# Patient Record
Sex: Female | Born: 1965 | Race: White | Hispanic: No | Marital: Married | Smoking: Never smoker
Health system: Southern US, Community
[De-identification: ages and names within clinical notes are randomized; demographics above are authoritative.]

## PROBLEM LIST (undated history)

## (undated) ENCOUNTER — Emergency Department (HOSPITAL_BASED_OUTPATIENT_CLINIC_OR_DEPARTMENT_OTHER): Admission: EM | Payer: 59 | Source: Home / Self Care

## (undated) DIAGNOSIS — R42 Dizziness and giddiness: Secondary | ICD-10-CM

## (undated) DIAGNOSIS — E785 Hyperlipidemia, unspecified: Secondary | ICD-10-CM

## (undated) DIAGNOSIS — T7840XA Allergy, unspecified, initial encounter: Secondary | ICD-10-CM

## (undated) DIAGNOSIS — R002 Palpitations: Secondary | ICD-10-CM

## (undated) DIAGNOSIS — K589 Irritable bowel syndrome without diarrhea: Secondary | ICD-10-CM

## (undated) DIAGNOSIS — I491 Atrial premature depolarization: Secondary | ICD-10-CM

## (undated) DIAGNOSIS — B019 Varicella without complication: Secondary | ICD-10-CM

## (undated) DIAGNOSIS — L309 Dermatitis, unspecified: Secondary | ICD-10-CM

## (undated) DIAGNOSIS — Z86010 Personal history of colonic polyps: Secondary | ICD-10-CM

## (undated) DIAGNOSIS — D131 Benign neoplasm of stomach: Secondary | ICD-10-CM

## (undated) DIAGNOSIS — F341 Dysthymic disorder: Secondary | ICD-10-CM

## (undated) DIAGNOSIS — I10 Essential (primary) hypertension: Secondary | ICD-10-CM

## (undated) DIAGNOSIS — K219 Gastro-esophageal reflux disease without esophagitis: Secondary | ICD-10-CM

## (undated) DIAGNOSIS — K30 Functional dyspepsia: Secondary | ICD-10-CM

## (undated) DIAGNOSIS — G4700 Insomnia, unspecified: Secondary | ICD-10-CM

## (undated) DIAGNOSIS — R7309 Other abnormal glucose: Secondary | ICD-10-CM

## (undated) DIAGNOSIS — M7700 Medial epicondylitis, unspecified elbow: Secondary | ICD-10-CM

## (undated) HISTORY — DX: Benign neoplasm of stomach: D13.1

## (undated) HISTORY — DX: Other abnormal glucose: R73.09

## (undated) HISTORY — DX: Varicella without complication: B01.9

## (undated) HISTORY — DX: Atrial premature depolarization: I49.1

## (undated) HISTORY — PX: COLONOSCOPY: SHX174

## (undated) HISTORY — DX: Essential (primary) hypertension: I10

## (undated) HISTORY — DX: Hyperlipidemia, unspecified: E78.5

## (undated) HISTORY — DX: Insomnia, unspecified: G47.00

## (undated) HISTORY — DX: Personal history of colonic polyps: Z86.010

## (undated) HISTORY — DX: Functional dyspepsia: K30

## (undated) HISTORY — DX: Gastro-esophageal reflux disease without esophagitis: K21.9

## (undated) HISTORY — DX: Palpitations: R00.2

## (undated) HISTORY — DX: Medial epicondylitis, unspecified elbow: M77.00

## (undated) HISTORY — PX: EYE SURGERY: SHX253

## (undated) HISTORY — PX: ESOPHAGOGASTRODUODENOSCOPY: SHX1529

## (undated) HISTORY — DX: Irritable bowel syndrome, unspecified: K58.9

## (undated) HISTORY — DX: Dermatitis, unspecified: L30.9

## (undated) HISTORY — DX: Dizziness and giddiness: R42

## (undated) HISTORY — DX: Allergy, unspecified, initial encounter: T78.40XA

## (undated) HISTORY — DX: Dysthymic disorder: F34.1

---

## 1998-11-02 HISTORY — PX: DE QUERVAIN'S RELEASE: SHX1439

## 2004-06-03 ENCOUNTER — Ambulatory Visit (HOSPITAL_COMMUNITY): Admission: RE | Admit: 2004-06-03 | Discharge: 2004-06-03 | Payer: Self-pay | Admitting: Cardiology

## 2004-06-03 ENCOUNTER — Encounter: Payer: Self-pay | Admitting: Cardiovascular Disease

## 2004-06-12 ENCOUNTER — Ambulatory Visit (HOSPITAL_COMMUNITY): Admission: RE | Admit: 2004-06-12 | Discharge: 2004-06-12 | Payer: Self-pay | Admitting: Physician Assistant

## 2004-09-05 ENCOUNTER — Ambulatory Visit: Payer: Self-pay | Admitting: Internal Medicine

## 2004-09-09 ENCOUNTER — Encounter: Payer: Self-pay | Admitting: Internal Medicine

## 2004-09-09 ENCOUNTER — Ambulatory Visit (HOSPITAL_COMMUNITY): Admission: RE | Admit: 2004-09-09 | Discharge: 2004-09-09 | Payer: Self-pay | Admitting: Internal Medicine

## 2005-01-24 ENCOUNTER — Ambulatory Visit (HOSPITAL_COMMUNITY): Admission: RE | Admit: 2005-01-24 | Discharge: 2005-01-24 | Payer: Self-pay | Admitting: Orthopedic Surgery

## 2005-08-04 ENCOUNTER — Ambulatory Visit (HOSPITAL_COMMUNITY): Admission: RE | Admit: 2005-08-04 | Discharge: 2005-08-04 | Payer: Self-pay | Admitting: Ophthalmology

## 2005-10-13 ENCOUNTER — Encounter: Admission: RE | Admit: 2005-10-13 | Discharge: 2005-12-01 | Payer: Self-pay | Admitting: Orthopedic Surgery

## 2006-05-16 ENCOUNTER — Emergency Department (HOSPITAL_COMMUNITY): Admission: EM | Admit: 2006-05-16 | Discharge: 2006-05-16 | Payer: Self-pay | Admitting: Family Medicine

## 2006-10-07 ENCOUNTER — Encounter: Admission: RE | Admit: 2006-10-07 | Discharge: 2006-10-07 | Payer: Self-pay | Admitting: Obstetrics and Gynecology

## 2007-04-15 ENCOUNTER — Encounter: Admission: RE | Admit: 2007-04-15 | Discharge: 2007-04-15 | Payer: Self-pay | Admitting: Occupational Medicine

## 2007-08-03 ENCOUNTER — Ambulatory Visit (HOSPITAL_COMMUNITY): Admission: RE | Admit: 2007-08-03 | Discharge: 2007-08-03 | Payer: Self-pay | Admitting: Ophthalmology

## 2007-10-08 ENCOUNTER — Emergency Department (HOSPITAL_COMMUNITY): Admission: EM | Admit: 2007-10-08 | Discharge: 2007-10-08 | Payer: Self-pay | Admitting: Emergency Medicine

## 2007-11-09 ENCOUNTER — Encounter: Admission: RE | Admit: 2007-11-09 | Discharge: 2007-11-09 | Payer: Self-pay | Admitting: Obstetrics and Gynecology

## 2007-12-05 ENCOUNTER — Emergency Department (HOSPITAL_COMMUNITY): Admission: EM | Admit: 2007-12-05 | Discharge: 2007-12-05 | Payer: Self-pay | Admitting: Family Medicine

## 2008-06-01 ENCOUNTER — Ambulatory Visit: Payer: Self-pay | Admitting: Internal Medicine

## 2008-06-04 LAB — CONVERTED CEMR LAB
ALT: 16 units/L (ref 0–35)
AST: 19 units/L (ref 0–37)
Albumin: 3.7 g/dL (ref 3.5–5.2)
Alkaline Phosphatase: 51 units/L (ref 39–117)
Amylase: 112 units/L (ref 27–131)
BUN: 12 mg/dL (ref 6–23)
Basophils Absolute: 0 10*3/uL (ref 0.0–0.1)
Basophils Relative: 0.3 % (ref 0.0–3.0)
CO2: 29 meq/L (ref 19–32)
Calcium: 9 mg/dL (ref 8.4–10.5)
Chloride: 103 meq/L (ref 96–112)
Creatinine, Ser: 0.8 mg/dL (ref 0.4–1.2)
Eosinophils Absolute: 0.1 10*3/uL (ref 0.0–0.7)
Eosinophils Relative: 1.7 % (ref 0.0–5.0)
GFR calc Af Amer: 100 mL/min
GFR calc non Af Amer: 83 mL/min
Glucose, Bld: 113 mg/dL — ABNORMAL HIGH (ref 70–99)
HCT: 40.7 % (ref 36.0–46.0)
Hemoglobin: 14 g/dL (ref 12.0–15.0)
Lipase: 35 units/L (ref 11.0–59.0)
Lymphocytes Relative: 41 % (ref 12.0–46.0)
MCHC: 34.4 g/dL (ref 30.0–36.0)
MCV: 91.3 fL (ref 78.0–100.0)
Monocytes Absolute: 0.4 10*3/uL (ref 0.1–1.0)
Monocytes Relative: 7 % (ref 3.0–12.0)
Neutro Abs: 2.9 10*3/uL (ref 1.4–7.7)
Neutrophils Relative %: 50 % (ref 43.0–77.0)
Platelets: 304 10*3/uL (ref 150–400)
Potassium: 3.7 meq/L (ref 3.5–5.1)
RBC: 4.45 M/uL (ref 3.87–5.11)
RDW: 12 % (ref 11.5–14.6)
Sodium: 138 meq/L (ref 135–145)
TSH: 0.75 microintl units/mL (ref 0.35–5.50)
Total Bilirubin: 0.5 mg/dL (ref 0.3–1.2)
Total Protein: 7 g/dL (ref 6.0–8.3)
WBC: 5.7 10*3/uL (ref 4.5–10.5)

## 2008-06-06 ENCOUNTER — Ambulatory Visit: Payer: Self-pay | Admitting: Internal Medicine

## 2008-06-06 LAB — CONVERTED CEMR LAB: IgA: 229 mg/dL (ref 68–378)

## 2008-06-13 LAB — CONVERTED CEMR LAB: Tissue Transglutaminase Ab, IgA: 0.3 units (ref <7)

## 2008-07-16 ENCOUNTER — Ambulatory Visit: Payer: Self-pay | Admitting: Internal Medicine

## 2008-07-26 ENCOUNTER — Encounter: Payer: Self-pay | Admitting: Internal Medicine

## 2008-07-26 ENCOUNTER — Ambulatory Visit (HOSPITAL_BASED_OUTPATIENT_CLINIC_OR_DEPARTMENT_OTHER): Admission: RE | Admit: 2008-07-26 | Discharge: 2008-07-26 | Payer: Self-pay | Admitting: Family Medicine

## 2008-07-27 ENCOUNTER — Encounter: Payer: Self-pay | Admitting: Internal Medicine

## 2008-08-03 ENCOUNTER — Ambulatory Visit (HOSPITAL_BASED_OUTPATIENT_CLINIC_OR_DEPARTMENT_OTHER): Admission: RE | Admit: 2008-08-03 | Discharge: 2008-08-03 | Payer: Self-pay | Admitting: Internal Medicine

## 2008-08-03 ENCOUNTER — Telehealth: Payer: Self-pay | Admitting: Internal Medicine

## 2008-08-06 ENCOUNTER — Ambulatory Visit: Payer: Self-pay | Admitting: Internal Medicine

## 2008-08-06 DIAGNOSIS — R7309 Other abnormal glucose: Secondary | ICD-10-CM | POA: Insufficient documentation

## 2008-08-06 DIAGNOSIS — E785 Hyperlipidemia, unspecified: Secondary | ICD-10-CM | POA: Insufficient documentation

## 2008-08-06 LAB — CONVERTED CEMR LAB: CA 125: 7.1 units/mL (ref 0.0–30.2)

## 2008-08-07 ENCOUNTER — Ambulatory Visit: Payer: Self-pay | Admitting: Internal Medicine

## 2008-08-07 ENCOUNTER — Telehealth: Payer: Self-pay | Admitting: Internal Medicine

## 2008-08-07 LAB — CONVERTED CEMR LAB
Cholesterol: 188 mg/dL (ref 0–200)
HDL: 44.9 mg/dL (ref >39.0)
Hgb A1c MFr Bld: 5.3 % (ref 4.6–6.0)
LDL Cholesterol: 121 mg/dL — ABNORMAL HIGH (ref 0–99)
Total CHOL/HDL Ratio: 4.2
Triglycerides: 110 mg/dL (ref 0–149)
VLDL: 22 mg/dL (ref 0–40)

## 2008-08-14 ENCOUNTER — Ambulatory Visit: Payer: Self-pay | Admitting: Internal Medicine

## 2008-08-14 DIAGNOSIS — K7689 Other specified diseases of liver: Secondary | ICD-10-CM | POA: Insufficient documentation

## 2008-08-14 DIAGNOSIS — G4700 Insomnia, unspecified: Secondary | ICD-10-CM | POA: Insufficient documentation

## 2008-08-27 LAB — CONVERTED CEMR LAB: Pap Smear: NORMAL

## 2008-09-21 ENCOUNTER — Ambulatory Visit: Payer: Self-pay | Admitting: Internal Medicine

## 2008-09-21 DIAGNOSIS — R51 Headache: Secondary | ICD-10-CM | POA: Insufficient documentation

## 2008-09-21 DIAGNOSIS — R519 Headache, unspecified: Secondary | ICD-10-CM | POA: Insufficient documentation

## 2008-09-24 ENCOUNTER — Ambulatory Visit: Payer: Self-pay | Admitting: Internal Medicine

## 2008-09-24 DIAGNOSIS — R42 Dizziness and giddiness: Secondary | ICD-10-CM | POA: Insufficient documentation

## 2008-11-27 ENCOUNTER — Ambulatory Visit: Payer: Self-pay | Admitting: Diagnostic Radiology

## 2008-11-27 ENCOUNTER — Ambulatory Visit (HOSPITAL_BASED_OUTPATIENT_CLINIC_OR_DEPARTMENT_OTHER): Admission: RE | Admit: 2008-11-27 | Discharge: 2008-11-27 | Payer: Self-pay | Admitting: Obstetrics and Gynecology

## 2008-12-21 ENCOUNTER — Ambulatory Visit: Payer: Self-pay | Admitting: *Deleted

## 2008-12-21 ENCOUNTER — Ambulatory Visit: Payer: Self-pay | Admitting: Diagnostic Radiology

## 2008-12-21 ENCOUNTER — Ambulatory Visit (HOSPITAL_BASED_OUTPATIENT_CLINIC_OR_DEPARTMENT_OTHER): Admission: RE | Admit: 2008-12-21 | Discharge: 2008-12-21 | Payer: Self-pay | Admitting: *Deleted

## 2008-12-21 DIAGNOSIS — H5310 Unspecified subjective visual disturbances: Secondary | ICD-10-CM | POA: Insufficient documentation

## 2008-12-21 LAB — CONVERTED CEMR LAB
BUN: 13 mg/dL (ref 6–23)
Basophils Absolute: 0 10*3/uL (ref 0.0–0.1)
Basophils Relative: 0.3 % (ref 0.0–3.0)
CO2: 28 meq/L (ref 19–32)
Calcium: 8.8 mg/dL (ref 8.4–10.5)
Chloride: 105 meq/L (ref 96–112)
Creatinine, Ser: 0.6 mg/dL (ref 0.4–1.2)
Eosinophils Absolute: 0.1 10*3/uL (ref 0.0–0.7)
Eosinophils Relative: 1.9 % (ref 0.0–5.0)
GFR calc Af Amer: 140 mL/min
GFR calc non Af Amer: 115 mL/min
Glucose, Bld: 157 mg/dL — ABNORMAL HIGH (ref 70–99)
HCT: 40.1 % (ref 36.0–46.0)
Hemoglobin: 13.8 g/dL (ref 12.0–15.0)
Lymphocytes Relative: 41.7 % (ref 12.0–46.0)
MCHC: 34.5 g/dL (ref 30.0–36.0)
MCV: 90.8 fL (ref 78.0–100.0)
Monocytes Absolute: 0.5 10*3/uL (ref 0.1–1.0)
Monocytes Relative: 7.8 % (ref 3.0–12.0)
Neutro Abs: 3.3 10*3/uL (ref 1.4–7.7)
Neutrophils Relative %: 48.3 % (ref 43.0–77.0)
Platelets: 260 10*3/uL (ref 150–400)
Potassium: 3.8 meq/L (ref 3.5–5.1)
RBC: 4.42 M/uL (ref 3.87–5.11)
RDW: 11.9 % (ref 11.5–14.6)
Sodium: 140 meq/L (ref 135–145)
TSH: 0.9 microintl units/mL (ref 0.35–5.50)
WBC: 6.7 10*3/uL (ref 4.5–10.5)

## 2008-12-24 ENCOUNTER — Ambulatory Visit: Payer: Self-pay | Admitting: Internal Medicine

## 2008-12-24 DIAGNOSIS — M542 Cervicalgia: Secondary | ICD-10-CM | POA: Insufficient documentation

## 2008-12-31 ENCOUNTER — Encounter: Admission: RE | Admit: 2008-12-31 | Discharge: 2009-01-23 | Payer: Self-pay | Admitting: Internal Medicine

## 2009-01-23 ENCOUNTER — Encounter: Payer: Self-pay | Admitting: Internal Medicine

## 2009-02-18 ENCOUNTER — Ambulatory Visit: Payer: Self-pay | Admitting: Internal Medicine

## 2009-02-18 LAB — CONVERTED CEMR LAB
BUN: 9 mg/dL (ref 6–23)
CO2: 30 meq/L (ref 19–32)
Calcium: 9.1 mg/dL (ref 8.4–10.5)
Chloride: 110 meq/L (ref 96–112)
Creatinine, Ser: 0.7 mg/dL (ref 0.4–1.2)
GFR calc non Af Amer: 96.11 mL/min (ref >60)
Glucose, Bld: 87 mg/dL (ref 70–99)
Hgb A1c MFr Bld: 5.5 % (ref 4.6–6.5)
Potassium: 4.4 meq/L (ref 3.5–5.1)
Sodium: 144 meq/L (ref 135–145)

## 2009-02-22 ENCOUNTER — Ambulatory Visit: Payer: Self-pay | Admitting: Internal Medicine

## 2009-06-18 ENCOUNTER — Ambulatory Visit: Payer: Self-pay | Admitting: Internal Medicine

## 2009-06-18 DIAGNOSIS — R002 Palpitations: Secondary | ICD-10-CM | POA: Insufficient documentation

## 2009-06-19 ENCOUNTER — Encounter: Payer: Self-pay | Admitting: Internal Medicine

## 2009-06-19 LAB — CONVERTED CEMR LAB
BUN: 15 mg/dL (ref 6–23)
Basophils Absolute: 0 10*3/uL (ref 0.0–0.1)
Basophils Relative: 0 % (ref 0–1)
CO2: 24 meq/L (ref 19–32)
Calcium: 9.2 mg/dL (ref 8.4–10.5)
Chloride: 104 meq/L (ref 96–112)
Creatinine, Ser: 0.82 mg/dL (ref 0.40–1.20)
Eosinophils Absolute: 0.1 10*3/uL (ref 0.0–0.7)
Eosinophils Relative: 1 % (ref 0–5)
Free T4: 0.98 ng/dL (ref 0.80–1.80)
Glucose, Bld: 93 mg/dL (ref 70–99)
HCT: 42.8 % (ref 36.0–46.0)
Hemoglobin: 13.8 g/dL (ref 12.0–15.0)
Lymphocytes Relative: 32 % (ref 12–46)
Lymphs Abs: 2.9 10*3/uL (ref 0.7–4.0)
MCHC: 32.2 g/dL (ref 30.0–36.0)
MCV: 93.4 fL (ref 78.0–100.0)
Monocytes Absolute: 0.4 10*3/uL (ref 0.1–1.0)
Monocytes Relative: 5 % (ref 3–12)
Neutro Abs: 5.5 10*3/uL (ref 1.7–7.7)
Neutrophils Relative %: 62 % (ref 43–77)
Platelets: 311 10*3/uL (ref 150–400)
Potassium: 4.1 meq/L (ref 3.5–5.3)
RBC: 4.58 M/uL (ref 3.87–5.11)
RDW: 12.7 % (ref 11.5–15.5)
Sodium: 138 meq/L (ref 135–145)
TSH: 0.948 microintl units/mL (ref 0.350–4.500)
WBC: 8.9 10*3/uL (ref 4.0–10.5)

## 2009-06-24 ENCOUNTER — Telehealth: Payer: Self-pay | Admitting: Internal Medicine

## 2009-06-28 ENCOUNTER — Ambulatory Visit: Payer: Self-pay

## 2009-06-28 ENCOUNTER — Encounter: Payer: Self-pay | Admitting: Internal Medicine

## 2009-06-30 ENCOUNTER — Telehealth: Payer: Self-pay | Admitting: Internal Medicine

## 2009-07-19 ENCOUNTER — Ambulatory Visit: Payer: Self-pay | Admitting: Internal Medicine

## 2009-07-19 DIAGNOSIS — R0789 Other chest pain: Secondary | ICD-10-CM | POA: Insufficient documentation

## 2009-08-07 ENCOUNTER — Encounter: Payer: Self-pay | Admitting: Cardiology

## 2009-08-22 ENCOUNTER — Ambulatory Visit (HOSPITAL_COMMUNITY): Admission: RE | Admit: 2009-08-22 | Discharge: 2009-08-22 | Payer: Self-pay | Admitting: Internal Medicine

## 2009-08-22 ENCOUNTER — Telehealth: Payer: Self-pay | Admitting: Internal Medicine

## 2009-09-12 ENCOUNTER — Telehealth (INDEPENDENT_AMBULATORY_CARE_PROVIDER_SITE_OTHER): Payer: Self-pay | Admitting: *Deleted

## 2009-09-13 LAB — CONVERTED CEMR LAB: Pap Smear: NORMAL

## 2009-09-18 ENCOUNTER — Encounter: Payer: Self-pay | Admitting: Cardiology

## 2009-09-18 ENCOUNTER — Ambulatory Visit: Payer: Self-pay | Admitting: Cardiology

## 2009-09-30 ENCOUNTER — Ambulatory Visit: Payer: Self-pay | Admitting: Internal Medicine

## 2009-09-30 DIAGNOSIS — F341 Dysthymic disorder: Secondary | ICD-10-CM | POA: Insufficient documentation

## 2009-10-10 ENCOUNTER — Ambulatory Visit: Payer: Self-pay

## 2009-10-10 ENCOUNTER — Ambulatory Visit (HOSPITAL_COMMUNITY): Admission: RE | Admit: 2009-10-10 | Discharge: 2009-10-10 | Payer: Self-pay | Admitting: Cardiology

## 2009-10-10 ENCOUNTER — Ambulatory Visit: Payer: Self-pay | Admitting: Cardiology

## 2009-10-10 ENCOUNTER — Encounter: Payer: Self-pay | Admitting: Cardiology

## 2009-10-14 ENCOUNTER — Telehealth (INDEPENDENT_AMBULATORY_CARE_PROVIDER_SITE_OTHER): Payer: Self-pay | Admitting: *Deleted

## 2009-11-20 ENCOUNTER — Encounter: Payer: Self-pay | Admitting: Cardiology

## 2009-11-20 ENCOUNTER — Ambulatory Visit: Payer: Self-pay | Admitting: Cardiology

## 2009-11-20 DIAGNOSIS — R0602 Shortness of breath: Secondary | ICD-10-CM | POA: Insufficient documentation

## 2009-11-21 ENCOUNTER — Ambulatory Visit: Payer: Self-pay | Admitting: Family Medicine

## 2009-11-25 ENCOUNTER — Ambulatory Visit: Payer: Self-pay | Admitting: Internal Medicine

## 2009-11-26 ENCOUNTER — Telehealth: Payer: Self-pay | Admitting: Internal Medicine

## 2009-12-05 ENCOUNTER — Encounter: Admission: RE | Admit: 2009-12-05 | Discharge: 2010-01-28 | Payer: Self-pay | Admitting: Internal Medicine

## 2009-12-09 ENCOUNTER — Encounter: Payer: Self-pay | Admitting: Internal Medicine

## 2009-12-27 ENCOUNTER — Ambulatory Visit: Payer: Self-pay | Admitting: Radiology

## 2009-12-27 ENCOUNTER — Ambulatory Visit (HOSPITAL_BASED_OUTPATIENT_CLINIC_OR_DEPARTMENT_OTHER): Admission: RE | Admit: 2009-12-27 | Discharge: 2009-12-27 | Payer: Self-pay | Admitting: Obstetrics and Gynecology

## 2009-12-27 LAB — HM MAMMOGRAPHY: HM Mammogram: NEGATIVE

## 2010-02-03 ENCOUNTER — Telehealth: Payer: Self-pay | Admitting: Internal Medicine

## 2010-03-22 ENCOUNTER — Ambulatory Visit: Payer: Self-pay | Admitting: Family Medicine

## 2010-03-22 LAB — CONVERTED CEMR LAB: Rapid Strep: NEGATIVE

## 2010-03-26 ENCOUNTER — Telehealth (INDEPENDENT_AMBULATORY_CARE_PROVIDER_SITE_OTHER): Payer: Self-pay | Admitting: *Deleted

## 2010-03-27 ENCOUNTER — Ambulatory Visit: Payer: Self-pay | Admitting: Internal Medicine

## 2010-05-06 ENCOUNTER — Encounter: Payer: Self-pay | Admitting: Internal Medicine

## 2010-05-20 ENCOUNTER — Ambulatory Visit: Payer: Self-pay | Admitting: Internal Medicine

## 2010-05-21 ENCOUNTER — Telehealth: Payer: Self-pay | Admitting: Internal Medicine

## 2010-05-21 ENCOUNTER — Encounter: Payer: Self-pay | Admitting: Gastroenterology

## 2010-05-26 ENCOUNTER — Encounter: Payer: Self-pay | Admitting: Internal Medicine

## 2010-05-27 ENCOUNTER — Ambulatory Visit (HOSPITAL_COMMUNITY): Admission: RE | Admit: 2010-05-27 | Discharge: 2010-05-27 | Payer: Self-pay | Admitting: Internal Medicine

## 2010-05-27 ENCOUNTER — Telehealth: Payer: Self-pay | Admitting: Internal Medicine

## 2010-06-03 ENCOUNTER — Telehealth: Payer: Self-pay | Admitting: Internal Medicine

## 2010-06-06 ENCOUNTER — Ambulatory Visit (HOSPITAL_COMMUNITY): Admission: RE | Admit: 2010-06-06 | Discharge: 2010-06-06 | Payer: Self-pay | Admitting: Internal Medicine

## 2010-06-09 ENCOUNTER — Telehealth: Payer: Self-pay | Admitting: Internal Medicine

## 2010-06-11 ENCOUNTER — Encounter: Payer: Self-pay | Admitting: Internal Medicine

## 2010-06-11 LAB — CONVERTED CEMR LAB
ALT: 14 units/L (ref 0–35)
AST: 14 units/L (ref 0–37)
Albumin: 4.2 g/dL (ref 3.5–5.2)
Alkaline Phosphatase: 56 units/L (ref 39–117)
BUN: 12 mg/dL (ref 6–23)
Basophils Absolute: 0 10*3/uL (ref 0.0–0.1)
Basophils Relative: 0 % (ref 0–1)
Bilirubin, Direct: 0.1 mg/dL (ref 0.0–0.3)
CO2: 21 meq/L (ref 19–32)
Calcium: 9 mg/dL (ref 8.4–10.5)
Chloride: 107 meq/L (ref 96–112)
Cholesterol: 187 mg/dL (ref 0–200)
Creatinine, Ser: 0.69 mg/dL (ref 0.40–1.20)
Eosinophils Absolute: 0.1 10*3/uL (ref 0.0–0.7)
Eosinophils Relative: 1 % (ref 0–5)
Glucose, Bld: 84 mg/dL (ref 70–99)
HCT: 43.3 % (ref 36.0–46.0)
HDL: 52 mg/dL (ref >39)
Hemoglobin: 14.1 g/dL (ref 12.0–15.0)
Indirect Bilirubin: 0.3 mg/dL (ref 0.0–0.9)
LDL Cholesterol: 113 mg/dL — ABNORMAL HIGH (ref 0–99)
Lymphocytes Relative: 31 % (ref 12–46)
Lymphs Abs: 2.6 10*3/uL (ref 0.7–4.0)
MCHC: 32.6 g/dL (ref 30.0–36.0)
MCV: 91.7 fL (ref 78.0–100.0)
Monocytes Absolute: 0.5 10*3/uL (ref 0.1–1.0)
Monocytes Relative: 6 % (ref 3–12)
Neutro Abs: 5.1 10*3/uL (ref 1.7–7.7)
Neutrophils Relative %: 62 % (ref 43–77)
Platelets: 283 10*3/uL (ref 150–400)
Potassium: 4 meq/L (ref 3.5–5.3)
RBC: 4.72 M/uL (ref 3.87–5.11)
RDW: 12.9 % (ref 11.5–15.5)
Sodium: 140 meq/L (ref 135–145)
TSH: 0.887 microintl units/mL (ref 0.350–4.500)
Total Bilirubin: 0.4 mg/dL (ref 0.3–1.2)
Total CHOL/HDL Ratio: 3.6
Total Protein: 7 g/dL (ref 6.0–8.3)
Triglycerides: 109 mg/dL (ref <150)
VLDL: 22 mg/dL (ref 0–40)
WBC: 8.2 10*3/uL (ref 4.0–10.5)

## 2010-06-24 ENCOUNTER — Ambulatory Visit: Payer: Self-pay | Admitting: Internal Medicine

## 2010-06-24 DIAGNOSIS — M5430 Sciatica, unspecified side: Secondary | ICD-10-CM | POA: Insufficient documentation

## 2010-07-15 ENCOUNTER — Ambulatory Visit: Payer: Self-pay | Admitting: Internal Medicine

## 2010-07-15 DIAGNOSIS — M76899 Other specified enthesopathies of unspecified lower limb, excluding foot: Secondary | ICD-10-CM | POA: Insufficient documentation

## 2010-07-17 ENCOUNTER — Ambulatory Visit: Payer: Self-pay | Admitting: Family Medicine

## 2010-07-17 DIAGNOSIS — M7700 Medial epicondylitis, unspecified elbow: Secondary | ICD-10-CM

## 2010-07-17 HISTORY — DX: Medial epicondylitis, unspecified elbow: M77.00

## 2010-08-12 ENCOUNTER — Telehealth: Payer: Self-pay | Admitting: Internal Medicine

## 2010-08-12 ENCOUNTER — Ambulatory Visit (HOSPITAL_COMMUNITY): Admission: RE | Admit: 2010-08-12 | Discharge: 2010-08-12 | Payer: Self-pay | Admitting: Internal Medicine

## 2010-09-18 ENCOUNTER — Encounter: Payer: Self-pay | Admitting: Family Medicine

## 2010-11-23 ENCOUNTER — Encounter: Payer: Self-pay | Admitting: Internal Medicine

## 2010-11-24 ENCOUNTER — Encounter: Payer: Self-pay | Admitting: Internal Medicine

## 2010-11-30 LAB — CONVERTED CEMR LAB
ALT: 13 units/L (ref 0–35)
AST: 17 units/L (ref 0–37)
Albumin: 4.4 g/dL (ref 3.5–5.2)
Alkaline Phosphatase: 61 units/L (ref 39–117)
BUN: 10 mg/dL (ref 6–23)
Basophils Absolute: 0 10*3/uL (ref 0.0–0.1)
Basophils Relative: 0 % (ref 0–1)
Bilirubin, Direct: <0.1 mg/dL (ref 0.0–0.3)
CO2: 27 meq/L (ref 19–32)
Calcium: 8.9 mg/dL (ref 8.4–10.5)
Chloride: 103 meq/L (ref 96–112)
Creatinine, Ser: 0.75 mg/dL (ref 0.40–1.20)
Eosinophils Absolute: 0.1 10*3/uL (ref 0.0–0.7)
Eosinophils Relative: 2 % (ref 0–5)
Glucose, Bld: 128 mg/dL — ABNORMAL HIGH (ref 70–99)
HCT: 42.8 % (ref 36.0–46.0)
Hemoglobin: 14.1 g/dL (ref 12.0–15.0)
Lipase: 39 units/L (ref 0–75)
Lymphocytes Relative: 43 % (ref 12–46)
Lymphs Abs: 3 10*3/uL (ref 0.7–4.0)
MCHC: 32.9 g/dL (ref 30.0–36.0)
MCV: 93.7 fL (ref 78.0–100.0)
Monocytes Absolute: 0.4 10*3/uL (ref 0.1–1.0)
Monocytes Relative: 6 % (ref 3–12)
Neutro Abs: 3.4 10*3/uL (ref 1.7–7.7)
Neutrophils Relative %: 49 % (ref 43–77)
Platelets: 321 10*3/uL (ref 150–400)
Potassium: 4 meq/L (ref 3.5–5.3)
RBC: 4.57 M/uL (ref 3.87–5.11)
RDW: 12.9 % (ref 11.5–15.5)
Sodium: 139 meq/L (ref 135–145)
Total Bilirubin: 0.3 mg/dL (ref 0.3–1.2)
Total Protein: 7.7 g/dL (ref 6.0–8.3)
WBC: 6.9 10*3/uL (ref 4.0–10.5)

## 2010-12-03 NOTE — Assessment & Plan Note (Signed)
Summary: Sorethroat x 2 dys rm 2   Vital Signs:  Patient Profile:   45 Years Old Female CC:      Cold & URI symptoms Height:     61 inches Weight:      137 pounds O2 Sat:      100 % O2 treatment:    Room Air Temp:     99.3 degrees F oral Pulse rate:   88 / minute Pulse rhythm:   regular Resp:     16 per minute BP sitting:   144 / 92  (right arm) Cuff size:   regular  Vitals Entered By: Areta Haber CMA (Mar 22, 2010 5:11 PM)                  Prior Medication List:  MULTIVITAMINS   TABS (MULTIPLE VITAMIN) once daily NUVARING 0.12-0.015 MG/24HR  RING (ETONOGESTREL-ETHINYL ESTRADIOL) as directed CALCIUM-VITAMIN D 500-200 MG-UNIT TABS (CALCIUM CARBONATE-VITAMIN D) Take 1 tablet by mouth once a day NEXIUM 40 MG CPDR (ESOMEPRAZOLE MAGNESIUM) one by mouth once daily HYOMAX-SR 0.375 MG XR12H-TAB (HYOSCYAMINE SULFATE) one po bid as needed AMITRIPTYLINE HCL 25 MG TABS (AMITRIPTYLINE HCL) 1/2 to one tab by mouth at bedtime prn AMOXICILLIN 875 MG TABS (AMOXICILLIN) 1 by mouth TID XIFAXAN 550 MG TABS (RIFAXIMIN) one by mouth two times a day   Current Allergies (reviewed today): ! SULFA      History of Present Illness Chief Complaint: Cold & URI symptoms History of Present Illness: Sore throat for 2 days now w/nasal congestion . She felt worse today after taking a nap and saw some white spots on her tonsil and came in to be evaluated.   Current Problems: UPPER RESPIRATORY INFECTION, ACUTE (ICD-465.9) ACUTE NASOPHARYNGITIS (ICD-460) DYSPNEA (ICD-786.05) DYSTHYMIA (ICD-300.4) HIP PAIN, LEFT (ICD-719.45) HYPERLIPIDEMIA (ICD-272.4) CHEST PAIN, ATYPICAL (ICD-786.59) PALPITATIONS (ICD-785.1) NECK PAIN (ICD-723.1) ELEVATED BP READING WITHOUT DX HYPERTENSION (ICD-796.2) UNSPECIFIED SUBJECTIVE VISUAL DISTURBANCE (ICD-368.10) ENCOUNTER FOR LONG-TERM USE OF OTHER MEDICATIONS (ICD-V58.69) DIZZINESS (ICD-780.4) HEADACHE (ICD-784.0) INSOMNIA (ICD-780.52) HEPATIC CYST  (ICD-573.8) OTHER ABNORMAL GLUCOSE (ICD-790.29) GERD (ICD-530.81) HEALTH MAINTENANCE EXAM (ICD-V70.0) ABDOMINAL PAIN, UNSPECIFIED SITE (ICD-789.00) IRRITABLE BOWEL SYNDROME (ICD-564.1) ABDOMINAL BLOATING (ICD-787.3) ABDOMINAL PAIN, EPIGASTRIC (ICD-789.06) HIATAL HERNIA (ICD-553.3)   Current Meds MULTIVITAMINS   TABS (MULTIPLE VITAMIN) once daily NUVARING 0.12-0.015 MG/24HR  RING (ETONOGESTREL-ETHINYL ESTRADIOL) as directed NEXIUM 40 MG CPDR (ESOMEPRAZOLE MAGNESIUM) one by mouth once daily AMITRIPTYLINE HCL 25 MG TABS (AMITRIPTYLINE HCL) 1/2 to one tab by mouth at bedtime prn ADVIL 200 MG TABS (IBUPROFEN) as needed TYLENOL 325 MG TABS (ACETAMINOPHEN) as needed  REVIEW OF SYSTEMS Constitutional Symptoms      Denies fever, chills, night sweats, weight loss, weight gain, and fatigue.  Eyes       Denies change in vision, eye pain, eye discharge, glasses, contact lenses, and eye surgery. Ear/Nose/Throat/Mouth       Complains of sinus problems and sore throat.      Denies hearing loss/aids, change in hearing, ear pain, ear discharge, dizziness, frequent runny nose, frequent nose bleeds, hoarseness, and tooth pain or bleeding.      Comments: x 2 dys  Respiratory       Denies dry cough, productive cough, wheezing, shortness of breath, asthma, bronchitis, and emphysema/COPD.  Cardiovascular       Denies murmurs, chest pain, and tires easily with exhertion.    Gastrointestinal       Denies stomach pain, nausea/vomiting, diarrhea, constipation, blood in bowel movements, and indigestion. Genitourniary  Denies painful urination, kidney stones, and loss of urinary control. Neurological       Denies paralysis, seizures, and fainting/blackouts. Musculoskeletal       Denies muscle pain, joint pain, joint stiffness, decreased range of motion, redness, swelling, muscle weakness, and gout.  Skin       Denies bruising, unusual mles/lumps or sores, and hair/skin or nail changes.  Psych        Denies mood changes, temper/anger issues, anxiety/stress, speech problems, depression, and sleep problems. Other Comments: pt has not seen PCP for this.   Past History:  Past Medical History: Last updated: 11/25/2009 Current Problems:  INSOMNIA (ICD-780.52) HEPATIC CYST (ICD-573.8) GERD (ICD-530.81)  IRRITABLE BOWEL SYNDROME (ICD-564.1)  HIATAL HERNIA (ICD-553.3) Tiny hypodensity left hepatic lobe - CT of abd and Pelvis 07/26/2008    Small 8mm periumblical hernia        Past Surgical History: Last updated: 11/25/2009 C-section -2002 DeQuervain Surgery 2000          Family History: Last updated: 11/25/2009 Family History of Prostate Cancer:Father deceased Family History of Diabetes: Father, Both Grandmothers, 2 Sisters Family History of Heart Disease: Father (MI 2), Grandmother GU cancer - no Colon ca - uncle (died late 87's)       Social History: Last updated: 09/30/2009 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  Alcohol Use - yes-wine       Full Time  Married   Risk Factors: Alcohol Use: 0 (06/18/2009) Caffeine Use: 1 beverage daily (06/18/2009) Exercise: yes (06/18/2009)  Risk Factors: Smoking Status: never (09/30/2009)  Family History: Reviewed history from 11/25/2009 and no changes required. Family History of Prostate Cancer:Father deceased Family History of Diabetes: Father, Both Grandmothers, 2 Sisters Family History of Heart Disease: Father (MI 50), Grandmother GU cancer - no Colon ca - uncle (died late 67's)       Social History: Reviewed history from 09/30/2009 and no changes required. 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  Alcohol Use - yes-wine       Full Time  Married  Physical Exam General appearance: well developed, well nourished, no acute distress Head: normocephalic, atraumatic Ears: normal, no lesions or deformities Nasal: mucosa pink, nonedematous, no septal deviation, turbinates  normal Oral/Pharynx: pharyngeal erythema with concretions in her tonsilar bed, uvula midline without deviation Neck: supple,anterior lymphadenopathy present Extremities: normal extremities Skin: no obvious rashes or lesions MSE: oriented to time, place, and person Assessment New Problems: UPPER RESPIRATORY INFECTION, ACUTE (ICD-465.9) ACUTE NASOPHARYNGITIS (ICD-460)  URI  viral illness  Patient Education: Patient and/or caregiver instructed in the following: rest fluids and Tylenol.  Plan New Orders: Est. Patient Level III [99213] Rapid Strep [87880] T-Culture, Rapid Strep [40981-19147] Follow Up: Follow up in 2-3 days if no improvement, Follow up with Primary Physician  The patient and/or caregiver has been counseled thoroughly with regard to medications prescribed including dosage, schedule, interactions, rationale for use, and possible side effects and they verbalize understanding.  Diagnoses and expected course of recovery discussed and will return if not improved as expected or if the condition worsens. Patient and/or caregiver verbalized understanding.   Patient Instructions: 1)  Folllow up W/PCP if not better by Wednesday. 2)  Recommend allegra -d or zyrtec -D for the time being  and gargle w/salt water . Culture was obtained as well and will notify if positive.  Laboratory Results  Date/Time Received: Mar 22, 2010 5:39 PM  Date/Time Reported: Mar 22, 2010 5:39 PM  Other Tests  Rapid Strep: negative  Kit Test Internal QC: Negative   (Normal Range: Negative)

## 2010-12-03 NOTE — Miscellaneous (Signed)
Summary: Levbid  Clinical Lists Changes  Medications: Rx of LEVBID 0.375 MG XR12H-TAB (HYOSCYAMINE SULFATE) one by mouth q12 hrs prn;  #60 x 2;  Signed;  Entered by: Glendell Docker CMA;  Authorized by: D. Thomos Lemons DO;  Method used: Electronically to [facility]*, [address], [address] Shipping/mailing, [place], [place]  [address], Ph: [phone], Fax: [phone]    Prescriptions: LEVBID 0.375 MG XR12H-TAB (HYOSCYAMINE SULFATE) one by mouth q12 hrs prn  #60 x 2   Entered by:   Glendell Docker CMA   Authorized by:   D. Thomos Lemons DO   Signed by:   Glendell Docker CMA on 07/27/2008   Method used:   Electronically to        [facility]* (retail)       [address]       [address] Shipping/mailing       [place], [place]  [address]       Ph: [phone]       Fax: [phone]   RxID:   [id number]  patient requested rx sent to [facility] Glendell Docker CMA  July 27, 2008 3:38 PM

## 2010-12-03 NOTE — Assessment & Plan Note (Signed)
Summary: f/u from dr Andrey Campanile on 2-19   Vital Signs:  Patient Profile:   45 Years Old Female Height:     61 inches Weight:      134 pounds BMI:     25.41 Temp:     98.4 degrees F oral Pulse rate:   90 / minute Pulse rhythm:   regular Resp:     18 per minute BP sitting:   126 / 80  (right arm) Cuff size:   regular  Vitals Entered By: Glendell Docker CMA (December 24, 2008 11:59 AM)                 PCP:  [name]  Chief Complaint:  c/o head and neck pain.  History of Present Illness: 45 year old white female for follow-up regarding headaches.  Patient appears a seen by Dr. Andrey Campanile.  The patient her pattern was different than her usual symptoms. She experienced some dizziness.   CT of the head was obtained.  It was negative for acute abnormality.  Patient reports some improvement with NSAIDs.  She notes chronic left-sided neck discomfort.  She reports previous negative C-spine x-ray.  No history of injury or trauma.    Current Allergies: ! 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     Alcohol Use - yes-wine      Risk Factors:  Mammogram History:     Date of Last Mammogram:  11/19/2008    Results:  normal     Physical Exam  General:     alert, well-developed, and well-nourished.   Head:     normocephalic and atraumatic.   Eyes:     vision grossly intact, pupils equal, pupils round, and pupils  reactive to light.   Ears:     R ear normal and L ear normal.   Neck:     supple.  left sided discomfort.  discomfor with c spine side bending and rotation Lungs:     normal respiratory effort and normal breath sounds.   Heart:     normal rate, regular rhythm, and no gallop.   Neurologic:     cranial nerves II-XII intact, gait normal,     Impression & Recommendations:  Problem # 1:  NECK PAIN (ICD-723.1) Pt with intermittent headaches.   Her HA may be triggered by chronic neck pain.   Pt reports prev C Spine xray normal.    No symptoms of radiculopathy.   Trial of PT. The following medications were removed from the medication list:    Ibuprofen 800 Mg Tabs (Ibuprofen) .Marland Kitchen... 1 tab by mouth three times daily  Her updated medication list for this problem includes:    Skelaxin 800 Mg Tabs (Metaxalone) .Marland KitchenMarland KitchenMarland KitchenMarland Kitchen  One by mouth three times a day prn  Orders: Physical Therapy Referral (PT)   Problem # 2:  HEADACHE (ICD-784.0) Pt with persistent headache.   I suspect tension migraine.   Recent CT of head normal.  If no improvement with PT of c spine, we discussed referral to headache specialist.  Muscle relaxant as needed. The following medications were removed from the medication list:    Ibuprofen 800 Mg Tabs (Ibuprofen) .Marland Kitchen... 1 tab by mouth three times daily   Problem # 3:  OTHER ABNORMAL GLUCOSE (ICD-790.29) Recent CBG 157 and (non fasting).    Monitor A1c.   We discussed avoiding sweets and decreasing carb intake.   She exercises occasionally. Labs Reviewed: HgBA1c: 5.3 (08/07/2008)   Creat: 0.6 (12/21/2008)      Complete Medication List: 1)  Nexium 40 Mg Cpdr (Esomeprazole magnesium) .... Once daily 2)  Multivitamins Tabs (Multiple vitamin) .... Once daily 3)  Nuvaring 0.12-0.015 Mg/24hr Ring (Etonogestrel-ethinyl estradiol) .... As directed 4)  Hyoscyamine Sulfate 0.125 Mg Tabs (Hyoscyamine sulfate) .... One by mouth q4 hrs as needed. 5)  Promethazine Hcl 12.5 Mg Tabs  (Promethazine hcl) .... One by mouth three times a day prn 6)  Diazepam 2 Mg Tabs (Diazepam) .... 1/2 to one tab by mouth bid 7)  Skelaxin 800 Mg Tabs (Metaxalone) .... One by mouth three times a day prn   Patient Instructions: 1)  Please schedule a follow-up appointment in 2 months. 2)  BMP prior to visit, ICD-9: 790.29 3)  HbgA1C prior to visit, ICD-9:  790.29 4)  Please return for lab work one (1) week before your next appointment.    Prescriptions: SKELAXIN 800 MG TABS (METAXALONE) one by mouth three times a day prn  #30 x 1   Entered and Authorized by:   D. Thomos Lemons DO   Signed by:   D. Thomos Lemons DO on 12/24/2008   Method used:   Faxed to ...       [facility]  [address]  [phone] (retail)       [address]       [place], [place]  [address]       Ph: [phone] or [phone]       Fax: [phone]   RxID:   [id number]    Preventive Care Screening  Mammogram:    Date:  11/19/2008    Results:  normal

## 2010-12-03 NOTE — Progress Notes (Signed)
Summary: Nexium Refill  Phone Note Refill Request Message from:  Fax from Pharmacy on February 03, 2010 3:50 PM  Refills Requested: Medication #1:  NEXIUM 40 MG CPDR one by mouth once daily   Dosage confirmed as above?Dosage Confirmed   Brand Name Necessary? No   Supply Requested: 3 months   Last Refilled: 09/03/2009 [facility] [id number] [place] [phone]   Method Requested: Electronic Next Appointment Scheduled: none Initial call taken by: Roselle Locus,  February 03, 2010 3:51 PM  Follow-up for Phone Call        Rx completed in Dr. Tiajuana Amass Follow-up by: Glendell Docker CMA,  February 04, 2010 8:17 AM    Prescriptions: NEXIUM 40 MG CPDR (ESOMEPRAZOLE MAGNESIUM) one by mouth once daily  #30 x 5   Entered by:   Glendell Docker CMA   Authorized by:   D. Thomos Lemons DO   Signed by:   Glendell Docker CMA on 02/04/2010   Method used:   Electronically to        [facility]* (retail)       [address]       [address] [place] [address]ing       [place], [place]  [address]       Ph: [phone]       Fax: [phone]   RxID:   [id number]

## 2010-12-03 NOTE — Progress Notes (Signed)
Summary: Test results  Phone Note Outgoing Call   Summary of Call: call pt - abd u/s neg for gallbladder disease Initial call taken by: D. Thomos Lemons DO,  August 03, 2008 10:48 AM  Follow-up for Phone Call        patient advised per Dr Artist Pais instructions Follow-up by: Glendell Docker CMA,  August 06, 2008 9:52 AM

## 2010-12-03 NOTE — Op Note (Signed)
Summary: Office Procedure Consent   Office Procedure Consent   Imported By: Darius Bump 09/18/2010 10:47:07  _____________________________________________________________________  External Attachment:    Type:   Image     Comment:   External Document

## 2010-12-03 NOTE — Assessment & Plan Note (Signed)
Summary: f/u/hea   Vital Signs:  Patient profile:   45 year old female Weight:      134.50 pounds BMI:     25.51 Temp:     98.5 degrees F oral Pulse rate:   100 / minute Pulse rhythm:   regular Resp:     16 per minute BP sitting:   112 / 80  (right arm) Cuff size:   regular  Vitals Entered By: Glendell Docker CMA (February 22, 2009 4:00 PM)  Primary Care Provider:  [name]  CC:  Follow up on Blood Work.  History of Present Illness: 45 y/o white female for follow up.   Pt tried PT.   She notes some improvement.   When she first started - she noted increased neck pain.   She uses motrin and skelaxin intermittently.    She notes tender area left side of occiput.   She denies headache but "scalp or head pain".  Hyperglycemia - repeat BMET and A1c normal.  Wt is stable.  Allergies: 1)  ! 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     Alcohol Use - yes-wine      Physical Exam  General:  alert, well-developed, and well-nourished.   Head:  normocephalic and atraumatic.   Neck:  supple and full ROM.   Lungs:  normal respiratory effort and normal breath sounds.   Heart:  normal rate, regular rhythm, and no gallop.   Extremities:  No lower extremity edema  Neurologic:  cranial nerves II-XII intact and gait normal.     Impression & Recommendations:  Problem # 1:  NECK PAIN (ICD-723.1) 45 y/o with intemittent left sided neck pain.   She may have occipital neuralgia.   Trial of low dose gabapentin.    Her updated medication list for this problem includes:    Skelaxin 800 Mg Tabs (Metaxalone) ..... One by mouth three times a day prn  Complete Medication  List: 1)  Nexium 40 Mg Cpdr (Esomeprazole magnesium) .... Take 1 tablet by mouth once a day 2)  Multivitamins Tabs (Multiple vitamin) .... Once daily 3)  Nuvaring 0.12-0.015 Mg/24hr Ring (Etonogestrel-ethinyl estradiol) .... As directed 4)  Hyoscyamine Sulfate 0.125 Mg Tabs (Hyoscyamine sulfate) .... One by mouth q4 hrs as needed. 5)  Promethazine Hcl 12.5 Mg Tabs (Promethazine hcl) .... One by mouth three times a day prn 6)  Diazepam 2 Mg Tabs (Diazepam) .... 1/2 to one tab by mouth bid 7)  Skelaxin 800 Mg Tabs (Metaxalone) .... One by mouth three times a day prn 8)  Gabapentin 100 Mg Caps (Gabapentin) .... One by mouth at bedtime prn  Patient Instructions: 1)  Please schedule a follow-up appointment in 6 months - 1year. Prescriptions: GABAPENTIN 100 MG CAPS (GABAPENTIN) one by mouth at bedtime prn  #30 x 1   Entered and Authorized by:   D. Thomos Lemons DO   Signed by:   D. Thomos Lemons DO on 02/22/2009   Method used:   Print then  Give to Patient   RxID:   [id number]       Current Allergies (reviewed today): ! SULFA

## 2010-12-03 NOTE — Assessment & Plan Note (Signed)
Summary: cpx/mhf   Vital Signs:  Patient profile:   45 year old female Height:      61 inches Weight:      140 pounds BMI:     26.55 O2 Sat:      99 % on Room air Temp:     98.4 degrees F oral Pulse rate:   93 / minute Pulse rhythm:   regular Resp:     16 per minute BP sitting:   124 / 80  (left arm) Cuff size:   regular  Vitals Entered By: Glendell Docker CMA (June 24, 2010 2:51 PM)  O2 Flow:  Room air CC: CPX Is Patient Diabetic? No Pain Assessment Patient in pain? no       Does patient need assistance? Functional Status Self care Ambulation Normal Comments appotintment with Dr Leone Payor next mont hfor endoscopy , and questions about having a bone density, left buttock ,and back of leg numbness for the past  several weeks   Primary Care Provider:  [name]  CC:  CPX.  History of Present Illness: 45 y/o white female for routine cpx  pt reports symptoms of left sided sciatica. no injury or trauma symptoms worse with prolonged sitting  Preventive Screening-Counseling & Management  Alcohol-Tobacco     Alcohol drinks/day: <1     Smoking Status: never  Caffeine-Diet-Exercise     Caffeine use/day: 1-2 beverages daily     Does Patient Exercise: yes     Times/week: 5  Allergies: 1)  ! Sulfa  Past History:  Past Medical History:  INSOMNIA (ICD-780.52) HEPATIC CYST (ICD-573.8)   GERD (ICD-530.81)   IRRITABLE BOWEL SYNDROME (ICD-564.1)  HIATAL HERNIA (ICD-553.3)  Tiny hypodensity left hepatic lobe - CT of abd and Pelvis 07/26/2008    Small 8mm periumblical hernia        Past Surgical History: C-section -2002 DeQuervain Surgery 2000             Social History: Caffeine use/day:  1-2 beverages daily  Review of Systems  The patient denies fever, weight loss, weight gain, chest pain, syncope, dyspnea on exertion, prolonged cough, muscle weakness, and breast masses.    Physical Exam  General:  alert, well-developed, and well-nourished.     Head:  normocephalic and atraumatic.   Ears:  R ear normal and L ear normal.   Mouth:  good dentition and pharynx pink and moist.   Neck:  supple and no masses.   Lungs:  normal respiratory effort and normal breath sounds.   Heart:  normal rate, regular rhythm, no murmur, and no gallop.   Abdomen:  soft, non-tender, normal bowel sounds, no hepatomegaly, and no splenomegaly.   Msk:  neg straight leg raise test.  left piriformis tender Extremities:  No lower extremity edema Neurologic:  cranial nerves II-XII intact and gait normal.   Skin:  scattered freckles Psych:  normally interactive, good eye contact, not anxious appearing, and not depressed appearing.     Impression & Recommendations:  Problem # 1:  HEALTH MAINTENANCE EXAM (ICD-V70.0) Reviewed adult health maintenance protocols.  Mammogram: ASSESSMENT: Negative - BI-RADS 1^MM DIGITAL SCREENING (12/27/2009) Pap smear: normal (09/13/2009) Td Booster: given (01/04/2006)   Flu Vax: Historical (08/20/2009)   Chol: 187 (06/11/2010)   HDL: 52 (06/11/2010)   LDL: 113 (06/11/2010)   TG: 109 (06/11/2010) TSH: 0.887 (06/11/2010)   HgbA1C: 5.5 (02/18/2009)     Problem # 2:  SCIATICA, LEFT (ICD-724.3) reviewed stretching exercises.  if symtoms get  worse, we discussed ortho referral Her updated medication list for this problem includes:    Advil 200 Mg Tabs (Ibuprofen) .Marland Kitchen... As needed    Tylenol 325 Mg Tabs (Acetaminophen) .Marland Kitchen... As needed  Complete Medication List: 1)  Multivitamins Tabs (Multiple vitamin) .... Once daily 2)  Nuvaring 0.12-0.015 Mg/24hr Ring (Etonogestrel-ethinyl estradiol) .... As directed 3)  Nexium 40 Mg Cpdr (Esomeprazole magnesium) .... One by mouth by mouth once daily 4)  Amitriptyline Hcl 25 Mg Tabs (Amitriptyline hcl) .... 1/2 to one tab by mouth at bedtime prn 5)  Advil 200 Mg Tabs (Ibuprofen) .... As needed 6)  Tylenol 325 Mg Tabs (Acetaminophen) .... As needed 7)  Clidinium-chlordiazepoxide 2.5-5 Mg Caps  (Clidinium-chlordiazepoxide) .... One by mouth two times a day as needed for abdominal pain  Current Allergies (reviewed today): ! SULFA

## 2010-12-03 NOTE — Progress Notes (Signed)
Summary: Lab  Results  Phone Note Outgoing Call   Summary of Call: call pt - electrolytes, CBC and thyroid studies are normal.  mail copy of labs to pt Initial call taken by: D. Thomos Lemons DO,  June 24, 2009 12:01 PM  Follow-up for Phone Call        attempted to contact patient at [phone] husband states patient is not available, message left to have patient return call Follow-up by: Glendell Docker CMA,  June 24, 2009 4:40 PM  Additional Follow-up for Phone Call Additional follow up Details #1::        Patient called stated she was returning Darlene's call, i informed pt that her labs were normal and that we are mailing her a copy of her results.   Additional Follow-up by: Eliezer Lofts,  June 24, 2009 4:43 PM

## 2010-12-03 NOTE — Assessment & Plan Note (Signed)
Summary: 2 MONTH FOLLOWUP/MHF   Vital Signs:  Patient profile:   44 year old female Weight:      137 pounds BMI:     25.98 O2 Sat:      100 % on Room air Temp:     98.2 degrees F oral Pulse rate:   93 / minute Pulse rhythm:   regular Resp:     16 per minute BP sitting:   122 / 90  (left arm) Cuff size:   regular  Vitals Entered By: Glendell Docker CMA (November 25, 2009 3:59 PM)  O2 Flow:  Room air CC: 2 Month Follow up   Primary Care Provider:  [name]  CC:  2 Month Follow up.  History of Present Illness: 2 Month Follow up  45 y/o white female for follow up.  cardiac w/u for palpitation reviewed.  IBS - she still has post prandial bloating and loose stools.  she has been using nexium and hyomax as needed.  she is reconsidering EGD.  she will contact Dr. Leone Payor  Pt c/o sweating more.  her gyn tried metrogel to axilla.  no improvement  chronic neck - still comes and goes. mainly right sided.  better with ibuprofen but she does not want to take NSAIDs long term  left hip pain - bursitis better with injection.  still has deeper left hip pain esp in AM.  no pain with wt bearing or activity  Allergies: 1)  ! Sulfa  Past History:  Past Medical History: Current Problems:  INSOMNIA (ICD-780.52) HEPATIC CYST (ICD-573.8) GERD (ICD-530.81)  IRRITABLE BOWEL SYNDROME (ICD-564.1)  HIATAL HERNIA (ICD-553.3) Tiny hypodensity left hepatic lobe - CT of abd and Pelvis 07/26/2008    Small 8mm periumblical hernia        Past Surgical History: C-section -2002 DeQuervain Surgery 2000          Family History: Family History of Prostate Cancer:Father deceased Family History of Diabetes: Father, Both Grandmothers, 2 Sisters Family History of Heart Disease: Father (MI 64), Grandmother GU cancer - no Colon ca - uncle (died late 68's)       Physical Exam  General:  alert, well-developed, and well-nourished.   Lungs:  normal respiratory effort and normal breath sounds.    Heart:  normal rate, regular rhythm, and no gallop.   Abdomen:  soft, non-tender, and normal bowel sounds.   Extremities:  No lower extremity edema  Neurologic:  cranial nerves II-XII intact and gait normal.   Psych:  normally interactive, good eye contact, not anxious appearing, and not depressed appearing.     Impression & Recommendations:  Problem # 1:  NECK PAIN (ICD-723.1) chronic intermittent pain.  refer to PT for eval and tx.  neck pain may be somactic manifestation of dysthymia Orders: Physical Therapy Referral (PT)  Problem # 2:  HIP PAIN, LEFT (ICD-719.45) lateral hip pain better with steroid injection.  deeper pain of unclear etiology.  she does not want MRI as suggested by ortho.  use body pillow at bedtime.  Problem # 3:  IRRITABLE BOWEL SYNDROME (ICD-564.1) she has prodominately bloating and loose stools.   trial of xifaxin two times a day x 7 days.  she has reconsidered EGD and she will contact Dr. Leone Payor.  Problem # 4:  PALPITATIONS (ICD-785.1) Assessment: Unchanged she had cardiac w/u. stress echo normal.  cardionet shows sinus tachycardia and PACs.  she declines b blockers.  I suggest increase in aerobic exercise.  Complete Medication List:  1)  Multivitamins Tabs (Multiple vitamin) .... Once daily 2)  Nuvaring 0.12-0.015 Mg/24hr Ring (Etonogestrel-ethinyl estradiol) .... As directed 3)  Calcium-vitamin D 500-200 Mg-unit Tabs (Calcium carbonate-vitamin d) .... Take 1 tablet by mouth once a day 4)  Nexium 40 Mg Cpdr (Esomeprazole magnesium) .... One by mouth once daily 5)  Hyomax-sr 0.375 Mg Xr12h-tab (Hyoscyamine sulfate) .... One po bid as needed 6)  Amitriptyline Hcl 25 Mg Tabs (Amitriptyline hcl) .... 1/2 to one tab by mouth at bedtime prn 7)  Amoxicillin 875 Mg Tabs (Amoxicillin) .Marland Kitchen.. 1 by mouth tid 8)  Xifaxan 550 Mg Tabs (Rifaximin) .... One by mouth two times a day  Patient Instructions: 1)  Please schedule a follow-up appointment in 6  months. Prescriptions: HYOMAX-SR 0.375 MG XR12H-TAB (HYOSCYAMINE SULFATE) one po bid as needed  #60 x 5   Entered and Authorized by:   D. Thomos Lemons DO   Signed by:   D. Thomos Lemons DO on 11/25/2009   Method used:   Electronically to        [id number] (retail)       [id number]       [place], [place]  [address]       Ph: [phone]       Fax: [phone]   RxID:   [id number] XIFAXAN 550 MG TABS (RIFAXIMIN) one by mouth two times a day  #14 x 0   Entered and Authorized by:   D. Thomos Lemons DO   Signed by:   D. Thomos Lemons DO on 11/25/2009   Method used:   Electronically to        [id number] (retail)       [phone] [id number]       [place], [place]  [address]       Ph: [phone]       Fax: [phone]   RxID:   [id number]

## 2010-12-03 NOTE — Progress Notes (Signed)
  Phone Note Call from Patient   Summary of Call: Called [facility]-Culture was negative called patient back and let her know, she acknowledges understanding. Avel Sensor, CMA

## 2010-12-03 NOTE — Assessment & Plan Note (Signed)
Summary: HEADACHE  X,S 1WK  [place]   Vital Signs:  Patient Profile:   45 Years Old Female Height:     61 inches Weight:      135 pounds BMI:     25.60 O2 treatment:    Room Air Temp:     98.0 degrees F oral Pulse rate:   100 / minute Pulse rhythm:   regular Resp:     18 per minute BP sitting:   144 / 88  (right arm) Cuff size:   regular  Vitals Entered By: Darra Lis RMA (December 21, 2008 3:14 PM)                 PCP:  [name]  Chief Complaint:  headaches.  History of Present Illness: Patient has had a headache since last Friday. She has had some nausea, vomiting and dizziness. The headache is generalized.  No light / sound sensitivity.  She thought it was a sinus headache initially, but the headache has worsened.  She has taken OTC sinus medication and phenergain and diazepam - not together - but over the course of the day today.  She has had some relief with treatments, but she is concerned about a spot on the back of her head that feels numb and the visual changes.  Friday night she had some blurriness and since then its a little blurry in her left eye.  This is a Dr. Artist Pais patient.  Chart is reviewed and a previous appointment from 09/21/08 was also for complaint of headache.  A/P from that visit listed: "Migraine vs tension migraine.   Phenergen 12.5 mg three times a day as needed.   Samples of skelaxin 800 mg by mouth three times a day as needed provided.  Pt advised to avoid wine.  Neuro exam non focal.  Patient advised to call office if symptoms persist or worsen."  Patient returned on 09/24/08 with complaint of dizzy/nausea.  She stated the headache had resolved.  A/P from that visit listed: "I suspect pt has labryinthitis assoc with URI / possible serous otitis media.   Ceftin 250 mg two times a day x 7 days.   Prednisone taper.  Diazepam for dizziness.  Patient advised to call office if symptoms persist or worsen."  Patient states that this headache is  different - more severe and is accompanied by dizziness and nausea - whereas before those were separate events.  The visual changes are concerning.  She denies any slurred speech, facial droop, weakness in extermity or other associated neurologic changes.  She does not feel the headache is sinus related - she denies any nasal congestion.  She does not skip meals, she drinks 60oz water / day and has no more than 1 serving of caffeine / day.       Updated Prior Medication List: NEXIUM 40 MG  CPDR (ESOMEPRAZOLE MAGNESIUM) once daily MULTIVITAMINS   TABS (MULTIPLE VITAMIN) once daily NUVARING 0.12-0.015 MG/24HR  RING (ETONOGESTREL-ETHINYL ESTRADIOL) as directed HYOSCYAMINE SULFATE 0.125 MG TABS (HYOSCYAMINE SULFATE) one by mouth q4 hrs as needed. PROMETHAZINE HCL 12.5 MG TABS (PROMETHAZINE HCL) one by mouth three times a day prn DIAZEPAM 2 MG TABS (DIAZEPAM) 1/2 to one tab by mouth bid IBUPROFEN 800 MG TABS (IBUPROFEN) 1 tab by mouth three times daily  Current Allergies (reviewed today): ! SULFA  Past Medical History:    GERD    Irritable Bowel Syndrome     Tiny hypodensity left hepatic lobe -  CT of abd and Pelvis 07/26/2008       Small 8mm periumblical hernia      Headache  Past Surgical History:    Reviewed history from 09/21/2008 and no changes required:       C-section -2002       DeQuervain Surgery 2000       Review of Systems       see HPI   Physical Exam  General:     alert, well-developed, and well-nourished.   Head:     normocephalic and atraumatic, no abnormality appreciated with palpation or visually Eyes:     No corneal or conjunctival inflammation noted. EOMI. Perrla. Funduscopic exam benign, without hemorrhages, exudates or papilledema. Vision grossly normal. Ears:     External ear exam shows no significant lesions or deformities.  Otoscopic examination reveals clear canals, tympanic membranes are intact bilaterally without bulging, retraction, inflammation or  discharge. Hearing is grossly normal bilaterally. Nose:     External nasal examination shows no deformity or inflammation. Nasal mucosa are pink and moist without lesions or exudates. Mouth:     Oral mucosa and oropharynx without lesions or exudates.  Neck:     supple and full ROM.   Lungs:     Normal respiratory effort, chest expands symmetrically. Lungs are clear to auscultation, no crackles or wheezes. Heart:     Normal rate and regular rhythm. S1 and S2 normal without gallop, murmur, click, rub or other extra sounds. Abdomen:     Bowel sounds positive,abdomen soft and non-tender without masses, organomegaly or hernias noted. Msk:     No deformity noted of thoracic or lumbar spine.   Extremities:     No clubbing, cyanosis, edema, or deformity noted with normal full range of motion of all joints.   Neurologic:     Cranial nerves II-XII intact. Station and gait are normal. Rhomberg negative. Sensory, motor and coordinative functions intact. Skin:     Intact without suspicious lesions or rashes Psych:     Oriented X3, memory intact for recent and remote, normally interactive, good eye contact, not anxious appearing, not depressed appearing, and not agitated.      Impression & Recommendations:  Problem # 1:  HEADACHE (ICD-784.0) Because of the visual changes, my preference is to have the patient evaluated in the ER - however, the patient refuses - she does not want to see the ER doctor.  Also, recommended that she see optho.  At this point, patient will allow labs and CT scan of head.  She has a benign neuro exam.  will get labs - stat, CT of head and call patient with results. Reviewed potential complications of undiagnosed, serious etiology for the headaches and delay in treament by not going through the ER.  Patient understands and accepts the risk of morbidity / mortality.  Reviewed "red flag" symptoms that would require immediate medical attention. Patient to call if symptoms  increase / change / persist or any concerns.   Will call patient with results today and make a plan after that point.    Her updated medication list for this problem includes:    Ibuprofen 800 Mg Tabs (Ibuprofen) .Marland Kitchen... 1 tab by mouth three times daily  Orders: Radiology Referral (Radiology) TLB-BMP (Basic Metabolic Panel-BMET) (80048-METABOL) TLB-CBC Platelet - w/Differential (85025-CBCD) TLB-TSH (Thyroid Stimulating Hormone) (84443-TSH)   Problem # 2:  DIZZINESS (ICD-780.4)  Her updated medication list for this problem includes:    Promethazine Hcl 12.5 Mg Tabs (Promethazine  hcl) ..... One by mouth three times a day prn  Orders: Radiology Referral (Radiology) TLB-BMP (Basic Metabolic Panel-BMET) (80048-METABOL) TLB-CBC Platelet - w/Differential (85025-CBCD) TLB-TSH (Thyroid Stimulating Hormone) (84443-TSH)   Problem # 3:  UNSPECIFIED SUBJECTIVE VISUAL DISTURBANCE (ICD-368.10) see discussion above  Problem # 4:  ELEVATED BP READING WITHOUT DX HYPERTENSION (ICD-796.2) no history of HTN - patient with headache - follow up next week wtih PCP  Complete Medication List: 1)  Nexium 40 Mg Cpdr (Esomeprazole magnesium) .... Once daily 2)  Multivitamins Tabs (Multiple vitamin) .... Once daily 3)  Nuvaring 0.12-0.015 Mg/24hr Ring (Etonogestrel-ethinyl estradiol) .... As directed 4)  Hyoscyamine Sulfate 0.125 Mg Tabs (Hyoscyamine sulfate) .... One by mouth q4 hrs as needed. 5)  Promethazine Hcl 12.5 Mg Tabs (Promethazine hcl) .... One by mouth three times a day prn 6)  Diazepam 2 Mg Tabs (Diazepam) .... 1/2 to one tab by mouth bid 7)  Ibuprofen 800 Mg Tabs (Ibuprofen) .Marland Kitchen.. 1 tab by mouth three times daily   Patient Instructions: 1)  Please schedule a follow-up next week  with Dr. Artist Pais

## 2010-12-03 NOTE — Progress Notes (Signed)
Summary: appt with Dr Jens Som in [facility] 11-17   Phone Note Call from Patient   Caller: Patient Reason for Call: Referral Details for Reason: Referral Summary of Call: Patient called and stated that Dr.Yoo wants her to go to a caridiologist, so she tried to get in with Dr. Riley Kill since she had seen him before but they had nothing to fit her work schedule and could not get her in until January. Patient is requesting a referral to Dr. 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  Initial call taken by: Michaelle Copas,  September 12, 2009 9:22 AM  Follow-up for Phone Call        marj,  Please try to accomodate pt.  see when Dr. Jens Som can see her Follow-up by: D. Thomos Lemons DO,  September 12, 2009 10:29 AM  Additional Follow-up for Phone Call Additional follow up Details #1::        got appt with [facility] 11-17 @ 1200 patient is aware Roselle Locus  September 12, 2009 12:07 PM

## 2010-12-03 NOTE — Letter (Signed)
Summary: Out of Work  Adult nurse at [employer]. [address]   [place], [place] [address]   Phone: [phone]  Fax: [phone]       September 21, 2008     Employee:  Nicole Crosby     To Whom It May Concern:   For Medical reasons, please excuse the above named employee from work for the following dates:  Start:   09/21/08  End:   09/21/08  If you need additional information, please feel free to contact our office.          Sincerely,    Glendell Docker CMA Dr. Thomos Lemons

## 2010-12-03 NOTE — Progress Notes (Signed)
Summary: Ultrasound results  Phone Note Outgoing Call   Summary of Call: call pt - abd u/s normal Initial call taken by: D. Thomos Lemons DO,  May 27, 2010 1:28 PM  Follow-up for Phone Call        patient has been advised per Dr Artist Pais instructions Follow-up by: Glendell Docker CMA,  May 27, 2010 1:52 PM

## 2010-12-03 NOTE — Procedures (Signed)
Summary: Holter/Event Monitor Order Form - Patient Refused   Holter/Event Monitor Order Form - Patient Refused   Imported By: Marylou Mccoy 08/07/2009 09:46:21  _____________________________________________________________________  External Attachment:    Type:   Image     Comment:   External Document

## 2010-12-03 NOTE — Assessment & Plan Note (Signed)
Summary: dizzy & nausea   Vital Signs:  Patient Profile:   45 Years Old Female Height:     61 inches Temp:     98.7 degrees F oral Pulse rate:   98 / minute Pulse rhythm:   regular Resp:     18 per minute BP sitting:   130 / 70  (right arm) Cuff size:   regular  Vitals Entered By: Glendell Docker CMA (September 24, 2008 3:53 PM)                 PCP:  [name]  Chief Complaint:  c/o dizziness and off balance.  History of Present Illness: 45 y/o recently seen for headaches.   Pt reports headache completely resolved.   She reports mild UR congestion.   She is experiencing intermittent dizziness.   She reports tingling left side of her head.  She describes mild vertigo.    Current Allergies (reviewed today): ! 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     Alcohol Use - yes-wine       Physical Exam  General:     alert, well-developed, and well-nourished.   Head:     normocephalic and atraumatic.   Eyes:     vision grossly intact, pupils equal, pupils round, and pupils reactive to light.  no nystagmus Neck:     supple and no masses.   Lungs:     normal respiratory effort and normal breath sounds.   Heart:     normal rate, regular rhythm, and no gallop.   Neurologic:     cranial nerves II-XII intact, gait normal, finger-to-nose normal, and Romberg negative.      Impression & Recommendations:  Problem # 1:  DIZZINESS (ICD-780.4) I suspect pt has labryinthitis assoc with URI / possible serous otitis media.   Ceftin 250 mg two times a day x 7 days.   Prednisone taper.  Diazepam for dizziness.  Patient advised to call office if symptoms persist or worsen.   Her updated medication list for this problem includes:    Promethazine  Hcl 12.5 Mg Tabs (Promethazine hcl) ..... One by mouth three times a day prn   Complete Medication List: 1)  Nexium 40 Mg Cpdr (Esomeprazole magnesium) .... Once daily 2)  Multivitamins Tabs (Multiple vitamin) .... Once daily 3)  Nuvaring 0.12-0.015 Mg/24hr Ring (Etonogestrel-ethinyl estradiol) .... As directed 4)  Hyoscyamine Sulfate 0.125 Mg Tabs (Hyoscyamine sulfate) .... One by mouth q4 hrs as needed. 5)  Promethazine Hcl 12.5 Mg Tabs (Promethazine hcl) .... One by mouth three times a day prn 6)  Skelaxin 800 Mg Tabs (Metaxalone) .... One by mouth three times a day prn 7)  Melatonin 300 Mcg Tabs (Melatonin) .... Take 1 tab by mouth at bedtime 8)  Prednisone 10 Mg Tabs (Prednisone) .... 3 tabs by mouth once daily x 3 days, 2 tabs by mouth once daily x 3 days, 1 tab by mouth once daily x 3 days 9)  Cefuroxime Axetil 250 Mg Tabs (Cefuroxime axetil) .... One by mouth bid 10)  Diazepam 2 Mg Tabs (Diazepam) .... 1/2 to one tab by mouth  bid   Complete Medication List: 1)  Nexium 40 Mg Cpdr (Esomeprazole magnesium) .... Once daily 2)  Multivitamins Tabs (Multiple vitamin) .... Once daily 3)  Nuvaring 0.12-0.015 Mg/24hr Ring (Etonogestrel-ethinyl estradiol) .... As directed 4)  Hyoscyamine Sulfate 0.125 Mg Tabs (Hyoscyamine sulfate) .... One by mouth q4 hrs as needed. 5)  Promethazine Hcl 12.5 Mg Tabs (Promethazine hcl) .... One by mouth three times a day prn 6)  Skelaxin 800 Mg Tabs (Metaxalone) .... One by mouth three times a day prn 7)  Melatonin 300 Mcg Tabs (Melatonin) .... Take 1 tab by mouth at bedtime 8)  Prednisone 10 Mg Tabs (Prednisone) .... 3 tabs by mouth once daily x 3 days, 2 tabs by mouth once daily x 3 days, 1 tab by mouth once daily x 3 days 9)  Cefuroxime Axetil 250 Mg Tabs (Cefuroxime axetil) .... One by mouth bid 10)  Diazepam 2 Mg Tabs (Diazepam) .... 1/2 to one tab by mouth bid   Patient Instructions: 1)  Call our office if your symptoms do not  improve or gets  worse.   Prescriptions: DIAZEPAM 2 MG TABS (DIAZEPAM) 1/2 to one tab by mouth bid  #15 x 0   Entered and Authorized by:   D. Thomos Lemons DO   Signed by:   D. Thomos Lemons DO on 09/24/2008   Method used:   Print then Give to Patient   RxID:   [id number] CEFUROXIME AXETIL 250 MG TABS (CEFUROXIME AXETIL) one by mouth bid  #14 x 0   Entered and Authorized by:   D. Thomos Lemons DO   Signed by:   D. Thomos Lemons DO on 09/24/2008   Method used:   Print then Give to Patient   RxID:   [id number] PREDNISONE 10 MG TABS (PREDNISONE) 3 tabs by mouth once daily x 3 days, 2 tabs by mouth once daily x 3 days, 1 tab by mouth once daily x 3 days  #18 x 0   Entered and Authorized by:   D. Thomos Lemons DO   Signed by:   D. Thomos Lemons DO on 09/24/2008   Method used:   Print then Give to Patient   RxID:   [id number]  ] Current Allergies (reviewed today): ! SULFA

## 2010-12-03 NOTE — Progress Notes (Signed)
Summary: returning call  Phone Note Call from Patient Call back at Work Phone [phone]   Caller: Patient Reason for Call: Talk to Nurse Summary of Call: returning call Initial call taken by: Migdalia Dk,  October 14, 2009 8:07 AM  Follow-up for Phone Call        10/14/09--9AM--PT GIVEN RESULTS OF STRESS ECHO,STRESS TEST--NT Follow-up by: Ledon Snare, RN,  October 14, 2009 8:38 AM

## 2010-12-03 NOTE — Progress Notes (Signed)
Summary: hida scan result  Phone Note Outgoing Call   Summary of Call: call pt - HIDA scan was normal Initial call taken by: D. Thomos Lemons DO,  June 09, 2010 1:36 PM  Follow-up for Phone Call        Left message at work number for pt to return my call.  Nicki Guadalajara Fergerson CMA Duncan Dull)  June 09, 2010 2:30 PM   Additional Follow-up for Phone Call Additional follow up Details #1::        Pt notified. Nicki Guadalajara Fergerson CMA Duncan Dull)  June 09, 2010 3:08 PM

## 2010-12-03 NOTE — Miscellaneous (Signed)
Summary: Lab Orders  Clinical Lists Changes  Orders: Added new Test order of T-Basic Metabolic Panel [phone]) - Signed Added new Test order of T-Hepatic Function [phone]) - Signed Added new Test order of T-Lipid Profile [phone]) - Signed Added new Test order of T-CBC w/Diff [id number]) - Signed Added new Test order of T-TSH ([id number]) - Signed

## 2010-12-03 NOTE — Letter (Signed)
Summary: Out of Work  [facility]  [address]   [place], [place] [address]   Phone: [phone]  Fax: [phone]    November 21, 2009   Employee:  LINDAMARIE MACLACHLAN    To Whom It May Concern:   For Medical reasons, please excuse the above named employee from work for the following dates:  Start:   21 Nov 2009  End:   23 JAM 2011  If you need additional information, please feel free to contact our office.         Sincerely,    Marvis Moeller DO

## 2010-12-03 NOTE — Miscellaneous (Signed)
Summary: Discharge/[facility]  Discharge/[facility]   Imported By: Lanelle Bal 01/31/2009 13:49:24  _____________________________________________________________________  External Attachment:    Type:   Image     Comment:   External Document

## 2010-12-03 NOTE — Assessment & Plan Note (Signed)
Summary: NP,BURSITIS LEFT HIP,MC   Vital Signs:  Patient profile:   45 year old female Height:      61 inches Weight:      136 pounds  Primary Care Provider:  [name]   History of Present Illness: 45 yo F here for left hip pain.  Patient reports for the past 12 years since having her son she has had off and on anterior/lateral hip pain Saw Dr. Priscille Kluver for this issue about 9 years ago - more anterior pain at that time and had an intraarticular hip injection. Had good relief from this up until November when dx with trochanteric bursitis on left side. Injection provided good relief for this issue until past few months. States has done physical therapy in past. X-ray and MRI remotely of hip were negative (no arthrogram in past). Pain currently is lateral with radiation distal.  Sometimes gets numbness and tingling Better with activity and worse when sitting. Not doing exercise on uneven surfaces. Occasional catching but only when going from sitting to standing and this is in groin.  No locking. Tried ibuprofen with some relief.  Also discussed having been diagnosed with golfers elbow and today was provided with handout about this and basic rehab protocol.  Problems Prior to Update: 1)  Bursitis, Left Hip  (ICD-726.5) 2)  Sciatica, Left  (ICD-724.3) 3)  Dyspnea  (ICD-786.05) 4)  Dysthymia  (ICD-300.4) 5)  Hip Pain, Left  (ICD-719.45) 6)  Hyperlipidemia  (ICD-272.4) 7)  Chest Pain, Atypical  (ICD-786.59) 8)  Palpitations  (ICD-785.1) 9)  Neck Pain  (ICD-723.1) 10)  Elevated Bp Reading Without Dx Hypertension  (ICD-796.2) 11)  Unspecified Subjective Visual Disturbance  (ICD-368.10) 12)  Encounter For Long-term Use of Other Medications  (ICD-V58.69) 13)  Dizziness  (ICD-780.4) 14)  Headache  (ICD-784.0) 15)  Insomnia  (ICD-780.52) 16)  Hepatic Cyst  (ICD-573.8) 17)  Other Abnormal Glucose  (ICD-790.29) 18)  Gerd  (ICD-530.81) 19)  Health Maintenance Exam   (ICD-V70.0) 20)  Abdominal Pain, Unspecified Site  (ICD-789.00) 21)  Irritable Bowel Syndrome  (ICD-564.1) 22)  Abdominal Bloating  (ICD-787.3) 23)  Abdominal Pain, Epigastric  (ICD-789.06) 24)  Hiatal Hernia  (ICD-553.3)  Medications Prior to Update: 1)  Multivitamins   Tabs (Multiple Vitamin) .... Once Daily 2)  Nuvaring 0.12-0.015 Mg/24hr  Ring (Etonogestrel-Ethinyl Estradiol) .... As Directed 3)  Nexium 40 Mg Cpdr (Esomeprazole Magnesium) .... One By Mouth By Mouth Once Daily 4)  Amitriptyline Hcl 25 Mg Tabs (Amitriptyline Hcl) .... 1/2 To One Tab By Mouth At Bedtime Prn 5)  Advil 200 Mg Tabs (Ibuprofen) .... As Needed 6)  Tylenol 325 Mg Tabs (Acetaminophen) .... As Needed 7)  Clidinium-Chlordiazepoxide 2.5-5 Mg Caps (Clidinium-Chlordiazepoxide) .... One By Mouth Two Times A Day As Needed For Abdominal Pain 8)  Triamcinolone Acetonide 0.1 % Crea (Triamcinolone Acetonide) .... Apply Two Times A Day  Allergies (verified): 1)  ! 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  Alcohol Use - yes-wine       Full Time  Married       Physical Exam  General:  alert, well-developed, and well-nourished.   Msk:  Back: No gross deformity. No midline or paraspinal TTP.  No TTP SI joint. FROM without reproduction of pain. Strength 5/5 BLEs. Mild pain with standing external hip rotation but no catching reproduced. Negative Fabers, negative piriformis testing Strength 5/5 BLEs except left hip  abduction 4/5 strength. Sensation intact to light touch. SLR with pain on left lateral side but not past knee. No leg length inequality.  L hip: No gross deformity. Mod TTP left greater trochanter and over iliopsoas tendon as crosses hip joint. FROM without catching reproduced.  Negative log roll.  R elbow: TTP medial epicondyle. FROM.   Impression & Recommendations:  Problem # 1:  BURSITIS, LEFT HIP  (ICD-726.5) Assessment Deteriorated  Agree with diagnosis of trochanteric bursitis.  Injection helped previously so repeated today.  For long term relief stressed need for strengthening hip abductors and external rotators - handout provided and exercises demonstrated.  No leg length inequality noted on exam that needs to be corrected.   After informed written consent patient was lying on right side on the exam table and area over left greater trochanter was prepped with iodine and alcohol swab.  Area of greater trochanteric bursa was then injected with 2:6 depomedrol:marcaine after use of cold spray.  Patient tolerated the procedure well without any immediate complications.   Orders: Joint Aspirate / Injection, Large (20610)  Problem # 2:  HIP PAIN, LEFT (ICD-719.45) Assessment: Deteriorated Believe hip catching represents more external impingement of iliopsoas tendon than true labral pathology.  If rehab does not help with this (hip flexion/rotation exercises) and catching becomes more prominent, would consider MR arthrogram.  Otherwise continue to treat conservatively.  Her updated medication list for this problem includes:    Advil 200 Mg Tabs (Ibuprofen) .Marland Kitchen... As needed    Tylenol 325 Mg Tabs (Acetaminophen) .Marland Kitchen... As needed  Problem # 3:  MEDIAL EPICONDYLITIS, RIGHT (ICD-726.31) Assessment: New Handout provided on basic rehab - already using topical voltaren.  If continues to be an issue, advised to make an appointment to discuss further treatment, consider counterforce brace, physical therapy.  Complete Medication List: 1)  Multivitamins Tabs (Multiple vitamin) .... Once daily 2)  Nuvaring 0.12-0.015 Mg/24hr Ring (Etonogestrel-ethinyl estradiol) .... As directed 3)  Nexium 40 Mg Cpdr (Esomeprazole magnesium) .... One by mouth by mouth once daily 4)  Amitriptyline Hcl 25 Mg Tabs (Amitriptyline hcl) .... 1/2 to one tab by mouth at bedtime prn 5)  Advil 200 Mg Tabs (Ibuprofen) .... As  needed 6)  Tylenol 325 Mg Tabs (Acetaminophen) .... As needed 7)  Clidinium-chlordiazepoxide 2.5-5 Mg Caps (Clidinium-chlordiazepoxide) .... One by mouth two times a day as needed for abdominal pain 8)  Triamcinolone Acetonide 0.1 % Crea (Triamcinolone acetonide) .... Apply two times a day  Patient Instructions: 1)  You were given an injection for your trochanteric bursitis. 2)  Ice over the area 3-4 times a day 15 minutes at a time. 3)  Ok to try voltaren gel to this area also.  Tylenol is ok to take for pain also. 4)  Avoid painful activities when possible. 5)  Avoid lying on affected side. 6)  Do hip abduction exercises 3 x 15 at least once a day, most days of the week.  If too easy, add 2 pound ankle weight. 7)  Standing hip rotation 3 x 15 once a day most days of the week.  If too easy, add 2 pound ankle weight. 8)  Stretches - pick 2, do 3 x 30 seconds at least once a day 9)  Consider physical therapy again if not improving. 10)  If the catching becomes more severe despite hip rotation/flexion exercise or locks your hip, you should get an MR arthrogram and consider surgery for this. 11)  Follow up in 6 weeks  for reevaluation or call us with any concerns.

## 2010-12-03 NOTE — Progress Notes (Signed)
  Phone Note Outgoing Call   Summary of Call: call pt - liver u/s  still shows small liver cysts (stable).  otherwise unremarkable Initial call taken by: D. Thomos Lemons DO,  August 22, 2009 9:35 AM  Follow-up for Phone Call        Pt notified as directed   Follow-up by: Darral Dash,  August 22, 2009 9:42 AM

## 2010-12-03 NOTE — Assessment & Plan Note (Signed)
Summary: HIP PAIN/HEA, rescheduled- jr   Vital Signs:  Patient profile:   45 year old female Weight:      135 pounds BMI:     25.60 O2 Sat:      100 % on Room air Temp:     98.3 degrees F oral Pulse rate:   104 / minute Pulse rhythm:   regular Resp:     16 per minute BP sitting:   128 / 78  (right arm) Cuff size:   regular  Vitals Entered By: Glendell Docker CMA (September 30, 2009 1:17 PM)  O2 Flow:  Room air  Primary Care Provider:  D. Thomos Lemons DO  CC:  Hip & Neck Pain and Lower Extremity Joint pain.  History of Present Illness:  Lower Extremity Joint Pain      This is a 45 year old woman who presents with Lower Extremity Joint pain.  The patient reports locking.  The pain is located in the left hip.  The pain began gradually and with no injury.  The pain is described as intermittent and activity related.  Evaluation to date has included MRI scan.    neck pain - feels pressure like sensation base of spine.  No weakness.  no radicular symptoms.  Mood d/o - seen by GYN.   they discussed depressive symptoms.  tried lexapro but experienced side effects.   Preventive Screening-Counseling & Management  Alcohol-Tobacco     Smoking Status: never  Allergies: 1)  ! Sulfa  Past History:  Past Medical History: Current Problems:  INSOMNIA (ICD-780.52) HEPATIC CYST (ICD-573.8) GERD (ICD-530.81)  IRRITABLE BOWEL SYNDROME (ICD-564.1) HIATAL HERNIA (ICD-553.3) Tiny hypodensity left hepatic lobe - CT of abd and Pelvis 07/26/2008    Small 8mm periumblical hernia        Past Surgical History: C-section -2002 DeQuervain Surgery 2000         Family History: Family History of Prostate Cancer:Father deceased Family History of Diabetes: Father, Both Grandmothers, 2 Sisters Family History of Heart Disease: Father (MI 61), Grandmother GU cancer - no Colon ca - uncle (died late 37's)      Social History: Occupation: Engineer, 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  Alcohol  Use - yes-wine       Full Time  Married  Smoking Status:  never  Physical Exam  General:  alert, well-developed, and well-nourished.   Neck:  full ROM.  slight discomfort with right side bending. Lungs:  normal respiratory effort and normal breath sounds.   Heart:  normal rate, regular rhythm, and no gallop.   Neurologic:  cranial nerves II-XII intact and strength normal in all extremities.   Psych:  normally interactive and tearful.     Impression & Recommendations:  Problem # 1:  INSOMNIA (ICD-780.52) Pt never started started amitriptyline.  reassured pt amitriptyline is not "addictive"  Problem # 2:  NECK PAIN (ICD-723.1)  Pt notes pressure sensation at base of spine.  no sign of radiculopathy.  Use muscle relaxer and ibuprofen as needed.  refer to PT for eval and treatment.  Her updated medication list for this problem includes:    Metaxalone 800 Mg Tabs (Metaxalone) ..... One by mouth at bedtime prn  Orders: Physical Therapy Referral (PT)  Problem # 3:  HIP PAIN, LEFT (ICD-719.45) She reports hx of intermittent left hip pain since her last pregnancy 11 yrs ago.  Last MRI 6 yrs ago reported normal.  she feels a "catch" when she sits for  prolonged period. she has tenderness of left hip bursa.  refer to Dr. Cleophas Dunker for further eval and tx.  use ibuprofen sparingly as needed.  Her updated medication list for this problem includes:    Metaxalone 800 Mg Tabs (Metaxalone) ..... One by mouth at bedtime prn  Orders: Orthopedic Referral (Ortho)  Problem # 4:  DYSTHYMIA (ICD-300.4) Pt notes increased stress at home.  Her husband lost his job last yr.   She is only bread winner.  No financial issues.  Low libido.  Her GYN tried lexapro but it caused nausea, abd pain, and bloating.  We discussed trying effexor after cardiac work up completed.  Pt advised to start amitriptyline for chronic insomnia.  Complete Medication List: 1)  Multivitamins Tabs (Multiple vitamin) .... Once  daily 2)  Nuvaring 0.12-0.015 Mg/24hr Ring (Etonogestrel-ethinyl estradiol) .... As directed 3)  Calcium-vitamin D 500-200 Mg-unit Tabs (Calcium carbonate-vitamin d) .... Take 1 tablet by mouth once a day 4)  Nexium 40 Mg Cpdr (Esomeprazole magnesium) .... One by mouth once daily 5)  Hyomax-sr 0.375 Mg Xr12h-tab (Hyoscyamine sulfate) .... As needed 6)  Metaxalone 800 Mg Tabs (Metaxalone) .... One by mouth at bedtime prn 7)  Amitriptyline Hcl 25 Mg Tabs (Amitriptyline hcl) .... 1/2 to one tab by mouth at bedtime prn  Patient Instructions: 1)  Please schedule a follow-up appointment in 2 months. Prescriptions: AMITRIPTYLINE HCL 25 MG TABS (AMITRIPTYLINE HCL) 1/2 to one tab by mouth at bedtime prn  #30 x 2   Entered and Authorized by:   D. Thomos Lemons DO   Signed by:   D. Thomos Lemons DO on 09/30/2009   Method used:   Electronically to        [facility]* (retail)       [address]       [address] Shipping/mailing       [place], [place]  [address]       Ph: [phone]       Fax: [phone]   RxID:   [id number] METAXALONE 800 MG TABS (METAXALONE) one by mouth at bedtime prn  #30 x 0   Entered and Authorized by:   D. Thomos Lemons DO   Signed by:   D. Thomos Lemons DO on 09/30/2009   Method used:   Electronically to        [facility]* (retail)       [address]       [address] [place] [address]mail[address]       [place], [place]  [address]       Ph: [phone]       Fax: [phone]   RxID:   [id number]    Immunization History:  Influenza Immunization History:    Influenza:  historical (08/20/2009)   Current Allergies (reviewed today): ! SULFA   Preventive Care Screening  Pap Smear:    Date:  09/13/2009    Results:  normal

## 2010-12-03 NOTE — Progress Notes (Signed)
Summary: Nexium side effects?  Phone Note Call from Patient Call back at [phone]   Caller: Patient Call For: D. Thomos Lemons DO Summary of Call: Pt left voice message stating she is afraid to take Nexium two times a day due to new warnings that have come out for risk of possible hip fractures and osteoporosis. Pt states that she has been having hip pain for some time now and does not feel comfortable increasing the dose.  Please advise.  Nicki Guadalajara Fergerson CMA Duncan Dull)  June 03, 2010 1:22 PM   Follow-up for Phone Call        ok to continue once daily Follow-up by: D. Thomos Lemons DO,  June 03, 2010 1:26 PM  Additional Follow-up for Phone Call Additional follow up Details #1::        Pt notified per Dr. Olegario Messier instruction.  Nicki Guadalajara Fergerson CMA Duncan Dull)  June 03, 2010 4:29 PM     New/Updated Medications: NEXIUM 40 MG CPDR (ESOMEPRAZOLE MAGNESIUM) one by mouth by mouth once daily

## 2010-12-03 NOTE — Assessment & Plan Note (Signed)
Summary: 1 MONTH ROV-CH   Vital Signs:  Patient Profile:   45 Years Old Female Height:     61 inches Weight:      134 pounds BMI:     25.41 Temp:     98.8 degrees F oral Pulse rate:   86 / minute Pulse rhythm:   regular Resp:     18 per minute BP sitting:   122 / 80  (left arm) Cuff size:   regular  Vitals Entered By: Glendell Docker CMA (August 14, 2008 4:44 PM)                 PCP:  [name]  Chief Complaint:  Follow up disease management & headache.  History of Present Illness: 45 y/o white female for follow up.   Pt reports keeping food journal.  She has noticed increased abd discomfort with eating protein bar.  She uses hyoscyamine on as needed basis.   She has not started fiber supplement.    We reviewed recent U/S.   She had CT of abd and pelvis.  It was negative for acute process.   It noted tiny hypodensity in left hepatic lobe.  Pt is using OTC "fat burner" supplement.   She has noticed more headaches since starting supplement.    Current Allergies (reviewed today): ! SULFA  Past Medical History:    GERD    Irritable Bowel Syndrome     Tiny hypodensity left hepatic lobe - CT of abd and Pelvis 07/26/2008       Small 8mm periumblical hernia   Social History:    Occupation: Engineer, structural    Patient has never smoked.     Alcohol Use - yes-wine     Review of Systems      See HPI   Physical Exam  General:     alert, well-developed, and well-nourished.   Lungs:     normal respiratory effort and normal breath sounds.   Heart:     normal rate, regular rhythm, and no gallop.   Abdomen:     soft and non-tender.   Extremities:     No lower extremity edema     Impression & Recommendations:  Problem # 1:  IRRITABLE BOWEL SYNDROME (ICD-564.1) I urged pt to start fiber supplement.  Continue hyoscyamine as needed.  CT of abd and pelvis and abd u/s negative except for incidental tiny liver hypodensities.  Problem # 2:  HEPATIC CYST  (ICD-573.8) CT of Abd and Pelvis showed tiny hypodensities in left hepatic lobe.  Arrange one year follow up abd u/s to ensure stability.  Problem # 3:  INSOMNIA (ICD-780.52) Discussed sleep hygiene.   Trial of low dose amitriptyline.  Complete Medication List: 1)  Nexium 40 Mg Cpdr (Esomeprazole magnesium) .... Once daily 2)  Multivitamins Tabs (Multiple vitamin) .... Once daily 3)  Nuvaring 0.12-0.015 Mg/24hr Ring (Etonogestrel-ethinyl estradiol) .... As directed 4)  Hyoscyamine Sulfate 0.125 Mg Tabs (Hyoscyamine sulfate) .... One by mouth q4 hrs as needed. 5)  Amitriptyline Hcl 10 Mg Tabs (Amitriptyline hcl) .... One by mouth at bedtime prn   Patient Instructions: 1)  Please schedule a follow-up appointment as needed.   Prescriptions: AMITRIPTYLINE HCL 10 MG TABS (AMITRIPTYLINE HCL) one by mouth at bedtime prn  #30 x 2   Entered and Authorized by:   D. Thomos Lemons DO   Signed by:   D. Thomos Lemons DO on 08/14/2008   Method used:   Electronically to  [facility]* (retail)       [address]       [address] Shipping/mailing       [place], [place]  [address]       Ph: [phone]       Fax: [phone]   RxID:   [id number]  ] Current Allergies (reviewed today): ! SULFA

## 2010-12-03 NOTE — Assessment & Plan Note (Signed)
Summary: hip and elbow pain/mhf   Vital Signs:  Patient profile:   45 year old female Height:      61 inches Weight:      136.75 pounds BMI:     25.93 O2 Sat:      99 % on Room air Temp:     98.2 degrees F oral Pulse rate:   95 / minute Pulse rhythm:   regular Resp:     16 per minute BP sitting:   122 / 72  (left arm) Cuff size:   regular  Vitals Entered By: Glendell Docker CMA (July 15, 2010 4:25 PM)  O2 Flow:  Room air CC: Hip pain Is Patient Diabetic? No Pain Assessment Patient in pain? yes     Location: hip Intensity: 4 Type: throbbing Onset of pain  Intermittent   Primary Care Provider:  [name]  CC:  Hip pain.  History of Present Illness: 45 y/o white female c/o chronic intermittent left hip pain no injury or trauma pain with laying on left side no pain with wt bearing   Preventive Screening-Counseling & Management  Alcohol-Tobacco     Smoking Status: never  Allergies: 1)  ! 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  Alcohol Use - yes-wine       Full Time  Married        Physical Exam  General:  alert, well-developed, and well-nourished.   Lungs:  normal respiratory effort and normal breath sounds.   Heart:  normal rate, regular rhythm, and no gallop.   Msk:  no pain in left hip with int or ext rotation.   left trochanteric bursa  tender   Impression & Recommendations:  Problem # 1:  BURSITIS, LEFT HIP (ICD-726.5) refer to ortho for injection  Orders: Orthopedic Referral (Ortho)  Problem # 2:  ABDOMINAL PAIN, UNSPECIFIED SITE (ICD-789.00) Assessment: Deteriorated  Orders: Ultrasound (Ultrasound)  Complete Medication List: 1)  Multivitamins Tabs (Multiple vitamin) .... Once daily 2)  Nuvaring 0.12-0.015 Mg/24hr Ring (Etonogestrel-ethinyl estradiol) .... As directed 3)  Nexium 40 Mg Cpdr (Esomeprazole magnesium) .... One by mouth by mouth once daily 4)  Amitriptyline Hcl 25 Mg Tabs (Amitriptyline hcl) .... 1/2 to one tab by mouth at bedtime prn 5)  Advil 200 Mg Tabs (Ibuprofen) .... As needed 6)  Tylenol 325 Mg Tabs (Acetaminophen) .... As needed 7)  Clidinium-chlordiazepoxide 2.5-5 Mg Caps (Clidinium-chlordiazepoxide) .... One by mouth two times a day as needed for abdominal pain 8)  Triamcinolone Acetonide 0.1 % Crea (Triamcinolone acetonide) .... Apply two times a day  Patient Instructions: 1)  Please schedule a follow-up appointment as needed. Prescriptions: AMITRIPTYLINE HCL 25 MG TABS (AMITRIPTYLINE HCL) 1/2 to one tab by mouth at bedtime prn  #30 x 5   Entered and Authorized by:   D. Thomos Lemons DO   Signed by:  Nicole Spry DO on 07/15/2010   Method used:   Electronically to        [facility]* (retail)       [address]       [address] [place] [address]mailing       [place], [place]  [address]       Ph: [phone]       Fax: [phone]   RxID:   [id number] TRIAMCINOLONE ACETONIDE 0.1 % CREA (TRIAMCINOLONE ACETONIDE) apply two times a day  #15 grams x 1   Entered and Authorized by:   D. Thomos Lemons DO   Signed by:   D. Thomos Lemons DO on 07/15/2010   Method used:   Electronically to        [facility]* (retail)       [address]       [address] Shipping/mailing       [place], [place]  [address]       Ph: [phone]       Fax: [phone]    RxID:   [id number]   Current Allergies (reviewed today): ! SULFA

## 2010-12-03 NOTE — Progress Notes (Signed)
Summary: Prior Auth--Omeprazole-Sodium bicarbonate  Phone Note From Pharmacy   Caller: [facility]* Call For: Dr Artist Pais  Summary of Call: Received fax from pharmacy stating that Omeprazole-sodium Bicarbonat 40-1100mg  1 two times a day will require prior authorization.  Spoke to Micco at [facility] [phone] and initiated PA process. She will fax form. Case ID [id number].  Pt notified of process.  States that she has been taking Nexium but Dr. Artist Pais wanted to swich med for better symptom control; has not tried any other PPIs. Office currently out of samples but pt states she still has some Nexium that she is taking. Nicki Guadalajara Fergerson CMA Duncan Dull)  May 21, 2010 1:18 PM   Follow-up for Phone Call        Form received, completed and sent to Dr. for signature.  Nicki Guadalajara Fergerson CMA Duncan Dull)  May 22, 2010 3:58 PM   Additional Follow-up for Phone Call Additional follow up Details #1::        Prior authorization faxed to [employer], awaiting approval status Additional Follow-up by: Glendell Docker CMA,  May 26, 2010 4:47 PM    Additional Follow-up for Phone Call Additional follow up Details #2::    Received denial from [employer] stating that pt's use of this med at a greater quantity does not meet the requirements for additional coverage.  Please advise.  Nicki Guadalajara Fergerson CMA Duncan Dull)  May 27, 2010 9:44 AM   Additional Follow-up for Phone Call Additional follow up Details #3:: Details for Additional Follow-up Action Taken: please advise pt to continue nexium for now.  see rx Additional Follow-up by: D. Thomos Lemons DO,  May 27, 2010 1:27 PM  New/Updated Medications: NEXIUM 40 MG CPDR (ESOMEPRAZOLE MAGNESIUM) one by mouth two times a day Prescriptions: NEXIUM 40 MG CPDR (ESOMEPRAZOLE MAGNESIUM) one by mouth two times a day  #60 x 2   Entered and Authorized by:   D. Thomos Lemons DO   Signed by:   D. Thomos Lemons DO on 05/27/2010   Method used:   Electronically to        [facility]* (retail)       [address]       [address] Shipping/mailing       [place], [place]  [address]       Ph: [phone]       Fax: [phone]   RxID:   [id number]   Appended Document: Prior Auth--Omeprazole-Sodium bicarbonate Left detailed message on pt's cell voice mail that Ins. will not authorize Omeprazole-sodium Bicarbonate 40-1100mg .  Per Dr. Artist Pais, pt needs to start Nexium 40mg  1 two times a day. Rx. has been sent to outpt pharmacy.  Advised pt. to call if she has any questions.

## 2010-12-03 NOTE — Assessment & Plan Note (Signed)
Summary: Sorethroat, fever, aches x 1 dy rm 2   Vital Signs:  Patient Profile:   45 Years Old Female CC:      Cough Height:     61 inches Weight:      136 pounds O2 Sat:      100 % O2 treatment:    Room Air Temp:     101.0 degrees F oral Pulse rate:   125 / minute Pulse rhythm:   regular Resp:     16 per minute BP sitting:   131 / 87  (right arm) Cuff size:   regular  Vitals Entered By: Areta Haber CMA (November 21, 2009 6:39 PM)                  Current Allergies: ! SULFA    History of Present Illness Chief Complaint: sore throat History of Present Illness: ONSET YESTERDAY WITH SORE THROAT THAT IS GETTING WORSE. FEVER TO 102. NO RUNNY NOSE OR COUGH. HAS BODYACHES. HAS TAKEN MOTRIN AND USING THROAT SPRAY.   Current Meds MULTIVITAMINS   TABS (MULTIPLE VITAMIN) once daily NUVARING 0.12-0.015 MG/24HR  RING (ETONOGESTREL-ETHINYL ESTRADIOL) as directed CALCIUM-VITAMIN D 500-200 MG-UNIT TABS (CALCIUM CARBONATE-VITAMIN D) Take 1 tablet by mouth once a day NEXIUM 40 MG CPDR (ESOMEPRAZOLE MAGNESIUM) one by mouth once daily HYOMAX-SR 0.375 MG XR12H-TAB (HYOSCYAMINE SULFATE) as needed AMITRIPTYLINE HCL 25 MG TABS (AMITRIPTYLINE HCL) 1/2 to one tab by mouth at bedtime prn AMOXICILLIN 875 MG TABS (AMOXICILLIN) 1 by mouth TID  REVIEW OF SYSTEMS Constitutional Symptoms       Complains of fever and fatigue.     Denies chills, night sweats, weight loss, and weight gain.  Eyes       Denies change in vision, eye pain, eye discharge, glasses, contact lenses, and eye surgery. Ear/Nose/Throat/Mouth       Complains of sore throat.      Denies hearing loss/aids, change in hearing, ear pain, ear discharge, dizziness, frequent runny nose, frequent nose bleeds, sinus problems, hoarseness, and tooth pain or bleeding.      Comments: x 1 dy Respiratory       Denies dry cough, productive cough, wheezing, shortness of breath, asthma, bronchitis, and emphysema/COPD.  Cardiovascular  Denies murmurs, chest pain, and tires easily with exhertion.    Gastrointestinal       Denies stomach pain, nausea/vomiting, diarrhea, constipation, blood in bowel movements, and indigestion. Genitourniary       Denies painful urination, kidney stones, and loss of urinary control. Neurological       Denies paralysis, seizures, and fainting/blackouts. Musculoskeletal       Denies muscle pain, joint pain, joint stiffness, decreased range of motion, redness, swelling, muscle weakness, and gout.  Skin       Denies bruising, unusual mles/lumps or sores, and hair/skin or nail changes.  Psych       Denies mood changes, temper/anger issues, anxiety/stress, speech problems, depression, and sleep problems.  Past History:  Past Medical History: Last updated: 09/30/2009 Current Problems:  INSOMNIA (ICD-780.52) HEPATIC CYST (ICD-573.8) GERD (ICD-530.81)  IRRITABLE BOWEL SYNDROME (ICD-564.1) HIATAL HERNIA (ICD-553.3) Tiny hypodensity left hepatic lobe - CT of abd and Pelvis 07/26/2008    Small 8mm periumblical hernia        Past Surgical History: Last updated: 09/30/2009 C-section -2002 DeQuervain Surgery 2000         Family History: Last updated: 09/30/2009 Family History of Prostate Cancer:Father deceased Family History of Diabetes: Father, Both Grandmothers, 2 Sisters  Family History of Heart Disease: Father (MI 29), Grandmother GU cancer - no Colon ca - uncle (died late 45's)      Social History: Last updated: 09/30/2009 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  Alcohol Use - yes-wine       Full Time  Married   Risk Factors: Alcohol Use: 0 (06/18/2009) Caffeine Use: 1 beverage daily (06/18/2009) Exercise: yes (06/18/2009)  Risk Factors: Smoking Status: never (09/30/2009) Physical Exam General appearance: well developed, well nourished, no acute distress Ears: normal, no lesions or deformities Nasal: mucosa pink, nonedematous, no septal deviation,  turbinates normal Oral/Pharynx: RED AND SWOLLEN. NO EXUDATES. UVULA MIDLINE Neck: neck supple,  trachea midline, no masses Chest/Lungs: no rales, wheezes, or rhonchi bilateral, breath sounds equal without effort Heart: regular rate and  rhythm, no murmur Skin: no obvious rashes or lesions Assessment New Problems: PHARYNGITIS, ACUTE (ICD-462.0)   Plan New Medications/Changes: AMOXICILLIN 875 MG TABS (AMOXICILLIN) 1 by mouth TID  #30 x 0, 11/21/2009, Marvis Moeller DO  New Orders: New Patient Level III [99203]   Prescriptions: AMOXICILLIN 875 MG TABS (AMOXICILLIN) 1 by mouth TID  #30 x 0   Entered and Authorized by:   Marvis Moeller DO   Signed by:   Marvis Moeller DO on 11/21/2009   Method used:   Print then Give to Patient   RxID:   [id number]   Patient Instructions: 1)  TYLENOL AT 12 AND 6, MOTRIN AT 3 AND 9 FOR FEVER AND DISCOMFORT. THROAT SPRAY OF CHOICE. AVOID CAFFEINE AND MILK PRODUCTS. FOLLOW UP WITH YOUR PCP IF SYMPTOMS PERSIST.

## 2010-12-03 NOTE — Progress Notes (Signed)
Summary: Lab Results  Phone Note Outgoing Call   Summary of Call: call pt - CA125 is normal Initial call taken by: D. Thomos Lemons DO,  August 07, 2008 4:02 PM  Follow-up for Phone Call        patient informed per Dr Artist Pais instructions Follow-up by: Glendell Docker CMA,  August 08, 2008 10:23 AM

## 2010-12-03 NOTE — Assessment & Plan Note (Signed)
Summary: ABD PAIN/CHEST TIGHTNESS/HEA   Vital Signs:  Patient profile:   45 year old female Height:      61 inches Weight:      136.75 pounds BMI:     25.93 Temp:     98.4 degrees F oral Pulse rate:   80 / minute Pulse rhythm:   regular Resp:     16 per minute BP sitting:   100 / 80  (left arm) Cuff size:   regular  Vitals Entered By: Glendell Docker CMA (June 18, 2009 4:01 PM)  Primary Care Provider:  [name]  CC:  chest tightness.  History of Present Illness: c/o chest tightness and abdominal pain for the past 2 months states that her hearts beat fast and her breath taken away and then she coughs, happened 4 times in the past week.,  c/o  light headed when this occurs.    Drinks caffeine but not execessive.  She exercises on a regular basis w/o chest pain or dyspnea.  She denies recent life stresors.  Preventive Screening-Counseling & Management  Alcohol-Tobacco     Alcohol drinks/day: 0     Alcohol Counseling: not indicated; patient does not drink     Smoking Status: quit     Year Started: 1983     Tobacco Counseling: not to resume use of tobacco products  Caffeine-Diet-Exercise     Caffeine use/day: 1 beverage daily     Caffeine Counseling: not indicated; caffeine use is not excessive or problematic     Does Patient Exercise: yes     Times/week: 3  Allergies: 1)  ! 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    Alcohol Use - yes-wine      Smoking Status:  quit Caffeine use/day:  1 beverage daily Does Patient Exercise:  yes  Physical Exam  General:  alert, well-developed, and well-nourished.   Neck:  supple and full ROM.   Lungs:  normal respiratory effort and normal breath sounds.   Heart:  Normal rate and regular rhythm. S1 and S2 normal without gallop, murmur, click, rub or other extra sounds. Abdomen:  soft, non-tender, normal bowel sounds, no hepatomegaly, and no splenomegaly.   Extremities:  No lower extremity edema  Neurologic:  cranial nerves II-XII intact and gait normal.   Psych:  normally interactive, good eye contact, not anxious appearing, and not depressed appearing.     Impression & Recommendations:  Problem # 1:  PALPITATIONS (ICD-785.1) 45 y/o with intermittent palpitations.  EKG is benign.   Check labs and 2D Echo.  I doubt structural heart disease.  Pt advised to avoid caffeine and all artificial sweetners. Patient advised to call office if symptoms persist or worsen.  Orders: T-Basic Metabolic Panel [phone]) T-CBC w/Diff [id number] T-TSH [id number]) T-T4, Free [phone]) Echo Referral (Echo)  Complete Medication List: 1)  Multivitamins Tabs (Multiple vitamin) .... Once daily 2)  Nuvaring 0.12-0.015 Mg/24hr Ring (  Etonogestrel-ethinyl estradiol) .... As directed 3)  Calcium-vitamin D 500-200 Mg-unit Tabs (Calcium carbonate-vitamin d) .... Take 1 tablet by mouth once a day  Patient Instructions: 1)  Please schedule a follow-up appointment in 1 month. 2)  Avoid all caffeinated beverages. 3)  Avoid all artificial sweetners. 4)  Our office will contact you to schedule an echocardiogram.   Patient Consent for Use and Disclosure of Protected Health Information Patient: Nicole Crosby   Completed by: Glendell Docker CMA Date Completed: 06/18/2009   Current Allergies (reviewed today): ! SULFA

## 2010-12-03 NOTE — Assessment & Plan Note (Signed)
Summary: NEW CPX   Vital Signs:  Patient Profile:   45 Years Old Female Height:     61 inches Weight:      134.75 pounds BMI:     25.55 Temp:     98.5 degrees F oral Pulse rate:   82 / minute Pulse rhythm:   regular Resp:     18 per minute BP sitting:   140 / 79  (right arm) Cuff size:   regular  Vitals Entered By: Glendell Docker CMA (July 16, 2008 1:40 PM)                 PCP:  [name]  Chief Complaint:  New Patient.  History of Present Illness: New patient c/o ongoing abdominal discomfort.  Pt evaluated by Dr. Leone Payor with presumed diagnosis of IBS.  Pt refused EGD and colonosocopy.  Pt complains of wt gain in 18 months despite diet and exercise.  Pt describes post prandial abd pain between epigastric area and umbilicus.  Pt also has hx of chronic gerd.  She reports normal barium swallow in the past.  Normal H. Pylori and celiac sprue antibody.   She has been taking Nexium regularly.    Current Allergies (reviewed today): ! SULFA  Past Medical History:    GERD    Irritable Bowel Syndrome   Past Surgical History:    C-section -2002    DeQuervain Surgery 2000    Family History:    Family History of Prostate Cancer:Father deceased    Family History of Diabetes: Father, Both Grandmothers, 2 Sisters    Family History of Heart Disease: Father (MI 91), Grandmother    GU cancer - no    Colon ca - uncle (died late 50's)   Risk Factors:  Mammogram History:     Date of Last Mammogram:  11/09/2007    Results:  normal    Review of Systems       The patient complains of weight gain.  The patient denies fever, chest pain, dyspnea on exertion, prolonged cough, melena, and hematochezia.     Physical Exam  General:     alert, well-developed, and well-nourished.   Head:     normocephalic and atraumatic.   Eyes:     vision grossly intact, pupils equal, pupils round, and pupils reactive to light.   Ears:     R ear normal and L ear normal.    Mouth:     No deformity or lesions, dentition normal. Neck:     supple, no masses, and no carotid bruits.   Lungs:     normal respiratory effort, normal breath sounds, no crackles, and no wheezes.   Heart:     normal rate, regular rhythm, no murmur, and no gallop.   Abdomen:      lower abd tenderness.  ? left pelvic fullnesssoft, no hepatomegaly, and no splenomegaly.   Extremities:     No clubbing, cyanosis, edema Neurologic:     alert & oriented X3, cranial nerves II-XII intact, and gait normal.      Impression & Recommendations:  Problem # 1:  HEALTH MAINTENANCE EXAM (ICD-V70.0) Reviewed adult health maintenance protocols.  We discussed weight loss strategies.   Problem # 2:  ABDOMINAL BLOATING (ICD-787.3) Abd symptoms likely due to IBS.  Rule out gallbladder disease.  ? left pelvic fullness.  Check CA 125 and obtain transvaginal U/S. Trial of levbid.   Orders: Radiology Referral (Radiology)   Problem # 3:  GERD (ICD-530.81) Pt would like to get off Nexium if possible.  I rec transition to OTC zantac and taper over 2-4 wks. Her updated medication list for this problem includes:    Nexium 40 Mg Cpdr (Esomeprazole magnesium) ..... Once daily    Levbid 0.375 Mg Xr12h-tab (Hyoscyamine sulfate) ..... One by mouth q12 hrs prn   Complete Medication List: 1)  Nexium 40 Mg Cpdr (Esomeprazole magnesium) .... Once daily 2)  Multivitamins Tabs (Multiple vitamin) .... Once daily 3)  Nuvaring 0.12-0.015 Mg/24hr Ring (Etonogestrel-ethinyl estradiol) .... As directed 4)  Levbid 0.375 Mg Xr12h-tab (Hyoscyamine sulfate) .... One by mouth q12 hrs prn   Patient Instructions: 1)  Please schedule a follow-up appointment in 1 month. 2)  Lipid Panel prior to visit, ICD-9:  272.4 3)  HbgA1C prior to visit, ICD-9:  790.29 4)  CA 125:   5)  Please return for lab work one (1) week before your next appointment.  (Fasting) 6)  Take fiber supplement twice daily.   Prescriptions: LEVBID  0.375 MG XR12H-TAB (HYOSCYAMINE SULFATE) one by mouth q12 hrs prn  #60 x 2   Entered and Authorized by:   D. Thomos Lemons DO   Signed by:   D. Thomos Lemons DO on 07/16/2008   Method used:   Electronically to        [facility]* (retail)       [address]       [address] Shipping/mailing       [place], [place]  [address]       Ph: [phone]       Fax: [phone]   RxID:   [id number]  ] Current Allergies (reviewed today): ! SULFA   Preventive Care Screening  Mammogram:    Date:  11/09/2007    Results:  normal   Last Flu Shot:    Date:  08/31/2006    Results:  given   Last Tetanus Booster:    Date:  01/04/2006    Results:  given

## 2010-12-03 NOTE — Assessment & Plan Note (Signed)
Summary: abd pain/mhf   Vital Signs:  Patient profile:   45 year old female Weight:      140 pounds BMI:     26.55 O2 Sat:      100 % on Room air Temp:     98.3 degrees F oral Pulse rate:   82 / minute Pulse rhythm:   regular Resp:     16 per minute BP sitting:   120 / 90  (left arm) Cuff size:   regular  Vitals Entered By: Glendell Docker CMA (May 20, 2010 4:09 PM)  O2 Flow:  Room air CC: Rm3- Abdominal discomfort, Abdominal pain Is Patient Diabetic? No Pain Assessment Patient in pain? no      Comments onset yesterday after eating lunch, had adominal discomfort-upper abdomen, feel like gas, bloating, nausea, and rectal pressure, co chest pressure and tightness left of midline radiating to back ongoing since the end of May, occurs about 4 times during the week random occurrence   Primary Care Provider:  D. Thomos Lemons DO  CC:  Rm3- Abdominal discomfort and Abdominal pain.  History of Present Illness:  Abdominal Pain      This is a 45 year old woman who presents with Abdominal pain.  The patient denies vomiting and constipation.  The location of the pain is epigastric.  The pain is described as intermittent, cramping in quality, and radiating to the back.  Associated symptoms include chest pain.  The patient denies the following symptoms: fever.    She notes her symptoms not like her prev IBS symtpoms.   Her symptoms started yest after eating salad with tuna fish.  Food feels like it's "stuck" , "not moving along".  4 BMs yesterday. Epigastric radiates to back.  Also having intermittent chest pain / tightness.  Never exertional.   symptoms can occur at night and while driving.  good compliance with PPI.     EKG  Procedure date:  05/20/2010  Findings:      Normal sinus rhythm with rate of:  79 bpm.  Normal ECG  Allergies: 1)  ! Sulfa  Past History:  Past Medical History: Current Problems:  INSOMNIA (ICD-780.52) HEPATIC CYST (ICD-573.8)   GERD (ICD-530.81)     IRRITABLE BOWEL SYNDROME (ICD-564.1)  HIATAL HERNIA (ICD-553.3) Tiny hypodensity left hepatic lobe - CT of abd and Pelvis 07/26/2008    Small 8mm periumblical hernia        Past Surgical History: C-section -2002 DeQuervain Surgery 2000            Family History: Family History of Prostate Cancer:Father deceased Family History of Diabetes: Father, Both Grandmothers, 2 Sisters Family History of Heart Disease: Father (MI 47), Grandmother GU cancer - no Colon ca - uncle (died late 34's)          Social History: Occupation: Engineer, 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  Alcohol Use - yes-wine       Full Time  Married       Physical Exam  General:  alert, well-developed, and well-nourished.   Mouth:  pharynx pink and moist.   Lungs:  normal respiratory effort and normal breath sounds.   Heart:  normal rate, regular rhythm, and no gallop.   Abdomen:  slightly distended,  diffuse tenderness with deep palpation,  soft, normal bowel sounds, no masses, no guarding, and no rigidity.   Extremities:  No lower extremity edema  Neurologic:  cranial nerves II-XII intact.     Impression &  Recommendations:  Problem # 1:  ABDOMINAL PAIN, EPIGASTRIC (ICD-789.06) recurrent abd pain of unclear etiology.  consider biliary dyskinesia.  arrange HIDA.  previously seen by Dr. Leone Payor.  She deferred EGD.  Considering "full sensation", she is ready to proceed with EGD.  change nexium to zegerid. trial of librax.  Orders: T-Basic Metabolic Panel [id number]) T-Hepatic Function [id number]) T-Lipase [id number] T-CBC w/Diff [id number]) Ultrasound (Ultrasound) Radiology other (Radiology Other) Gastroenterology Referral (GI)  Complete Medication List: 1)  Multivitamins Tabs (Multiple vitamin) .... Once daily 2)  Nuvaring 0.12-0.015 Mg/24hr Ring (Etonogestrel-ethinyl estradiol) .... As directed 3)  Omeprazole-sodium Bicarbonate 40-1100 Mg Caps (Omeprazole-sodium bicarbonate) .... One  by mouth two times a day 4)  Amitriptyline Hcl 25 Mg Tabs (Amitriptyline hcl) .... 1/2 to one tab by mouth at bedtime prn 5)  Advil 200 Mg Tabs (Ibuprofen) .... As needed 6)  Tylenol 325 Mg Tabs (Acetaminophen) .... As needed 7)  Clidinium-chlordiazepoxide 2.5-5 Mg Caps (Clidinium-chlordiazepoxide) .... One by mouth two times a day as needed for abdominal pain  Patient Instructions: 1)  Please schedule a follow-up appointment in 2 weeks. 2)  Call our office if your symptoms do not  improve or gets worse. [pPrescriptions: CLIDINIUM-CHLORDIAZEPOXIDE 2.5-5 MG CAPS (CLIDINIUM-CHLORDIAZEPOXIDE) one by mouth two times a day as needed for abdominal pain  #30 x 0   Entered and Authorized by:   D. Thomos Lemons DO   Signed by:   D. Thomos Lemons DO on 05/20/2010   Method used:   Electronically to        [facility]* (retail)       [address]       [address] Shipping/mailing       [place], [place]  [address]       Ph: [phone]       Fax: [phone]   RxID:   [id number] OMEPRAZOLE-SODIUM BICARBONATE 40-1100 MG CAPS (OMEPRAZOLE-SODIUM BICARBONATE) one by mouth two times a day  #60 x 2   Entered and Authorized by:   D. Thomos Lemons DO   Signed by:   D. Thomos Lemons DO on 05/20/2010   Method used:   Electronically to        [facility]* (retail)       [address]       [address] [place] [address]mailing       [place], [place]  [address]       Ph: [phone]       Fax: [phone]   RxID:   [id number]   Current Allergies (reviewed today): ! SULFA

## 2010-12-03 NOTE — Progress Notes (Signed)
Summary: Building services engineer Visit/gastro/[facility]   Imported By: Lester Maybell 06/06/2008 11:37:46  _____________________________________________________________________  External Attachment:    Type:   Image     Comment:   External Document

## 2010-12-03 NOTE — Assessment & Plan Note (Signed)
Summary: ABD PAIN X 6 MOTHS.Marland KitchenEM   History of Present Illness Visit Type: self referral Primary GI MD: Stan Head MD [facility] Primary Provider: Dewain Penning, MD Chief Complaint: abdominal pain History of Present Illness:   Several months of upper abdominal pain, bloating, distention, epigastric burning. Increased flatus, bowel movements more frequent but formed. Does not think BM's and pain related. Nexium is controlling heartburn. No nocturnal sxs. Sleeps ok with half a xanax but in general has been a poor sleeper. Has been gaining some weight and was told that was inevitable essentially by GYN. She tried to exercise and eat less but was very hungry and gained 5 # . Has to snack every 2 hrs because if she eats a large meal gets very bloated.             Prior Medications Reviewed Using: Patient Recall  Updated Prior Medication List: NEXIUM 40 MG  CPDR (ESOMEPRAZOLE MAGNESIUM) once daily MULTIVITAMINS   TABS (MULTIPLE VITAMIN) once daily CALCIUM 500/D 500-200 MG-UNIT  TABS (CALCIUM CARBONATE-VITAMIN D) once daily NUVARING 0.12-0.015 MG/24HR  RING (ETONOGESTREL-ETHINYL ESTRADIOL) as directed  Current Allergies: ! SULFA  Past Medical History:    GERD    Irritable Bowel Syndrome  Past Surgical History:    Reviewed history from 05/30/2008 and no changes required:       C-section -2002       DeQuervain Surgery 2000   Family History:    Reviewed history from 05/30/2008 and no changes required:       Family History of Prostate Cancer:Father       Family History of Diabetes: FAther, Both Grandmothers, 2 Sisters       Family History of Heart Disease: FAther Surveyor, minerals  Social History:    Reviewed history from 05/30/2008 and no changes required:       Occupation: Engineer, structural       Patient has never smoked.        Alcohol Use - yes-wine    Review of Systems       back pain, palpitations, dyspnea, pedal edema   Vital Signs:  Patient Profile:   45 Years Old Female Height:      61 inches Weight:      135 pounds BMI:     25.60 Pulse rate:   72 / minute Pulse rhythm:   regular BP sitting:   120 / 78  (left arm)  Vitals Entered By: June McMurray CMA (June 01, 2008 3:21 PM)                  Physical Exam  General:     Well developed, well nourished, no acute distress. Head:     Normocephalic and atraumatic. Eyes:     PERRLA, no icterus. Mouth:     No deformity or lesions, dentition normal. Neck:     Supple; no masses or thyromegaly. Lungs:     Clear throughout to auscultation. Heart:     Regular rate and rhythm; no murmurs, rubs,  or bruits. Abdomen:     Soft, nontender and nondistended. No masses, hepatosplenomegaly or hernias noted. Normal bowel sounds. Extremities:     No clubbing, cyanosis, edema or deformities noted. Neurologic:     Alert and  oriented x4;  grossly normal neurologically. Cervical Nodes:     No significant cervical or supraclavicular adenopathy.  Psych:     Alert and cooperative. Normal mood and affect.    Impression & Recommendations:  Problem # 1:  ABDOMINAL  PAIN, EPIGASTRIC (ICD-789.06) Seems to be a recurrent problem, weight is increasing and no dysphagia, bleeding. EGD is reasonable and was discussed, to look for causes other than functional disturbance. She understands possibility of cancer, though low likelihood. She will consider it but current plan is to continue Nexium and add align once daily. REV 6 weeks unless completely better. Will call lab results. Also consider screening for sprue. She is planning on new PCP, also. Other therapy could include round of empiric antibiotics, ant-spasmodics, buspirone. Orders: TLB-CBC Platelet - w/Differential (85025-CBCD) TLB-CMP (Comprehensive Metabolic Pnl) (80053-COMP) TLB-TSH (Thyroid Stimulating Hormone) (84443-TSH) TLB-Amylase (82150-AMYL) TLB-Lipase (83690-LIPASE)   Problem # 2:  ABDOMINAL BLOATING (ICD-787.3) Assessment: Comment Only see #1  Problem # 3:   IRRITABLE BOWEL SYNDROME (ICD-564.1) Assessment: Comment Only I think her symptoms are most likely due to this, has had problems back to the 1990's.     ]

## 2010-12-03 NOTE — Progress Notes (Signed)
Summary: Test Results  Phone Note Outgoing Call   Summary of Call: call pt - 2D Echo normal Initial call taken by: D. Thomos Lemons DO,  June 30, 2009 10:13 PM  Follow-up for Phone Call        patient advised per Dr Artist Pais instructions Follow-up by: Glendell Docker CMA,  July 01, 2009 9:44 AM

## 2010-12-03 NOTE — Progress Notes (Signed)
Summary: Lab results  Phone Note Outgoing Call   Summary of Call: call pt - blood work normal (kidney function, LFTs,  pancreatic enzyme) Initial call taken by: D. Thomos Lemons DO,  May 21, 2010 9:03 AM  Follow-up for Phone Call        patient advised per Dr Artist Pais instructiions Follow-up by: Glendell Docker CMA,  May 21, 2010 9:44 AM

## 2010-12-03 NOTE — Medication Information (Signed)
Summary: Prior Authorization for Omeprazole/[employer]  Prior Authorization for Omeprazole/[employer]   Imported By: Lanelle Bal 05/29/2010 11:32:25  _____________________________________________________________________  External Attachment:    Type:   Image     Comment:   External Document

## 2010-12-03 NOTE — Miscellaneous (Signed)
Summary: PT Initial Summary/[facility]  PT Initial Summary/[facility]   Imported By: Lanelle Bal 12/12/2009 12:36:13  _____________________________________________________________________  External Attachment:    Type:   Image     Comment:   External Document

## 2010-12-03 NOTE — Assessment & Plan Note (Signed)
Summary: [facility]   Visit Type:  6 weeks follow up Primary Provider:  [name]  CC:  palpitations.  History of Present Illness: Pleasant female I saw in November of 2010 for evaluation of palpitations and chest pain. Previous workup in 2005 apparently normal per patient. She had a recent echocardiogram in August of 2010  that revealed normal LV function. No valvular abnormalities were noted.  Note previous TSH was normal. We scheduled a stress echocardiogram which was performed on October 10, 2009 and was normal. A CardioNet was also performed and showed sinus to sinus tachycardia with rare PAC. Since I saw her in November she has some dyspnea on exertion relieved with rest. She attributes this to inability to exercise due to the weather. It is not associated with chest pain. No orthopnea, PND or pedal edema. She continues to have an occasional "skip" but her palpitations are not sustained and they are improved since I last saw her.   Current Medications (verified): 1)  Multivitamins   Tabs (Multiple Vitamin) .... Once Daily 2)  Nuvaring 0.12-0.015 Mg/24hr  Ring (Etonogestrel-Ethinyl Estradiol) .... As Directed 3)  Calcium-Vitamin D 500-200 Mg-Unit Tabs (Calcium Carbonate-Vitamin D) .... Take 1 Tablet By Mouth Once A Day 4)  Nexium 40 Mg Cpdr (Esomeprazole Magnesium) .... One By Mouth Once Daily 5)  Hyomax-Sr 0.375 Mg Xr12h-Tab (Hyoscyamine Sulfate) .... As Needed 6)  Amitriptyline Hcl 25 Mg Tabs (Amitriptyline Hcl) .... 1/2 To One Tab By Mouth At Bedtime Prn  Allergies: 1)  ! Sulfa  Past History:  Past Medical History: Reviewed history from 09/30/2009 and no changes required. Current Problems:  INSOMNIA (ICD-780.52) HEPATIC CYST (ICD-573.8) GERD (ICD-530.81)  IRRITABLE BOWEL SYNDROME (ICD-564.1) HIATAL HERNIA (ICD-553.3) Tiny hypodensity left hepatic lobe - CT of abd and Pelvis 07/26/2008    Small 8mm periumblical hernia        Past Surgical History: Reviewed  history from 09/30/2009 and no changes required. C-section -2002 DeQuervain Surgery 2000         Social History: Reviewed history from 09/30/2009 and no changes required. 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  Alcohol Use - yes-wine       Full Time  Married   Review of Systems       no fevers or chills, productive cough, hemoptysis, dysphasia, odynophagia, melena, hematochezia, dysuria, hematuria, rash, seizure activity, orthopnea, PND, pedal edema, claudication. Remaining systems are negative.   Vital Signs:  Patient profile:   45 year old female Height:      61 inches Weight:      139 pounds BMI:     26.36 Pulse rate:   72 / minute Pulse rhythm:   regular Resp:     18 per minute BP sitting:   126 / 90  (left arm) Cuff size:   large  Vitals Entered By: Vikki Ports (November 20, 2009 11:59 AM)  Physical Exam  General:  Well-developed well-nourished in no acute distress.  Skin is warm and dry.  HEENT is normal.  Neck is supple. No thyromegaly.  Chest is clear to auscultation with normal expansion.  Cardiovascular exam is regular rate and rhythm.  Abdominal exam nontender or distended. No masses palpated. Extremities show no edema. neuro grossly intact    Impression & Recommendations:  Problem # 1:  PALPITATIONS (ICD-785.1) Symptoms improved. Monitor revealed sinus to sinus tachycardia with a rare PAC. We have discussed that these are benign. I discussed a beta blocker today. We  have elected to not add this at present as she is not extremely symptomatic. We will add this in the future if her symptoms worsen. Note LV function normal.  Problem # 2:  CHEST PAIN, ATYPICAL (ICD-786.59) Stress echocardiogram normal. No further workup.  Problem # 3:  DYSPNEA (ICD-786.05) LV function normal. She attributes this to deconditioning. No further workup at this time. Not volume overloaded on examination.  Problem # 4:  HYPERLIPIDEMIA  (ICD-272.4) Management per primary care.  Problem # 5:  GERD (ICD-530.81)  Her updated medication list for this problem includes:    Nexium 40 Mg Cpdr (Esomeprazole magnesium) ..... One by mouth once daily    Hyomax-sr 0.375 Mg Xr12h-tab (Hyoscyamine sulfate) .Marland Kitchen... As needed  Problem # 6:  IRRITABLE BOWEL SYNDROME (ICD-564.1)  Patient Instructions: 1)  Your physician recommends that you schedule a follow-up appointment in: prn

## 2010-12-03 NOTE — Assessment & Plan Note (Signed)
Summary: 1 month rov-ch   Vital Signs:  Patient profile:   45 year old female Weight:      134.25 pounds BMI:     25.46 Temp:     98.3 degrees F oral Pulse rate:   68 / minute Resp:     18 per minute BP sitting:   120 / 80  (right arm) Cuff size:   regular  Vitals Entered By: Glendell Docker CMA (July 19, 2009 4:02 PM)  Primary Care Provider:  [name]  CC:  1 Month Follow  up.  History of Present Illness: 1 Month Follow up  45 y/o white female for f/u re:  palpitations and chest pain.  Pt kept journal of palpitations.    She stopped all caffeine use.  Palpitations occurred every other day.   She describes non exertional lower sternal/epigastric pain.  No assoc SOB.   CP usually occurs in AM.   She has hx of GERD.  Pt switch to zantac due to concerns of side effects from long term PPI use.  She has reflux symptoms - sour taste and burning sensation of upper throat area.    Labs studies - normal.  2D echo - normal EF.  No wall motion abnormality.  Hx of liver cyst - due to f/u ultrasound.   Allergies: 1)  ! 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  Alcohol Use - yes-wine        Physical Exam  General:  alert, well-developed, and well-nourished.   Neck:  No deformities, masses, or tenderness noted. Lungs:  normal respiratory effort and normal breath sounds.   Heart:  Normal rate and regular rhythm. S1 and S2 normal  without gallop, murmur, click, rub or other extra sounds.   Impression & Recommendations:  Problem # 1:  PALPITATIONS (ICD-785.1) 45 y/o with recurrent palpitations and atypical chest pain.   Normal 2D Echo reassuring.  Arrange holter monitor and cardiology referral.  If PAC's, consider low dose B Blocker.  Orders: Cardiology Referral (Cardiology) Cardiology Referral (Cardiology)  Problem # 2:  HEPATIC CYST (ICD-573.8) Previous u/s 08/2008 showed - Two tiny hyperechoic lesion left hepatic lobe probable cysts are noted measures 4 mm and 4 mm.   Arrange f/u scan to ensure stability.  Orders: Ultrasound (Ultrasound)  Problem # 3:  GERD (ICD-530.81) Pt having recurrent reflux symptoms off of PPI.  Resume nexium.  She is not interested in nissen fundoplication.  Her updated medication list for this problem includes:    Nexium 40 Mg Cpdr (Esomeprazole magnesium) ..... One by mouth once daily  Complete Medication List: 1)  Multivitamins Tabs (Multiple vitamin) .... Once daily 2)  Nuvaring 0.12-0.015 Mg/24hr Ring (Etonogestrel-ethinyl estradiol) .... As directed 3)  Calcium-vitamin D 500-200 Mg-unit Tabs (Calcium carbonate-vitamin d) .... Take 1 tablet by mouth once a day 4)  Nexium 40 Mg Cpdr (Esomeprazole magnesium) .... One by  mouth once daily  Patient Instructions: 1)  Our office will contact you re:  arranging holter monitor and referral to Dr. Riley Kill Prescriptions: NEXIUM 40 MG CPDR (ESOMEPRAZOLE MAGNESIUM) one by mouth once daily  #30 x 3   Entered and Authorized by:   D. Thomos Lemons DO   Signed by:   D. Thomos Lemons DO on 07/19/2009   Method used:   Electronically to        [facility]* (retail)       [address]       [address] Shipping/mailing       [place], [place]  [address]       Ph: [phone]       Fax: [phone]   RxID:   [id number]   Current Allergies (reviewed today): ! SULFA

## 2010-12-03 NOTE — Progress Notes (Signed)
Summary: Pharmacy Correction  Phone Note Call from Patient Call back at [phone]   Caller: Patient Summary of Call: patient called and left voice message requesting the prescriptions that were sent yesterday , she would like them sent to [facility]. Initial call taken by: Glendell Docker CMA,  November 26, 2009 9:38 AM  Follow-up for Phone Call        patient was informed rx's sent to [facility] and have been cancelled to the previous pharmacy submitted Follow-up by: Glendell Docker CMA,  November 26, 2009 9:39 AM    Prescriptions: XIFAXAN 550 MG TABS (RIFAXIMIN) one by mouth two times a day  #14 x 0   Entered by:   Glendell Docker CMA   Authorized by:   D. Thomos Lemons DO   Signed by:   Glendell Docker CMA on 11/26/2009   Method used:   Electronically to        [facility]* (retail)       [address]       [address] [place] [address]mailing       [place], [place]  [address]       Ph: [phone]       Fax: [phone]   RxID:   [id number] HYOMAX-SR 0.375 MG XR12H-TAB (HYOSCYAMINE SULFATE) one po bid as needed  #60 x 5   Entered by:   Glendell Docker CMA   Authorized by:   D. Thomos Lemons DO   Signed by:   Glendell Docker CMA on 11/26/2009   Method used:   Electronically to        [facility]* (retail)       [address]       [address] [place] [address]mailing       [place], [place]  [address]       Ph: [phone]       Fax: [phone]   RxID:   [id number]

## 2010-12-03 NOTE — Assessment & Plan Note (Signed)
Summary: sore throat had neg strep test 03/22/10/dt   Vital Signs:  Patient profile:   45 year old female Height:      61 inches Weight:      139.50 pounds BMI:     26.45 O2 Sat:      100 % on Room air Temp:     97.7 degrees F oral Pulse rate:   86 / minute Pulse rhythm:   regular Resp:     18 per minute BP sitting:   104 / 78  (right arm) Cuff size:   regular  Vitals Entered By: Glendell Docker CMA (Mar 27, 2010 1:12 PM)  O2 Flow:  Room air CC: Rm 2- Throat pain   Primary Care Provider:  DThomos Lemons DO  CC:  Rm 2- Throat pain.  History of Present Illness: 45 y/o white female c/o sore throat.  right side of back of throat pain for the past week. rapid strep performed at urgent  Saturday was negative. Pain with swallowing, no temp,no drainage, no nausea or vomiting, no treatment provided at urgent care.     Allergies: 1)  ! 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  Alcohol Use - yes-wine       Full Time  Married     Physical Exam  General:  alert, well-developed, and well-nourished.   Ears:  R ear normal and L ear normal.   Mouth:  pharyngeal erythema and postnasal drip.  small exudate left tonsil Lungs:  normal respiratory effort and normal breath sounds.   Heart:  normal rate, regular rhythm, and no gallop.     Impression & Recommendations:  Problem # 1:  ACUTE  NASOPHARYNGITIS (ICD-460) 1 week of persistent sore throat.  small left tonsilar exudate.  tx with amoxicilin. continue warm salt water gargles  Her updated medication list for this problem includes:    Advil 200 Mg Tabs (Ibuprofen) .Marland Kitchen... As needed    Tylenol 325 Mg Tabs (Acetaminophen) .Marland Kitchen... As needed  Complete Medication List: 1)  Multivitamins Tabs (Multiple vitamin) .... Once daily 2)  Nuvaring 0.12-0.015 Mg/24hr Ring (Etonogestrel-ethinyl estradiol) .... As directed 3)  Nexium 40 Mg Cpdr (Esomeprazole magnesium) .... One by mouth once daily 4)  Amitriptyline Hcl 25 Mg Tabs (Amitriptyline hcl) .... 1/2 to one tab by mouth at bedtime prn 5)  Advil 200 Mg Tabs (Ibuprofen) .... As needed 6)  Tylenol 325 Mg Tabs (Acetaminophen) .... As needed 7)  Hyoscyamine Sulfate Cr 0.375 Mg Xr12h-tab (Hyoscyamine sulfate) .... Take 1 tablet by mouth two times a day as needed 8)  Amoxicillin 500 Mg Tabs (Amoxicillin) .... One by mouth two times a day  Patient Instructions: 1)  Call our office if your symptoms do not  improve or gets worse. Prescriptions: AMOXICILLIN 500 MG TABS (AMOXICILLIN) one by mouth two times a day  #20 x 0   Entered and Authorized  by:   [name]   Signed by:   D. Thomos Lemons DO on 03/27/2010   Method used:   Electronically to        [facility]* (retail)       [address]       [address] [place] [address]mailing       [place], [place]  [address]       Ph: [phone]       Fax: [phone]   RxID:   [id number]

## 2010-12-03 NOTE — Letter (Signed)
Summary: *Consult Note  [facility]  [address]   [place], [place] [address]   Phone: [phone]  Fax:     Re:    Nicole Crosby DOB:    1966-04-16   Dear Dr Artist Pais:    Thank you for requesting that we see the above patient for consultation.  A copy of the detailed office note will be sent under separate cover, for your review.  Evaluation today is consistent with:  Left greater trochanteric bursitis Left iliopsoas external impingement Right medial epicondylitis  Our recommendation is for:  Greater trochanteric injection (done today) Hip abduction, external rotation, and hip flexion exercises IT band stretches Handout provided regarding rehab of medial epicondylitis   After today's visit, the patients current medications include: 1)  MULTIVITAMINS   TABS (MULTIPLE VITAMIN) once daily 2)  NUVARING 0.12-0.015 MG/24HR  RING (ETONOGESTREL-ETHINYL ESTRADIOL) as directed 3)  NEXIUM 40 MG CPDR (ESOMEPRAZOLE MAGNESIUM) one by mouth by mouth once daily 4)  AMITRIPTYLINE HCL 25 MG TABS (AMITRIPTYLINE HCL) 1/2 to one tab by mouth at bedtime prn 5)  ADVIL 200 MG TABS (IBUPROFEN) as needed 6)  TYLENOL 325 MG TABS (ACETAMINOPHEN) as needed 7)  CLIDINIUM-CHLORDIAZEPOXIDE 2.5-5 MG CAPS (CLIDINIUM-CHLORDIAZEPOXIDE) one by mouth two times a day as needed for abdominal pain 8)  TRIAMCINOLONE ACETONIDE 0.1 % CREA (TRIAMCINOLONE ACETONIDE) apply two times a day   Thank you for this consultation.  If you have any further questions regarding the care of this patient, please do not hesitate to contact me @ [phone].  Thank you for this opportunity to look after your patient.  Sincerely,   Norton Blizzard MD

## 2010-12-03 NOTE — Letter (Signed)
Summary: New Patient letter  [facility]  [address] [place], [place] [address]   Phone: [phone]  Fax: [phone]       05/21/2010 MRN: [id number]  [facility] [address] [place], [place]  [address]  Dear Nicole Crosby,  Welcome to the Gastroenterology Division at [facility].    You are scheduled to see Dr.  Russella Dar on 06-09-10 at 11am on the 3rd floor at [facility], [address]  We ask that you try to arrive at our office 15 minutes prior to your appointment time to allow for check-in.  We would like you to complete the enclosed self-administered evaluation form prior to your visit and bring it with you on the day of your appointment.  We will review it with you.  Also, please bring a complete list of all your medications or, if you prefer, bring the medication bottles and we will list them.  Please bring your insurance card so that we may make a copy of it.  If your insurance requires a referral to see a specialist, please bring your referral form from your primary care physician.  Co-payments are due at the time of your visit and may be paid by cash, check or credit card.     Your office visit will consist of a consult with your physician (includes a physical exam), any laboratory testing he/she may order, scheduling of any necessary diagnostic testing (e.g. x-ray, ultrasound, CT-scan), and scheduling of a procedure (e.g. Endoscopy, Colonoscopy) if required.  Please allow enough time on your schedule to allow for any/all of these possibilities.    If you cannot keep your appointment, please call [phone] to cancel or reschedule prior to your appointment date.  This allows [place] the opportunity to schedule an appointment for another patient in need of care.  If you do not cancel or reschedule by 5 p.m. the business day prior to your appointment date, you will be charged a $50.00 late cancellation/no-show fee.    Thank you for choosing [facility] for your medical needs.  We appreciate the opportunity to care for you.  Please visit [place] at our website  to learn more about our practice.                     Sincerely,                                                             The Gastroenterology Division

## 2010-12-03 NOTE — Progress Notes (Signed)
Summary: Ultrasound results  Phone Note Outgoing Call   Summary of Call: call pt - transvag u/s is normal Initial call taken by: D. Thomos Lemons DO,  August 12, 2010 5:33 PM  Follow-up for Phone Call        call placed to patient at [phone], she has been advised per Dr Artist Pais instructions. Follow-up by: Glendell Docker CMA,  August 13, 2010 12:08 PM

## 2010-12-03 NOTE — Assessment & Plan Note (Signed)
Summary: [facility]   Visit Type:  Initial Consult Primary Provider:  [name]  CC:  Palpitations and chest pressure.  History of Present Illness: Pleasant female for evaluation of palpitations and chest pain. Previous workup in 2005 apparently normal per patient. She had a recent echocardiogram in August of 2010  that revealed normal LV function. No valvular abnormalities were noted. The patient states that since February of this year and she has had intermittent chest pain. It is substernal in location without radiation. It is not associated with shortness of breath, nausea or diaphoresis. It lasted for 15 minutes and resolve spontaneously. It is nonexertional, pleuritic or related to food. It is not positional. It resolved spontaneously. She also has palpitations. They are described as her heart racing for several seconds. They are not related to exertion. There is no associated chest pain, shortness of breath or syncope. They do cause a cough and then resolve spontaneously. Because of the above we are asked to further evaluate. No previous TSH was normal.  Current Medications (verified): 1)  Multivitamins   Tabs (Multiple Vitamin) .... Once Daily 2)  Nuvaring 0.12-0.015 Mg/24hr  Ring (Etonogestrel-Ethinyl Estradiol) .... As Directed 3)  Calcium-Vitamin D 500-200 Mg-Unit Tabs (Calcium Carbonate-Vitamin D) .... Take 1 Tablet By Mouth Once A Day 4)  Nexium 40 Mg Cpdr (Esomeprazole Magnesium) .... One By Mouth Once Daily 5)  Hyomax-Sr 0.375 Mg Xr12h-Tab (Hyoscyamine Sulfate) .... As Needed  Allergies: 1)  ! Sulfa  Past History:  Past Medical History: Current Problems:  INSOMNIA (ICD-780.52) HEPATIC CYST (ICD-573.8) GERD (ICD-530.81) IRRITABLE BOWEL SYNDROME (ICD-564.1) HIATAL HERNIA (ICD-553.3) Tiny hypodensity left hepatic lobe - CT of abd and Pelvis 07/26/2008    Small 8mm periumblical hernia        Past Surgical History: Reviewed history from 07/19/2009 and no  changes required. C-section -2002 DeQuervain Surgery 2000        Family History: Reviewed history from 07/19/2009 and no changes required. Family History of Prostate Cancer:Father deceased Family History of Diabetes: Father, Both Grandmothers, 2 Sisters Family History of Heart Disease: Father (MI 58), Grandmother GU cancer - no Colon ca - uncle (died late 49's)     Social History: Reviewed history from 07/19/2009 and no changes required. 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  Alcohol Use - yes-wine       Full Time Married   Review of Systems       Some anxiety and abdominal pain related to IBS but no fevers or chills, productive cough, hemoptysis, dysphasia, odynophagia, melena, hematochezia, dysuria, hematuria, rash, seizure activity, orthopnea, PND, pedal edema, claudication. Remaining systems are negative.   Vital Signs:  Patient profile:   45 year old female Height:      61 inches Weight:      134 pounds BMI:     25.41 Pulse rate:   88 / minute Pulse rhythm:   regular Resp:     18 per minute BP sitting:   140 / 90  (right arm) Cuff size:   regular  Vitals Entered By: Vikki Ports (September 18, 2009 11:53 AM)  Physical Exam  General:  Well developed/well nourished in NAD Skin warm/dry Patient not depressed No peripheral clubbing Back-normal HEENT-normal/normal eyelids Neck supple/normal carotid upstroke bilaterally; no bruits; no JVD; no thyromegaly chest - CTA/ normal expansion CV - RRR/normal S1 and S2; no murmurs, rubs or gallops;  PMI nondisplaced Abdomen -NT/ND, no HSM, no mass, + bowel sounds, no  bruit 2+ femoral pulses, no bruits Ext-no edema, chords, 2+ DP Neuro-grossly nonfocal     EKG  Procedure date:  09/18/2009  Findings:      Normal sinus rhythm at a rate of 89. Nonspecific ST changes.  Impression & Recommendations:  Problem # 1:  CHEST PAIN, ATYPICAL (ICD-786.59) Symptoms atypical. Schedule stress  echocardiogram for risk stratification. Orders: Stress Echo (Stress Echo)  Problem # 2:  PALPITATIONS (ICD-785.1) We'll further evaluate with event monitor. Orders: Event (Event)  Problem # 3:  HYPERLIPIDEMIA (ICD-272.4) Management per primary care.  Problem # 4:  IRRITABLE BOWEL SYNDROME (ICD-564.1)  Problem # 5:  GERD (ICD-530.81)  Her updated medication list for this problem includes:    Nexium 40 Mg Cpdr (Esomeprazole magnesium) ..... One by mouth once daily    Hyomax-sr 0.375 Mg Xr12h-tab (Hyoscyamine sulfate) .Marland Kitchen... As needed  Patient Instructions: 1)  Your physician recommends that you schedule a follow-up appointment in:6 weeks  2)  Your physician has requested that you have a stress echocardiogram. For further information please visit [email or web address]  Please follow instruction sheet as given. 3)  Your physician has recommended that you wear an event monitor.  Event monitors are medical devices that record the heart's electrical activity. Doctors most often use these monitors to diagnose arrhythmias. Arrhythmias are problems with the speed or rhythm of the heartbeat. The monitor is a small, portable device. You can wear one while you do your normal daily activities. This is usually used to diagnose what is causing palpitations/syncope (passing out).

## 2010-12-03 NOTE — Assessment & Plan Note (Signed)
Summary: HEADACHES-CH   Vital Signs:  Patient Profile:   45 Years Old Female Height:     61 inches Weight:      133 pounds BMI:     25.22 Temp:     98.2 degrees F oral Pulse rate:   84 / minute Pulse rhythm:   regular Resp:     18 per minute BP sitting:   124 / 80  (right arm) Cuff size:   regular  Vitals Entered By: Glendell Docker CMA (September 21, 2008 8:56 AM)                 PCP:  [name]  Chief Complaint:  Headache.  History of Present Illness: c/o headache x 1 day with nausea.  She has taken tylenol and ibuprofen with no reilef. She denies sensitivity to light and sound.   Location of headache - bitemporal.  Severity - varies from 5-8.   She is having her period.  Drank wine last night.  No fever or focal neurologic changes.    Current Allergies (reviewed today): ! 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     Alcohol Use - yes-wine    Risk Factors:  PAP Smear History:     Date of Last PAP Smear:  08/27/2008    Results:  normal     Physical Exam  General:     alert, well-developed, and well-nourished.   Head:     normocephalic and atraumatic.   Eyes:     vision grossly intact, pupils equal, pupils round, and pupils reactive to light.  no nystagmus Ears:     R ear normal and L ear normal.   Neck:     supple, no masses, and no carotid bruits.   Lungs:     normal respiratory effort and normal breath sounds.   Heart:     normal rate, regular rhythm, and no gallop.      Impression & Recommendations:  Problem # 1:  HEADACHE (ICD-784.0) Migraine vs tension migraine.   Phenergen 12.5 mg three times a day as needed.   Samples of skelaxin 800 mg  by mouth three times a day as needed provided.  Pt advised to avoid wine.  Neuro exam non focal.  Patient advised to call office if symptoms persist or worsen.   Complete Medication List: 1)  Nexium 40 Mg Cpdr (Esomeprazole magnesium) .... Once daily 2)  Multivitamins Tabs (Multiple vitamin) .... Once daily 3)  Nuvaring 0.12-0.015 Mg/24hr Ring (Etonogestrel-ethinyl estradiol) .... As directed 4)  Hyoscyamine Sulfate 0.125 Mg Tabs (Hyoscyamine sulfate) .... One by mouth q4 hrs as needed. 5)  Promethazine Hcl 12.5 Mg Tabs (Promethazine hcl) .... One by mouth three times a day prn 6)  Skelaxin 800 Mg Tabs (Metaxalone) .... One by mouth three times a day prn   Patient Instructions: 1)  Call our office if your symptoms do not  improve or gets worse.   Prescriptions: PROMETHAZINE HCL 12.5 MG TABS (PROMETHAZINE HCL) one by mouth three times a day prn  #30 x 0  Entered and Authorized by:   D. Thomos Lemons DO   Signed by:   D. Thomos Lemons DO on 09/21/2008   Method used:   Faxed to ...       [facility]  [address]  [id number] (retail)       [id number]       [place], [place]  [address]       Ph: [phone] or [phone]       Fax: [phone]   RxID:   [id number] PROMETHAZINE HCL 12.5 MG TABS (PROMETHAZINE HCL) one by mouth three times a day prn  #30 x 0   Entered and Authorized by:   D. Thomos Lemons DO   Signed by:   D. Thomos Lemons DO on 09/21/2008   Method used:   Print then Give to Patient   RxID:   [id number]  ] Current Allergies (reviewed today): ! SULFA   Preventive Care Screening  Pap Smear:    Date:  08/27/2008    Results:  normal

## 2010-12-16 ENCOUNTER — Other Ambulatory Visit: Payer: Self-pay | Admitting: Internal Medicine

## 2010-12-16 DIAGNOSIS — Z139 Encounter for screening, unspecified: Secondary | ICD-10-CM

## 2010-12-31 ENCOUNTER — Ambulatory Visit (HOSPITAL_BASED_OUTPATIENT_CLINIC_OR_DEPARTMENT_OTHER)
Admission: RE | Admit: 2010-12-31 | Discharge: 2010-12-31 | Disposition: A | Discharge status: Home / Self Care | Payer: 59 | Source: Ambulatory Visit | Attending: Internal Medicine | Admitting: Internal Medicine

## 2010-12-31 DIAGNOSIS — Z1231 Encounter for screening mammogram for malignant neoplasm of breast: Secondary | ICD-10-CM | POA: Insufficient documentation

## 2010-12-31 DIAGNOSIS — Z139 Encounter for screening, unspecified: Secondary | ICD-10-CM

## 2011-01-08 ENCOUNTER — Telehealth: Payer: Self-pay | Admitting: Internal Medicine

## 2011-01-13 NOTE — Progress Notes (Signed)
Summary: refill to [facility]   Phone Note Refill Request Message from:  Pharmacy on January 08, 2011 8:28 AM  Refills Requested: Medication #1:  NEXIUM 40 MG CPDR one by mouth by mouth once daily   Dosage confirmed as above?Dosage Confirmed   Brand Name Necessary? No   Supply Requested: 3 months   Last Refilled: 05/21/2010  Medication #2:  CLIDINIUM-CHLORDIAZEPOXIDE 2.5-5 MG CAPS one by mouth two times a day as needed for abdominal pain   Dosage confirmed as above?Dosage Confirmed   Brand Name Necessary? No   Supply Requested: 3 months   Last Refilled: 05/21/2010 [facility]  fax [phone] phone [phone]   Method Requested: Electronic Next Appointment Scheduled: none Initial call taken by: Roselle Locus,  January 08, 2011 8:29 AM  Follow-up for Phone Call        ok to refill x 3 Follow-up by: D. Thomos Lemons DO,  January 08, 2011 4:48 PM    Prescriptions: CLIDINIUM-CHLORDIAZEPOXIDE 2.5-5 MG CAPS (CLIDINIUM-CHLORDIAZEPOXIDE) one by mouth two times a day as needed for abdominal pain  #30 x 3   Entered by:   Glendell Docker CMA   Authorized by:   D. Thomos Lemons DO   Signed by:   Mervin Kung CMA (AAMA) on 01/09/2011   Method used:   Electronically to        [facility]* (retail)       [address]       [address]       [place], [place]  [address]       Ph: [phone]       Fax: [phone]   RxID:   [id number] NEXIUM 40 MG CPDR (ESOMEPRAZOLE MAGNESIUM) one by mouth by mouth once daily  #30 x 3   Entered by:   Glendell Docker CMA   Authorized by:   D. Thomos Lemons DO   Signed by:   Mervin Kung CMA (AAMA) on 01/09/2011   Method used:   Electronically to        [facility]* (retail)       [address]       [address]       [place], [place]  [address]       Ph: [phone]       Fax: [phone]   RxID:   [id number]   Appended Document: refill to  [facility]  Rxs were transferred to [facility] per Outpt pharmacy earlier today.

## 2011-02-25 ENCOUNTER — Encounter: Payer: Self-pay | Admitting: Cardiology

## 2011-02-25 ENCOUNTER — Telehealth: Payer: Self-pay | Admitting: Physician Assistant

## 2011-02-25 ENCOUNTER — Ambulatory Visit (INDEPENDENT_AMBULATORY_CARE_PROVIDER_SITE_OTHER): Payer: 59 | Admitting: Cardiology

## 2011-02-25 ENCOUNTER — Other Ambulatory Visit: Payer: Self-pay | Admitting: Cardiology

## 2011-02-25 DIAGNOSIS — R0789 Other chest pain: Secondary | ICD-10-CM

## 2011-02-25 DIAGNOSIS — R079 Chest pain, unspecified: Secondary | ICD-10-CM

## 2011-02-25 DIAGNOSIS — K219 Gastro-esophageal reflux disease without esophagitis: Secondary | ICD-10-CM

## 2011-02-25 DIAGNOSIS — K589 Irritable bowel syndrome without diarrhea: Secondary | ICD-10-CM

## 2011-02-25 LAB — D-DIMER, QUANTITATIVE (NOT AT ARMC): D-Dimer, Quant: 0.57 ug/mL-FEU — ABNORMAL HIGH (ref 0.00–0.48)

## 2011-02-25 NOTE — Patient Instructions (Signed)
Your physician has requested that you have a stress echocardiogram. For further information please visit [email or web address]. Please follow instruction sheet as given.   

## 2011-02-25 NOTE — Assessment & Plan Note (Signed)
Continue present medications. 

## 2011-02-25 NOTE — Assessment & Plan Note (Signed)
2 types of chest pain. One is pleuritic. I will arrange a d-dimer although I think pulmonary embolus is unlikely. She also has chest tightness with atypical symptoms. No risk factors. Plan stress echocardiogram. Note electrocardiogram normal.

## 2011-02-25 NOTE — Telephone Encounter (Signed)
Schedule cta of chest to rule out pulmonary embolus if no dye allergy Nicole Crosby

## 2011-02-25 NOTE — Progress Notes (Signed)
[id number] female I saw in November of 2010 for evaluation of palpitations and chest pain. Previous workup in 2005 apparently normal per patient. She had a recent echocardiogram in August of 2010  that revealed normal LV function. No valvular abnormalities were noted.  Note previous TSH was normal. We scheduled a stress echocardiogram which was performed on October 10, 2009 and was normal. A CardioNet was also performed and showed sinus to sinus tachycardia with rare PAC. I last saw her in Jan 2011. The patient presents today as an add on with complaints of chest pain. There are 2 types. One is in the left shoulder. It increases with inspiration but not movements. She does not notice this without inspiration. She also has had chest tightness since March. It is substernal with radiation to the back. It lasts 30 minutes and resolved spontaneously. It is not pleuritic, positional or exertional. It is not related to food. There are no associated symptoms. Her last episode was 5 days ago. Note there is mild dyspnea. She has not traveled recently and not injured her legs.  Current Outpatient Prescriptions  Medication Sig Dispense Refill  . amitriptyline (ELAVIL) 25 MG tablet Take 25 mg by mouth at bedtime as needed.        . chlordiazePOXIDE (LIBRIUM) 10 MG capsule Take 10 mg by mouth 3 (three) times daily as needed.        Marland Kitchen esomeprazole (NEXIUM) 40 MG capsule Take 40 mg by mouth daily before breakfast.        . etonogestrel-ethinyl estradiol (NUVARING) 0.12-0.015 MG/24HR vaginal ring Place 1 each vaginally every 28 (twenty-eight) days. Insert vaginally and leave in place for 3 consecutive weeks, then remove for 1 week.       . Melatonin 3 MG CAPS Take 1 capsule by mouth as needed.        . Multiple Vitamin (MULTIVITAMIN) tablet Take 1 tablet by mouth daily.           Past Medical History  Diagnosis Date  . IBS (irritable bowel syndrome)   . Hiatal hernia   . GERD (gastroesophageal reflux disease)      Past Surgical History  Procedure Date  . Cesarean section     History   Social History  . Marital Status: Married    Spouse Name: N/A    Number of Children: N/A  . Years of Education: N/A   Occupational History  . Not on file.   Social History Main Topics  . Smoking status: Never Smoker   . Smokeless tobacco: Not on file  . Alcohol Use: Yes     occasional  . Drug Use: No  . Sexually Active: Not on file   Other Topics Concern  . Not on file   Social History Narrative  . No narrative on file    ROS: Complains of fatigue but no fevers or chills, productive cough, hemoptysis, dysphasia, odynophagia, melena, hematochezia, dysuria, hematuria, rash, seizure activity, orthopnea, PND, pedal edema, claudication. Remaining systems are negative.  Physical Exam: Well-developed well-nourished in no acute distress.  Skin is warm and dry.  HEENT is normal.  Neck is supple. No thyromegaly.  Chest is clear to auscultation with normal expansion.  Cardiovascular exam is regular rate and rhythm.  Abdominal exam nontender or distended. No masses palpated. Extremities show no edema. neuro grossly intact  ECG Normal sinus rhythm at a rate of 89. No ST changes.

## 2011-02-25 NOTE — Assessment & Plan Note (Signed)
Management per primary care. 

## 2011-02-25 NOTE — Telephone Encounter (Signed)
Received phone call re: stat labs on [facility]. D-dimer mildly elevated at 0.57. Reviewed note re: atypical chest pain, not tachycardic, stable BP. Discussed with on-call MD Dr. Myrtis Ser who does not feel any urgent intervention is necessarily (pt seen in the outpatient setting). We will notify Dr. Jens Som in the morning, as well as leave a message for Trish to make sure this information gets passed on.

## 2011-02-25 NOTE — Telephone Encounter (Signed)
Received phone call re: stat labs on Nicole Crosby. D-dimer mildly elevated at 0.57. Reviewed note re: atypical chest pain, not tachycardic, stable BP. Discussed with on-call MD Dr. Katz who does not feel any urgent intervention is necessarily (pt seen in the outpatient setting). We will notify Dr. Crenshaw in the morning, as well as leave a message for Trish to make sure this information gets passed on.   

## 2011-02-26 ENCOUNTER — Other Ambulatory Visit: Payer: Self-pay | Admitting: Cardiology

## 2011-02-26 ENCOUNTER — Ambulatory Visit (HOSPITAL_BASED_OUTPATIENT_CLINIC_OR_DEPARTMENT_OTHER)
Admission: RE | Admit: 2011-02-26 | Discharge: 2011-02-26 | Disposition: A | Discharge status: Home / Self Care | Payer: 59 | Source: Ambulatory Visit | Attending: Cardiology | Admitting: Cardiology

## 2011-02-26 DIAGNOSIS — R079 Chest pain, unspecified: Secondary | ICD-10-CM | POA: Insufficient documentation

## 2011-02-26 DIAGNOSIS — M25519 Pain in unspecified shoulder: Secondary | ICD-10-CM | POA: Insufficient documentation

## 2011-02-26 DIAGNOSIS — I2699 Other pulmonary embolism without acute cor pulmonale: Secondary | ICD-10-CM

## 2011-02-26 DIAGNOSIS — R110 Nausea: Secondary | ICD-10-CM | POA: Insufficient documentation

## 2011-02-26 DIAGNOSIS — R9389 Abnormal findings on diagnostic imaging of other specified body structures: Secondary | ICD-10-CM | POA: Insufficient documentation

## 2011-02-26 MED ORDER — IOHEXOL 350 MG/ML SOLN
100.0000 mL | Freq: Once | INTRAVENOUS | Status: AC | PRN
  Administered 2011-02-26: 100 mL via INTRAVENOUS

## 2011-03-02 ENCOUNTER — Encounter: Payer: Self-pay | Admitting: Cardiology

## 2011-03-05 ENCOUNTER — Encounter: Payer: Self-pay | Admitting: Internal Medicine

## 2011-03-05 ENCOUNTER — Ambulatory Visit (INDEPENDENT_AMBULATORY_CARE_PROVIDER_SITE_OTHER): Payer: 59 | Admitting: Family Medicine

## 2011-03-05 ENCOUNTER — Encounter: Payer: Self-pay | Admitting: Family Medicine

## 2011-03-05 VITALS — BP 168/98 | Ht 61.0 in | Wt 136.0 lb

## 2011-03-05 DIAGNOSIS — M542 Cervicalgia: Secondary | ICD-10-CM

## 2011-03-05 DIAGNOSIS — M545 Low back pain, unspecified: Secondary | ICD-10-CM

## 2011-03-05 DIAGNOSIS — M79605 Pain in left leg: Secondary | ICD-10-CM

## 2011-03-05 DIAGNOSIS — M79609 Pain in unspecified limb: Secondary | ICD-10-CM

## 2011-03-05 MED ORDER — PREDNISONE (PAK) 10 MG PO TABS: ORAL_TABLET | ORAL | Status: DC

## 2011-03-05 MED ORDER — MELOXICAM 15 MG PO TABS
15.0000 mg | ORAL_TABLET | Freq: Every day | ORAL | Status: DC
Start: 2011-03-05 — End: 2011-04-16

## 2011-03-05 NOTE — Patient Instructions (Addendum)
You most likely have lumbar radiculopathy (a pinched nerve in your low back). A prednisone dose pack is the best option for immediate relief and may be prescribed with transition to an anti-inflammatory like aleve or meloxicam (if you do not have stomach or kidney issues). Stay as active as possible. Physical therapy has been shown to be helpful as well - start this asap and transition to home exercise program. Strengthening of low back muscles, abdominal musculature are key for long term pain relief. If not improving, will consider further imaging (MRI) and/or other medications (neurontin, lyrica, nortriptyline) that help with pain. Get the x-ray of your low back at your convenience. I think your neck issues are musculoskeletal. Do stretches and exercises. Heat helps more with muscle spasms - 15 minutes at a time 3-4 times a day. Follow up with me in 6 weeks for a recheck.

## 2011-03-06 ENCOUNTER — Ambulatory Visit: Payer: 59 | Admitting: Internal Medicine

## 2011-03-06 ENCOUNTER — Ambulatory Visit (HOSPITAL_BASED_OUTPATIENT_CLINIC_OR_DEPARTMENT_OTHER)
Admission: RE | Admit: 2011-03-06 | Discharge: 2011-03-06 | Disposition: A | Discharge status: Home / Self Care | Payer: 59 | Source: Ambulatory Visit | Attending: Family Medicine | Admitting: Family Medicine

## 2011-03-06 DIAGNOSIS — M79609 Pain in unspecified limb: Secondary | ICD-10-CM | POA: Insufficient documentation

## 2011-03-06 DIAGNOSIS — M545 Low back pain, unspecified: Secondary | ICD-10-CM | POA: Insufficient documentation

## 2011-03-09 ENCOUNTER — Encounter: Payer: Self-pay | Admitting: Family Medicine

## 2011-03-09 DIAGNOSIS — M79605 Pain in left leg: Secondary | ICD-10-CM | POA: Insufficient documentation

## 2011-03-09 NOTE — Progress Notes (Signed)
Subjective:    Patient ID: Nicole Crosby, female    DOB: [birth year], 45 y.o.   MRN: [id number]  HPI  45 yo F here for left leg pain and left neck pain/spasms  1. Left leg pain Patient reports pain in left leg is different than pain she had in past localized to left groin. At last OV she reported for the past 12 years since having her son she has had off and on anterior/lateral hip pain - current pain is different. X-ray and MRI remotely of hip were negative (no arthrogram in past).  Pain felt in left buttock radiating from here toward left groin but also down left leg into foot and toes associated with numbness and tingling. Pain worse with movement. Taking naproxen which is not as helpful as ibuprofen. No bowel/bladder dysfunction. No prior workup on her back and no prior low back problems.  2. Left neck pain Associated spasms No known injury Has been bothering her for 1 1/2 years No radiation into arms. Only localized numbness in areas of pain and spasm.  Past Medical History  Diagnosis Date  . IBS (irritable bowel syndrome)   . Hiatal hernia   . GERD (gastroesophageal reflux disease)   . Insomnia   . Other abnormal glucose     Tiny hypodensity left hepatic lobe-CT of abdomen and pelvis 07/26/2008-small 8mm periumblical hernia  . Hyperlipidemia     Current Outpatient Prescriptions on File Prior to Visit  Medication Sig Dispense Refill  . amitriptyline (ELAVIL) 25 MG tablet Take 25 mg by mouth at bedtime as needed.        . chlordiazePOXIDE (LIBRIUM) 10 MG capsule Take 10 mg by mouth 3 (three) times daily as needed.        Marland Kitchen esomeprazole (NEXIUM) 40 MG capsule Take 40 mg by mouth daily before breakfast.        . etonogestrel-ethinyl estradiol (NUVARING) 0.12-0.015 MG/24HR vaginal ring Place 1 each vaginally every 28 (twenty-eight) days. Insert vaginally and leave in place for 3 consecutive weeks, then remove for 1 week.       . Melatonin 3 MG CAPS Take 1 capsule by mouth  as needed.        . Multiple Vitamin (MULTIVITAMIN) tablet Take 1 tablet by mouth daily.          Past Surgical History  Procedure Date  . Cesarean section   . De quervain's release 2000    Allergies  Allergen Reactions  . Sulfonamide Derivatives     REACTION: Rash    History   Social History  . Marital Status: Married    Spouse Name: N/A    Number of Children: N/A  . Years of Education: N/A   Occupational History  . Not on file.   Social History Main Topics  . Smoking status: Never Smoker   . Smokeless tobacco: Never Used  . Alcohol Use: Yes     occasional  . Drug Use: No  . Sexually Active: Not on file   Other Topics Concern  . 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 Alcohol Use - yes-wine      Full Time Married         Family History  Problem Relation Age of Onset  . Hypertension Mother     alive  . Prostate cancer Father 69    deceased  . Other Brother     unknown  .  Thyroid disease Sister   . Diabetes Sister     x 2   . Colon cancer      uncle  died in late 56's    BP 168/98  Ht 5\' 1"  (1.549 m)  Wt 136 lb (61.689 kg)  BMI 25.70 kg/m2  LMP 02/05/2011  Review of Systems See HPI above.    Objective:   Physical Exam Gen: NAD Back: No gross deformity.  No midline or paraspinal TTP. No TTP SI joint.  L buttock TTP. No greater trochanter TTP. No other TTP low back, lower extremities. FROM without reproduction of pain. Strength 5/5 BLEs.  Negative Fabers, negative piriformis testing  Sensation intact to light touch currently. SLRs and hip logrolls negative. MSRs 2+ and equal bilateral patellar and achilles tendons. No leg length inequality.   Neck: No gross deformity, swelling, or bruising. No midline/bony TTP.  Mild TTP L > R paraspinal cervical regions. FROM - mild pain with full flexion and full lateral rotations. Strength 5/5 BUEs. MSRs 2+ and equal in biceps, triceps,  brachioradialis tendons bilaterally. NVI distally Negative spurlings bilaterally.     Assessment & Plan:  1. Left leg pain - signs and symptoms indicative of lumbar radiculopathy vs sciatica.  Start prednisone with transition to meloxicam.  To get lumbar AP and lateral radiographs to assess for DJD.  Start PT for strengthening program and modalities.  If not improving will consider lumbar MRI.  2. Left neck pain - likely musculoskeletal based on history and exam.  Handout provided with stretches and exercises.  Prednisone and then mobic should help with this as well.  Heat as needed.  PT may help with this also.

## 2011-03-09 NOTE — Assessment & Plan Note (Signed)
signs and symptoms indicative of lumbar radiculopathy vs sciatica.  Start prednisone with transition to meloxicam.  To get lumbar AP and lateral radiographs to assess for DJD.  Start PT for strengthening program and modalities.  If not improving will consider lumbar MRI.

## 2011-03-09 NOTE — Assessment & Plan Note (Signed)
likely musculoskeletal based on history and exam.  Handout provided with stretches and exercises.  Prednisone and then mobic should help with this as well.  Heat as needed.  PT may help with this also.

## 2011-03-10 ENCOUNTER — Ambulatory Visit: Payer: 59 | Attending: Family Medicine | Admitting: Physical Therapy

## 2011-03-10 DIAGNOSIS — M542 Cervicalgia: Secondary | ICD-10-CM | POA: Insufficient documentation

## 2011-03-10 DIAGNOSIS — IMO0001 Reserved for inherently not codable concepts without codable children: Secondary | ICD-10-CM | POA: Insufficient documentation

## 2011-03-11 ENCOUNTER — Ambulatory Visit: Payer: 59 | Admitting: Cardiology

## 2011-03-11 ENCOUNTER — Ambulatory Visit (HOSPITAL_COMMUNITY): Payer: 59 | Attending: Internal Medicine | Admitting: Radiology

## 2011-03-11 ENCOUNTER — Ambulatory Visit (HOSPITAL_COMMUNITY): Payer: 59 | Attending: Cardiology | Admitting: Radiology

## 2011-03-11 DIAGNOSIS — R072 Precordial pain: Secondary | ICD-10-CM

## 2011-03-11 DIAGNOSIS — R079 Chest pain, unspecified: Secondary | ICD-10-CM | POA: Insufficient documentation

## 2011-03-11 DIAGNOSIS — R0989 Other specified symptoms and signs involving the circulatory and respiratory systems: Secondary | ICD-10-CM

## 2011-03-23 ENCOUNTER — Ambulatory Visit: Payer: 59 | Admitting: Physical Therapy

## 2011-03-31 ENCOUNTER — Other Ambulatory Visit (HOSPITAL_COMMUNITY): Payer: 59 | Admitting: Radiology

## 2011-04-01 ENCOUNTER — Ambulatory Visit: Payer: 59 | Admitting: Physical Therapy

## 2011-04-14 ENCOUNTER — Ambulatory Visit: Payer: 59 | Attending: Family Medicine | Admitting: Physical Therapy

## 2011-04-14 DIAGNOSIS — IMO0001 Reserved for inherently not codable concepts without codable children: Secondary | ICD-10-CM | POA: Insufficient documentation

## 2011-04-14 DIAGNOSIS — M542 Cervicalgia: Secondary | ICD-10-CM | POA: Insufficient documentation

## 2011-04-16 ENCOUNTER — Encounter: Payer: Self-pay | Admitting: Family Medicine

## 2011-04-16 ENCOUNTER — Ambulatory Visit (INDEPENDENT_AMBULATORY_CARE_PROVIDER_SITE_OTHER): Payer: 59 | Admitting: Family Medicine

## 2011-04-16 DIAGNOSIS — M542 Cervicalgia: Secondary | ICD-10-CM

## 2011-04-16 DIAGNOSIS — M79605 Pain in left leg: Secondary | ICD-10-CM

## 2011-04-16 DIAGNOSIS — M79609 Pain in unspecified limb: Secondary | ICD-10-CM

## 2011-04-16 NOTE — Assessment & Plan Note (Signed)
With patient's x-rays being essentially normal, doubt she has radiculopathy though could not rule out bulging disc irritating lumbar nerve.  Pain has improved with PT, prednisone.  Will try to continue with conservative therapy - home exercises and stretches.  Advised she consider seeing chiropractor - Dr. Vear Clock - for evaluation for both low back pain and neck pain (ART may be helpful for neck).  Discussed we can consider lumbar MRI if not improving though this is likely to be normal and not show area where ESIs or surgery would be helpful.

## 2011-04-16 NOTE — Assessment & Plan Note (Signed)
likely musculoskeletal based on history and exam.  Continue home exercises and stretches - offered PT for this too but patient declined at this time.  Consider massage, ART.

## 2011-04-16 NOTE — Patient Instructions (Signed)
Continue with your home stretches and exercises. An MRI of your low back is a consideration but is likely to be normal. Consider seeing Dr. Vear Clock (chiropractor) in [place] for an evaluation - if you go, bring the CD of your x-rays. His phone number is [phone].

## 2011-04-16 NOTE — Progress Notes (Signed)
Subjective:    Patient ID: Nicole Crosby, female    DOB: [birth year], 45 y.o.   MRN: [id number]  Neck Pain   Back Pain   45 yo F here for 6 week f/u left leg pain and left neck pain/spasms  Last OV: 1. Left leg pain Patient reports pain in left leg is different than pain she had in past localized to left groin. At last OV she reported for the past 12 years since having her son she has had off and on anterior/lateral hip pain - current pain is different. X-ray and MRI remotely of hip were negative (no arthrogram in past).  Pain felt in left buttock radiating from here toward left groin but also down left leg into foot and toes associated with numbness and tingling. Pain worse with movement. Taking naproxen which is not as helpful as ibuprofen. No bowel/bladder dysfunction. No prior workup on her back and no prior low back problems.  2. Left neck pain Associated spasms No known injury Has been bothering her for 1 1/2 years No radiation into arms. Only localized numbness in areas of pain and spasm.  Today: Patient has completed 4 visits of PT for low back.  Has improved with pain now 3/10 - feels more in left buttock and only rarely now gets shooting numbness down posterior thigh. No groin pain currently. Her x-rays showed normal disc spaces of L-spine. Prednisone helped a great deal then switched to mobic - not much help with mobic. Has good and bad days. Neck is about the same, also intermittent. Feels spasms in bilateral neck and feels like she needs a massage.  Still no radiation into arms.  Past Medical History  Diagnosis Date  . IBS (irritable bowel syndrome)   . Hiatal hernia   . GERD (gastroesophageal reflux disease)   . Insomnia   . Other abnormal glucose     Tiny hypodensity left hepatic lobe-CT of abdomen and pelvis 07/26/2008-small 8mm periumblical hernia  . Hyperlipidemia     Current Outpatient Prescriptions on File Prior to Visit  Medication Sig Dispense  Refill  . amitriptyline (ELAVIL) 25 MG tablet Take 25 mg by mouth at bedtime as needed.        . chlordiazePOXIDE (LIBRIUM) 10 MG capsule Take 10 mg by mouth 3 (three) times daily as needed.        Marland Kitchen esomeprazole (NEXIUM) 40 MG capsule Take 40 mg by mouth daily before breakfast.        . etonogestrel-ethinyl estradiol (NUVARING) 0.12-0.015 MG/24HR vaginal ring Place 1 each vaginally every 28 (twenty-eight) days. Insert vaginally and leave in place for 3 consecutive weeks, then remove for 1 week.       . Melatonin 3 MG CAPS Take 1 capsule by mouth as needed.        . Multiple Vitamin (MULTIVITAMIN) tablet Take 1 tablet by mouth daily.        Marland Kitchen DISCONTD: meloxicam (MOBIC) 15 MG tablet Take 1 tablet (15 mg total) by mouth daily. With food. Start day AFTER finishing prednisone.  30 tablet  1  . DISCONTD: predniSONE (DELTASONE) 10 MG tablet pack 6 tabs po day 1, 5 tabs po day 2, 4 tabs po day 3, 3 tabs po day 4, 2 tabs po day 5, 1 tab po day 6   21 tablet  0    Past Surgical History  Procedure Date  . Cesarean section   . De quervain's release 2000    Allergies  Allergen Reactions  . Sulfonamide Derivatives     REACTION: Rash    History   Social History  . Marital Status: Married    Spouse Name: N/A    Number of Children: N/A  . Years of Education: N/A   Occupational History  . Not on file.   Social History Main Topics  . Smoking status: Never Smoker   . Smokeless tobacco: Never Used  . Alcohol Use: Yes     occasional  . Drug Use: No  . Sexually Active: Not on file   Other Topics Concern  . 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 Alcohol Use - yes-wine      Full Time Married         Family History  Problem Relation Age of Onset  . Hypertension Mother     alive  . Prostate cancer Father 76    deceased  . Other Brother     unknown  . Thyroid disease Sister   . Diabetes Sister     x 2   . Colon cancer        uncle  died in late 70's    BP 143/91  Pulse 80  Temp 98.8 F (37.1 C)  Ht 5\' 1"  (1.549 m)  Wt 127 lb (57.607 kg)  BMI 24.00 kg/m2  Review of Systems  HENT: Positive for neck pain.   Musculoskeletal: Positive for back pain.   See HPI above.    Objective:   Physical Exam  Gen: NAD Back: No gross deformity.  No midline or paraspinal TTP. No TTP SI joint.  L buttock TTP. No greater trochanter TTP. No other TTP low back, lower extremities. FROM without reproduction of pain. Strength 5/5 BLEs.  Negative Fabers, negative piriformis testing  Sensation intact to light touch currently. SLRs and hip logrolls negative. MSRs 2+ and equal bilateral patellar and achilles tendons. No leg length inequality.   Neck: No gross deformity, swelling, or bruising. No midline/bony TTP.  Mild TTP L > R paraspinal cervical regions. FROM - mild pain with full flexion and full lateral rotations. Strength 5/5 BUEs. MSRs 2+ and equal in biceps, triceps, brachioradialis tendons bilaterally. NVI distally Negative spurlings bilaterally.     Assessment & Plan:  1. Left leg pain - With patient's x-rays being essentially normal, doubt she has radiculopathy though could not rule out bulging disc irritating lumbar nerve.  Pain has improved with PT, prednisone.  Will try to continue with conservative therapy - home exercises and stretches.  Advised she consider seeing chiropractor - Dr. Vear Clock - for evaluation for both low back pain and neck pain (ART may be helpful for neck).  Discussed we can consider lumbar MRI if not improving though this is likely to be normal and not show area where ESIs or surgery would be helpful.  2. Left neck pain - likely musculoskeletal based on history and exam.  Continue home exercises and stretches - offered PT for this too but patient declined at this time.  Consider massage, ART.

## 2011-06-08 ENCOUNTER — Other Ambulatory Visit: Payer: Self-pay | Admitting: Internal Medicine

## 2011-06-08 NOTE — Telephone Encounter (Signed)
Rx refill sent to pharmacy. Patient is due for office visit. Last office visit was 06/2010

## 2011-07-24 LAB — DIFFERENTIAL
Basophils Absolute: 0
Basophils Relative: 0
Eosinophils Absolute: 0.1
Eosinophils Relative: 2
Lymphocytes Relative: 34
Lymphs Abs: 2.8
Monocytes Absolute: 0.5
Monocytes Relative: 6
Neutro Abs: 4.7
Neutrophils Relative %: 57

## 2011-07-24 LAB — CBC
HCT: 40.1
Hemoglobin: 13.4
MCHC: 33.6
MCV: 91
Platelets: 324
RBC: 4.41
RDW: 12.6
WBC: 8.3

## 2011-07-24 LAB — URIC ACID: Uric Acid, Serum: 4

## 2011-08-12 ENCOUNTER — Encounter: Payer: Self-pay | Admitting: Internal Medicine

## 2011-08-12 ENCOUNTER — Ambulatory Visit (INDEPENDENT_AMBULATORY_CARE_PROVIDER_SITE_OTHER): Payer: 59 | Admitting: Internal Medicine

## 2011-08-12 DIAGNOSIS — R030 Elevated blood-pressure reading, without diagnosis of hypertension: Secondary | ICD-10-CM

## 2011-08-12 DIAGNOSIS — I491 Atrial premature depolarization: Secondary | ICD-10-CM | POA: Insufficient documentation

## 2011-08-12 DIAGNOSIS — K589 Irritable bowel syndrome without diarrhea: Secondary | ICD-10-CM | POA: Insufficient documentation

## 2011-08-12 DIAGNOSIS — IMO0001 Reserved for inherently not codable concepts without codable children: Secondary | ICD-10-CM

## 2011-08-12 DIAGNOSIS — K219 Gastro-esophageal reflux disease without esophagitis: Secondary | ICD-10-CM

## 2011-08-12 DIAGNOSIS — G4700 Insomnia, unspecified: Secondary | ICD-10-CM | POA: Insufficient documentation

## 2011-08-12 DIAGNOSIS — E559 Vitamin D deficiency, unspecified: Secondary | ICD-10-CM

## 2011-08-12 NOTE — Progress Notes (Signed)
Subjective:    Patient ID: Nicole Crosby, female    DOB: May 15, 1966, 45 y.o.   MRN: [id number]  HPI New pt here for first visit.  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  Former pt of Dr. Artist Crosby and Dr. Andrey Crosby.  Has also seen Dr. Valora Crosby and Dr. Pearletha Crosby., Dr. Jens Crosby and Dr. Leone Crosby.  PMH of PAC's, IBS, Liver cysts , Vitmain D deficiency, and liver cysts.  Pt has several concerns today.  Her BP has been elevated over last several weeks.  She brings outpt recording and SBP run 140 150 with normal DBP.  She is asymptomatic no chest pain no SOB, no headache no LE edema  She does have strong FH of HTN  She also describes episodes of facial twitching.  She describes it is not really numbness but a full feeling at times.  No visual, speech or muscle weakness type changes.  This has been a long standing problem for her.   CT of head w/o contrast in 2010 showed no acute abnormality.  Symptoms have persisted  She also has chronic neck pain and L hip pain.  She has been evaluated by Dr. Priscille Crosby and Dr. Artist Crosby for this.  She describes at one point she was advised to have an arthrogram but she does not want this  Allergies  Allergen Reactions  . Sulfonamide Derivatives     REACTION: Rash   Past Medical History  Diagnosis Date  . IBS (irritable bowel syndrome)   . Insomnia   . Other abnormal glucose     Tiny hypodensity left hepatic lobe-CT of abdomen and pelvis 07/26/2008-small 8mm periumblical hernia  . PAC (premature atrial contraction)   . Hyperlipidemia   . GERD (gastroesophageal reflux disease)   . Hiatal hernia    Past Surgical History  Procedure Date  . De quervain's release 2000    Right  . Cesarean section 01/2001   History   Social History  . Marital Status: Married    Spouse Name: N/A    Number of Children: N/A  . Years of Education: N/A   Occupational History  . Not on file.   Social History Main Topics  . Smoking status: Never Smoker   . Smokeless tobacco: Never Used  . Alcohol  Use: 0.6 oz/week    1 Glasses of wine per week     occasional  . Drug Use: No  . Sexually Active: Yes    Birth Control/ Protection: Inserts   Other Topics Concern  . 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 Alcohol Use - yes-wine      Full Time Married        Family History  Problem Relation Age of Onset  . Hypertension Mother   . Hyperlipidemia Mother   . Fibrocystic breast disease Mother   . Hearing loss Mother   . Osteopenia Mother   . Prostate cancer Father 46    deceased  . Hypertension Father   . Cancer Father     prostate  . Diabetes Father   . Heart disease Father     MI  . Kidney disease Father   . Other Brother     unknown  . Thyroid disease Sister   . Diabetes Sister     pre-diabetes  . Hypertension Sister   . Arthritis Sister   . Diabetes Sister   . Hypertension Sister   . Hyperlipidemia Sister   .  Fibrocystic breast disease Sister   . Colon cancer      uncle  died in late 57's  . Diabetes Maternal Grandmother   . Cancer Maternal Grandfather     prostate  . COPD Maternal Grandfather   . Learning disabilities Paternal Grandfather    Patient Active Problem List  Diagnoses  . HYPERLIPIDEMIA  . DYSTHYMIA  . UNSPECIFIED SUBJECTIVE VISUAL DISTURBANCE  . GERD  . HIATAL HERNIA  . IRRITABLE BOWEL SYNDROME  . HEPATIC CYST  . HIP PAIN, LEFT  . NECK PAIN  . SCIATICA, LEFT  . MEDIAL EPICONDYLITIS, RIGHT  . BURSITIS, LEFT HIP  . DIZZINESS  . INSOMNIA  . HEADACHE  . PALPITATIONS  . DYSPNEA  . CHEST PAIN, ATYPICAL  . ABDOMINAL BLOATING  . ABDOMINAL PAIN, UNSPECIFIED SITE  . ABDOMINAL PAIN, EPIGASTRIC  . OTHER ABNORMAL GLUCOSE  . ELEVATED BP READING WITHOUT DX HYPERTENSION  . Left leg pain  . IBS (irritable bowel syndrome)  . PAC (premature atrial contraction)  . Insomnia  . Hyperlipidemia  . GERD (gastroesophageal reflux disease)   Current Outpatient Prescriptions on File Prior to  Visit  Medication Sig Dispense Refill  . etonogestrel-ethinyl estradiol (NUVARING) 0.12-0.015 MG/24HR vaginal ring Place 1 each vaginally every 28 (twenty-eight) days. Insert vaginally and leave in place for 3 consecutive weeks, then remove for 1 week.       . Multiple Vitamin (MULTIVITAMIN) tablet Take 1 tablet by mouth daily.        Marland Kitchen NEXIUM 40 MG capsule TAKE 1 CAPSULE BY MOUTH ONCE DAILY  30 capsule  0  . amitriptyline (ELAVIL) 25 MG tablet Take 25 mg by mouth at bedtime as needed.        . Melatonin 3 MG CAPS Take 1 capsule by mouth as needed.             Review of Systems    See HPI Objective:   Physical Exam  Physical Exam  Nursing note and vitals reviewed.  Constitutional: She is oriented to person, place, and time. She appears well-developed and well-nourished.  HENT:  Head: Normocephalic and atraumatic.  Cardiovascular: Normal rate and regular rhythm. Exam reveals no gallop and no friction rub.  No murmur heard.  Pulmonary/Chest: Breath sounds normal. She has no wheezes. She has no rales.  Neurological: She is alert and oriented to person, place, and time.   CN II-XII intact, Motor 5/5 UE and LE  Sensory intact to pp  Cerebellar intact FTN and HTS Skin: Skin is warm and dry.  Psychiatric: She has a normal mood and affect. Her behavior is normal.       Assessment & Plan:  1)  Elevated SBP  Pt is to come to office tommmorrow for a BP check if SBP still elevated will initiate treatment for HTN 2)  Facial twitching.  With normal CT in 2010 and normal exam  Will opt to see of treatment for HTN will help to resolve some of these symptoms 3)  Chronic L hip pain  Will need to review prior imaging 4)  Chronic neck pain  Need to review prior imaging 5) IBS 6)  HH with GERD  I spent 45 minutes with this pt

## 2011-08-13 ENCOUNTER — Telehealth: Payer: Self-pay | Admitting: Emergency Medicine

## 2011-08-13 ENCOUNTER — Encounter: Payer: Self-pay | Admitting: Emergency Medicine

## 2011-08-13 ENCOUNTER — Encounter: Payer: Self-pay | Admitting: Internal Medicine

## 2011-08-13 DIAGNOSIS — Z8679 Personal history of other diseases of the circulatory system: Secondary | ICD-10-CM

## 2011-08-13 LAB — COMPREHENSIVE METABOLIC PANEL
ALT: 15 U/L (ref 0–35)
AST: 17 U/L (ref 0–37)
Albumin: 4.6 g/dL (ref 3.5–5.2)
Alkaline Phosphatase: 55 U/L (ref 39–117)
BUN: 11 mg/dL (ref 6–23)
CO2: 25 mEq/L (ref 19–32)
Calcium: 9.5 mg/dL (ref 8.4–10.5)
Chloride: 103 mEq/L (ref 96–112)
Creat: 0.6 mg/dL (ref 0.50–1.10)
Glucose, Bld: 99 mg/dL (ref 70–99)
Potassium: 4 mEq/L (ref 3.5–5.3)
Sodium: 140 mEq/L (ref 135–145)
Total Bilirubin: 0.4 mg/dL (ref 0.3–1.2)
Total Protein: 7.6 g/dL (ref 6.0–8.3)

## 2011-08-13 LAB — CBC WITH DIFFERENTIAL/PLATELET
Basophils Absolute: 0 10*3/uL (ref 0.0–0.1)
Basophils Relative: 0 % (ref 0–1)
Eosinophils Absolute: 0.1 10*3/uL (ref 0.0–0.7)
Eosinophils Relative: 1 % (ref 0–5)
HCT: 43.9 % (ref 36.0–46.0)
Hemoglobin: 14.3 g/dL (ref 12.0–15.0)
Lymphocytes Relative: 38 % (ref 12–46)
Lymphs Abs: 2.6 10*3/uL (ref 0.7–4.0)
MCH: 30.7 pg (ref 26.0–34.0)
MCHC: 32.6 g/dL (ref 30.0–36.0)
MCV: 94.2 fL (ref 78.0–100.0)
Monocytes Absolute: 0.4 10*3/uL (ref 0.1–1.0)
Monocytes Relative: 6 % (ref 3–12)
Neutro Abs: 3.6 10*3/uL (ref 1.7–7.7)
Neutrophils Relative %: 54 % (ref 43–77)
Platelets: 323 10*3/uL (ref 150–400)
RBC: 4.66 MIL/uL (ref 3.87–5.11)
RDW: 13.3 % (ref 11.5–15.5)
WBC: 6.7 10*3/uL (ref 4.0–10.5)

## 2011-08-13 LAB — VITAMIN D 25 HYDROXY (VIT D DEFICIENCY, FRACTURES): Vit D, 25-Hydroxy: 31 ng/mL (ref 30–89)

## 2011-08-13 LAB — LIPID PANEL
Cholesterol: 191 mg/dL (ref 0–200)
HDL: 56 mg/dL (ref >39)
LDL Cholesterol: 110 mg/dL — ABNORMAL HIGH (ref 0–99)
Total CHOL/HDL Ratio: 3.4 Ratio
Triglycerides: 124 mg/dL (ref <150)
VLDL: 25 mg/dL (ref 0–40)

## 2011-08-13 MED ORDER — ESOMEPRAZOLE MAGNESIUM 40 MG PO CPDR: 40.0000 mg | DELAYED_RELEASE_CAPSULE | Freq: Every day | ORAL | Status: DC

## 2011-08-13 MED ORDER — AMLODIPINE BESYLATE 5 MG PO TABS: 5.0000 mg | ORAL_TABLET | Freq: Every day | ORAL | Status: DC

## 2011-08-13 NOTE — Telephone Encounter (Signed)
LMOVM for pt with DDS recommendations.  Ashea to call for appt and with any questions

## 2011-08-13 NOTE — Telephone Encounter (Signed)
Tell Mishka to take med daily and see me in 3 weeks inoffice

## 2011-08-13 NOTE — Telephone Encounter (Signed)
Nicole Crosby came in for BP recheck.  She c/o HA.  BP 149/92, P-89.  She states you dicussed two BP meds with her yesterday.  She is okay with either, would like the one with the least side effects and least damaging to her liver.  She uses [facility], will check with them before she leaves.

## 2011-08-27 ENCOUNTER — Telehealth: Payer: Self-pay | Admitting: Emergency Medicine

## 2011-08-27 NOTE — Telephone Encounter (Signed)
I spoke with Nicole Crosby this morning.  She states that she plans to stop her BP medication today (amlodipine).  She states that she does not feel any better, in fact she feels worse.  She states that she continues to have daily HAs and her BP has remained in the 134/84 range on amlodipine.  She has CPE scheduled 09/02/11 which she will keep to discuss other medication options

## 2011-09-02 ENCOUNTER — Ambulatory Visit (INDEPENDENT_AMBULATORY_CARE_PROVIDER_SITE_OTHER): Payer: 59 | Admitting: Internal Medicine

## 2011-09-02 DIAGNOSIS — L989 Disorder of the skin and subcutaneous tissue, unspecified: Secondary | ICD-10-CM

## 2011-09-02 DIAGNOSIS — M25559 Pain in unspecified hip: Secondary | ICD-10-CM

## 2011-09-02 DIAGNOSIS — R202 Paresthesia of skin: Secondary | ICD-10-CM

## 2011-09-02 DIAGNOSIS — R51 Headache: Secondary | ICD-10-CM

## 2011-09-02 DIAGNOSIS — R209 Unspecified disturbances of skin sensation: Secondary | ICD-10-CM

## 2011-09-02 DIAGNOSIS — H538 Other visual disturbances: Secondary | ICD-10-CM

## 2011-09-03 ENCOUNTER — Encounter: Payer: Self-pay | Admitting: Internal Medicine

## 2011-09-03 NOTE — Progress Notes (Signed)
Subjective:    Patient ID: Nicole Crosby, female    DOB: [birth year], 45 y.o.   MRN: [id number]  HPI Nicole Crosby presents for comprhensive evaluation.  She stopped her amlodipine because she felt it gave her a headache. She brings a copy of her outpt BP recordings.Since stopping Norvasc she has had 4 readings with SBP ranged 125-132 with normal dbp.  In September SBP range low of 132 to 152 with normal DBP.  She does not wish to take a RX at this time  She continues to have almost daily headaches that start in occipital area and radiates to frontal area.  They now are associated with blurry vision and paresthesias of R side of face and intermittant facial twitching.  They have been off and on for several months now and are quite concerning to her.  She has seen an opthalmologist Dr. Rush Barer recently.  There is no N/V no motor weakness no dysatrthria.   She also has a dark mole on her R leg and would like to see a dermatologist.  She continues to have neck and hip pain but does not want to follow up with Dr. 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 Dorma Russell and told it was a "brain processing problem" but would like a second opinion.  I do not have notes from ENT eval  She is not due for her pap smear until December of this year.  Mammogram in 2/12 was normal.  She also describes low libido.  This bothers her spouse more than it bother the pt.  She has talked to her GYN about this in the past  No recent chest pain, no blood or change in color of stools  Allergies  Allergen Reactions  . Sulfonamide Derivatives     REACTION: Rash   Past Medical History  Diagnosis Date  . IBS (irritable bowel syndrome)   . Insomnia   . Other abnormal glucose     Tiny hypodensity left hepatic lobe-CT of abdomen and pelvis 07/26/2008-small 8mm periumblical hernia  . PAC (premature atrial contraction)   . Hyperlipidemia   . GERD (gastroesophageal reflux  disease)   . Hiatal hernia    Past Surgical History  Procedure Date  . De quervain's release 2000    Right  . Cesarean section 01/2001   History   Social History  . Marital Status: Married    Spouse Name: N/A    Number of Children: N/A  . Years of Education: N/A   Occupational History  . Not on file.   Social History Main Topics  . Smoking status: Never Smoker   . Smokeless tobacco: Never Used  . Alcohol Use: 0.6 oz/week    1 Glasses of wine per week     occasional  . Drug Use: No  . Sexually Active: Yes    Birth Control/ Protection: Inserts   Other Topics Concern  . 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 Alcohol Use - yes-wine      Full Time Married        Family History  Problem Relation Age of Onset  . Hypertension Mother   . Hyperlipidemia Mother   . Fibrocystic breast disease Mother   . Hearing loss Mother   . Osteopenia Mother   . Prostate cancer Father 82    deceased  . Hypertension  Father   . Cancer Father     prostate  . Diabetes Father   . Heart disease Father     MI  . Kidney disease Father   . Other Brother     unknown  . Thyroid disease Sister   . Diabetes Sister     pre-diabetes  . Hypertension Sister   . Arthritis Sister   . Diabetes Sister   . Hypertension Sister   . Hyperlipidemia Sister   . Fibrocystic breast disease Sister   . Colon cancer      uncle  died in late 58's  . Diabetes Maternal Grandmother   . Cancer Maternal Grandfather     prostate  . COPD Maternal Grandfather   . Learning disabilities Paternal Grandfather    Patient Active Problem List  Diagnoses  . HYPERLIPIDEMIA  . DYSTHYMIA  . UNSPECIFIED SUBJECTIVE VISUAL DISTURBANCE  . GERD  . HIATAL HERNIA  . IRRITABLE BOWEL SYNDROME  . HEPATIC CYST  . HIP PAIN, LEFT  . NECK PAIN  . SCIATICA, LEFT  . MEDIAL EPICONDYLITIS, RIGHT  . BURSITIS, LEFT HIP  . DIZZINESS  . INSOMNIA  . HEADACHE  .  PALPITATIONS  . DYSPNEA  . CHEST PAIN, ATYPICAL  . ABDOMINAL BLOATING  . ABDOMINAL PAIN, UNSPECIFIED SITE  . ABDOMINAL PAIN, EPIGASTRIC  . OTHER ABNORMAL GLUCOSE  . ELEVATED BP READING WITHOUT DX HYPERTENSION  . Left leg pain  . IBS (irritable bowel syndrome)  . PAC (premature atrial contraction)  . Insomnia  . Hyperlipidemia  . GERD (gastroesophageal reflux disease)   Current Outpatient Prescriptions on File Prior to Visit  Medication Sig Dispense Refill  . amitriptyline (ELAVIL) 25 MG tablet Take 25 mg by mouth at bedtime as needed.        . clidinium-chlordiazePOXIDE (LIBRAX) 2.5-5 MG per capsule Take 1 capsule by mouth 3 (three) times daily as needed.        Marland Kitchen esomeprazole (NEXIUM) 40 MG capsule Take 1 capsule (40 mg total) by mouth daily before breakfast.  30 capsule  2  . etonogestrel-ethinyl estradiol (NUVARING) 0.12-0.015 MG/24HR vaginal ring Place 1 each vaginally every 28 (twenty-eight) days. Insert vaginally and leave in place for 3 consecutive weeks, then remove for 1 week.       . Melatonin 3 MG CAPS Take 1 capsule by mouth as needed.        . Multiple Vitamin (MULTIVITAMIN) tablet Take 1 tablet by mouth daily.        Marland Kitchen amLODipine (NORVASC) 5 MG tablet Take 1 tablet (5 mg total) by mouth daily.  30 tablet  2       Review of Systems See HPI   Objective:   Physical Exam Physical Exam  Nursing note and vitals reviewed.  Constitutional: She is oriented to person, place, and time. She appears well-developed and well-nourished.  HENT:  Head: Normocephalic and atraumatic.  Right Ear: Tympanic membrane and ear canal normal. No drainage. Tympanic membrane is not injected and not erythematous.  Left Ear: Tympanic membrane and ear canal normal. No drainage. Tympanic membrane is not injected and not erythematous.  Nose: Nose normal. Right sinus exhibits no maxillary sinus tenderness and no frontal sinus tenderness. Left sinus exhibits no maxillary sinus tenderness and no  frontal sinus tenderness.  Mouth/Throat: Oropharynx is clear and moist. No oral lesions. No oropharyngeal exudate.  Eyes: Conjunctivae and EOM are normal. Pupils are equal, round, and reactive to light.  Neck: Normal range of motion. Neck supple.  No JVD present. Carotid bruit is not present. No mass and no thyromegaly present.  Cardiovascular: Normal rate, regular rhythm, S1 normal, S2 normal and intact distal pulses. Exam reveals no gallop and no friction rub.  No murmur heard.  Pulses:  Carotid pulses are 2+ on the right side, and 2+ on the left side.  Dorsalis pedis pulses are 2+ on the right side, and 2+ on the left side.  No carotid bruit. No LE edema  Pulmonary/Chest: Breath sounds normal. She has no wheezes. She has no rales. She exhibits no tenderness.  Abdominal: Soft. Bowel sounds are normal. She exhibits no distension and no mass. There is no hepatosplenomegaly. There is no tenderness. There is no CVA tenderness.  Musculoskeletal: Normal range of motion.  No active synovitis to joints.  Lymphadenopathy:  She has no cervical adenopathy.  She has no axillary adenopathy.  Right: No inguinal and no supraclavicular adenopathy present.  Left: No inguinal and no supraclavicular adenopathy present.  Neurological: She is alert and oriented to person, place, and time. She has normal strength and normal reflexes. She displays no tremor. No cranial nerve deficit or sensory deficit. Coordination and gait normal.  Skin: Skin is warm and dry. No rash noted. No cyanosis. Nails show no clubbing.  She has a brown nevus on posterior surface of R lower leg  Psychiatric: She has a normal mood and affect. Her speech is normal and behavior is normal. Cognition and memory are normal.       Assessment & Plan:  1)  Persistant headaches with paresthesias and visual change:  Will get MRI with contrast 2)  Hip neck pain  Will refer to Dr. Charlett Blake 3)  Skin lesion  Will refer to to [facility] practice  4)   Hearing difficulty R ear.  Will refer to ENT at some point.    See scanned HM sheet.  She has had her flu vaccine already

## 2011-09-03 NOTE — Patient Instructions (Signed)
Mri this week  Will set up referral to Dermatolgy and orhtopedics  Reschedule pap smear with me

## 2011-09-05 ENCOUNTER — Ambulatory Visit (HOSPITAL_BASED_OUTPATIENT_CLINIC_OR_DEPARTMENT_OTHER)
Admission: RE | Admit: 2011-09-05 | Discharge: 2011-09-05 | Disposition: A | Discharge status: Home / Self Care | Payer: 59 | Source: Ambulatory Visit | Attending: Internal Medicine | Admitting: Internal Medicine

## 2011-09-05 DIAGNOSIS — R202 Paresthesia of skin: Secondary | ICD-10-CM

## 2011-09-05 DIAGNOSIS — R209 Unspecified disturbances of skin sensation: Secondary | ICD-10-CM | POA: Insufficient documentation

## 2011-09-05 DIAGNOSIS — H538 Other visual disturbances: Secondary | ICD-10-CM | POA: Insufficient documentation

## 2011-09-05 DIAGNOSIS — R51 Headache: Secondary | ICD-10-CM | POA: Insufficient documentation

## 2011-09-05 MED ORDER — GADOBENATE DIMEGLUMINE 529 MG/ML IV SOLN
12.0000 mL | Freq: Once | INTRAVENOUS | Status: AC | PRN
  Administered 2011-09-05: 12 mL via INTRAVENOUS

## 2011-09-15 ENCOUNTER — Other Ambulatory Visit (HOSPITAL_BASED_OUTPATIENT_CLINIC_OR_DEPARTMENT_OTHER): Payer: 59

## 2011-09-15 ENCOUNTER — Other Ambulatory Visit (HOSPITAL_BASED_OUTPATIENT_CLINIC_OR_DEPARTMENT_OTHER): Payer: Self-pay | Admitting: Orthopedic Surgery

## 2011-09-15 ENCOUNTER — Ambulatory Visit (HOSPITAL_BASED_OUTPATIENT_CLINIC_OR_DEPARTMENT_OTHER)
Admission: RE | Admit: 2011-09-15 | Discharge: 2011-09-15 | Disposition: A | Discharge status: Home / Self Care | Payer: 59 | Source: Ambulatory Visit | Attending: Orthopedic Surgery | Admitting: Orthopedic Surgery

## 2011-09-15 DIAGNOSIS — M25559 Pain in unspecified hip: Secondary | ICD-10-CM | POA: Insufficient documentation

## 2011-09-15 DIAGNOSIS — M1990 Unspecified osteoarthritis, unspecified site: Secondary | ICD-10-CM

## 2011-10-08 ENCOUNTER — Encounter: Payer: Self-pay | Admitting: Internal Medicine

## 2011-10-08 ENCOUNTER — Ambulatory Visit (INDEPENDENT_AMBULATORY_CARE_PROVIDER_SITE_OTHER): Payer: 59 | Admitting: Internal Medicine

## 2011-10-08 VITALS — BP 146/81 | HR 91 | Temp 99.2°F | Resp 12 | Ht 60.75 in | Wt 139.0 lb

## 2011-10-08 DIAGNOSIS — Z1272 Encounter for screening for malignant neoplasm of vagina: Secondary | ICD-10-CM

## 2011-10-08 DIAGNOSIS — Z01419 Encounter for gynecological examination (general) (routine) without abnormal findings: Secondary | ICD-10-CM

## 2011-10-08 DIAGNOSIS — Z113 Encounter for screening for infections with a predominantly sexual mode of transmission: Secondary | ICD-10-CM

## 2011-10-08 MED ORDER — ETONOGESTREL-ETHINYL ESTRADIOL 0.12-0.015 MG/24HR VA RING: VAGINAL_RING | VAGINAL | Status: DC

## 2011-10-08 NOTE — Progress Notes (Signed)
Subjective:    Patient ID: Nicole Crosby, female    DOB: 1966/07/06, 45 y.o.   MRN: [id number]  HPI  Nicole Crosby is here for pelvic exam and pap.  No problem with vaginal dryness.   She uses a Nuvaring for contraception  No vagnal discharge no pain with intercourse, low libido  Allergies  Allergen Reactions  . Sulfonamide Derivatives     REACTION: Rash   Past Medical History  Diagnosis Date  . IBS (irritable bowel syndrome)   . Insomnia   . Other abnormal glucose     Tiny hypodensity left hepatic lobe-CT of abdomen and pelvis 07/26/2008-small 8mm periumblical hernia  . PAC (premature atrial contraction)   . Hyperlipidemia   . GERD (gastroesophageal reflux disease)   . Hiatal hernia    Past Surgical History  Procedure Date  . De quervain's release 2000    Right  . Cesarean section 01/2001   History   Social History  . Marital Status: Married    Spouse Name: N/A    Number of Children: N/A  . Years of Education: N/A   Occupational History  . Not on file.   Social History Main Topics  . Smoking status: Never Smoker   . Smokeless tobacco: Never Used  . Alcohol Use: 0.6 oz/week    1 Glasses of wine per week     occasional  . Drug Use: No  . Sexually Active: Yes    Birth Control/ Protection: Inserts   Other Topics Concern  . 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 Alcohol Use - yes-wine      Full Time Married        Family History  Problem Relation Age of Onset  . Hypertension Mother   . Hyperlipidemia Mother   . Fibrocystic breast disease Mother   . Hearing loss Mother   . Osteopenia Mother   . Prostate cancer Father 88    deceased  . Hypertension Father   . Cancer Father     prostate  . Diabetes Father   . Heart disease Father     MI  . Kidney disease Father   . Other Brother     unknown  . Thyroid disease Sister   . Diabetes Sister     pre-diabetes  . Hypertension Sister   . Arthritis  Sister   . Diabetes Sister   . Hypertension Sister   . Hyperlipidemia Sister   . Fibrocystic breast disease Sister   . Colon cancer      uncle  died in late 80's  . Diabetes Maternal Grandmother   . Cancer Maternal Grandfather     prostate  . COPD Maternal Grandfather   . Learning disabilities Paternal Grandfather    Patient Active Problem List  Diagnoses  . HYPERLIPIDEMIA  . DYSTHYMIA  . UNSPECIFIED SUBJECTIVE VISUAL DISTURBANCE  . GERD  . HIATAL HERNIA  . IRRITABLE BOWEL SYNDROME  . HEPATIC CYST  . HIP PAIN, LEFT  . NECK PAIN  . SCIATICA, LEFT  . MEDIAL EPICONDYLITIS, RIGHT  . BURSITIS, LEFT HIP  . DIZZINESS  . INSOMNIA  . HEADACHE  . PALPITATIONS  . DYSPNEA  . CHEST PAIN, ATYPICAL  . ABDOMINAL BLOATING  . ABDOMINAL PAIN, UNSPECIFIED SITE  . ABDOMINAL PAIN, EPIGASTRIC  . OTHER ABNORMAL GLUCOSE  . ELEVATED BP READING WITHOUT DX HYPERTENSION  . Left leg pain  . IBS (irritable bowel syndrome)  .  PAC (premature atrial contraction)  . Insomnia  . Hyperlipidemia  . GERD (gastroesophageal reflux disease)   Current Outpatient Prescriptions on File Prior to Visit  Medication Sig Dispense Refill  . clidinium-chlordiazePOXIDE (LIBRAX) 2.5-5 MG per capsule Take 1 capsule by mouth 3 (three) times daily as needed.        Marland Kitchen esomeprazole (NEXIUM) 40 MG capsule Take 1 capsule (40 mg total) by mouth daily before breakfast.  30 capsule  2  . Melatonin 3 MG CAPS Take 1 capsule by mouth as needed.        . Multiple Vitamin (MULTIVITAMIN) tablet Take 1 tablet by mouth daily.           Review of Systems    see above  Objective:   Physical Exam  Physical Exam  Nursing note and vitals reviewed.  Constitutional: She is oriented to person, place, and time. She appears well-developed and well-nourished.  HENT:  Head: Normocephalic and atraumatic.  Cardiovascular: Normal rate and regular rhythm. Exam reveals no gallop and no friction rub.  No murmur heard.  Pulmonary/Chest:  Breath sounds normal. She has no wheezes. She has no rales.  Neurological: She is alert and oriented to person, place, and time.  Skin: Skin is warm and dry.  Psychiatric: She has a normal mood and affect. Her behavior is normal.  Pelvic   Nl  External genitalia.  Vagina normal mucosa   Cervix reddened   No adnexal masses or tenderness        Assessment & Plan:  1)  Health maintnance  Pap today  .  Will notify of results.  OK to refill nuvaring

## 2011-10-08 NOTE — Patient Instructions (Signed)
Pap result will be sent to you  Have a good holiday

## 2011-10-21 ENCOUNTER — Ambulatory Visit: Payer: 59 | Attending: Orthopedic Surgery | Admitting: Physical Therapy

## 2011-10-21 DIAGNOSIS — M25669 Stiffness of unspecified knee, not elsewhere classified: Secondary | ICD-10-CM | POA: Insufficient documentation

## 2011-10-21 DIAGNOSIS — M25569 Pain in unspecified knee: Secondary | ICD-10-CM | POA: Insufficient documentation

## 2011-10-21 DIAGNOSIS — IMO0001 Reserved for inherently not codable concepts without codable children: Secondary | ICD-10-CM | POA: Insufficient documentation

## 2011-10-29 ENCOUNTER — Ambulatory Visit: Payer: 59 | Admitting: Physical Therapy

## 2011-11-04 ENCOUNTER — Encounter: Payer: Self-pay | Admitting: Emergency Medicine

## 2011-11-05 ENCOUNTER — Ambulatory Visit: Payer: 59 | Attending: Orthopedic Surgery | Admitting: Physical Therapy

## 2011-11-05 DIAGNOSIS — M25569 Pain in unspecified knee: Secondary | ICD-10-CM | POA: Insufficient documentation

## 2011-11-05 DIAGNOSIS — M25669 Stiffness of unspecified knee, not elsewhere classified: Secondary | ICD-10-CM | POA: Insufficient documentation

## 2011-11-05 DIAGNOSIS — IMO0001 Reserved for inherently not codable concepts without codable children: Secondary | ICD-10-CM | POA: Insufficient documentation

## 2011-11-09 ENCOUNTER — Ambulatory Visit: Payer: 59 | Admitting: Physical Therapy

## 2011-11-09 ENCOUNTER — Other Ambulatory Visit: Payer: Self-pay | Admitting: Internal Medicine

## 2011-11-09 DIAGNOSIS — K219 Gastro-esophageal reflux disease without esophagitis: Secondary | ICD-10-CM

## 2011-11-12 ENCOUNTER — Ambulatory Visit: Payer: 59 | Admitting: Physical Therapy

## 2011-11-16 ENCOUNTER — Encounter: Payer: 59 | Admitting: Physical Therapy

## 2011-11-17 ENCOUNTER — Ambulatory Visit: Payer: 59 | Admitting: Physical Therapy

## 2011-11-30 ENCOUNTER — Other Ambulatory Visit (HOSPITAL_BASED_OUTPATIENT_CLINIC_OR_DEPARTMENT_OTHER): Payer: Self-pay | Admitting: Orthopedic Surgery

## 2011-11-30 DIAGNOSIS — M79605 Pain in left leg: Secondary | ICD-10-CM

## 2011-12-05 ENCOUNTER — Ambulatory Visit (HOSPITAL_BASED_OUTPATIENT_CLINIC_OR_DEPARTMENT_OTHER)
Admission: RE | Admit: 2011-12-05 | Discharge: 2011-12-05 | Disposition: A | Discharge status: Home / Self Care | Payer: 59 | Source: Ambulatory Visit | Attending: Orthopedic Surgery | Admitting: Orthopedic Surgery

## 2011-12-05 DIAGNOSIS — M549 Dorsalgia, unspecified: Secondary | ICD-10-CM

## 2011-12-05 DIAGNOSIS — M545 Low back pain, unspecified: Secondary | ICD-10-CM | POA: Insufficient documentation

## 2011-12-05 DIAGNOSIS — M5137 Other intervertebral disc degeneration, lumbosacral region: Secondary | ICD-10-CM

## 2011-12-05 DIAGNOSIS — M25559 Pain in unspecified hip: Secondary | ICD-10-CM

## 2011-12-05 DIAGNOSIS — M79605 Pain in left leg: Secondary | ICD-10-CM

## 2011-12-05 DIAGNOSIS — M5126 Other intervertebral disc displacement, lumbar region: Secondary | ICD-10-CM

## 2011-12-05 DIAGNOSIS — M51379 Other intervertebral disc degeneration, lumbosacral region without mention of lumbar back pain or lower extremity pain: Secondary | ICD-10-CM

## 2011-12-05 DIAGNOSIS — M519 Unspecified thoracic, thoracolumbar and lumbosacral intervertebral disc disorder: Secondary | ICD-10-CM | POA: Insufficient documentation

## 2011-12-05 DIAGNOSIS — M79609 Pain in unspecified limb: Secondary | ICD-10-CM | POA: Insufficient documentation

## 2011-12-10 ENCOUNTER — Other Ambulatory Visit: Payer: Self-pay | Admitting: Orthopedic Surgery

## 2011-12-10 DIAGNOSIS — M5126 Other intervertebral disc displacement, lumbar region: Secondary | ICD-10-CM

## 2011-12-28 ENCOUNTER — Other Ambulatory Visit: Payer: Self-pay | Admitting: Orthopedic Surgery

## 2011-12-28 ENCOUNTER — Ambulatory Visit
Admission: RE | Admit: 2011-12-28 | Discharge: 2011-12-28 | Disposition: A | Discharge status: Home / Self Care | Payer: 59 | Source: Ambulatory Visit | Attending: Orthopedic Surgery | Admitting: Orthopedic Surgery

## 2011-12-28 DIAGNOSIS — M5126 Other intervertebral disc displacement, lumbar region: Secondary | ICD-10-CM

## 2011-12-28 NOTE — Discharge Instructions (Signed)

## 2012-01-13 ENCOUNTER — Ambulatory Visit (INDEPENDENT_AMBULATORY_CARE_PROVIDER_SITE_OTHER): Payer: 59 | Admitting: Internal Medicine

## 2012-01-13 VITALS — BP 139/84 | HR 95 | Temp 98.9°F | Ht 60.75 in | Wt 137.0 lb

## 2012-01-13 DIAGNOSIS — R1013 Epigastric pain: Secondary | ICD-10-CM

## 2012-01-13 DIAGNOSIS — R3129 Other microscopic hematuria: Secondary | ICD-10-CM

## 2012-01-13 LAB — COMPREHENSIVE METABOLIC PANEL
ALT: 15 U/L (ref 0–35)
AST: 16 U/L (ref 0–37)
Albumin: 4.3 g/dL (ref 3.5–5.2)
Alkaline Phosphatase: 56 U/L (ref 39–117)
BUN: 20 mg/dL (ref 6–23)
CO2: 23 mEq/L (ref 19–32)
Calcium: 9 mg/dL (ref 8.4–10.5)
Chloride: 103 mEq/L (ref 96–112)
Creat: 0.67 mg/dL (ref 0.50–1.10)
Glucose, Bld: 92 mg/dL (ref 70–99)
Potassium: 3.7 mEq/L (ref 3.5–5.3)
Sodium: 138 mEq/L (ref 135–145)
Total Bilirubin: 0.3 mg/dL (ref 0.3–1.2)
Total Protein: 7.2 g/dL (ref 6.0–8.3)

## 2012-01-13 LAB — POCT URINALYSIS DIPSTICK
Bilirubin, UA: NEGATIVE
Glucose, UA: NEGATIVE
Ketones, UA: NEGATIVE
Leukocytes, UA: NEGATIVE
Nitrite, UA: NEGATIVE
Protein, UA: NEGATIVE
Spec Grav, UA: <=1.005
Urobilinogen, UA: NEGATIVE
pH, UA: 6

## 2012-01-13 LAB — TSH: TSH: 0.617 u[IU]/mL (ref 0.350–4.500)

## 2012-01-13 LAB — AMYLASE: Amylase: 61 U/L (ref 0–105)

## 2012-01-13 LAB — LIPASE: Lipase: 41 U/L (ref 0–75)

## 2012-01-13 MED ORDER — ALUMINUM CHLORIDE 20 % EX SOLN: Freq: Every day | CUTANEOUS | Status: DC

## 2012-01-13 NOTE — Progress Notes (Signed)
Subjective:    Patient ID: Nicole Crosby, female    DOB: January 03, 1966, 46 y.o.   MRN: [id number]  HPI [name] reports 2 weeks of upper abd/epigastric  pain.  Sometimes burning  Has bloating.  Worse after eating.   No N/V/D no fever.  No lower pelvic pain.  Similar episode prior and resolved on its own.  She reports Dr. Leone Payor has wanted to do an endoscopy but she has not wanted this.  No dysuria no vaginal discharge.  She is taking Nexium daily.   She reports just ending her menses this month  U/S 7/11 shows no gallstones  She also reports decreased hearing.  She has been evaluatied in past by 2 ENT's and they have stated it may be a brain processing problem.   Also has excessive sweating under her arms  Allergies  Allergen Reactions  . Sulfonamide Derivatives Rash     Rash   Past Medical History  Diagnosis Date  . IBS (irritable bowel syndrome)   . Insomnia   . Other abnormal glucose     Tiny hypodensity left hepatic lobe-CT of abdomen and pelvis 07/26/2008-small 8mm periumblical hernia  . PAC (premature atrial contraction)   . Hyperlipidemia   . GERD (gastroesophageal reflux disease)   . Hiatal hernia    Past Surgical History  Procedure Date  . De quervain's release 2000    Right  . Cesarean section 01/2001   History   Social History  . Marital Status: Married    Spouse Name: N/A    Number of Children: N/A  . Years of Education: N/A   Occupational History  . Not on file.   Social History Main Topics  . Smoking status: Never Smoker   . Smokeless tobacco: Never Used  . Alcohol Use: 0.6 oz/week    1 Glasses of wine per week     occasional  . Drug Use: No  . Sexually Active: Yes    Birth Control/ Protection: Inserts   Other Topics Concern  . 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 Alcohol Use - yes-wine      Full Time Married        Family History  Problem Relation Age of Onset  . Hypertension  Mother   . Hyperlipidemia Mother   . Fibrocystic breast disease Mother   . Hearing loss Mother   . Osteopenia Mother   . Prostate cancer Father 52    deceased  . Hypertension Father   . Cancer Father     prostate  . Diabetes Father   . Heart disease Father     MI  . Kidney disease Father   . Other Brother     unknown  . Thyroid disease Sister   . Diabetes Sister     pre-diabetes  . Hypertension Sister   . Arthritis Sister   . Diabetes Sister   . Hypertension Sister   . Hyperlipidemia Sister   . Fibrocystic breast disease Sister   . Colon cancer      uncle  died in late 54's  . Diabetes Maternal Grandmother   . Cancer Maternal Grandfather     prostate  . COPD Maternal Grandfather   . Learning disabilities Paternal Grandfather    Patient Active Problem List  Diagnoses  . HYPERLIPIDEMIA  . DYSTHYMIA  . UNSPECIFIED SUBJECTIVE VISUAL DISTURBANCE  . GERD  . HIATAL HERNIA  . IRRITABLE BOWEL SYNDROME  .  HEPATIC CYST  . HIP PAIN, LEFT  . NECK PAIN  . SCIATICA, LEFT  . MEDIAL EPICONDYLITIS, RIGHT  . BURSITIS, LEFT HIP  . DIZZINESS  . INSOMNIA  . HEADACHE  . PALPITATIONS  . DYSPNEA  . CHEST PAIN, ATYPICAL  . ABDOMINAL BLOATING  . ABDOMINAL PAIN, UNSPECIFIED SITE  . ABDOMINAL PAIN, EPIGASTRIC  . OTHER ABNORMAL GLUCOSE  . ELEVATED BP READING WITHOUT DX HYPERTENSION  . Left leg pain  . IBS (irritable bowel syndrome)  . PAC (premature atrial contraction)  . Insomnia  . Hyperlipidemia  . GERD (gastroesophageal reflux disease)   Current Outpatient Prescriptions on File Prior to Visit  Medication Sig Dispense Refill  . clidinium-chlordiazePOXIDE (LIBRAX) 2.5-5 MG per capsule Take 1 capsule by mouth 3 (three) times daily as needed.        . etonogestrel-ethinyl estradiol (NUVARING) 0.12-0.015 MG/24HR vaginal ring Insert vaginally and leave in place for 3 consecutive weeks, then remove for 1 week.  1 each  5  . Multiple Vitamin (MULTIVITAMIN) tablet Take 1 tablet by  mouth daily.        Marland Kitchen NEXIUM 40 MG capsule TAKE 1 CAPSULE (40 MG TOTAL) BY MOUTH DAILY BEFORE BREAKFAST.  30 capsule  2  . Calcium Carb-Cholecalciferol (CALCIUM PLUS VITAMIN D3) 600-500 MG-UNIT CAPS Take 1 tablet by mouth daily.        . Melatonin 3 MG CAPS Take 1 capsule by mouth as needed.             Review of Systems See HPI    Objective:   Physical Exam Physical Exam  Nursing note and vitals reviewed.  Constitutional: She is oriented to person, place, and time. She appears well-developed and well-nourished.  HENT:  Head: Normocephalic and atraumatic.  Cardiovascular: Normal rate and regular rhythm. Exam reveals no gallop and no friction rub.  No murmur heard.  Pulmonary/Chest: Breath sounds normal. She has no wheezes. She has no rales. ABD.  Non distended.  Minimal tenderness epigastrium upper abd.  No peritoneal signs  No rebound.  No HSM  Neurological: She is alert and oriented to person, place, and time.  Skin: Skin is warm and dry.  Psychiatric: She has a normal mood and affect. Her behavior is normal.          Assessment & Plan:  1)   Epigastric pain/dyspepsia of uncertain etiology.  Will check chemistries, amylase, lipase, and U/a is neg today.  If unrevealing I agree with GI MD that she need endoscopic evaluation 2)   REduced hearing  She has seen ENT in past.  She will call to set up appt 3)  Axillary hydrosis OK for Drysol nightly.  Check TSH  She is to call in am  For lab resulst she vocies understanding

## 2012-01-13 NOTE — Patient Instructions (Signed)
Call office in am for results  OK to set up appt with Dr. Gladys Damme

## 2012-01-14 ENCOUNTER — Telehealth: Payer: Self-pay | Admitting: Internal Medicine

## 2012-01-14 LAB — CBC WITH DIFFERENTIAL/PLATELET
Basophils Absolute: 0 10*3/uL (ref 0.0–0.1)
Basophils Relative: 0 % (ref 0–1)
Eosinophils Absolute: 0 10*3/uL (ref 0.0–0.7)
Eosinophils Relative: 0 % (ref 0–5)
HCT: 45.5 % (ref 36.0–46.0)
Hemoglobin: 14.6 g/dL (ref 12.0–15.0)
Lymphocytes Relative: 32 % (ref 12–46)
Lymphs Abs: 3.5 10*3/uL (ref 0.7–4.0)
MCH: 30.8 pg (ref 26.0–34.0)
MCHC: 32.1 g/dL (ref 30.0–36.0)
MCV: 96 fL (ref 78.0–100.0)
Monocytes Absolute: 0.7 10*3/uL (ref 0.1–1.0)
Monocytes Relative: 7 % (ref 3–12)
Neutro Abs: 6.7 10*3/uL (ref 1.7–7.7)
Neutrophils Relative %: 61 % (ref 43–77)
Platelets: 285 10*3/uL (ref 150–400)
RBC: 4.74 MIL/uL (ref 3.87–5.11)
RDW: 13.1 % (ref 11.5–15.5)
WBC: 11 10*3/uL — ABNORMAL HIGH (ref 4.0–10.5)

## 2012-01-14 NOTE — Telephone Encounter (Signed)
Spoke with pt and informed of minimally elevated WBC.  Amylase, lipase normal.  She reports she felt better last night and today.  Eating smaller portions.  Advised to avoid dairy for now and call office Monday.  If any worsening will get CT.  She reports she was on end of her menses

## 2012-01-18 ENCOUNTER — Telehealth: Payer: Self-pay | Admitting: Emergency Medicine

## 2012-01-18 NOTE — Telephone Encounter (Signed)
Nicole Crosby left a message Friday requesting a copy of her labs.  Spoke with her this morning, aware I will print and take them down to her.  She is agreeable

## 2012-01-19 ENCOUNTER — Telehealth: Payer: Self-pay | Admitting: Internal Medicine

## 2012-01-19 NOTE — Telephone Encounter (Signed)
Spoke with Fleet Contras.  She states that she woke up in the middle of the night Saturday and Sunday night with abd pain and distension.  But, yesterday and today, she has been "okay".  She has not has as much pain, but pain has not completely resolved either.  She states that she has modified her diet to a low carb, smaller portion diet which seems to have helped.  She says she was really trying to wait through this week to see if changing her diet would help.  Aware I will send message to DDS for her recommendations.

## 2012-01-19 NOTE — Telephone Encounter (Signed)
Nicole Crosby    Please call [name] and see how she is feeling with her abd pain and document   thanks

## 2012-01-20 ENCOUNTER — Telehealth: Payer: Self-pay | Admitting: Internal Medicine

## 2012-01-20 NOTE — Telephone Encounter (Signed)
Spoke with pt.  She is overall improving but still has bothersome epigastric pain at night over prior week-end.  Last two days has felt better with diet change.   No FH of MI or early heart disease.    Advised pt to see me tomorrow or early next week and we will get EKG and discuss if further imaging needed.  She wishes to avoid CT right now.  I did advise again that I feel endoscopy is needed and she should see her GI Dr. Leone Payor.  She voices understanding and states if dietary changes are not working, she will make appt with her GI

## 2012-01-27 ENCOUNTER — Ambulatory Visit: Payer: 59 | Admitting: Internal Medicine

## 2012-01-27 ENCOUNTER — Other Ambulatory Visit: Payer: Self-pay | Admitting: Internal Medicine

## 2012-01-27 ENCOUNTER — Ambulatory Visit (HOSPITAL_BASED_OUTPATIENT_CLINIC_OR_DEPARTMENT_OTHER)
Admission: RE | Admit: 2012-01-27 | Discharge: 2012-01-27 | Disposition: A | Discharge status: Home / Self Care | Payer: 59 | Source: Ambulatory Visit | Attending: Internal Medicine | Admitting: Internal Medicine

## 2012-01-27 DIAGNOSIS — Z1231 Encounter for screening mammogram for malignant neoplasm of breast: Secondary | ICD-10-CM

## 2012-02-03 ENCOUNTER — Ambulatory Visit (INDEPENDENT_AMBULATORY_CARE_PROVIDER_SITE_OTHER): Payer: 59 | Admitting: Internal Medicine

## 2012-02-03 ENCOUNTER — Encounter: Payer: Self-pay | Admitting: Internal Medicine

## 2012-02-03 VITALS — BP 144/82 | HR 86 | Temp 99.4°F | Ht 60.75 in | Wt 136.1 lb

## 2012-02-03 DIAGNOSIS — R0789 Other chest pain: Secondary | ICD-10-CM

## 2012-02-03 DIAGNOSIS — R109 Unspecified abdominal pain: Secondary | ICD-10-CM

## 2012-02-03 DIAGNOSIS — R002 Palpitations: Secondary | ICD-10-CM

## 2012-02-03 DIAGNOSIS — R030 Elevated blood-pressure reading, without diagnosis of hypertension: Secondary | ICD-10-CM

## 2012-02-03 DIAGNOSIS — R1013 Epigastric pain: Secondary | ICD-10-CM

## 2012-02-03 DIAGNOSIS — I491 Atrial premature depolarization: Secondary | ICD-10-CM

## 2012-02-03 MED ORDER — CILIDINIUM-CHLORDIAZEPOXIDE 2.5-5 MG PO CAPS: 1.0000 | ORAL_CAPSULE | Freq: Three times a day (TID) | ORAL | Status: DC | PRN

## 2012-02-03 NOTE — Patient Instructions (Signed)
To have Xray  Will make appt with cardiologist  Follow up with Dr. Leone Payor you GI MD

## 2012-02-03 NOTE — Progress Notes (Signed)
Subjective:    Patient ID: Nicole Crosby, female    DOB: 03/28/1966, 46 y.o.   MRN: [id number]  HPI Levern is here for follow up .  She continues to have upper epigastric and over the last week mid sternal chest pain intermittantly.  No SOB, no diaphoresis, no radiation to L arm.  She does have history of PAC's and has had evaluation by Dr. Jens Som which was essentially neg except rare PAC.    She is on Nexium now and her GI MD Dr. Leone Payor did recommend upper endoscopy.    Allergies  Allergen Reactions  . Sulfonamide Derivatives Rash     Rash   Past Medical History  Diagnosis Date  . IBS (irritable bowel syndrome)   . Insomnia   . Other abnormal glucose     Tiny hypodensity left hepatic lobe-CT of abdomen and pelvis 07/26/2008-small 8mm periumblical hernia  . PAC (premature atrial contraction)   . Hyperlipidemia   . GERD (gastroesophageal reflux disease)   . Hiatal hernia    Past Surgical History  Procedure Date  . De quervain's release 2000    Right  . Cesarean section 01/2001   History   Social History  . Marital Status: Married    Spouse Name: N/A    Number of Children: N/A  . Years of Education: N/A   Occupational History  . Not on file.   Social History Main Topics  . Smoking status: Never Smoker   . Smokeless tobacco: Never Used  . Alcohol Use: 0.6 oz/week    1 Glasses of wine per week     occasional  . Drug Use: No  . Sexually Active: Yes    Birth Control/ Protection: Inserts   Other Topics Concern  . 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 Alcohol Use - yes-wine      Full Time Married        Family History  Problem Relation Age of Onset  . Hypertension Mother   . Hyperlipidemia Mother   . Fibrocystic breast disease Mother   . Hearing loss Mother   . Osteopenia Mother   . Prostate cancer Father 47    deceased  . Hypertension Father   . Cancer Father     prostate  . Diabetes  Father   . Heart disease Father     MI  . Kidney disease Father   . Other Brother     unknown  . Thyroid disease Sister   . Diabetes Sister     pre-diabetes  . Hypertension Sister   . Arthritis Sister   . Diabetes Sister   . Hypertension Sister   . Hyperlipidemia Sister   . Fibrocystic breast disease Sister   . Colon cancer      uncle  died in late 43's  . Diabetes Maternal Grandmother   . Cancer Maternal Grandfather     prostate  . COPD Maternal Grandfather   . Learning disabilities Paternal Grandfather    Patient Active Problem List  Diagnoses  . HYPERLIPIDEMIA  . DYSTHYMIA  . UNSPECIFIED SUBJECTIVE VISUAL DISTURBANCE  . GERD  . HIATAL HERNIA  . IRRITABLE BOWEL SYNDROME  . HEPATIC CYST  . HIP PAIN, LEFT  . NECK PAIN  . SCIATICA, LEFT  . MEDIAL EPICONDYLITIS, RIGHT  . BURSITIS, LEFT HIP  . DIZZINESS  . INSOMNIA  . HEADACHE  . PALPITATIONS  . DYSPNEA  . CHEST  PAIN, ATYPICAL  . ABDOMINAL BLOATING  . ABDOMINAL PAIN, UNSPECIFIED SITE  . ABDOMINAL PAIN, EPIGASTRIC  . OTHER ABNORMAL GLUCOSE  . ELEVATED BP READING WITHOUT DX HYPERTENSION  . Left leg pain  . IBS (irritable bowel syndrome)  . PAC (premature atrial contraction)  . Insomnia  . Hyperlipidemia  . GERD (gastroesophageal reflux disease)   Current Outpatient Prescriptions on File Prior to Visit  Medication Sig Dispense Refill  . aluminum chloride (DRYSOL) 20 % external solution Apply topically at bedtime.  35 mL  2  . Calcium Carb-Cholecalciferol (CALCIUM PLUS VITAMIN D3) 600-500 MG-UNIT CAPS Take 1 tablet by mouth daily.        Marland Kitchen etonogestrel-ethinyl estradiol (NUVARING) 0.12-0.015 MG/24HR vaginal ring Insert vaginally and leave in place for 3 consecutive weeks, then remove for 1 week.  1 each  5  . Melatonin 3 MG CAPS Take 1 capsule by mouth as needed.        . Multiple Vitamin (MULTIVITAMIN) tablet Take 1 tablet by mouth daily.        Marland Kitchen NEXIUM 40 MG capsule TAKE 1 CAPSULE (40 MG TOTAL) BY MOUTH DAILY  BEFORE BREAKFAST.  30 capsule  2  . DISCONTD: clidinium-chlordiazePOXIDE (LIBRAX) 2.5-5 MG per capsule Take 1 capsule by mouth 3 (three) times daily as needed.             Review of Systems    see HPI Objective:   Physical Exam  Physical Exam  Nursing note and vitals reviewed.  Constitutional: She is oriented to person, place, and time. She appears well-developed and well-nourished.  HENT:  Head: Normocephalic and atraumatic.  Cardiovascular: Normal rate and regular rhythm. Exam reveals no gallop and no friction rub.  No murmur heard.  Pulmonary/Chest: Breath sounds normal. She has no wheezes. She has no rales.  Neurological: She is alert and oriented to person, place, and time.  Skin: Skin is warm and dry.  Psychiatric: She has a normal mood and affect. Her behavior is normal.        Assessment & Plan:  1)  Atypical chest pain  EKG today shows shortened PR interval.  I do not see a delta wave.  Will set up appt with Dr. Jens Som for opinion regarding possible beta blocker.  She tends to be tachycardic and BP is borderline.   2)  Elevated BP  See above .  She may benefit from beta blocker if OK with cardiology 3) persistant epigastric pain  Will set up abd ct with contrast and I counseled pt she is to follow up with GI MD Dr. Leone Payor

## 2012-02-09 ENCOUNTER — Inpatient Hospital Stay (HOSPITAL_BASED_OUTPATIENT_CLINIC_OR_DEPARTMENT_OTHER): Admission: RE | Admit: 2012-02-09 | Payer: 59 | Source: Ambulatory Visit

## 2012-02-17 ENCOUNTER — Encounter: Payer: Self-pay | Admitting: Cardiology

## 2012-02-17 ENCOUNTER — Ambulatory Visit (INDEPENDENT_AMBULATORY_CARE_PROVIDER_SITE_OTHER): Payer: 59 | Admitting: Cardiology

## 2012-02-17 VITALS — BP 140/100 | HR 100 | Ht 61.0 in | Wt 136.1 lb

## 2012-02-17 DIAGNOSIS — R0789 Other chest pain: Secondary | ICD-10-CM

## 2012-02-17 DIAGNOSIS — R030 Elevated blood-pressure reading, without diagnosis of hypertension: Secondary | ICD-10-CM

## 2012-02-17 DIAGNOSIS — R002 Palpitations: Secondary | ICD-10-CM

## 2012-02-17 MED ORDER — METOPROLOL SUCCINATE ER 25 MG PO TB24: 25.0000 mg | ORAL_TABLET | Freq: Every day | ORAL | Status: DC

## 2012-02-17 NOTE — Assessment & Plan Note (Signed)
Palpitations continue. Blood pressure mildly elevated. Will add Toprol 25 mg daily for both.

## 2012-02-17 NOTE — Progress Notes (Signed)
HPI: Pleasant female I have previously seen for palpitations and chest pain. Previous workup in 2005 apparently normal per patient. She had an echocardiogram in August of 2010 that revealed normal LV function. No valvular abnormalities were noted. Note previous TSH was normal. Stress echocardiogram which was performed on October 10, 2009 and was normal. A CardioNet was also performed and showed sinus to sinus tachycardia with rare PAC. I last saw her in April 2012 and she again complained of chest pain. Stress echo May 2012 normal. DDimer elevated but chest CT showed no pulmonary embolus. Labs in March of 2013 showed normal LFTs, Hgb, K and TSH. Since I last saw her, he has dyspnea with more extreme activities but not routine activities. No orthopnea, PND, pedal edema or syncope. She has had issues with left hip and right elbow pain. She has also had some abdominal discomfort. Several weeks ago she had chest pain. It lasted for approximately 45 minutes and resolve spontaneously. Not pleuritic or positional. Note she does have some discomfort with swallowing food. She has had no pain since. She continues to have palpitations described as a skip.    Current Outpatient Prescriptions  Medication Sig Dispense Refill  . aluminum chloride (DRYSOL) 20 % external solution Apply topically at bedtime.  35 mL  2  . Calcium Carb-Cholecalciferol (CALCIUM PLUS VITAMIN D3) 600-500 MG-UNIT CAPS Take 1 tablet by mouth daily.        . clidinium-chlordiazePOXIDE (LIBRAX) 2.5-5 MG per capsule Take 1 capsule by mouth 3 (three) times daily as needed.  60 capsule  0  . etonogestrel-ethinyl estradiol (NUVARING) 0.12-0.015 MG/24HR vaginal ring Insert vaginally and leave in place for 3 consecutive weeks, then remove for 1 week.  1 each  5  . Melatonin 3 MG CAPS Take 1 capsule by mouth as needed.        . Multiple Vitamin (MULTIVITAMIN) tablet Take 1 tablet by mouth daily.        Marland Kitchen NEXIUM 40 MG capsule TAKE 1 CAPSULE (40 MG  TOTAL) BY MOUTH DAILY BEFORE BREAKFAST.  30 capsule  2     Past Medical History  Diagnosis Date  . IBS (irritable bowel syndrome)   . Insomnia   . Other abnormal glucose     Tiny hypodensity left hepatic lobe-CT of abdomen and pelvis 07/26/2008-small 8mm periumblical hernia  . PAC (premature atrial contraction)   . Hyperlipidemia   . GERD (gastroesophageal reflux disease)   . Hiatal hernia     Past Surgical History  Procedure Date  . De quervain's release 2000    Right  . Cesarean section 01/2001    History   Social History  . Marital Status: Married    Spouse Name: N/A    Number of Children: N/A  . Years of Education: N/A   Occupational History  . Not on file.   Social History Main Topics  . Smoking status: Never Smoker   . Smokeless tobacco: Never Used  . Alcohol Use: 0.6 oz/week    1 Glasses of wine per week     occasional  . Drug Use: No  . Sexually Active: Yes    Birth Control/ Protection: Inserts   Other Topics Concern  . 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 Alcohol Use - yes-wine      Full Time Married         ROS: Left hip and right elbow pain  as well as recent abdominal discomfort and nausea but no fevers or chills, productive cough, hemoptysis, dysphasia, odynophagia, melena, hematochezia, dysuria, hematuria, rash, seizure activity, orthopnea, PND, pedal edema, claudication. Remaining systems are negative.  Physical Exam: Well-developed well-nourished in no acute distress.  Skin is warm and dry.  HEENT is normal.  Neck is supple. No thyromegaly.  Chest is clear to auscultation with normal expansion.  Cardiovascular exam is regular rate and rhythm.  Abdominal exam nontender or distended. No masses palpated. Extremities show no edema. neuro grossly intact  ECG 02/03/2012-sinus rhythm at a rate of 96. Axis normal. No significant ST changes.

## 2012-02-17 NOTE — Patient Instructions (Signed)
Your physician recommends that you schedule a follow-up appointment in: 8 WEEKS  START METOPROLOL SUCC 25 MG ONCE DAILY

## 2012-02-17 NOTE — Assessment & Plan Note (Signed)
Symptoms atypical. Recent electrocardiogram normal. There is some discomfort with swallowing. This may be GI related and she is scheduled to see gastroenterology. Previous functional study negative. No further evaluation from a cardiac standpoint at this time.

## 2012-02-17 NOTE — Assessment & Plan Note (Signed)
Add Toprol 25 mg daily. 

## 2012-02-18 ENCOUNTER — Telehealth: Payer: Self-pay | Admitting: *Deleted

## 2012-02-18 ENCOUNTER — Ambulatory Visit (HOSPITAL_BASED_OUTPATIENT_CLINIC_OR_DEPARTMENT_OTHER)
Admission: RE | Admit: 2012-02-18 | Discharge: 2012-02-18 | Disposition: A | Discharge status: Home / Self Care | Payer: 59 | Source: Ambulatory Visit | Attending: Internal Medicine | Admitting: Internal Medicine

## 2012-02-18 ENCOUNTER — Other Ambulatory Visit (HOSPITAL_BASED_OUTPATIENT_CLINIC_OR_DEPARTMENT_OTHER): Payer: 59

## 2012-02-18 DIAGNOSIS — R1013 Epigastric pain: Secondary | ICD-10-CM

## 2012-02-18 DIAGNOSIS — R109 Unspecified abdominal pain: Secondary | ICD-10-CM | POA: Insufficient documentation

## 2012-02-18 DIAGNOSIS — E278 Other specified disorders of adrenal gland: Secondary | ICD-10-CM

## 2012-02-18 MED ORDER — IOHEXOL 300 MG/ML  SOLN: 100.0000 mL | Freq: Once | INTRAMUSCULAR | Status: AC | PRN

## 2012-02-18 NOTE — Telephone Encounter (Signed)
Pt notified that her CT was normal with the exception of a small umbilical hernia.  Pt was waiting for the results of her CT before she made her f/u with Dr. Leone Payor for her endoscopy.  She stated that she would go ahead and schedule that now.

## 2012-02-22 ENCOUNTER — Encounter: Payer: Self-pay | Admitting: Emergency Medicine

## 2012-02-22 ENCOUNTER — Other Ambulatory Visit: Payer: Self-pay | Admitting: Internal Medicine

## 2012-02-22 ENCOUNTER — Emergency Department
Admission: EM | Admit: 2012-02-22 | Discharge: 2012-02-22 | Disposition: A | Discharge status: Home / Self Care | Payer: 59 | Source: Home / Self Care | Attending: Emergency Medicine | Admitting: Emergency Medicine

## 2012-02-22 DIAGNOSIS — M7750 Other enthesopathy of unspecified foot: Secondary | ICD-10-CM

## 2012-02-22 DIAGNOSIS — M79609 Pain in unspecified limb: Secondary | ICD-10-CM

## 2012-02-22 DIAGNOSIS — M25579 Pain in unspecified ankle and joints of unspecified foot: Secondary | ICD-10-CM

## 2012-02-22 DIAGNOSIS — M7740 Metatarsalgia, unspecified foot: Secondary | ICD-10-CM

## 2012-02-22 NOTE — ED Notes (Signed)
Foot pain x 1 week over 4th metatarsal

## 2012-02-22 NOTE — ED Provider Notes (Signed)
History     CSN: [id number]  Arrival date & time 02/22/12  1240   First MD Initiated Contact with Patient 02/22/12 1250      Chief Complaint  Patient presents with  . Foot Pain    (Consider location/radiation/quality/duration/timing/severity/associated sxs/prior treatment) HPI This patient has had right foot pain for the last week.  She does not recall any trauma or injuries or twisting or falling or dropping anything on that foot.  It's located on her fourth metatarsal.  She works at [employer] [place] [employer] and did take an oblique image in which she did not see anything wrong with the bone.  No new exercise regimens.  She states the pain is sharp and located only on the fourth metatarsal.  She's been continuing to work which is likely aggravating it.  The pain is constant and she has not been using any medications or modalities yet.  Past Medical History  Diagnosis Date  . IBS (irritable bowel syndrome)   . Insomnia   . Other abnormal glucose     Tiny hypodensity left hepatic lobe-CT of abdomen and pelvis 07/26/2008-small 8mm periumblical hernia  . PAC (premature atrial contraction)   . Hyperlipidemia   . GERD (gastroesophageal reflux disease)   . Hiatal hernia     Past Surgical History  Procedure Date  . De quervain's release 2000    Right  . Cesarean section 01/2001    Family History  Problem Relation Age of Onset  . Hypertension Mother   . Hyperlipidemia Mother   . Fibrocystic breast disease Mother   . Hearing loss Mother   . Osteopenia Mother   . Prostate cancer Father 62    deceased  . Hypertension Father   . Cancer Father     prostate  . Diabetes Father   . Heart disease Father     MI  . Kidney disease Father   . Other Brother     unknown  . Thyroid disease Sister   . Diabetes Sister     pre-diabetes  . Hypertension Sister   . Arthritis Sister   . Diabetes Sister   . Hypertension Sister   . Hyperlipidemia Sister   . Fibrocystic breast  disease Sister   . Colon cancer      uncle  died in late 54's  . Diabetes Maternal Grandmother   . Cancer Maternal Grandfather     prostate  . COPD Maternal Grandfather   . Learning disabilities Paternal Grandfather     History  Substance Use Topics  . Smoking status: Never Smoker   . Smokeless tobacco: Never Used  . Alcohol Use: 0.6 oz/week    1 Glasses of wine per week     occasional    OB History    Grav Para Term Preterm Abortions TAB SAB Ect Mult Living   2 2              Review of Systems  All other systems reviewed and are negative.    Allergies  Sulfonamide derivatives  Home Medications   Current Outpatient Rx  Name Route Sig Dispense Refill  . ALUMINUM CHLORIDE 20 % EX SOLN Topical Apply topically at bedtime. 35 mL 2  . CALCIUM CARB-CHOLECALCIFEROL 600-500 MG-UNIT PO CAPS Oral Take 1 tablet by mouth daily.      Marland Kitchen CLINDINIUM-CHLORDIAZEPOXIDE 2.5-5 MG PO CAPS Oral Take 1 capsule by mouth 3 (three) times daily as needed. 60 capsule 0  . ETONOGESTREL-ETHINYL ESTRADIOL 0.12-0.015  MG/24HR VA RING  Insert vaginally and leave in place for 3 consecutive weeks, then remove for 1 week. 1 each 5  . MELATONIN 3 MG PO CAPS Oral Take 1 capsule by mouth as needed.      Marland Kitchen METOPROLOL SUCCINATE ER 25 MG PO TB24 Oral Take 1 tablet (25 mg total) by mouth daily. 30 tablet 11  . ONE-DAILY MULTI VITAMINS PO TABS Oral Take 1 tablet by mouth daily.      Marland Kitchen NEXIUM 40 MG PO CPDR  TAKE 1 CAPSULE (40 MG TOTAL) BY MOUTH DAILY BEFORE BREAKFAST. 30 capsule 3    BP 163/96  Pulse 92  Temp(Src) 99.1 F (37.3 C) (Oral)  Resp 16  Ht 5\' 1"  (1.549 m)  Wt 136 lb (61.689 kg)  BMI 25.70 kg/m2  SpO2 100%  LMP 02/03/2012  Physical Exam  Nursing note and vitals reviewed. Constitutional: She is oriented to person, place, and time. She appears well-developed and well-nourished.  HENT:  Head: Normocephalic and atraumatic.  Eyes: No scleral icterus.  Neck: Neck supple.  Cardiovascular: Regular  rhythm and normal heart sounds.   Pulmonary/Chest: Effort normal and breath sounds normal. No respiratory distress.  Musculoskeletal:       R ankle/foot: FROM, +TTP distal fourth metatarsal only.   No TTP medial/lateral malleolus, navicular, base of 5th, calcaneus, Achilles, proximal fibula.  No swelling.  No ecchymoses.  Distal neurovascular status is intact.  Axial loading does not cause pain.  Resisted flexion does cause some irritation.  Neurological: She is alert and oriented to person, place, and time.  Skin: Skin is warm and dry.  Psychiatric: She has a normal mood and affect. Her speech is normal.    ED Course  Procedures (including critical care time)  Labs Reviewed - No data to display No results found.   1. Pain, joint, ankle and foot   2. Metatarsalgia       MDM   This is likely a generic metatarsalgia and I do not believe that this is a bony pathology.  I've advised her to use ice and ibuprofen.  I also gave her a postop shoe which aren't like her to wear for the next week.  We held off on x-ray today because she is oriented oblique.  I feel that if she is still in pain in the next few days then she will take an x-ray.  If the pain continues past day or, then she will followup with her orthopedist, Dr. Charlett Blake, who can further evaluate the pain.  She states that she hurt he has an appointment in one week so this would be a good time to followup with the foot pain.  I suspect that forcing her to rest with the postop shoe should help the pain dramatically along with the ice.  The patient understands and agrees to this course of action.    Marlaine Hind, MD 02/22/12 1309

## 2012-02-24 ENCOUNTER — Telehealth: Payer: Self-pay | Admitting: *Deleted

## 2012-02-24 NOTE — Telephone Encounter (Signed)
Pt called asking who the ENT was that she and Dr Constance Goltz talked about in getting her hearing check.  It was Dr Crouse([facility])

## 2012-03-01 ENCOUNTER — Telehealth: Payer: Self-pay | Admitting: Cardiology

## 2012-03-01 NOTE — Telephone Encounter (Signed)
New msg Pt had some questions about metoprolol.Please call

## 2012-03-01 NOTE — Telephone Encounter (Signed)
Spoke with pt, she wanted to know if toprol could cause low urine output. Pt made aware not listed as a side effect in PDR. Pt will follow up with primary md.

## 2012-03-02 ENCOUNTER — Ambulatory Visit (INDEPENDENT_AMBULATORY_CARE_PROVIDER_SITE_OTHER): Payer: 59 | Admitting: Internal Medicine

## 2012-03-02 ENCOUNTER — Encounter: Payer: Self-pay | Admitting: Internal Medicine

## 2012-03-02 ENCOUNTER — Other Ambulatory Visit: Payer: 59

## 2012-03-02 VITALS — BP 163/89 | HR 83 | Temp 98.5°F | Resp 16 | Ht 61.0 in | Wt 136.0 lb

## 2012-03-02 DIAGNOSIS — R339 Retention of urine, unspecified: Secondary | ICD-10-CM

## 2012-03-02 DIAGNOSIS — E278 Other specified disorders of adrenal gland: Secondary | ICD-10-CM | POA: Insufficient documentation

## 2012-03-02 DIAGNOSIS — D3500 Benign neoplasm of unspecified adrenal gland: Secondary | ICD-10-CM

## 2012-03-02 DIAGNOSIS — I10 Essential (primary) hypertension: Secondary | ICD-10-CM | POA: Insufficient documentation

## 2012-03-02 LAB — POCT URINALYSIS DIPSTICK
Bilirubin, UA: NEGATIVE
Glucose, UA: NEGATIVE
Ketones, UA: NEGATIVE
Leukocytes, UA: NEGATIVE
Nitrite, UA: NEGATIVE
Protein, UA: NEGATIVE
Spec Grav, UA: 1.025
Urobilinogen, UA: 0.2
pH, UA: 6

## 2012-03-02 NOTE — Patient Instructions (Signed)
Follow up as needed

## 2012-03-02 NOTE — Progress Notes (Signed)
Subjective:    Patient ID: Nicole Crosby, female    DOB: 1966/08/08, 46 y.o.   MRN: [id number]  HPI Yenni is here for acute visit.   She reports she was concerned over decreased urinary output yesterday and a few days last week.  She states she went for 7 hours without urinating.  Recently had started low doe Atenolol 25 mg for her palpitations and wasn't sure if this was related  See Ct of 02/2012.  Mention of 1.3 cm adrenal nodule that has not changed from 2009.  Of note in 2009 no mention of adrenal nodule on CT.  She does have hepatic cysts that have increased in size  She is about to start her menses   Allergies  Allergen Reactions  . Sulfonamide Derivatives Rash     Rash   Past Medical History  Diagnosis Date  . IBS (irritable bowel syndrome)   . Insomnia   . Other abnormal glucose     Tiny hypodensity left hepatic lobe-CT of abdomen and pelvis 07/26/2008-small 8mm periumblical hernia  . PAC (premature atrial contraction)   . Hyperlipidemia   . GERD (gastroesophageal reflux disease)   . Hiatal hernia    Past Surgical History  Procedure Date  . De quervain's release 2000    Right  . Cesarean section 01/2001   History   Social History  . Marital Status: Married    Spouse Name: N/A    Number of Children: N/A  . Years of Education: N/A   Occupational History  . Not on file.   Social History Main Topics  . Smoking status: Never Smoker   . Smokeless tobacco: Never Used  . Alcohol Use: 0.6 oz/week    1 Glasses of wine per week     occasional  . Drug Use: No  . Sexually Active: Yes    Birth Control/ Protection: Inserts   Other Topics Concern  . 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 Alcohol Use - yes-wine      Full Time Married        Family History  Problem Relation Age of Onset  . Hypertension Mother   . Hyperlipidemia Mother   . Fibrocystic breast disease Mother   . Hearing loss Mother     . Osteopenia Mother   . Prostate cancer Father 51    deceased  . Hypertension Father   . Cancer Father     prostate  . Diabetes Father   . Heart disease Father     MI  . Kidney disease Father   . Other Brother     unknown  . Thyroid disease Sister   . Diabetes Sister     pre-diabetes  . Hypertension Sister   . Arthritis Sister   . Diabetes Sister   . Hypertension Sister   . Hyperlipidemia Sister   . Fibrocystic breast disease Sister   . Colon cancer      uncle  died in late 28's  . Diabetes Maternal Grandmother   . Cancer Maternal Grandfather     prostate  . COPD Maternal Grandfather   . Learning disabilities Paternal Grandfather    Patient Active Problem List  Diagnoses  . HYPERLIPIDEMIA  . DYSTHYMIA  . UNSPECIFIED SUBJECTIVE VISUAL DISTURBANCE  . GERD  . HIATAL HERNIA  . IRRITABLE BOWEL SYNDROME  . HEPATIC CYST  . HIP PAIN, LEFT  . NECK PAIN  . SCIATICA,  LEFT  . MEDIAL EPICONDYLITIS, RIGHT  . BURSITIS, LEFT HIP  . DIZZINESS  . INSOMNIA  . HEADACHE  . PALPITATIONS  . DYSPNEA  . CHEST PAIN, ATYPICAL  . ABDOMINAL BLOATING  . ABDOMINAL PAIN, UNSPECIFIED SITE  . ABDOMINAL PAIN, EPIGASTRIC  . OTHER ABNORMAL GLUCOSE  . ELEVATED BP READING WITHOUT DX HYPERTENSION  . Left leg pain  . IBS (irritable bowel syndrome)  . PAC (premature atrial contraction)  . Insomnia  . Hyperlipidemia  . GERD (gastroesophageal reflux disease)   Current Outpatient Prescriptions on File Prior to Visit  Medication Sig Dispense Refill  . aluminum chloride (DRYSOL) 20 % external solution Apply topically at bedtime.  35 mL  2  . Calcium Carb-Cholecalciferol (CALCIUM PLUS VITAMIN D3) 600-500 MG-UNIT CAPS Take 1 tablet by mouth daily.        . clidinium-chlordiazePOXIDE (LIBRAX) 2.5-5 MG per capsule Take 1 capsule by mouth 3 (three) times daily as needed.  60 capsule  0  . etonogestrel-ethinyl estradiol (NUVARING) 0.12-0.015 MG/24HR vaginal ring Insert vaginally and leave in place for  3 consecutive weeks, then remove for 1 week.  1 each  5  . Melatonin 3 MG CAPS Take 1 capsule by mouth as needed.        . metoprolol succinate (TOPROL XL) 25 MG 24 hr tablet Take 1 tablet (25 mg total) by mouth daily.  30 tablet  11  . Multiple Vitamin (MULTIVITAMIN) tablet Take 1 tablet by mouth daily.        Marland Kitchen NEXIUM 40 MG capsule TAKE 1 CAPSULE (40 MG TOTAL) BY MOUTH DAILY BEFORE BREAKFAST.  30 capsule  3       Review of Systems See HPI    Objective:   Physical Exam Physical Exam  Nursing note and vitals reviewed.  Constitutional: She is oriented to person, place, and time. She appears well-developed and well-nourished.  HENT:  Head: Normocephalic and atraumatic.  Cardiovascular: Normal rate and regular rhythm. Exam reveals no gallop and no friction rub.  No murmur heard.  Pulmonary/Chest: Breath sounds normal. She has no wheezes. She has no rales.  ABD:  No CVA tenderness soft non tender Neurological: She is alert and oriented to person, place, and time.  Skin: Skin is warm and dry.  Psychiatric: She has a normal mood and affect. Her behavior is normal. Ext no edema        Assessment & Plan:  1)  Decreased urinary output  Normal Ct.  Normal bun/creat normal clinical exam.  Follow expectsantly.  2)  Adrenal nodule  No change in size sine 2009  Repeat 6 mnths to one year 3)  HTN continue atenolol .  She has follow up with Dr. Jens Som in 8 weeks  May need to increase dose.  Unlikely urine output changes related to beta blocker 4)  Hepatic cyst

## 2012-04-25 ENCOUNTER — Other Ambulatory Visit: Payer: Self-pay | Admitting: Internal Medicine

## 2012-04-27 ENCOUNTER — Ambulatory Visit (INDEPENDENT_AMBULATORY_CARE_PROVIDER_SITE_OTHER): Payer: 59 | Admitting: Cardiology

## 2012-04-27 ENCOUNTER — Encounter: Payer: Self-pay | Admitting: Cardiology

## 2012-04-27 VITALS — BP 132/90 | HR 89 | Ht 61.0 in | Wt 141.0 lb

## 2012-04-27 DIAGNOSIS — I491 Atrial premature depolarization: Secondary | ICD-10-CM

## 2012-04-27 DIAGNOSIS — R002 Palpitations: Secondary | ICD-10-CM

## 2012-04-27 DIAGNOSIS — I10 Essential (primary) hypertension: Secondary | ICD-10-CM

## 2012-04-27 DIAGNOSIS — E785 Hyperlipidemia, unspecified: Secondary | ICD-10-CM

## 2012-04-27 DIAGNOSIS — R0789 Other chest pain: Secondary | ICD-10-CM

## 2012-04-27 MED ORDER — METOPROLOL SUCCINATE ER 50 MG PO TB24: 50.0000 mg | ORAL_TABLET | Freq: Every day | ORAL | Status: DC

## 2012-04-27 NOTE — Assessment & Plan Note (Signed)
Management per primary care. 

## 2012-04-27 NOTE — Assessment & Plan Note (Signed)
Palpitations somewhat improved but persist. Increase Toprol to 50 mg daily. Some fatigue. We will follow this and adjust medications if needed. This certainly could be related to beta-blockade.

## 2012-04-27 NOTE — Assessment & Plan Note (Signed)
Previous functional studies negative. Symptoms atypical. No further cardiac evaluation.

## 2012-04-27 NOTE — Progress Notes (Signed)
HPI: Pleasant female for fu of palpitations and chest pain. Previous workup in 2005 apparently normal per patient. She had an echocardiogram in August of 2010 that revealed normal LV function. No valvular abnormalities were noted. Note previous TSH was normal. Stress echocardiogram was performed on October 10, 2009 and was normal. A CardioNet was also performed and showed sinus to sinus tachycardia with rare PAC. Stress echo repeated May 2012 and was normal. DDimer elevated but chest CT showed no pulmonary embolus. Labs in March of 2013 showed normal LFTs, Hgb, K and TSH. When I last saw her in April of 2013, we added toprol for palpitations. Since then, her palpitations have improved but persists. There is no dyspnea on exertion but she does occasionally have chest pain described as pressure. It lasts seconds and is not exertional. Not pleuritic, positional or related to food.  Current Outpatient Prescriptions  Medication Sig Dispense Refill  . aluminum chloride (DRYSOL) 20 % external solution Apply topically at bedtime.  35 mL  2  . Calcium Carb-Cholecalciferol (CALCIUM PLUS VITAMIN D3) 600-500 MG-UNIT CAPS Take 1 tablet by mouth daily.        . clidinium-chlordiazePOXIDE (LIBRAX) 2.5-5 MG per capsule Take 1 capsule by mouth 3 (three) times daily as needed.  60 capsule  0  . Melatonin 3 MG CAPS Take 1 capsule by mouth as needed.        . metoprolol succinate (TOPROL XL) 25 MG 24 hr tablet Take 1 tablet (25 mg total) by mouth daily.  30 tablet  11  . Multiple Vitamin (MULTIVITAMIN) tablet Take 1 tablet by mouth daily.        Marland Kitchen NEXIUM 40 MG capsule TAKE 1 CAPSULE (40 MG TOTAL) BY MOUTH DAILY BEFORE BREAKFAST.  30 capsule  3  . NUVARING 0.12-0.015 MG/24HR vaginal ring INSERT VAGINALLY AND LEAVE IN PLACE FOR 3 CONSECUTIVE WEEKS, THEN REMOVE FOR 1 WEEK.  1 each  5     Past Medical History  Diagnosis Date  . IBS (irritable bowel syndrome)   . Insomnia   . Other abnormal glucose     Tiny  hypodensity left hepatic lobe-CT of abdomen and pelvis 07/26/2008-small 8mm periumblical hernia  . PAC (premature atrial contraction)   . Hyperlipidemia   . GERD (gastroesophageal reflux disease)   . Hiatal hernia     Past Surgical History  Procedure Date  . De quervain's release 2000    Right  . Cesarean section 01/2001    History   Social History  . Marital Status: Married    Spouse Name: N/A    Number of Children: N/A  . Years of Education: N/A   Occupational History  . Not on file.   Social History Main Topics  . Smoking status: Never Smoker   . Smokeless tobacco: Never Used  . Alcohol Use: 0.6 oz/week    1 Glasses of wine per week     occasional  . Drug Use: No  . Sexually Active: Yes    Birth Control/ Protection: Inserts   Other Topics Concern  . 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 Alcohol Use - yes-wine      Full Time Married         ROS: Some HA, nausea, constipation but no fevers or chills, productive cough, hemoptysis, dysphasia, odynophagia, melena, hematochezia, dysuria, hematuria, rash, seizure activity, orthopnea, PND, pedal edema, claudication. Remaining systems are negative.  Physical  Exam: Well-developed well-nourished in no acute distress.  Skin is warm and dry.  HEENT is normal.  Neck is supple.  Chest is clear to auscultation with normal expansion.  Cardiovascular exam is regular rate and rhythm.  Abdominal exam nontender or distended. No masses palpated. Extremities show no edema. neuro grossly intact  ECG normal sinus rhythm at a rate of 89. No ST changes.

## 2012-04-27 NOTE — Assessment & Plan Note (Signed)
Change Toprol to 50 mg daily.

## 2012-04-27 NOTE — Patient Instructions (Addendum)
Your physician wants you to follow-up in: 6 MONTHS WITH DR Jens Som IN [place] You will receive a reminder letter in the mail two months in advance. If you don't receive a letter, please call our office to schedule the follow-up appointment.   INCREASE METOPROLOL TO 50 MG ONCE DAILY.

## 2012-04-27 NOTE — Assessment & Plan Note (Signed)
Blood pressure elevated. Increase Toprol to 50 mg daily both for blood pressure and palpitations.

## 2012-05-24 ENCOUNTER — Encounter: Payer: Self-pay | Admitting: Internal Medicine

## 2012-06-23 ENCOUNTER — Telehealth: Payer: Self-pay | Admitting: Cardiology

## 2012-06-23 NOTE — Telephone Encounter (Signed)
Above BP is reasonable; would continue present meds Olga Millers

## 2012-06-23 NOTE — Telephone Encounter (Signed)
New problem:  Patient calling stating metoprolol is not working. B/p is still increase . Today 1307/74. Patient states she take her b/p in the evening an the ranges has been from 140 .

## 2012-06-23 NOTE — Telephone Encounter (Signed)
Spoke with pt, Aware of dr crenshaw's recommendations.  °

## 2012-06-23 NOTE — Telephone Encounter (Signed)
Spoke with pt, she takes her bp in the evening before she take her metoprolol at bedtime. Her bp is ranging from 130-140's/76-84. She also reports her PAC's are no better. She does not want to increase metoprolol because she is having fatigue now. Will forward for dr Jens Som review

## 2012-07-11 ENCOUNTER — Encounter: Payer: Self-pay | Admitting: Internal Medicine

## 2012-07-11 ENCOUNTER — Other Ambulatory Visit: Payer: Self-pay | Admitting: Internal Medicine

## 2012-07-11 ENCOUNTER — Ambulatory Visit (INDEPENDENT_AMBULATORY_CARE_PROVIDER_SITE_OTHER): Payer: 59 | Admitting: Internal Medicine

## 2012-07-11 VITALS — BP 118/70 | HR 90 | Ht 61.0 in | Wt 140.1 lb

## 2012-07-11 DIAGNOSIS — K3189 Other diseases of stomach and duodenum: Secondary | ICD-10-CM

## 2012-07-11 DIAGNOSIS — R1013 Epigastric pain: Secondary | ICD-10-CM

## 2012-07-11 DIAGNOSIS — K30 Functional dyspepsia: Secondary | ICD-10-CM

## 2012-07-11 MED ORDER — BUSPIRONE HCL 10 MG PO TABS: 10.0000 mg | ORAL_TABLET | Freq: Two times a day (BID) | ORAL | Status: DC

## 2012-07-11 NOTE — Patient Instructions (Addendum)
We have sent the following medications to your pharmacy for you to pick up at your convenience: Generic Buspar  Please follow-up with [place] in 6 weeks.  Thank you for choosing me and [facility].  Iva Boop, M.D., [facility]

## 2012-07-14 NOTE — Progress Notes (Signed)
Subjective:    Patient ID: Nicole Crosby, female    DOB: [birth year], 46 y.o.   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 She is complaining of problems with upper abdominal pain, occurring after meals. There is associated early satiety. On times the pain radiates up into the chest and there is associated heartburn. There is intermittent nausea at times, sometimes at night. She relates that she was doing well until she lost about 8 pounds in the summer of 2012. She did this intentionally. She went on vacation she gained some weight back and ever since then she is felt as described above. Nexium has helped but has not resolved the symptoms. She has gained weight though she is not obese, but is concerned that she cannot lose despite exercising and decreasing portions. She did have similar symptoms about 3 years ago. He discuss a possible endoscopy at that time. Then she did not follow through as she felt better. She has not had bleeding, dysphagia or anemia, or unintentional weight loss. GI review of systems is otherwise positive for history of irritable bowel syndrome. Allergies  Allergen Reactions  . Sulfonamide Derivatives Rash     Rash   Outpatient Prescriptions Prior to Visit  Medication Sig Dispense Refill  . Calcium Carb-Cholecalciferol (CALCIUM PLUS VITAMIN D3) 600-500 MG-UNIT CAPS Take 1 tablet by mouth daily.        . clidinium-chlordiazePOXIDE (LIBRAX) 2.5-5 MG per capsule Take 1 capsule by mouth 3 (three) times daily as needed.  60 capsule  0  . Melatonin 3 MG CAPS Take 1 capsule by mouth as needed.        . metoprolol succinate (TOPROL XL) 50 MG 24 hr tablet Take 1 tablet (50 mg total) by mouth daily.  30 tablet  11  . Multiple Vitamin (MULTIVITAMIN) tablet Take 1 tablet by mouth daily.        Marland Kitchen NUVARING 0.12-0.015 MG/24HR vaginal ring INSERT VAGINALLY AND LEAVE IN PLACE FOR 3 CONSECUTIVE  WEEKS, THEN REMOVE FOR 1 WEEK.  1 each  5  . aluminum chloride (DRYSOL) 20 % external solution Apply topically at bedtime.  35 mL  2  . NEXIUM 40 MG capsule TAKE 1 CAPSULE (40 MG TOTAL) BY MOUTH DAILY BEFORE BREAKFAST.  30 capsule  3   Past Medical History  Diagnosis Date  . IBS (irritable bowel syndrome)   . Insomnia   . Other abnormal glucose     Tiny hypodensity left hepatic lobe-CT of abdomen and pelvis 07/26/2008-small 8mm periumblical hernia  . PAC (premature atrial contraction)   . Hyperlipidemia   . GERD (gastroesophageal reflux disease)   . Hiatal hernia   . Hypertension     pt denies  . Palpitations    Past Surgical History  Procedure Date  . De quervain's release 2000    Right  . Cesarean section 01/2001   History   Social History  . Marital Status: Married    Spouse Name: N/A    Number of Children: 2  . Years of Education: N/A   Occupational History  . Xray/CT Tech [employer]   Social History Main Topics  . Smoking status: Never Smoker   . Smokeless tobacco: Never Used  . Alcohol Use: 0.6 oz/week    1 Glasses of wine per week     occasional  . Drug Use: No  . Sexually Active:  Yes    Birth Control/ Protection: Inserts   Other Topics Concern  . 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 Alcohol Use - yes-wine      Full Time Married        Family History  Problem Relation Age of Onset  . Hypertension Mother   . Hyperlipidemia Mother   . Fibrocystic breast disease Mother   . Hearing loss Mother   . Osteopenia Mother   . Prostate cancer Father 33    deceased  . Hypertension Father   . Diabetes Father   . Heart disease Father     MI  . Kidney disease Father   . Other Brother     unknown  . Thyroid disease Sister   . Diabetes Sister     pre-diabetes  . Hypertension Sister   . Arthritis Sister   . Diabetes Sister   . Hypertension Sister   . Hyperlipidemia Sister   . Fibrocystic breast  disease Sister   . Colon cancer Maternal Uncle     died in late 41's  . Diabetes Maternal Grandmother   . Prostate cancer Maternal Grandfather   . COPD Maternal Grandfather   . Learning disabilities Paternal Grandfather         Review of Systems Otherwise negative, she denies significant stressors other than usual once. She does not elaborate. All other review of systems negative or accept as per history of present illness.    Objective:   Physical Exam General:  Well-developed, well-nourished and in no acute distress Eyes:  anicteric. ENT:   Mouth and posterior pharynx free of lesions.  Neck:   supple w/o thyromegaly or mass.  Lungs: Clear to auscultation bilaterally. Heart:  S1S2, no rubs, murmurs, gallops. Abdomen:  soft, non-tender, no hepatosplenomegaly, hernia, or mass and BS+.  Rectal: Lymph:  no cervical or supraclavicular adenopathy. Extremities:   no edema Skin   no rash. Neuro:  A&O x 3.  Psych:  appropriate mood and  Affect.   Data Reviewed:        Assessment & Plan:   1. Functional dyspepsia    1. We reviewed pursuing an endoscopy versus empiric treatment. Given that she doesn't have any weight loss dysphagia or bleeding or anemia, it is reasonable to try medical therapy. She was offered an upper endoscopy but we have decided together to pursue for buspirone treatment, 10 mg twice daily. This is an anti-anxiety agent but it does help with early satiety symptoms in patients with impaired gastric accommodation which she may have based upon her symptom complex. 2. She will call up in 6-8 weeks to make sure that she is better, if she is not improving an upper endoscopy will likely be undertaken.   I appreciate the opportunity to care for this patient.   CC: SCHOENHOFF,DEBBIE, MD

## 2012-08-02 ENCOUNTER — Telehealth: Payer: Self-pay | Admitting: Internal Medicine

## 2012-08-02 NOTE — Telephone Encounter (Signed)
FYI- see comments on call

## 2012-08-03 ENCOUNTER — Telehealth: Payer: Self-pay | Admitting: *Deleted

## 2012-08-03 NOTE — Telephone Encounter (Signed)
Ok  Nicole Crosby  

## 2012-08-03 NOTE — Telephone Encounter (Signed)
Pt called this am to let us know she is stopping her metoprolol today. It is causing a lot of fatigue. She tried 1/2 tablet with no change in symptoms, therefore she is going to stop the metoprolol. Will forward for dr Jens Som review

## 2012-08-11 ENCOUNTER — Telehealth: Payer: Self-pay | Admitting: Internal Medicine

## 2012-08-11 NOTE — Telephone Encounter (Signed)
Pt came in needing to see the doctor  She states she having dizziness and loss of balance... I advise pt to go to an urgent care or er if the matter gets worse... Told her the dr will be out of town today and Monday... She spoke understanding.Marland KitchenMarland Kitchen

## 2012-08-16 ENCOUNTER — Encounter: Payer: Self-pay | Admitting: Internal Medicine

## 2012-08-16 ENCOUNTER — Ambulatory Visit (INDEPENDENT_AMBULATORY_CARE_PROVIDER_SITE_OTHER): Payer: 59 | Admitting: Internal Medicine

## 2012-08-16 VITALS — BP 152/86 | HR 84 | Temp 97.8°F | Resp 18 | Wt 141.0 lb

## 2012-08-16 DIAGNOSIS — G514 Facial myokymia: Secondary | ICD-10-CM | POA: Insufficient documentation

## 2012-08-16 DIAGNOSIS — G518 Other disorders of facial nerve: Secondary | ICD-10-CM

## 2012-08-16 DIAGNOSIS — R269 Unspecified abnormalities of gait and mobility: Secondary | ICD-10-CM

## 2012-08-16 DIAGNOSIS — R42 Dizziness and giddiness: Secondary | ICD-10-CM

## 2012-08-16 LAB — SEDIMENTATION RATE: Sed Rate: 1 mm/h (ref 0–22)

## 2012-08-16 NOTE — Patient Instructions (Addendum)
Will set up referral to neurology   Will set up referral to vestibular rehab

## 2012-08-16 NOTE — Progress Notes (Signed)
Subjective:    Patient ID: Nicole Crosby, female    DOB: 1966-04-20, 46 y.o.   MRN: [id number]  HPI [name] is here with multiple concerns.  She has had vertigo like symptoms for several weeks now.  She [place] taking OTC Antivert but it only helps minimally.  She also has gait disturbance where she feels "off balance" .  She does not describe a spinning sensation.  Symptoms also associated with facial twitching.  She denies visual or speech changes, muscle weakness  MRI w/wo done 09/2011 contrast essentially normal  She also has chronic neck pain that extends across shoulders.  She has been evaluated by Dr. Artist Pais and has PT in the past.  Allergies  Allergen Reactions  . Sulfonamide Derivatives Rash     Rash   Past Medical History  Diagnosis Date  . IBS (irritable bowel syndrome)   . Insomnia   . Other abnormal glucose     Tiny hypodensity left hepatic lobe-CT of abdomen and pelvis 07/26/2008-small 8mm periumblical hernia  . PAC (premature atrial contraction)   . Hyperlipidemia   . GERD (gastroesophageal reflux disease)   . Hiatal hernia   . Hypertension     pt denies  . Palpitations    Past Surgical History  Procedure Date  . De quervain's release 2000    Right  . Cesarean section 01/2001   History   Social History  . Marital Status: Married    Spouse Name: N/A    Number of Children: 2  . Years of Education: N/A   Occupational History  . 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 Smoking status: Never Smoker   . Smokeless tobacco: Never Used  . Alcohol Use: 0.6 oz/week    1 Glasses of wine per week     occasional  . Drug Use: No  . Sexually Active: Yes    Birth Control/ Protection: Inserts   Other Topics Concern  . 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 Alcohol Use - yes-wine      Full Time Married        Family History  Problem Relation Age of Onset  . Hypertension Mother   .  Hyperlipidemia Mother   . Fibrocystic breast disease Mother   . Hearing loss Mother   . Osteopenia Mother   . Prostate cancer Father 19    deceased  . Hypertension Father   . Diabetes Father   . Heart disease Father     MI  . Kidney disease Father   . Other Brother     unknown  . Thyroid disease Sister   . Diabetes Sister     pre-diabetes  . Hypertension Sister   . Arthritis Sister   . Diabetes Sister   . Hypertension Sister   . Hyperlipidemia Sister   . Fibrocystic breast disease Sister   . Colon cancer Maternal Uncle     died in late 34's  . Diabetes Maternal Grandmother   . Prostate cancer Maternal Grandfather   . COPD Maternal Grandfather   . Learning disabilities Paternal Grandfather    Patient Active Problem List  Diagnosis  . HYPERLIPIDEMIA  . DYSTHYMIA  . HEPATIC CYST  . NECK PAIN  . SCIATICA, LEFT  . MEDIAL EPICONDYLITIS, RIGHT  . INSOMNIA  . HEADACHE  . PALPITATIONS  . ABDOMINAL BLOATING  . ABDOMINAL PAIN, UNSPECIFIED SITE  . ABDOMINAL PAIN,  EPIGASTRIC  . OTHER ABNORMAL GLUCOSE  . Left leg pain  . IBS (irritable bowel syndrome)  . PAC (premature atrial contraction)  . Insomnia  . GERD (gastroesophageal reflux disease)  . Adrenal nodule  . Essential hypertension, benign  . Gait disturbance  . Facial twitching   Current Outpatient Prescriptions on File Prior to Visit  Medication Sig Dispense Refill  . Calcium Carb-Cholecalciferol (CALCIUM PLUS VITAMIN D3) 600-500 MG-UNIT CAPS Take 1 tablet by mouth daily.        . clidinium-chlordiazePOXIDE (LIBRAX) 2.5-5 MG per capsule Take 1 capsule by mouth 3 (three) times daily as needed.  60 capsule  0  . Melatonin 3 MG CAPS Take 1 capsule by mouth as needed.        . Multiple Vitamin (MULTIVITAMIN) tablet Take 1 tablet by mouth daily.        Marland Kitchen NEXIUM 40 MG capsule TAKE 1 CAPSULE (40 MG TOTAL) BY MOUTH DAILY BEFORE BREAKFAST.  30 capsule  5  . NUVARING 0.12-0.015 MG/24HR vaginal ring INSERT VAGINALLY AND LEAVE  IN PLACE FOR 3 CONSECUTIVE WEEKS, THEN REMOVE FOR 1 WEEK.  1 each  5  . aluminum chloride (DRYSOL) 20 % external solution Apply topically as needed.      . busPIRone (BUSPAR) 10 MG tablet Take 1 tablet (10 mg total) by mouth 2 (two) times daily. Start with 1/2 tablet for fisrt week then use whole tablet  60 tablet  1  . metoprolol succinate (TOPROL XL) 50 MG 24 hr tablet Take 1 tablet (50 mg total) by mouth daily.  30 tablet  11         Review of Systems    see HPI Objective:   Physical Exam Physical Exam  Nursing note and vitals reviewed.  Constitutional: She is oriented to person, place, and time. She appears well-developed and well-nourished.  HENT:  Head: Normocephalic and atraumatic.  Cardiovascular: Normal rate and regular rhythm. Exam reveals no gallop and no friction rub.  No murmur heard.  Pulmonary/Chest: Breath sounds normal. She has no wheezes. She has no rales.  Neurological: She is alert and oriented to person, place, and time.  CNII- XII intact Reflexes 2+ symmetric Motor 5/5 UE and LE Sensory  Intact to pp Cerebellar  Romberg neg  Intact  FTN Skin: Skin is warm and dry.  Psychiatric: She has a normal mood and affect. Her behavior is normal.              Assessment & Plan:  S/S complex persistant vertigo,   Gait disturbance,  Facial twitching.   Etiology unclear.  Origin of symptoms could be neurologic or ENT.    Normal brain MRI 2012.   wiill get neurology opinion and will also refer to vestibular rehab.  OK to continue Antivert.  May need ENT at some point.  Will also check preliminary auto immune markers with ANA and sed rate  Neck pain  Previous work up neg

## 2012-08-17 ENCOUNTER — Telehealth: Payer: Self-pay | Admitting: *Deleted

## 2012-08-17 ENCOUNTER — Encounter: Payer: Self-pay | Admitting: *Deleted

## 2012-08-17 LAB — ANA: Anti Nuclear Antibody(ANA): NEGATIVE

## 2012-08-17 NOTE — Telephone Encounter (Signed)
Labs mailed to home address.  

## 2012-08-17 NOTE — Telephone Encounter (Signed)
Message copied by Mathews Robinsons on Wed Aug 17, 2012  3:53 PM ------      Message from: Raechel Chute D      Created: Wed Aug 17, 2012 10:54 AM       Ok to mail or give labs to pt

## 2012-08-18 ENCOUNTER — Encounter: Payer: Self-pay | Admitting: Internal Medicine

## 2012-08-18 ENCOUNTER — Ambulatory Visit (INDEPENDENT_AMBULATORY_CARE_PROVIDER_SITE_OTHER): Payer: 59 | Admitting: Internal Medicine

## 2012-08-18 ENCOUNTER — Telehealth: Payer: Self-pay | Admitting: Internal Medicine

## 2012-08-18 VITALS — BP 160/90

## 2012-08-18 DIAGNOSIS — R51 Headache: Secondary | ICD-10-CM

## 2012-08-18 DIAGNOSIS — I10 Essential (primary) hypertension: Secondary | ICD-10-CM

## 2012-08-18 DIAGNOSIS — G4700 Insomnia, unspecified: Secondary | ICD-10-CM

## 2012-08-18 MED ORDER — OLMESARTAN MEDOXOMIL 20 MG PO TABS: 20.0000 mg | ORAL_TABLET | Freq: Every day | ORAL | Status: DC

## 2012-08-18 NOTE — Progress Notes (Signed)
Subjective:    Patient ID: Nicole Crosby, female    DOB: 1966-03-09, 46 y.o.   MRN: [id number]  HPI  Nicole Crosby is here for follow up.  See BP  She hreports headache last night. NO chest pain or pressure  She stopped taking her metoprolol a few weeks ago as she felt she did not need it for her PAC"S  Allergies  Allergen Reactions  . Sulfonamide Derivatives Rash     Rash   Past Medical History  Diagnosis Date  . IBS (irritable bowel syndrome)   . Insomnia   . Other abnormal glucose     Tiny hypodensity left hepatic lobe-CT of abdomen and pelvis 07/26/2008-small 8mm periumblical hernia  . PAC (premature atrial contraction)   . Hyperlipidemia   . GERD (gastroesophageal reflux disease)   . Hiatal hernia   . Hypertension     pt denies  . Palpitations    Past Surgical History  Procedure Date  . De quervain's release 2000    Right  . Cesarean section 01/2001   History   Social History  . Marital Status: Married    Spouse Name: N/A    Number of Children: 2  . Years of Education: N/A   Occupational History  . Xray/CT Tech [employer]   Social History Main Topics  . Smoking status: Never Smoker   . Smokeless tobacco: Never Used  . Alcohol Use: 0.6 oz/week    1 Glasses of wine per week     occasional  . Drug Use: No  . Sexually Active: Yes    Birth Control/ Protection: Inserts   Other Topics Concern  . 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 Alcohol Use - yes-wine      Full Time Married        Family History  Problem Relation Age of Onset  . Hypertension Mother   . Hyperlipidemia Mother   . Fibrocystic breast disease Mother   . Hearing loss Mother   . Osteopenia Mother   . Prostate cancer Father 19    deceased  . Hypertension Father   . Diabetes Father   . Heart disease Father     MI  . Kidney disease Father   . Other Brother     unknown  . Thyroid disease Sister   . Diabetes Sister    pre-diabetes  . Hypertension Sister   . Arthritis Sister   . Diabetes Sister   . Hypertension Sister   . Hyperlipidemia Sister   . Fibrocystic breast disease Sister   . Colon cancer Maternal Uncle     died in late 31's  . Diabetes Maternal Grandmother   . Prostate cancer Maternal Grandfather   . COPD Maternal Grandfather   . Learning disabilities Paternal Grandfather    Patient Active Problem List  Diagnosis  . HYPERLIPIDEMIA  . DYSTHYMIA  . HEPATIC CYST  . NECK PAIN  . SCIATICA, LEFT  . MEDIAL EPICONDYLITIS, RIGHT  . INSOMNIA  . HEADACHE  . PALPITATIONS  . ABDOMINAL BLOATING  . ABDOMINAL PAIN, UNSPECIFIED SITE  . ABDOMINAL PAIN, EPIGASTRIC  . OTHER ABNORMAL GLUCOSE  . Left leg pain  . IBS (irritable bowel syndrome)  . PAC (premature atrial contraction)  . Insomnia  . GERD (gastroesophageal reflux disease)  . Adrenal nodule  . Essential hypertension, benign  . Gait disturbance  . Facial twitching   Current Outpatient Prescriptions on File Prior  to Visit  Medication Sig Dispense Refill  . aluminum chloride (DRYSOL) 20 % external solution Apply topically as needed.      . busPIRone (BUSPAR) 10 MG tablet Take 1 tablet (10 mg total) by mouth 2 (two) times daily. Start with 1/2 tablet for fisrt week then use whole tablet  60 tablet  1  . Calcium Carb-Cholecalciferol (CALCIUM PLUS VITAMIN D3) 600-500 MG-UNIT CAPS Take 1 tablet by mouth daily.        . clidinium-chlordiazePOXIDE (LIBRAX) 2.5-5 MG per capsule Take 1 capsule by mouth 3 (three) times daily as needed.  60 capsule  0  . Melatonin 3 MG CAPS Take 1 capsule by mouth as needed.        . metoprolol succinate (TOPROL XL) 50 MG 24 hr tablet Take 1 tablet (50 mg total) by mouth daily.  30 tablet  11  . Multiple Vitamin (MULTIVITAMIN) tablet Take 1 tablet by mouth daily.        Marland Kitchen NEXIUM 40 MG capsule TAKE 1 CAPSULE (40 MG TOTAL) BY MOUTH DAILY BEFORE BREAKFAST.  30 capsule  5  . NUVARING 0.12-0.015 MG/24HR vaginal ring  INSERT VAGINALLY AND LEAVE IN PLACE FOR 3 CONSECUTIVE WEEKS, THEN REMOVE FOR 1 WEEK.  1 each  5  . olmesartan (BENICAR) 20 MG tablet Take 1 tablet (20 mg total) by mouth daily.  30 tablet  1      Review of Systems    see HPI Objective:   Physical Exam Physical Exam  Nursing note and vitals reviewed.  Constitutional: She is oriented to person, place, and time. She appears well-developed and well-nourished.  HENT:  Head: Normocephalic and atraumatic.  Cardiovascular: Normal rate and regular rhythm. Exam reveals no gallop and no friction rub.  No murmur heard.  Pulmonary/Chest: Breath sounds normal. She has no wheezes. She has no rales.  Neurological: She is alert and oriented to person, place, and time.  Skin: Skin is warm and dry.  Psychiatric: She has a normal mood and affect. Her behavior is normal.              Assessment & Plan:  HTN  Will start Benicar 20 mg one daily  Headache  Likely related to above  Insomnia  See me in 10 days

## 2012-08-18 NOTE — Patient Instructions (Addendum)
See me in 10 days  

## 2012-08-18 NOTE — Telephone Encounter (Signed)
Pt would like to know if a MUSCLE RELAXER will help with her neck pain and pressure... Please call pt at [phone].Marland KitchenMarland Kitchen

## 2012-08-22 ENCOUNTER — Encounter: Payer: Self-pay | Admitting: *Deleted

## 2012-08-22 ENCOUNTER — Ambulatory Visit (INDEPENDENT_AMBULATORY_CARE_PROVIDER_SITE_OTHER): Payer: 59

## 2012-08-22 ENCOUNTER — Encounter: Payer: Self-pay | Admitting: Sports Medicine

## 2012-08-22 ENCOUNTER — Ambulatory Visit (INDEPENDENT_AMBULATORY_CARE_PROVIDER_SITE_OTHER): Payer: 59 | Admitting: Sports Medicine

## 2012-08-22 VITALS — BP 179/102 | HR 92 | Wt 142.0 lb

## 2012-08-22 DIAGNOSIS — R209 Unspecified disturbances of skin sensation: Secondary | ICD-10-CM

## 2012-08-22 DIAGNOSIS — M542 Cervicalgia: Secondary | ICD-10-CM | POA: Insufficient documentation

## 2012-08-22 DIAGNOSIS — IMO0001 Reserved for inherently not codable concepts without codable children: Secondary | ICD-10-CM

## 2012-08-22 MED ORDER — CYCLOBENZAPRINE HCL 10 MG PO TABS: ORAL_TABLET | ORAL | Status: DC

## 2012-08-22 MED ORDER — PREDNISONE 50 MG PO TABS: ORAL_TABLET | ORAL | Status: DC

## 2012-08-22 NOTE — Assessment & Plan Note (Addendum)
Trigger point injection as above. Flexeril, Voltaren, prednisone. Home rehabilitation handout given. X-rays of her cervical spine. She will come back to see me in 3-4 weeks, and if no better we can consider MRI, and some rheumatologic labs.

## 2012-08-22 NOTE — Progress Notes (Signed)
Subjective:    I'm seeing this patient as a consultation for:  Dr. Constance Goltz  CC: Neck pain  HPI: Nicole Crosby is a very pleasant 46 year old female with a long history of axial lower spine pain comes in with over a month of pain that she localizes in the midline of her neck, that radiates to both shoulders, and causing numbness and tingling that she localizes somewhat further down along her midline. She denies any pain, numbness, or weakness going to her hands or upper extremities. The pain is particularly bad with flexion, and is no worse with Valsalva, extension, rotation. She denies any trauma. She also denies any constitutional type symptoms.  Past medical history, Surgical history, Family history, Social history, Allergies, and medications have been entered into the medical record, reviewed, and no changes needed.   Review of Systems: No headache, visual changes, nausea, vomiting, diarrhea, constipation, dizziness, abdominal pain, skin rash, fevers, chills, night sweats, weight loss, swollen lymph nodes, body aches, joint swelling, muscle aches, chest pain, or shortness of breath.   Objective:   Vitals:  Afebrile, vital signs stable. General: Well Developed, well nourished, and in no acute distress.  Neuro/Psych: Alert and oriented x3, extra-ocular muscles intact, able to move all 4 extremities.  Skin: Warm and dry, no rashes noted.  Respiratory: Not using accessory muscles, speaking in full sentences, trachea midline.  Cardiovascular: Pulses palpable, no extremity edema. Abdomen: Does not appear distended. Neck: Inspection unremarkable. No palpable stepoffs. Tender to palpation between C5 and C6 spinous processes. Negative Spurling's maneuver. Full neck range of motion Grip strength and sensation normal in bilateral hands Strength good C4 to T1 distribution No sensory change to C4 to T1 Negative Hoffman sign bilaterally Reflexes normal  X-rays were ordered, complete cervical spine,  which are surprisingly devoid of degenerative change. Her cervical lordosis is somewhat accentuated.  Procedure:  Injection of midline cervical trigger point. Consent obtained and verified. Time-out conducted. Noted no overlying erythema, induration, or other signs of local infection. Skin prepped in a sterile fashion. Topical analgesic spray: Ethyl chloride. Completed without difficulty. Meds: 1 cc lidocaine, 1/2 cc Kenalog 40 injected in a fanlike pattern into the trigger point. Pain immediately improved suggesting accurate placement of the medication. Advised to call if fevers/chills, erythema, induration, drainage, or persistent bleeding.  Impression and Recommendations:   This case required medical decision making of moderate complexity.

## 2012-08-23 NOTE — Telephone Encounter (Signed)
See Ardenia note

## 2012-08-24 ENCOUNTER — Encounter: Payer: Self-pay | Admitting: Internal Medicine

## 2012-08-24 ENCOUNTER — Ambulatory Visit (INDEPENDENT_AMBULATORY_CARE_PROVIDER_SITE_OTHER): Payer: 59 | Admitting: Internal Medicine

## 2012-08-24 VITALS — BP 128/86 | HR 100 | Ht 61.0 in | Wt 141.2 lb

## 2012-08-24 DIAGNOSIS — K3189 Other diseases of stomach and duodenum: Secondary | ICD-10-CM

## 2012-08-24 DIAGNOSIS — K30 Functional dyspepsia: Secondary | ICD-10-CM

## 2012-08-24 MED ORDER — DICYCLOMINE HCL 20 MG PO TABS: 20.0000 mg | ORAL_TABLET | Freq: Three times a day (TID) | ORAL | Status: DC

## 2012-08-24 NOTE — Progress Notes (Signed)
  Subjective:    Patient ID: Nicole Crosby, female    DOB: [birth year], 46 y.o.   MRN: [id number]  HPI She returns for follow-up of dyspepsia - main problem has been early satiety. Buspirone x 1 week gave headaches and a nightmare so she stopped it. Less early satiety now. Not using Librax No weight loss or bleeding Does c/o excessive flatus  Medications, allergies, past medical history, past surgical history, family history and social history are reviewed and updated in the EMR.  Review of Systems + neck pain, tinnitus, dizziness - to see neurology    Objective:   Physical Exam General:  NAD Eyes:   anicteric Abdomen:  soft and nontender, BS+   Data Reviewed:  Lab Results  Component Value Date   WBC 11.0* 01/13/2012   HGB 14.6 01/13/2012   HCT 45.5 01/13/2012   MCV 96.0 01/13/2012   PLT 285 01/13/2012  CT abd/pelvis2013 (April) in EMR - ok       Assessment & Plan:   1. Functional dyspepsia    Symptoms are somewhat better. Offered EGD - suggested likely to be negative given lack of weight loss, anemia or bleeding but often needed to be sure - she prefers therapeutic trial Could use Librax but since it has Librium and could be habit-forming will try dicyclomine 20 mg qac. She is to let me know how she is in one month via MyChart Note that she has had problems prior to this past year as bloating on problem list since 2009. Speaks to  functional GI disturbance as cause. A low-dose TCA could be helpful if she would take it (note dysthymia on problem list)- await dicyclomine trial and neuro evaluation prior to that and still could end up with an EGD  WU:[id number],NFAOZH, MD

## 2012-08-24 NOTE — Patient Instructions (Addendum)
We have sent the following medications to your pharmacy for you to pick up at your convenience: Dicyclomine  Please try this as directed first, may be able to use as needed.  Please send a MyChart message back to [place] in a month.  Thank you for choosing me and [facility].  Iva Boop, M.D., [facility]

## 2012-08-31 ENCOUNTER — Ambulatory Visit: Payer: 59 | Admitting: Internal Medicine

## 2012-09-02 ENCOUNTER — Encounter: Payer: Self-pay | Admitting: Internal Medicine

## 2012-09-05 ENCOUNTER — Telehealth: Payer: Self-pay | Admitting: Internal Medicine

## 2012-09-05 DIAGNOSIS — R42 Dizziness and giddiness: Secondary | ICD-10-CM

## 2012-09-05 NOTE — Telephone Encounter (Signed)
Spoke with pt.  She is still having vertigo off and on and she would like to see an ENT.  Will set up with Dr. Pollyann Kennedy

## 2012-09-14 ENCOUNTER — Ambulatory Visit: Payer: 59 | Attending: Internal Medicine | Admitting: Physical Therapy

## 2012-09-14 DIAGNOSIS — M542 Cervicalgia: Secondary | ICD-10-CM | POA: Insufficient documentation

## 2012-09-14 DIAGNOSIS — IMO0001 Reserved for inherently not codable concepts without codable children: Secondary | ICD-10-CM | POA: Insufficient documentation

## 2012-09-19 ENCOUNTER — Encounter: Payer: Self-pay | Admitting: Sports Medicine

## 2012-09-19 ENCOUNTER — Ambulatory Visit (INDEPENDENT_AMBULATORY_CARE_PROVIDER_SITE_OTHER): Payer: 59 | Admitting: Sports Medicine

## 2012-09-19 ENCOUNTER — Ambulatory Visit (INDEPENDENT_AMBULATORY_CARE_PROVIDER_SITE_OTHER): Payer: 59

## 2012-09-19 VITALS — BP 143/83 | HR 92 | Wt 142.0 lb

## 2012-09-19 DIAGNOSIS — M79671 Pain in right foot: Secondary | ICD-10-CM

## 2012-09-19 DIAGNOSIS — M542 Cervicalgia: Secondary | ICD-10-CM

## 2012-09-19 DIAGNOSIS — W2209XA Striking against other stationary object, initial encounter: Secondary | ICD-10-CM

## 2012-09-19 DIAGNOSIS — M79609 Pain in unspecified limb: Secondary | ICD-10-CM

## 2012-09-19 NOTE — Assessment & Plan Note (Addendum)
Axial neck pain with associated paraspinal spasm. Exam is highly suggestive of a discogenic rather than a facetogenic process. Has failed conservative therapy including physical therapy, NSAIDs, to avoid injections. At this point we will pursue MRI for interventional injection planning. Return to see me to go over results.

## 2012-09-19 NOTE — Progress Notes (Signed)
SPORTS MEDICINE CONSULTATION REPORT  Subjective:    CC: Followup  HPI: Right foot pain: Approximately 1-1/2 weeks ago she banged her foot on the door. She had bruising, and pain in at the tip of the distal fifth metatarsal as well as fourth metatarsal. It was worse with ambulation, localized without radiation, severe. She never sought treatment.  Neck pain: Radiates down her paracervical muscles, but not into her hands or fingers. She has been through formal physical therapy, NSAIDs, x-rays were negative. Pain radiates as above, is moderate.  It is worse with flexion, and not worse with Valsalva.  Past medical history, Surgical history, Family history, Social history, Allergies, and medications have been entered into the medical record, reviewed, and no changes needed.   Review of Systems: No headache, visual changes, nausea, vomiting, diarrhea, constipation, dizziness, abdominal pain, skin rash, fevers, chills, night sweats, weight loss, swollen lymph nodes, body aches, joint swelling, muscle aches, chest pain, or shortness of breath.   Objective:   Vitals:  Afebrile, vital signs stable. General: Well Developed, well nourished, and in no acute distress.  Neuro/Psych: Alert and oriented x3, extra-ocular muscles intact, able to move all 4 extremities.  Skin: Warm and dry, no rashes noted.  Respiratory: Not using accessory muscles, speaking in full sentences, trachea midline.  Cardiovascular: Pulses palpable, no extremity edema. Abdomen: Does not appear distended. Neck: Inspection unremarkable. No palpable stepoffs. Negative Spurling's maneuver. Full neck range of motion Grip strength and sensation normal in bilateral hands Strength good C4 to T1 distribution No sensory change to C4 to T1 Negative Hoffman sign bilaterally Reflexes normal  Right foot: There is bruising over the distal metatarsophalangeal joints. She is tender to palpation distally at the fifth metatarsophalangeal  joint and somewhat over the shaft of the fourth metatarsal. There is reproduction of her pain with passive abduction of her fifth toe. She is neurovascularly intact distally. She is ambulatory.  X-rays of her foot show no sign of fracture, dislocation. She does have some haziness to the cortex of the fourth metatarsal bone which likely represents healing of her prior fourth metatarsal stress fracture.  Impression and Recommendations:   This case required medical decision making of moderate complexity.

## 2012-09-20 ENCOUNTER — Ambulatory Visit (HOSPITAL_BASED_OUTPATIENT_CLINIC_OR_DEPARTMENT_OTHER)
Admission: RE | Admit: 2012-09-20 | Discharge: 2012-09-20 | Disposition: A | Discharge status: Home / Self Care | Payer: 59 | Source: Ambulatory Visit | Attending: Sports Medicine | Admitting: Sports Medicine

## 2012-09-20 DIAGNOSIS — M47812 Spondylosis without myelopathy or radiculopathy, cervical region: Secondary | ICD-10-CM | POA: Insufficient documentation

## 2012-09-20 DIAGNOSIS — M542 Cervicalgia: Secondary | ICD-10-CM | POA: Insufficient documentation

## 2012-09-23 ENCOUNTER — Ambulatory Visit (INDEPENDENT_AMBULATORY_CARE_PROVIDER_SITE_OTHER): Payer: 59 | Admitting: Sports Medicine

## 2012-09-23 ENCOUNTER — Encounter: Payer: Self-pay | Admitting: Sports Medicine

## 2012-09-23 VITALS — BP 164/91 | HR 109 | Wt 140.0 lb

## 2012-09-23 DIAGNOSIS — M542 Cervicalgia: Secondary | ICD-10-CM

## 2012-09-23 DIAGNOSIS — M5416 Radiculopathy, lumbar region: Secondary | ICD-10-CM | POA: Insufficient documentation

## 2012-09-23 DIAGNOSIS — IMO0002 Reserved for concepts with insufficient information to code with codable children: Secondary | ICD-10-CM

## 2012-09-23 MED ORDER — CYCLOBENZAPRINE HCL 10 MG PO TABS: ORAL_TABLET | ORAL | Status: DC

## 2012-09-23 MED ORDER — TRAMADOL HCL 50 MG PO TABS: 50.0000 mg | ORAL_TABLET | Freq: Three times a day (TID) | ORAL | Status: DC | PRN

## 2012-09-23 NOTE — Assessment & Plan Note (Signed)
This patient seems to have had a good response to an epidural injection at the L3-L4 level.  This confirms that the exiting L3 nerve root is likely the cause of her radicular symptoms. She's not ready yet, but she would be a candidate for microdiscectomy at the L3-L4 level. She would prefer Dr. Venetia Maxon with neurosurgery.

## 2012-09-23 NOTE — Progress Notes (Signed)
SPORTS MEDICINE CONSULTATION REPORT  Subjective:    CC: Neck pain  HPI:  Neck pain: Nicole Crosby comes in again for followup of her cervical spine MRI. Unfortunately, the trigger point injection, formal physical therapy has not helped. She did get some improvement with the muscle relaxer. She also gets mild improvement with ibuprofen, but notes the meloxicam does not help. She wonders if cervical epidural steroid injections might be beneficial. The pain is again localized along the midline, and right paracervical muscles. She does not have any radicular symptoms, symptoms are severe. Terminal extension as well as prolonged flexion worsens pain.  Lumbar radiculitis: Patient has a long history of left-sided radicular back pain. She had an L3-L4 epidural injection that day for a good period of pain relief. She was told that it is not her back causing her symptoms, and never pursued this.  Past medical history, Surgical history, Family history, Social history, Allergies, and medications have been entered into the medical record, reviewed, and no changes needed.   Review of Systems: No headache, visual changes, nausea, vomiting, diarrhea, constipation, dizziness, abdominal pain, skin rash, fevers, chills, night sweats, weight loss, swollen lymph nodes, body aches, joint swelling, muscle aches, chest pain, or shortness of breath.   Objective:   Vitals:  Afebrile, vital signs stable. General: Well Developed, well nourished, and in no acute distress.  Neuro/Psych: Alert and oriented x3, extra-ocular muscles intact, able to move all 4 extremities.  Skin: Warm and dry, no rashes noted.  Respiratory: Not using accessory muscles, speaking in full sentences, trachea midline.  Cardiovascular: Pulses palpable, no extremity edema. Abdomen: Does not appear distended. Neck: Inspection unremarkable. No palpable stepoffs. Negative Spurling's maneuver. Full neck range of motion Grip strength and sensation normal in  bilateral hands Strength good C4 to T1 distribution No sensory change to C4 to T1 Negative Hoffman sign bilaterally Reflexes normal  MRI shows essentially normal-appearing spine with the exception of a very small right paracentral, and midline disc protrusion at the C6-C7 level. Facet joints look okay. There is no spinal stenosis.  Impression and Recommendations:   This case required medical decision making of moderate complexity.

## 2012-09-23 NOTE — Assessment & Plan Note (Signed)
MRI shows right-sided paracentral, as well as small midline disc protrusion at the C6-C7 level. It is unclear as to whether this could be the cause of her symptoms. And she has maximized conservative management, it certainly worth a try for interlaminar epidural steroid injection at the C6-C7 level. We will send her to [facility]  imaging, Dr. Karin Golden. -like add tramadol to her regimen.

## 2012-09-25 ENCOUNTER — Encounter: Payer: Self-pay | Admitting: Sports Medicine

## 2012-10-03 ENCOUNTER — Ambulatory Visit: Payer: 59 | Attending: Internal Medicine | Admitting: *Deleted

## 2012-10-03 ENCOUNTER — Other Ambulatory Visit: Payer: Self-pay | Admitting: Internal Medicine

## 2012-10-03 ENCOUNTER — Encounter: Payer: Self-pay | Admitting: Sports Medicine

## 2012-10-03 DIAGNOSIS — IMO0001 Reserved for inherently not codable concepts without codable children: Secondary | ICD-10-CM | POA: Insufficient documentation

## 2012-10-03 DIAGNOSIS — M542 Cervicalgia: Secondary | ICD-10-CM | POA: Insufficient documentation

## 2012-10-04 ENCOUNTER — Other Ambulatory Visit: Payer: Self-pay | Admitting: *Deleted

## 2012-10-04 ENCOUNTER — Other Ambulatory Visit: Payer: Self-pay | Admitting: Internal Medicine

## 2012-10-04 ENCOUNTER — Telehealth: Payer: Self-pay | Admitting: Sports Medicine

## 2012-10-04 DIAGNOSIS — M5416 Radiculopathy, lumbar region: Secondary | ICD-10-CM

## 2012-10-04 MED ORDER — ETONOGESTREL-ETHINYL ESTRADIOL 0.12-0.015 MG/24HR VA RING: VAGINAL_RING | VAGINAL | Status: DC

## 2012-10-04 NOTE — Telephone Encounter (Signed)
[facility] has not called her yet for her epidural injections!  Its been 2 weeks. Please call [facility] and see what their hold-up is.

## 2012-10-04 NOTE — Telephone Encounter (Signed)
Refill request

## 2012-10-05 ENCOUNTER — Ambulatory Visit: Payer: 59 | Admitting: *Deleted

## 2012-10-05 NOTE — Telephone Encounter (Signed)
Tammy, please check into this for me.  Thanks, [employer]

## 2012-10-10 ENCOUNTER — Encounter: Payer: 59 | Admitting: Physical Therapy

## 2012-10-10 ENCOUNTER — Ambulatory Visit: Payer: 59 | Admitting: Physical Therapy

## 2012-10-12 ENCOUNTER — Ambulatory Visit: Payer: 59 | Admitting: Rehabilitation

## 2012-10-17 ENCOUNTER — Ambulatory Visit: Payer: 59 | Admitting: Physical Therapy

## 2012-10-17 ENCOUNTER — Encounter: Payer: 59 | Admitting: Physical Therapy

## 2012-10-19 ENCOUNTER — Other Ambulatory Visit: Payer: Self-pay | Admitting: Dermatology

## 2012-10-20 ENCOUNTER — Other Ambulatory Visit: Payer: Self-pay | Admitting: Internal Medicine

## 2012-10-20 ENCOUNTER — Ambulatory Visit: Payer: 59 | Admitting: Physical Therapy

## 2012-10-24 ENCOUNTER — Encounter: Payer: 59 | Admitting: Physical Therapy

## 2012-10-31 ENCOUNTER — Ambulatory Visit
Admission: RE | Admit: 2012-10-31 | Discharge: 2012-10-31 | Disposition: A | Discharge status: Home / Self Care | Payer: 59 | Source: Ambulatory Visit | Attending: Sports Medicine | Admitting: Sports Medicine

## 2012-10-31 ENCOUNTER — Other Ambulatory Visit: Payer: Self-pay | Admitting: Sports Medicine

## 2012-10-31 VITALS — BP 137/74 | HR 70

## 2012-10-31 DIAGNOSIS — M542 Cervicalgia: Secondary | ICD-10-CM

## 2012-10-31 MED ORDER — IOHEXOL 300 MG/ML  SOLN
1.0000 mL | Freq: Once | INTRAMUSCULAR | Status: AC | PRN
  Administered 2012-10-31: 1 mL via EPIDURAL

## 2012-10-31 MED ORDER — TRIAMCINOLONE ACETONIDE 40 MG/ML IJ SUSP (RADIOLOGY)
60.0000 mg | Freq: Once | INTRAMUSCULAR | Status: AC
  Administered 2012-10-31: 60 mg via EPIDURAL

## 2013-02-07 ENCOUNTER — Other Ambulatory Visit: Payer: Self-pay | Admitting: Internal Medicine

## 2013-02-07 ENCOUNTER — Ambulatory Visit (INDEPENDENT_AMBULATORY_CARE_PROVIDER_SITE_OTHER): Payer: 59

## 2013-02-07 DIAGNOSIS — Z1231 Encounter for screening mammogram for malignant neoplasm of breast: Secondary | ICD-10-CM

## 2013-02-20 ENCOUNTER — Other Ambulatory Visit: Payer: Self-pay | Admitting: *Deleted

## 2013-02-21 ENCOUNTER — Telehealth: Payer: Self-pay | Admitting: Internal Medicine

## 2013-02-21 DIAGNOSIS — D3500 Benign neoplasm of unspecified adrenal gland: Secondary | ICD-10-CM

## 2013-02-21 MED ORDER — PANTOPRAZOLE SODIUM 40 MG PO TBEC: 40.0000 mg | DELAYED_RELEASE_TABLET | Freq: Every day | ORAL | Status: DC

## 2013-02-21 NOTE — Telephone Encounter (Signed)
Spoke with pt .  She wishes to try Protonix in place of nexium  Will change  Regarding adrenal adenoma  Pt would like to get endocrine opinion to see if any activity or not.  Will refer to Dr. Elvera Lennox

## 2013-03-10 ENCOUNTER — Ambulatory Visit: Payer: 59 | Admitting: Internal Medicine

## 2013-04-07 ENCOUNTER — Encounter: Payer: Self-pay | Admitting: Family Medicine

## 2013-04-07 ENCOUNTER — Ambulatory Visit (INDEPENDENT_AMBULATORY_CARE_PROVIDER_SITE_OTHER): Payer: 59 | Admitting: Family Medicine

## 2013-04-07 VITALS — BP 148/100 | HR 109 | Temp 98.3°F | Ht 61.0 in | Wt 140.8 lb

## 2013-04-07 DIAGNOSIS — E279 Disorder of adrenal gland, unspecified: Secondary | ICD-10-CM

## 2013-04-07 DIAGNOSIS — E278 Other specified disorders of adrenal gland: Secondary | ICD-10-CM

## 2013-04-07 DIAGNOSIS — R51 Headache: Secondary | ICD-10-CM

## 2013-04-07 DIAGNOSIS — I10 Essential (primary) hypertension: Secondary | ICD-10-CM

## 2013-04-07 LAB — LIPID PANEL
Cholesterol: 152 mg/dL (ref 0–200)
HDL: 49 mg/dL (ref >39)
LDL Cholesterol: 80 mg/dL (ref 0–99)
Total CHOL/HDL Ratio: 3.1 Ratio
Triglycerides: 114 mg/dL (ref <150)
VLDL: 23 mg/dL (ref 0–40)

## 2013-04-07 LAB — CBC WITH DIFFERENTIAL/PLATELET
Basophils Absolute: 0.1 10*3/uL (ref 0.0–0.1)
Basophils Relative: 1 % (ref 0–1)
Eosinophils Absolute: 0.1 10*3/uL (ref 0.0–0.7)
Eosinophils Relative: 1 % (ref 0–5)
HCT: 38.6 % (ref 36.0–46.0)
Hemoglobin: 12.9 g/dL (ref 12.0–15.0)
Lymphocytes Relative: 39 % (ref 12–46)
Lymphs Abs: 2.5 10*3/uL (ref 0.7–4.0)
MCH: 29.6 pg (ref 26.0–34.0)
MCHC: 33.4 g/dL (ref 30.0–36.0)
MCV: 88.5 fL (ref 78.0–100.0)
Monocytes Absolute: 0.5 10*3/uL (ref 0.1–1.0)
Monocytes Relative: 7 % (ref 3–12)
Neutro Abs: 3.3 10*3/uL (ref 1.7–7.7)
Neutrophils Relative %: 52 % (ref 43–77)
Platelets: 302 10*3/uL (ref 150–400)
RBC: 4.36 MIL/uL (ref 3.87–5.11)
RDW: 13 % (ref 11.5–15.5)
WBC: 6.3 10*3/uL (ref 4.0–10.5)

## 2013-04-07 LAB — HEPATIC FUNCTION PANEL
ALT: 14 U/L (ref 0–35)
AST: 16 U/L (ref 0–37)
Albumin: 4.3 g/dL (ref 3.5–5.2)
Alkaline Phosphatase: 50 U/L (ref 39–117)
Bilirubin, Direct: 0.1 mg/dL (ref 0.0–0.3)
Indirect Bilirubin: 0.1 mg/dL (ref 0.0–0.9)
Total Bilirubin: 0.2 mg/dL — ABNORMAL LOW (ref 0.3–1.2)
Total Protein: 6.8 g/dL (ref 6.0–8.3)

## 2013-04-07 LAB — BASIC METABOLIC PANEL
BUN: 7 mg/dL (ref 6–23)
CO2: 25 mEq/L (ref 19–32)
Calcium: 9.3 mg/dL (ref 8.4–10.5)
Chloride: 105 mEq/L (ref 96–112)
Creat: 0.67 mg/dL (ref 0.50–1.10)
Glucose, Bld: 98 mg/dL (ref 70–99)
Potassium: 3.9 mEq/L (ref 3.5–5.3)
Sodium: 137 mEq/L (ref 135–145)

## 2013-04-07 MED ORDER — LOSARTAN POTASSIUM-HCTZ 50-12.5 MG PO TABS: 1.0000 | ORAL_TABLET | Freq: Every day | ORAL | Status: DC

## 2013-04-07 NOTE — Progress Notes (Signed)
  Subjective:    Patient ID: Nicole Crosby, female    DOB: 05/01/1966, 47 y.o.   MRN: [id number]  HPI New to establish.  Previous MD- Schoenhoff  HTN- dx'd in October.  At time 'was in severe pain'.  Started on benicar.  Ran out of BP meds ~1 month ago.  Pt reports increased hand swelling and leg edema.  No CP w/ exception of GI sxs, SOB, + HAs, trace edema.  No visual changes.  HAs- pt noticed that 1 year ago was having visual changes, decreased hearing, dizziness.  Went to vestibular rehab- d/c'd after 2 visits.  Went to ENT noted to have some hearing loss at certain frequency.  Was referred to neuro but did not go.  Has awoken twice at night w/ pain and pressure behind R eye, twice during the day- eye will tear.  No nasal congestion.  No nausea/vomiting w/ headaches.  HAs will improve w/ tylenol/ibuprofen.  Dad w/ hx of cluster headaches.  HAs were occuring 1-2x/week, now less frequent.  Unrelated to HAs will have numbness and tingling of scalp.  R adrenal nodule- noted on CT x2, unchanged after 1 yr.  Was referred to Endo but cancelled appt.   Review of Systems     Objective:   Physical Exam  Vitals reviewed. Constitutional: She is oriented to person, place, and time. She appears well-developed and well-nourished. No distress.  HENT:  Head: Normocephalic and atraumatic.  Eyes: Conjunctivae and EOM are normal. Pupils are equal, round, and reactive to light.  Neck: Normal range of motion. Neck supple. No thyromegaly present.  Cardiovascular: Normal rate, regular rhythm, normal heart sounds and intact distal pulses.   No murmur heard. Pulmonary/Chest: Effort normal and breath sounds normal. No respiratory distress.  Abdominal: Soft. She exhibits no distension. There is no tenderness.  Musculoskeletal: She exhibits no edema.  Lymphadenopathy:    She has no cervical adenopathy.  Neurological: She is alert and oriented to person, place, and time. She has normal reflexes. No cranial  nerve deficit.  Skin: Skin is warm and dry.  Psychiatric: She has a normal mood and affect. Her behavior is normal.          Assessment & Plan:

## 2013-04-07 NOTE — Patient Instructions (Addendum)
Schedule your complete physical at your convenience We'll notify you of your lab results Someone will call you with your Neuro and Endo appts Start the Losartan-HCTZ daily Consider starting a med for depression- this will help IBS, menopausal symptoms, etc Call with any questions or concerns Welcome!  We're glad to have you!!!

## 2013-04-08 LAB — TSH: TSH: 0.743 u[IU]/mL (ref 0.350–4.500)

## 2013-04-14 ENCOUNTER — Encounter: Payer: Self-pay | Admitting: Family Medicine

## 2013-04-30 NOTE — Assessment & Plan Note (Signed)
New to provider.  Pt has not had Endo f/u or repeat CT as recommended.  Again, suggested referral.

## 2013-04-30 NOTE — Assessment & Plan Note (Signed)
New to provider, ongoing for pt.  Has some features of cluster HA.  Agree w/ previous neuro referral.  Discussed reasoning for this w/ pt.  States she is agreeable to referral.

## 2013-04-30 NOTE — Assessment & Plan Note (Signed)
New to provider, ongoing for pt.  Out of meds.  BP remains elevated despite her initial thoughts that this was only b/c she was in pain.  Restart BP meds.  Will follow closely.

## 2013-05-11 ENCOUNTER — Ambulatory Visit (INDEPENDENT_AMBULATORY_CARE_PROVIDER_SITE_OTHER): Payer: 59 | Admitting: Internal Medicine

## 2013-05-11 ENCOUNTER — Ambulatory Visit: Payer: 59 | Admitting: Neurology

## 2013-05-11 ENCOUNTER — Encounter: Payer: Self-pay | Admitting: Internal Medicine

## 2013-05-11 VITALS — BP 124/74 | HR 111 | Temp 98.8°F | Resp 12 | Ht 61.0 in | Wt 141.0 lb

## 2013-05-11 DIAGNOSIS — D3501 Benign neoplasm of right adrenal gland: Secondary | ICD-10-CM

## 2013-05-11 DIAGNOSIS — D3500 Benign neoplasm of unspecified adrenal gland: Secondary | ICD-10-CM

## 2013-05-11 MED ORDER — DEXAMETHASONE 0.5 MG PO TABS: 1.0000 mg | ORAL_TABLET | Freq: Two times a day (BID) | ORAL | Status: DC

## 2013-05-11 NOTE — Progress Notes (Addendum)
Subjective:     Patient ID: Nicole Crosby, female   DOB: Mar 27, 1966, 47 y.o.   MRN: [id number]  HPI Nicole Crosby is a pleasant 47 year old woman, referred by her PCP, Dr.Tabori, for evaluation and management of a right adrenal nodule. Patient recently established care with Dr. Beverely Low.  The adrenal nodule was first discovered on a CT obtained in 07/26/2008. At that time, it was commented that the adrenals look "normal", however, after a subsequent CT scan obtained on 02/18/2012 showed a 1.3 cm right adrenal nodule, a review of the previous CT scan images shown that the right nodule was preexistent, and unchanged. The Hounsfield units were not calculated. Also, the contrast washout percentage was not calculated. He was mentioned, however, on the report from last year, that the nodule looked benign. She is a Armed forces operational officer, and she did discuss with the radiologist about the images, and he recommended to get a dedicated adrenal CT with contrast.  Patient describes that she was recently diagnosed with hypertension, less than a year ago, was initially on Benicar, and recently changed to Hyzaar. Her blood pressure after she ran out of medication for a month, at her previous visit with her PCP, was 148/100, with a pulse of 108. She has family history of hypertension in mother and father.  She also has a history of headaches, sometimes perceived as pressure behind the right eye, without associated nausea, vomiting. Her father had history of cluster headaches. No history of flushing or pallor with the headaches.  Spx: - night sweats - swelling/tenderness under left collarbone - had this in 2012 along with a high d-dimer >> Ct angio chest negative for PE - 8-10 abdominal weight gain - started to have to shave legs more often - hair grows faster - recent onset HTN 08/2012 - she initially thought it was ~ on back pain - palpitations 2 years ago >> Holter monitor >> PACs; She also had atypical chest pain  and palpitations in 2010, at that time a stress 2-D echo was normal; now palpitations only appear when weight lifting - HA started this year, <1-2 episode a week, from back of neck - has IBS and abdominal pain, better after cutting out dairy - has NuvaRing so withdrawal bleeding every month - no stretch marks on abdomen - increased shoe size by half a size since last year - chronic let hip pain - h/o stress fx right foot last year - no increased in sweating - rash on legs, thinks from tanning lotion  Reviewing patient's chart, she also has a history of GERD, hiatal hernia, insomnia.  Review of Systems Constitutional: + weight gain, no fatigue, no subjective hyperthermia/hypothermia; + poor sleep Eyes: no blurry vision, no xerophthalmia ENT: no sore throat, no nodules palpated in throat, no dysphagia/odynophagia, no hoarseness; + decreased hearing Cardiovascular: no CP/SOB/+ palpitations/+ leg swelling Respiratory: no cough/SOB Gastrointestinal: no N/V/D/C Musculoskeletal: no muscle/+ joint aches: finger, foot, clavicle Skin: no rashes; excessive hair growth Neurological: no tremors/numbness/tingling/dizziness; + HA Psychiatric: no depression/anxiety Low libido  Past Medical History  Diagnosis Date  . IBS (irritable bowel syndrome)   . Insomnia   . Other abnormal glucose     Tiny hypodensity left hepatic lobe-CT of abdomen and pelvis 07/26/2008-small 8mm periumblical hernia  . PAC (premature atrial contraction)   . Hyperlipidemia   . GERD (gastroesophageal reflux disease)   . Hiatal hernia   . Hypertension     pt denies  . Palpitations   . Functional dyspepsia   .  Chicken pox    Past Surgical History  Procedure Laterality Date  . De quervain's release  2000    Right  . Cesarean section  01/2001   History   Social History  . Marital Status: Married    Spouse Name: N/A    Number of Children: 2: 58 and 22 y/o  . Years of Education: N/A   Occupational History  .  Xray/[employer]   Social History Main Topics  . Smoking status: Never Smoker   . Smokeless tobacco: Never Used  . Alcohol Use: 0.6 oz/week    1 Glasses of wine per week     Comment: occasional  . Drug Use: No  . Sexually Active: Yes    Birth Control/ Protection: Inserts   Other Topics Concern  . 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    Alcohol Use - yes-wine         Full Time    Married           Current Outpatient Prescriptions on File Prior to Visit  Medication Sig Dispense Refill  . calcium carbonate (OS-CAL) 600 MG TABS Take 600 mg by mouth daily.      . clidinium-chlordiazePOXIDE (LIBRAX) 2.5-5 MG per capsule Take 1 capsule by mouth 3 (three) times daily as needed.  60 capsule  0  . cyclobenzaprine (FLEXERIL) 10 MG tablet One tab TID.  60 tablet  1  . dicyclomine (BENTYL) 20 MG tablet Take 1 tablet (20 mg total) by mouth 3 (three) times daily before meals.  90 tablet  0  . etonogestrel-ethinyl estradiol (NUVARING) 0.12-0.015 MG/24HR vaginal ring Insert vaginally and leave in place for 3 consecutive weeks, then remove for 1 week.  1 each  11  . losartan-hydrochlorothiazide (HYZAAR) 50-12.5 MG per tablet Take 1 tablet by mouth daily.  90 tablet  3  . Melatonin 3 MG CAPS Take 1 capsule by mouth as needed.        . Multiple Vitamin (MULTIVITAMIN) tablet Take 1 tablet by mouth daily.        . pantoprazole (PROTONIX) 40 MG tablet Take 1 tablet (40 mg total) by mouth daily.  30 tablet  3  . traMADol (ULTRAM) 50 MG tablet Take 1 tablet (50 mg total) by mouth every 8 (eight) hours as needed for pain.  50 tablet  2   No current facility-administered medications on file prior to visit.   Allergies  Allergen Reactions  . Buspirone Other (See Comments)    Headache, nightmare  . Sulfonamide Derivatives Rash     Rash    Family History  Problem Relation Age of Onset  . Hypertension Mother   .  Hyperlipidemia Mother   . Fibrocystic breast disease Mother   . Hearing loss Mother   . Osteopenia Mother   . Prostate cancer Father 16    deceased  . Hypertension Father   . Diabetes Father   . Heart disease Father     MI  . Kidney disease Father   . Other Brother     unknown  . Thyroid disease Sister   . Diabetes Sister     pre-diabetes  . Hypertension Sister   . Arthritis Sister   . Diabetes Sister   . Hypertension Sister   . Hyperlipidemia Sister   . Fibrocystic breast disease Sister   . Colon cancer Maternal Uncle  died in late 49's  . Diabetes Maternal Grandmother   . Prostate cancer Maternal Grandfather   . COPD Maternal Grandfather    Objective:   Physical Exam BP 124/74  Pulse 111  Temp(Src) 98.8 F (37.1 C) (Oral)  Resp 12  Ht 5\' 1"  (1.549 m)  Wt 141 lb (63.957 kg)  BMI 26.66 kg/m2  SpO2 99%  LMP 04/28/2013 Wt Readings from Last 3 Encounters:  05/11/13 141 lb (63.957 kg)  04/07/13 140 lb 12.8 oz (63.866 kg)  09/23/12 140 lb (63.504 kg)   Constitutional: overweight, in NAD Eyes: PERRLA, EOMI, no exophthalmos ENT: moist mucous membranes, no thyromegaly, no cervical lymphadenopathy Cardiovascular: RRR, No MRG Respiratory: CTA B Gastrointestinal: abdomen soft, NT, ND, BS+ Musculoskeletal: no deformities, strength intact in all 4 Skin: moist, warm, no rashes Neurological: no tremor with outstretched hands, DTR normal in all 4     Assessment:     1. Right adrenal nodule - stable in size from CT abdomen in 07/26/2008 to 02/18/2012: 1.3 cm - no dedicated adrenal CT checked     Plan:     Patient with a right adrenal nodule discovered incidentally in 2009, stable in size and appearance from on a CT scan in 2013. The studies were not dedicated adrenal CT scan, but I discussed with the patient that the fact that the nodule did not change in size or appearance is encouraging.   We discussed about the fact that there are 3 possible scenarios: - A  nonfunctioning adrenal nodule - A functioning adrenal adenoma - which can hypersecrete either catecholamines, cortisol, or aldosterone - Adrenal cancer The fact that the nodule did not change in size in 4 years would indicate that it is very unlikely that her nodule is cancerous.  To differentiate between a functioning and a nonfunctioning adrenal nodule, we'll need to rule out hypersecretion by checking the following tests  - dexamethasone suppression test to rule out Cushing syndrome - Plasma fractionated metanephrines and catecholamines to rule out pheochromocytoma (of note, she is not taking the listed Flexeril, which would interfere with the measurement) - Plasma renin activity and aldosterone level to rule out primary hyperaldosteronism I ordered the above tests today. I advised her to take the dexamethasone tablets (1 mg total dose, sent to her pharmacy) at 11 PM the night before coming to the lab to have a cortisol level drawn. I also added dexamethasone level. - We discussed about the need for an other CT scan, and I believe that we can wait for another year and then have her back for repeat hormonal testing and ordering a dedicated adrenal CT. I will need to order it with and without contrast, if the Hounsfield units are low and the lesion again appears benign, we might not need to get the contrasted CT for washout.  - I explained all the above to the patient, and she agrees with the plan.   - we also discussed that she is perimenopausal and many of her spx can be explained by this - RTC in 1 year if all tests normal    Chemistry      Component Value Date/Time   NA 138 05/15/2013 0920   K 4.5 05/15/2013 0920   CL 103 05/15/2013 0920   CO2 27 05/15/2013 0920   BUN 12 05/15/2013 0920   CREATININE 0.71 05/15/2013 0920      Component Value Date/Time   CALCIUM 9.2 05/15/2013 0920  Glu 94     Component  Latest Ref Rng 05/15/2013 05/22/2013 05/26/2013  Epinephrine      27     Norepinephrine      436    Dopamine      REPORT    Catecholamines, Total      463    Metanephrine, Free     <=57 pg/mL 25    Normetanephrine, Free     <=148 pg/mL 85    Total Metanephrines-Plasma     <=205 pg/mL 110    Dexamethasone, Serum       689   Cortisol, Plasma       20.2 14.4  Tests negative for pheochromocytoma, but unfortunately inconclusive for Cushing's >> both cortisol and Dexamethasone level post Dex 1 mg at 11 pm are high, most likely 2/2 increased Cortisol-binding globulin by her NuvaRing estrogen. Will perform a urine 24h collection for cortisol instead, but this might be slightly high in pts on estrogen, too. Will check if PRA + Aldo were drawn, as they were not resulted.   Dear Nicole Crosby, The rest of the labs (Plasma renin activity) and aldosterone level were not resulted to me but we called the lab and they faxed me the results. No increased aldosterone, which is great! We do not need to repeat the test. PRA 12.98 (0.25-5.82) - this is a little high, but it varies a lot and depends on the diet Aldosterone: 9 (<28) Please only come for the urine container and urine hat. Sincerely, Carlus Pavlov MD  06/13/2013: Component     Latest Ref Rng 06/07/2013  Cortisol (Ur), Free     4.0 - 50.0 mcg/24 h 50.1 (H)  Results received     0.63 - 2.50 g/24 h 1.23  Creatinine, Urine      66.4  Creatinine, 24H Ur     700 - 1800 mg/day 1328   Dear Nicole Crosby,  The cortisol test is finally back, and is at the upper limit of normal. The urinary cortisol can also be increased by estrogen, although not as much as the blood cortisol.  This is reassuring that the abnormal dexamethasone suppression test that you had before, is actually due to you being on estrogen.  I suggest that we repeat the test in a year, but for now I would not suggest further evaluation.  Please let me know if you have any questions.  Sincerely,  Carlus Pavlov MD

## 2013-05-11 NOTE — Patient Instructions (Addendum)
Please return in a year. We will recheck the hormonal tests and most likely order a CT scan of your adrenals at that time.

## 2013-05-12 ENCOUNTER — Encounter: Payer: Self-pay | Admitting: Internal Medicine

## 2013-05-15 ENCOUNTER — Other Ambulatory Visit: Payer: Self-pay | Admitting: Internal Medicine

## 2013-05-16 LAB — BASIC METABOLIC PANEL
BUN: 12 mg/dL (ref 6–23)
CO2: 27 mEq/L (ref 19–32)
Calcium: 9.2 mg/dL (ref 8.4–10.5)
Chloride: 103 mEq/L (ref 96–112)
Creat: 0.71 mg/dL (ref 0.50–1.10)
Glucose, Bld: 94 mg/dL (ref 70–99)
Potassium: 4.5 mEq/L (ref 3.5–5.3)
Sodium: 138 mEq/L (ref 135–145)

## 2013-05-18 LAB — METANEPHRINES, PLASMA
Metanephrine, Free: 25 pg/mL (ref <=57)
Normetanephrine, Free: 85 pg/mL (ref <=148)
Total Metanephrines-Plasma: 110 pg/mL (ref <=205)

## 2013-05-19 LAB — CATECHOLAMINES, FRACTIONATED, PLASMA
Catecholamines, Total: 463 pg/mL
Epinephrine: 27 pg/mL
Norepinephrine: 436 pg/mL

## 2013-05-22 ENCOUNTER — Other Ambulatory Visit: Payer: Self-pay | Admitting: Internal Medicine

## 2013-05-23 ENCOUNTER — Encounter: Payer: Self-pay | Admitting: Internal Medicine

## 2013-05-23 LAB — CORTISOL: Cortisol, Plasma: 20.2 ug/dL

## 2013-05-25 ENCOUNTER — Other Ambulatory Visit: Payer: Self-pay | Admitting: Internal Medicine

## 2013-05-25 DIAGNOSIS — D3501 Benign neoplasm of right adrenal gland: Secondary | ICD-10-CM

## 2013-05-25 DIAGNOSIS — R7309 Other abnormal glucose: Secondary | ICD-10-CM

## 2013-05-25 MED ORDER — DEXAMETHASONE 1 MG PO TABS: 1.0000 mg | ORAL_TABLET | Freq: Once | ORAL | Status: DC

## 2013-05-25 NOTE — Addendum Note (Signed)
Addended by: Carlus Pavlov on: 05/25/2013 09:23 AM   Modules accepted: Orders

## 2013-05-26 ENCOUNTER — Other Ambulatory Visit: Payer: Self-pay | Admitting: Internal Medicine

## 2013-05-26 DIAGNOSIS — E278 Other specified disorders of adrenal gland: Secondary | ICD-10-CM

## 2013-05-26 LAB — CORTISOL: Cortisol, Plasma: 14.4 ug/dL

## 2013-05-27 LAB — DEXAMETHASONE, BLOOD: Dexamethasone, Serum: 689 ng/dL

## 2013-05-28 ENCOUNTER — Encounter: Payer: Self-pay | Admitting: Internal Medicine

## 2013-05-28 NOTE — Addendum Note (Signed)
Addended by: Carlus Pavlov on: 05/28/2013 07:09 AM   Modules accepted: Orders

## 2013-05-29 ENCOUNTER — Encounter: Payer: Self-pay | Admitting: Internal Medicine

## 2013-05-31 ENCOUNTER — Ambulatory Visit (INDEPENDENT_AMBULATORY_CARE_PROVIDER_SITE_OTHER): Payer: 59 | Admitting: Family Medicine

## 2013-05-31 ENCOUNTER — Encounter: Payer: Self-pay | Admitting: Family Medicine

## 2013-05-31 ENCOUNTER — Other Ambulatory Visit (HOSPITAL_COMMUNITY)
Admission: RE | Admit: 2013-05-31 | Discharge: 2013-05-31 | Disposition: A | Discharge status: Home / Self Care | Payer: 59 | Source: Ambulatory Visit | Attending: Family Medicine | Admitting: Family Medicine

## 2013-05-31 VITALS — BP 130/80 | HR 92 | Temp 98.6°F | Ht 61.0 in | Wt 139.8 lb

## 2013-05-31 DIAGNOSIS — N949 Unspecified condition associated with female genital organs and menstrual cycle: Secondary | ICD-10-CM

## 2013-05-31 DIAGNOSIS — D3501 Benign neoplasm of right adrenal gland: Secondary | ICD-10-CM

## 2013-05-31 DIAGNOSIS — R102 Pelvic and perineal pain unspecified side: Secondary | ICD-10-CM | POA: Insufficient documentation

## 2013-05-31 DIAGNOSIS — Z0000 Encounter for general adult medical examination without abnormal findings: Secondary | ICD-10-CM | POA: Insufficient documentation

## 2013-05-31 DIAGNOSIS — E278 Other specified disorders of adrenal gland: Secondary | ICD-10-CM

## 2013-05-31 DIAGNOSIS — D3500 Benign neoplasm of unspecified adrenal gland: Secondary | ICD-10-CM

## 2013-05-31 DIAGNOSIS — Z1151 Encounter for screening for human papillomavirus (HPV): Secondary | ICD-10-CM | POA: Insufficient documentation

## 2013-05-31 DIAGNOSIS — Z01419 Encounter for gynecological examination (general) (routine) without abnormal findings: Secondary | ICD-10-CM | POA: Insufficient documentation

## 2013-05-31 DIAGNOSIS — E279 Disorder of adrenal gland, unspecified: Secondary | ICD-10-CM

## 2013-05-31 DIAGNOSIS — R222 Localized swelling, mass and lump, trunk: Secondary | ICD-10-CM | POA: Insufficient documentation

## 2013-05-31 DIAGNOSIS — Z124 Encounter for screening for malignant neoplasm of cervix: Secondary | ICD-10-CM

## 2013-05-31 DIAGNOSIS — R195 Other fecal abnormalities: Secondary | ICD-10-CM

## 2013-05-31 NOTE — Progress Notes (Signed)
  Subjective:    Patient ID: Nicole Crosby, female    DOB: Apr 25, 1966, 47 y.o.   MRN: [id number]  HPI CPE- UTD on mammo.  Requesting pap today due to R pelvic pain.  Reports 'flutters' similar to 'when a baby starts kicking'.  Denies vaginal d/c, abnormal bleeding.  sxs started in May.  sxs are intermittent.  No longer having regular periods.  + abd pain w/ dark stools.  Pt describes stools as black in appearance, tarry/sticky.  No blood.   Review of Systems Patient reports no vision/ hearing changes, adenopathy,fever, weight change,  persistant/recurrent hoarseness , swallowing issues, chest pain, palpitations, edema, persistant/recurrent cough, hemoptysis, dyspnea (rest/exertional/paroxysmal nocturnal), gastrointestinal bleeding (melena, rectal bleeding), GU symptoms (dysuria, hematuria, incontinence), Gyn symptoms (abnormal  bleeding, pain),  syncope, focal weakness, memory loss, numbness & tingling, skin/hair/nail changes, abnormal bruising or bleeding, anxiety, or depression.   + pain and swelling under L clavicle + GERD    Objective:   Physical Exam  General Appearance:    Alert, cooperative, no distress, appears stated age  Head:    Normocephalic, without obvious abnormality, atraumatic  Eyes:    PERRL, conjunctiva/corneas clear, EOM's intact, fundi    benign, both eyes  Ears:    Normal TM's and external ear canals, both ears  Nose:   Nares normal, septum midline, mucosa normal, no drainage    or sinus tenderness  Throat:   Lips, mucosa, and tongue normal; teeth and gums normal  Neck:   Supple, symmetrical, trachea midline, no adenopathy;    Thyroid: no enlargement/tenderness/nodules  Back:     Symmetric, no curvature, ROM normal, no CVA tenderness  Lungs:     Clear to auscultation bilaterally, respirations unlabored  Chest Wall:    Fullness and tenderness in L supraclavicular fossa   Heart:    Regular rate and rhythm, S1 and S2 normal, no murmur, rub   or gallop  Breast  Exam:    No tenderness, masses, or nipple abnormality  Abdomen:     Soft, non-tender, bowel sounds active all four quadrants,    no masses, no organomegaly  Genitalia:    External genitalia normal, cervix normal in appearance, no CMT, uterus in normal size and position, adnexa w/out mass or tenderness, mucosa pink and moist, no lesions or discharge present  Rectal:    Normal external appearance, heme (-) stool  Extremities:   Extremities normal, atraumatic, no cyanosis or edema  Pulses:   2+ and symmetric all extremities  Skin:   Skin color, texture, turgor normal, no rashes or lesions  Lymph nodes:   Cervical, supraclavicular, and axillary nodes normal  Neurologic:   CNII-XII intact, normal strength, sensation and reflexes    throughout          Assessment & Plan:

## 2013-05-31 NOTE — Progress Notes (Signed)
Pt did not want cortisol drawn , it was another Doctor order.                            Nikia Levels T.

## 2013-05-31 NOTE — Patient Instructions (Addendum)
We'll notify you of your lab results and pap Someone will call you with your [place] appt If you continue to have RLQ abd/pelvic pain, call GI If you continue to have dark, tarry or bloody stools- call GI (no evidence of blood in office today) Call with any questions or concerns Enjoy the rest of summer!

## 2013-06-01 ENCOUNTER — Encounter: Payer: Self-pay | Admitting: Family Medicine

## 2013-06-01 ENCOUNTER — Ambulatory Visit (HOSPITAL_BASED_OUTPATIENT_CLINIC_OR_DEPARTMENT_OTHER)
Admission: RE | Admit: 2013-06-01 | Discharge: 2013-06-01 | Disposition: A | Discharge status: Home / Self Care | Payer: 59 | Source: Ambulatory Visit | Attending: Family Medicine | Admitting: Family Medicine

## 2013-06-01 ENCOUNTER — Other Ambulatory Visit: Payer: Self-pay | Admitting: Family Medicine

## 2013-06-01 DIAGNOSIS — R222 Localized swelling, mass and lump, trunk: Secondary | ICD-10-CM

## 2013-06-01 DIAGNOSIS — R220 Localized swelling, mass and lump, head: Secondary | ICD-10-CM | POA: Insufficient documentation

## 2013-06-01 MED ORDER — PANTOPRAZOLE SODIUM 40 MG PO TBEC: 40.0000 mg | DELAYED_RELEASE_TABLET | Freq: Every day | ORAL | Status: DC

## 2013-06-01 MED ORDER — ETONOGESTREL-ETHINYL ESTRADIOL 0.12-0.015 MG/24HR VA RING: VAGINAL_RING | VAGINAL | Status: DC

## 2013-06-01 NOTE — Telephone Encounter (Signed)
Rx's sent to the pharmacy by e-script.//AB/CMA 

## 2013-06-04 NOTE — Assessment & Plan Note (Signed)
New.  No obvious cause on pelvic exam.  If sxs persist, will need complete GI w/u.  Pt aware and will f/u prn.

## 2013-06-04 NOTE — Assessment & Plan Note (Signed)
Pap collected. 

## 2013-06-04 NOTE — Assessment & Plan Note (Signed)
New.  Get US to assess. 

## 2013-06-04 NOTE — Assessment & Plan Note (Signed)
New.  Heme negative in office.  Pt to f/u w/ GI if sxs persist.

## 2013-06-04 NOTE — Assessment & Plan Note (Signed)
Pt's PE WNL w/ exception of supraclavicular fossa fullness on L.  Check labs.  Anticipatory guidance provided.

## 2013-06-05 LAB — VITAMIN D 1,25 DIHYDROXY
Vitamin D 1, 25 (OH)2 Total: 23 pg/mL (ref 18–72)
Vitamin D2 1, 25 (OH)2: <8 pg/mL
Vitamin D3 1, 25 (OH)2: 23 pg/mL

## 2013-06-07 NOTE — Addendum Note (Signed)
Addended by: Silvio Pate D on: 06/07/2013 03:31 PM   Modules accepted: Orders

## 2013-06-08 LAB — CREATININE, URINE, 24 HOUR
Creatinine, 24H Ur: 1328 mg/d (ref 700–1800)
Creatinine, Urine: 66.4 mg/dL

## 2013-06-10 ENCOUNTER — Encounter: Payer: Self-pay | Admitting: Internal Medicine

## 2013-06-12 LAB — CORTISOL, URINE, 24 HOUR
Cortisol (Ur), Free: 50.1 mcg/24 h — ABNORMAL HIGH (ref 4.0–50.0)
RESULTS RECEIVED: 1.23 g/(24.h) (ref 0.63–2.50)

## 2013-09-07 ENCOUNTER — Other Ambulatory Visit: Payer: Self-pay

## 2013-10-13 ENCOUNTER — Ambulatory Visit (INDEPENDENT_AMBULATORY_CARE_PROVIDER_SITE_OTHER): Payer: 59 | Admitting: Family Medicine

## 2013-10-13 ENCOUNTER — Encounter: Payer: Self-pay | Admitting: Family Medicine

## 2013-10-13 VITALS — BP 120/84 | HR 101 | Temp 98.1°F | Resp 16 | Wt 141.4 lb

## 2013-10-13 DIAGNOSIS — J320 Chronic maxillary sinusitis: Secondary | ICD-10-CM | POA: Insufficient documentation

## 2013-10-13 MED ORDER — FLUTICASONE PROPIONATE 50 MCG/ACT NA SUSP: 2.0000 | Freq: Every day | NASAL | Status: DC

## 2013-10-13 MED ORDER — AMOXICILLIN 875 MG PO TABS: 875.0000 mg | ORAL_TABLET | Freq: Two times a day (BID) | ORAL | Status: DC

## 2013-10-13 NOTE — Patient Instructions (Signed)
Follow up as needed Start the Amoxicillin twice daily- take w/ food Drink plenty of fluids Use the nasal spray- 2 sprays each nostril daily- to decreased congestion and facilitate drainage REST! Call if no improvement or worsening Call with any questions or concerns Hang in there! Happy Holidays!

## 2013-10-13 NOTE — Progress Notes (Signed)
   Subjective:    Patient ID: Nicole Crosby, female    DOB: Jun 10, 1966, 47 y.o.   MRN: [id number]  HPI HA- daily HA.  Woke in the middle of the night w/ HA.  Has been seeing Dr Craige Cotta (neuro)- was told she was having migraines due to lack of sleep.  Was started on Amitriptyline and told to follow up (has appt scheduled 12/22).  Pt reports no problems sleeping- instead she is having increased fatigue.  Has not been using the meds.  Initially attributed the HA to Protonix.  Stopped Protonix but HAs continued.  Now having facial pain, R ear pain, blurry vision on R.  Some dizziness.  Pain started 'a couple of weeks ago'.  No fever.  + tooth pain- has seen dentist.   Review of Systems For ROS see HPI     Objective:   Physical Exam  Vitals reviewed. Constitutional: She is oriented to person, place, and time. She appears well-developed and well-nourished. No distress.  HENT:  Head: Normocephalic and atraumatic.  Right Ear: Tympanic membrane normal.  Left Ear: Tympanic membrane normal.  Nose: Mucosal edema and rhinorrhea present. Right sinus exhibits maxillary sinus tenderness. Right sinus exhibits no frontal sinus tenderness. Left sinus exhibits maxillary sinus tenderness. Left sinus exhibits no frontal sinus tenderness.  Mouth/Throat: Uvula is midline and mucous membranes are normal. Posterior oropharyngeal erythema present. No oropharyngeal exudate.  Eyes: Conjunctivae and EOM are normal. Pupils are equal, round, and reactive to light.  Neck: Normal range of motion. Neck supple.  Cardiovascular: Normal rate, regular rhythm and normal heart sounds.   Pulmonary/Chest: Effort normal and breath sounds normal. No respiratory distress. She has no wheezes.  Lymphadenopathy:    She has no cervical adenopathy.  Neurological: She is alert and oriented to person, place, and time. No cranial nerve deficit. Coordination normal.  Skin: Skin is warm and dry.  Psychiatric: She has a normal mood and affect.  Her behavior is normal. Thought content normal.          Assessment & Plan:

## 2013-10-13 NOTE — Progress Notes (Signed)
Pre visit review using our clinic review tool, if applicable. No additional management support is needed unless otherwise documented below in the visit note. 

## 2013-10-15 NOTE — Assessment & Plan Note (Addendum)
New.  Given facial pain, tooth pain, daily HA, dizziness- will tx as maxillary sinusitis.  Start abx.  Nasal steroid to decrease congestion, pressure and HA.  If no improvement, pt to call.  Also has neuro f/u scheduled due to hx of migraine.  Reviewed supportive care and red flags that should prompt return.  Pt expressed understanding and is in agreement w/ plan.

## 2013-11-03 ENCOUNTER — Other Ambulatory Visit: Payer: Self-pay | Admitting: Internal Medicine

## 2013-11-05 ENCOUNTER — Encounter: Payer: Self-pay | Admitting: Family Medicine

## 2013-11-06 MED ORDER — LOSARTAN POTASSIUM 50 MG PO TABS: 50.0000 mg | ORAL_TABLET | Freq: Every day | ORAL | Status: DC

## 2013-11-06 MED ORDER — OMEPRAZOLE 40 MG PO CPDR: 40.0000 mg | DELAYED_RELEASE_CAPSULE | Freq: Every day | ORAL | Status: DC

## 2013-11-06 NOTE — Telephone Encounter (Signed)
New meds were sent.  Please schedule pt for 1 month BP f/u to make sure the new med is working (w/out the HCTZ)

## 2013-11-06 NOTE — Telephone Encounter (Signed)
Please advise. SW 

## 2013-11-22 ENCOUNTER — Other Ambulatory Visit: Payer: Self-pay | Admitting: Dermatology

## 2014-01-11 ENCOUNTER — Encounter: Payer: Self-pay | Admitting: Family Medicine

## 2014-01-11 ENCOUNTER — Ambulatory Visit: Payer: 59 | Admitting: Family Medicine

## 2014-01-11 ENCOUNTER — Ambulatory Visit (INDEPENDENT_AMBULATORY_CARE_PROVIDER_SITE_OTHER): Payer: 59 | Admitting: Family Medicine

## 2014-01-11 VITALS — BP 126/84 | HR 96 | Temp 98.7°F | Resp 16 | Wt 141.1 lb

## 2014-01-11 DIAGNOSIS — R0789 Other chest pain: Secondary | ICD-10-CM

## 2014-01-11 DIAGNOSIS — I10 Essential (primary) hypertension: Secondary | ICD-10-CM

## 2014-01-11 DIAGNOSIS — R5383 Other fatigue: Secondary | ICD-10-CM

## 2014-01-11 DIAGNOSIS — R5381 Other malaise: Secondary | ICD-10-CM | POA: Insufficient documentation

## 2014-01-11 MED ORDER — LOSARTAN POTASSIUM-HCTZ 50-12.5 MG PO TABS: 1.0000 | ORAL_TABLET | Freq: Every day | ORAL | Status: DC

## 2014-01-11 NOTE — Patient Instructions (Signed)
Follow up in 4-6 weeks to recheck BP Start the Losartan HCTZ combo pill We'll notify you of your lab results and make any changes if needed We'll call you with your cardiology appt Call with any questions or concerns Hang in there!!!

## 2014-01-11 NOTE — Progress Notes (Signed)
   Subjective:    Patient ID: Nicole Crosby, female    DOB: May 01, 1966, 48 y.o.   MRN: [id number]  HPI HTN- medication was changed this summer from Losartan HCTZ to plain Losartan due to excessive urination at night.  Pt feels that BP is not as well controlled w/o HCTZ as it was on it.  Home readings vary from 113-143/68-85.  CP- 'since Jan'.  Intermittent.  Previously was not occuring w/ exercise but did last night while on treadmill.  Described as a 'pressure.  A squeezing'.  sxs will last 20-30 min and resolve spontaneously.  No relation to food, stress, exertion.  + family hx of CAD (father).  No GERD.  No associated SOB.  Fatigue- pt reports increased fatigue, feeling of cold all the time despite sweating at night.  Still having periods- becoming irregular.  Review of Systems For ROS see HPI     Objective:   Physical Exam  Vitals reviewed. Constitutional: She is oriented to person, place, and time. She appears well-developed and well-nourished. No distress.  HENT:  Head: Normocephalic and atraumatic.  Eyes: Conjunctivae and EOM are normal. Pupils are equal, round, and reactive to light.  Neck: Normal range of motion. Neck supple. No thyromegaly present.  Cardiovascular: Normal rate, regular rhythm, normal heart sounds and intact distal pulses.   No murmur heard. Pulmonary/Chest: Effort normal and breath sounds normal. No respiratory distress.  Abdominal: Soft. She exhibits no distension. There is no tenderness.  Musculoskeletal: She exhibits no edema.  Lymphadenopathy:    She has no cervical adenopathy.  Neurological: She is alert and oriented to person, place, and time.  Skin: Skin is warm and dry.  Psychiatric: She has a normal mood and affect. Her behavior is normal.          Assessment & Plan:

## 2014-01-11 NOTE — Progress Notes (Signed)
Pre visit review using our clinic review tool, if applicable. No additional management support is needed unless otherwise documented below in the visit note. 

## 2014-01-12 ENCOUNTER — Telehealth: Payer: Self-pay | Admitting: Family Medicine

## 2014-01-12 LAB — CBC WITH DIFFERENTIAL/PLATELET
Basophils Absolute: 0 10*3/uL (ref 0.0–0.1)
Basophils Relative: 0.1 % (ref 0.0–3.0)
Eosinophils Absolute: 0.1 10*3/uL (ref 0.0–0.7)
Eosinophils Relative: 1.4 % (ref 0.0–5.0)
HCT: 38.8 % (ref 36.0–46.0)
Hemoglobin: 12.7 g/dL (ref 12.0–15.0)
Lymphocytes Relative: 44.4 % (ref 12.0–46.0)
Lymphs Abs: 2.7 10*3/uL (ref 0.7–4.0)
MCHC: 32.7 g/dL (ref 30.0–36.0)
MCV: 92.4 fl (ref 78.0–100.0)
Monocytes Absolute: 0.3 10*3/uL (ref 0.1–1.0)
Monocytes Relative: 5.5 % (ref 3.0–12.0)
Neutro Abs: 2.9 10*3/uL (ref 1.4–7.7)
Neutrophils Relative %: 48.6 % (ref 43.0–77.0)
Platelets: 289 10*3/uL (ref 150.0–400.0)
RBC: 4.19 Mil/uL (ref 3.87–5.11)
RDW: 12.6 % (ref 11.5–14.6)
WBC: 6 10*3/uL (ref 4.5–10.5)

## 2014-01-12 NOTE — Telephone Encounter (Signed)
Relevant patient education assigned to patient using Emmi. ° °

## 2014-01-14 NOTE — Assessment & Plan Note (Signed)
New.  Check labs to r/o hypothyroid, anemia, electrolyte abnormality.  Will follow.

## 2014-01-14 NOTE — Assessment & Plan Note (Signed)
Recurrent issue for pt.  EKG w/o acute abnormality and pt has been having sxs since January.  Check labs to r/o metabolic cause.  Refer to cards- pt has seen Dr Stanford Breed in the past.  Will follow.

## 2014-01-14 NOTE — Assessment & Plan Note (Signed)
Chronic problem.  Restart HCTZ component as pt doesn't feel BP is as well controlled.  Monitor for improvement.

## 2014-01-15 LAB — BASIC METABOLIC PANEL
BUN: 11 mg/dL (ref 6–23)
CO2: 23 mEq/L (ref 19–32)
Calcium: 9.4 mg/dL (ref 8.4–10.5)
Chloride: 106 mEq/L (ref 96–112)
Creatinine, Ser: 0.7 mg/dL (ref 0.4–1.2)
GFR: 102.52 mL/min (ref >60.00)
Glucose, Bld: 84 mg/dL (ref 70–99)
Potassium: 4.6 mEq/L (ref 3.5–5.1)
Sodium: 139 mEq/L (ref 135–145)

## 2014-01-15 LAB — TSH: TSH: 0.75 u[IU]/mL (ref 0.35–5.50)

## 2014-01-31 ENCOUNTER — Other Ambulatory Visit (HOSPITAL_BASED_OUTPATIENT_CLINIC_OR_DEPARTMENT_OTHER): Payer: Self-pay | Admitting: Neurology

## 2014-01-31 DIAGNOSIS — G43909 Migraine, unspecified, not intractable, without status migrainosus: Secondary | ICD-10-CM

## 2014-02-03 ENCOUNTER — Ambulatory Visit (HOSPITAL_BASED_OUTPATIENT_CLINIC_OR_DEPARTMENT_OTHER)
Admission: RE | Admit: 2014-02-03 | Discharge: 2014-02-03 | Disposition: A | Discharge status: Home / Self Care | Payer: 59 | Source: Ambulatory Visit | Attending: Neurology | Admitting: Neurology

## 2014-02-03 DIAGNOSIS — R413 Other amnesia: Secondary | ICD-10-CM | POA: Insufficient documentation

## 2014-02-03 DIAGNOSIS — R51 Headache: Secondary | ICD-10-CM | POA: Insufficient documentation

## 2014-02-03 DIAGNOSIS — G43909 Migraine, unspecified, not intractable, without status migrainosus: Secondary | ICD-10-CM

## 2014-02-03 DIAGNOSIS — F29 Unspecified psychosis not due to a substance or known physiological condition: Secondary | ICD-10-CM | POA: Insufficient documentation

## 2014-02-03 MED ORDER — GADOBENATE DIMEGLUMINE 529 MG/ML IV SOLN
15.0000 mL | Freq: Once | INTRAVENOUS | Status: AC | PRN
  Administered 2014-02-03: 15 mL via INTRAVENOUS

## 2014-02-05 ENCOUNTER — Encounter: Payer: Self-pay | Admitting: Family Medicine

## 2014-02-05 ENCOUNTER — Ambulatory Visit (INDEPENDENT_AMBULATORY_CARE_PROVIDER_SITE_OTHER): Payer: 59 | Admitting: Family Medicine

## 2014-02-05 VITALS — BP 118/72 | HR 95 | Temp 98.5°F | Resp 16 | Wt 138.0 lb

## 2014-02-05 DIAGNOSIS — I10 Essential (primary) hypertension: Secondary | ICD-10-CM

## 2014-02-05 DIAGNOSIS — H9191 Unspecified hearing loss, right ear: Secondary | ICD-10-CM

## 2014-02-05 DIAGNOSIS — M898X1 Other specified disorders of bone, shoulder: Secondary | ICD-10-CM | POA: Insufficient documentation

## 2014-02-05 DIAGNOSIS — H9190 Unspecified hearing loss, unspecified ear: Secondary | ICD-10-CM

## 2014-02-05 DIAGNOSIS — M79609 Pain in unspecified limb: Secondary | ICD-10-CM

## 2014-02-05 NOTE — Assessment & Plan Note (Signed)
New.  Refer pt to audiology for complete assessment and tx if possible.  Will follow.

## 2014-02-05 NOTE — Assessment & Plan Note (Signed)
Recurrent problem for pt.  Offered ortho referral- she declined and stated she would schedule an appt w/ Dr Barbaraann Barthel.  NSAIDs prn.  Will follow.

## 2014-02-05 NOTE — Patient Instructions (Signed)
Schedule your complete physical for July Continue the Losartan HCTZ combo pill We'll call you with your Audiology appt Schedule an appt w/ Sports Med for your clavicle pain We'll notify you of your lab results and make any changes if needed Call with any questions or concerns Happy Spring!

## 2014-02-05 NOTE — Assessment & Plan Note (Signed)
Improved since restarting the HCTZ component.  Check BMP.  No anticipated med changes.

## 2014-02-05 NOTE — Progress Notes (Signed)
Pre visit review using our clinic review tool, if applicable. No additional management support is needed unless otherwise documented below in the visit note. 

## 2014-02-05 NOTE — Progress Notes (Signed)
   Subjective:    Patient ID: Nicole Crosby, female    DOB: 1965-12-08, 48 y.o.   MRN: [id number]  HPI HTN- chronic problem, at last visit was restarted on HCTZ component.  Lost 4 lbs on first day.  BP readings from home are excellent (120-127/70-74).  Still having CP, intermittent SOB- has appt upcoming w/ cards.  Chronic daily HAs.  No edema.  Decreased hearing in R ear- now starting to interfere w/ family and at work.  Has seen audiology x2- last checked 2 yrs ago.  L clavicular pain- recurrent problem, pain is intermittent, not related to activity.  No known injury.  Has seen Dr Barbaraann Barthel previously.   Review of Systems For ROS see HPI     Objective:   Physical Exam  Vitals reviewed. Constitutional: She is oriented to person, place, and time. She appears well-developed and well-nourished. No distress.  HENT:  Head: Normocephalic and atraumatic.  Right Ear: External ear normal.  Left Ear: External ear normal.  TMs normal bilaterally  Eyes: Conjunctivae and EOM are normal. Pupils are equal, round, and reactive to light.  Neck: Normal range of motion. Neck supple. No thyromegaly present.  Cardiovascular: Normal rate, regular rhythm, normal heart sounds and intact distal pulses.   No murmur heard. Pulmonary/Chest: Effort normal and breath sounds normal. No respiratory distress.  Abdominal: Soft. She exhibits no distension. There is no tenderness.  Musculoskeletal: She exhibits no edema.  Lymphadenopathy:    She has no cervical adenopathy.  Neurological: She is alert and oriented to person, place, and time.  Skin: Skin is warm and dry.  Psychiatric: She has a normal mood and affect. Her behavior is normal.          Assessment & Plan:

## 2014-02-06 LAB — BASIC METABOLIC PANEL
BUN: 11 mg/dL (ref 6–23)
CO2: 29 mEq/L (ref 19–32)
Calcium: 9.1 mg/dL (ref 8.4–10.5)
Chloride: 103 mEq/L (ref 96–112)
Creatinine, Ser: 0.6 mg/dL (ref 0.4–1.2)
GFR: 114.61 mL/min (ref >60.00)
Glucose, Bld: 81 mg/dL (ref 70–99)
Potassium: 3.6 mEq/L (ref 3.5–5.1)
Sodium: 138 mEq/L (ref 135–145)

## 2014-02-07 ENCOUNTER — Encounter: Payer: Self-pay | Admitting: Family Medicine

## 2014-02-07 ENCOUNTER — Other Ambulatory Visit: Payer: Self-pay | Admitting: Family Medicine

## 2014-02-07 DIAGNOSIS — Z1231 Encounter for screening mammogram for malignant neoplasm of breast: Secondary | ICD-10-CM

## 2014-02-08 MED ORDER — NORGESTIMATE-ETH ESTRADIOL 0.25-35 MG-MCG PO TABS: 1.0000 | ORAL_TABLET | Freq: Every day | ORAL | Status: DC

## 2014-02-13 ENCOUNTER — Ambulatory Visit (HOSPITAL_COMMUNITY): Payer: 59

## 2014-02-13 ENCOUNTER — Ambulatory Visit (HOSPITAL_COMMUNITY)
Admission: RE | Admit: 2014-02-13 | Discharge: 2014-02-13 | Disposition: A | Discharge status: Home / Self Care | Payer: 59 | Source: Ambulatory Visit | Attending: Family Medicine | Admitting: Family Medicine

## 2014-02-13 DIAGNOSIS — Z1231 Encounter for screening mammogram for malignant neoplasm of breast: Secondary | ICD-10-CM | POA: Insufficient documentation

## 2014-02-14 ENCOUNTER — Ambulatory Visit: Payer: 59 | Admitting: Cardiology

## 2014-02-14 ENCOUNTER — Ambulatory Visit: Payer: 59 | Admitting: Family Medicine

## 2014-02-15 ENCOUNTER — Ambulatory Visit: Payer: 59 | Admitting: Family Medicine

## 2014-02-23 ENCOUNTER — Telehealth: Payer: Self-pay | Admitting: Family Medicine

## 2014-02-23 NOTE — Telephone Encounter (Signed)
Caller name: Jenn Relation to pt: pharmacy [id number] Call back number: Pharmacy: [facility]  Reason for call: pharmacy wanted [place] to know that patient is switching contraceptives from current med to previous med.

## 2014-03-14 ENCOUNTER — Encounter: Payer: Self-pay | Admitting: *Deleted

## 2014-03-14 ENCOUNTER — Ambulatory Visit (INDEPENDENT_AMBULATORY_CARE_PROVIDER_SITE_OTHER): Payer: 59 | Admitting: Cardiology

## 2014-03-14 ENCOUNTER — Encounter: Payer: Self-pay | Admitting: Cardiology

## 2014-03-14 ENCOUNTER — Ambulatory Visit: Payer: 59 | Admitting: Cardiology

## 2014-03-14 VITALS — BP 138/84 | HR 105 | Ht 61.0 in | Wt 139.0 lb

## 2014-03-14 DIAGNOSIS — I10 Essential (primary) hypertension: Secondary | ICD-10-CM

## 2014-03-14 DIAGNOSIS — R079 Chest pain, unspecified: Secondary | ICD-10-CM | POA: Insufficient documentation

## 2014-03-14 NOTE — Progress Notes (Signed)
HPI: FU palpitations and chest pain. Previous workup in 2005 apparently normal per patient. She had an echocardiogram in August of 2010 that revealed normal LV function. No valvular abnormalities were noted. Stress echocardiogram was performed on October 10, 2009 and was normal. A CardioNet was also performed and showed sinus to sinus tachycardia with rare PAC. Stress echo repeated May 2012 and was normal. DDimer elevated but chest CT showed no pulmonary embolus. Patient last seen in June of 2013. Since then, Since January she has had intermittent chest pain. It is substernal and described as a pressure like "my bra is too tight". It can also occasionally be under her left breast. It is not pleuritic, positional, exertional or related to food. Last 15-20 minutes and resolves spontaneously. She notes some dyspnea on exertion. No orthopnea or pedal edema.   Current Outpatient Prescriptions  Medication Sig Dispense Refill  . calcium carbonate (OS-CAL) 600 MG TABS Take 600 mg by mouth daily.      Marland Kitchen dicyclomine (BENTYL) 20 MG tablet Take 20 mg by mouth as needed.      . etonogestrel-ethinyl estradiol (NUVARING) 0.12-0.015 MG/24HR vaginal ring Place 1 each vaginally every 28 (twenty-eight) days. Insert vaginally and leave in place for 3 consecutive weeks, then remove for 1 week.      . losartan-hydrochlorothiazide (HYZAAR) 50-12.5 MG per tablet Take 1 tablet by mouth daily.  90 tablet  3  . Melatonin 3 MG CAPS Take 1 capsule by mouth as needed.        . Multiple Vitamin (MULTIVITAMIN) tablet Take 1 tablet by mouth daily.        Marland Kitchen omeprazole (PRILOSEC) 40 MG capsule Take 1 capsule (40 mg total) by mouth daily.  30 capsule  3   No current facility-administered medications for this visit.     Past Medical History  Diagnosis Date  . IBS (irritable bowel syndrome)   . Insomnia   . Other abnormal glucose     Tiny hypodensity left hepatic lobe-CT of abdomen and pelvis 2/42/3536-RWERX 87mm  periumblical hernia  . PAC (premature atrial contraction)   . Hyperlipidemia   . GERD (gastroesophageal reflux disease)   . Hiatal hernia   . Hypertension     pt denies  . Palpitations   . Functional dyspepsia   . Chicken pox     Past Surgical History  Procedure Laterality Date  . De quervain's release  2000    Right  . Cesarean section  01/2001    History   Social History  . Marital Status: Married    Spouse Name: N/A    Number of Children: 2  . Years of Education: N/A   Occupational History  . Xray/CT Tech [employer]   Social History Main Topics  . Smoking status: Never Smoker   . Smokeless tobacco: Never Used  . Alcohol Use: 0.6 oz/week    1 Glasses of wine per week     Comment: occasional  . Drug Use: No  . Sexual Activity: Yes    Birth Control/ Protection: Inserts   Other Topics Concern  . 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    Alcohol Use - yes-wine         Full Time    Married            ROS: no fevers or chills, productive cough, hemoptysis, dysphasia, odynophagia,  melena, hematochezia, dysuria, hematuria, rash, seizure activity, orthopnea, PND, pedal edema, claudication. Remaining systems are negative.  Physical Exam: Well-developed well-nourished in no acute distress.  Skin is warm and dry.  HEENT is normal.  Neck is supple.  Chest is clear to auscultation with normal expansion.  Cardiovascular exam is regular rate and rhythm.  Abdominal exam nontender or distended. No masses palpated. Extremities show no edema. neuro grossly intact  ECG Sinus rhythm at a rate of 90. Axis normal. Cannot rule out prior septal infarct. Nonspecific ST changes.

## 2014-03-14 NOTE — Assessment & Plan Note (Signed)
Symptoms somewhat atypical. She has minor nonspecific ST changes. Will arrange stress echocardiogram to further evaluate.

## 2014-03-14 NOTE — Patient Instructions (Signed)
Your physician recommends that you schedule a follow-up appointment in: AS NEEDED PENDING TEST RESULTS  Your physician has requested that you have a stress echocardiogram. For further information please visit [email or web address]. Please follow instruction sheet as given.

## 2014-03-14 NOTE — Assessment & Plan Note (Signed)
Continue present blood pressure medications. 

## 2014-03-15 ENCOUNTER — Encounter: Payer: Self-pay | Admitting: Family Medicine

## 2014-03-16 MED ORDER — OMEPRAZOLE 20 MG PO CPDR: 20.0000 mg | DELAYED_RELEASE_CAPSULE | Freq: Every day | ORAL | Status: DC

## 2014-03-16 NOTE — Telephone Encounter (Signed)
Med filled.  

## 2014-03-19 ENCOUNTER — Encounter: Payer: Self-pay | Admitting: Cardiology

## 2014-04-03 ENCOUNTER — Ambulatory Visit (HOSPITAL_COMMUNITY): Payer: 59 | Attending: Cardiology

## 2014-04-03 ENCOUNTER — Ambulatory Visit (HOSPITAL_COMMUNITY): Payer: 59 | Attending: Cardiology | Admitting: Radiology

## 2014-04-03 DIAGNOSIS — R002 Palpitations: Secondary | ICD-10-CM | POA: Insufficient documentation

## 2014-04-03 DIAGNOSIS — R0989 Other specified symptoms and signs involving the circulatory and respiratory systems: Secondary | ICD-10-CM

## 2014-04-03 DIAGNOSIS — R5381 Other malaise: Secondary | ICD-10-CM | POA: Insufficient documentation

## 2014-04-03 DIAGNOSIS — R5383 Other fatigue: Secondary | ICD-10-CM | POA: Insufficient documentation

## 2014-04-03 DIAGNOSIS — R079 Chest pain, unspecified: Secondary | ICD-10-CM | POA: Insufficient documentation

## 2014-04-03 DIAGNOSIS — R072 Precordial pain: Secondary | ICD-10-CM

## 2014-04-03 DIAGNOSIS — I10 Essential (primary) hypertension: Secondary | ICD-10-CM | POA: Insufficient documentation

## 2014-04-03 NOTE — Progress Notes (Signed)
Stress Echocardiogram performed.  

## 2014-04-18 ENCOUNTER — Ambulatory Visit: Payer: 59 | Admitting: Cardiology

## 2014-05-20 ENCOUNTER — Encounter: Payer: Self-pay | Admitting: Family Medicine

## 2014-05-21 ENCOUNTER — Other Ambulatory Visit: Payer: Self-pay | Admitting: Family Medicine

## 2014-05-21 NOTE — Telephone Encounter (Signed)
Med filled.  

## 2014-06-07 ENCOUNTER — Ambulatory Visit: Payer: 59

## 2014-06-07 ENCOUNTER — Encounter: Payer: Self-pay | Admitting: Family Medicine

## 2014-06-07 ENCOUNTER — Ambulatory Visit (INDEPENDENT_AMBULATORY_CARE_PROVIDER_SITE_OTHER): Payer: 59 | Admitting: Family Medicine

## 2014-06-07 VITALS — BP 120/80 | HR 90 | Temp 98.2°F | Resp 16 | Ht 62.5 in | Wt 137.2 lb

## 2014-06-07 DIAGNOSIS — Z01419 Encounter for gynecological examination (general) (routine) without abnormal findings: Secondary | ICD-10-CM

## 2014-06-07 DIAGNOSIS — R946 Abnormal results of thyroid function studies: Secondary | ICD-10-CM

## 2014-06-07 DIAGNOSIS — R0602 Shortness of breath: Secondary | ICD-10-CM

## 2014-06-07 LAB — HEPATIC FUNCTION PANEL
ALT: 16 U/L (ref 0–35)
AST: 16 U/L (ref 0–37)
Albumin: 3.6 g/dL (ref 3.5–5.2)
Alkaline Phosphatase: 44 U/L (ref 39–117)
Bilirubin, Direct: 0 mg/dL (ref 0.0–0.3)
Total Bilirubin: 0.4 mg/dL (ref 0.2–1.2)
Total Protein: 7.1 g/dL (ref 6.0–8.3)

## 2014-06-07 LAB — CBC WITH DIFFERENTIAL/PLATELET
Basophils Absolute: 0 10*3/uL (ref 0.0–0.1)
Basophils Relative: 0.3 % (ref 0.0–3.0)
Eosinophils Absolute: 0.1 10*3/uL (ref 0.0–0.7)
Eosinophils Relative: 0.7 % (ref 0.0–5.0)
HCT: 40.4 % (ref 36.0–46.0)
Hemoglobin: 13.2 g/dL (ref 12.0–15.0)
Lymphocytes Relative: 30.6 % (ref 12.0–46.0)
Lymphs Abs: 2.7 10*3/uL (ref 0.7–4.0)
MCHC: 32.7 g/dL (ref 30.0–36.0)
MCV: 92.3 fl (ref 78.0–100.0)
Monocytes Absolute: 0.5 10*3/uL (ref 0.1–1.0)
Monocytes Relative: 5.6 % (ref 3.0–12.0)
Neutro Abs: 5.6 10*3/uL (ref 1.4–7.7)
Neutrophils Relative %: 62.8 % (ref 43.0–77.0)
Platelets: 332 10*3/uL (ref 150.0–400.0)
RBC: 4.38 Mil/uL (ref 3.87–5.11)
RDW: 12.8 % (ref 11.5–15.5)
WBC: 8.9 10*3/uL (ref 4.0–10.5)

## 2014-06-07 LAB — LIPID PANEL
Cholesterol: 180 mg/dL (ref 0–200)
HDL: 51.7 mg/dL (ref >39.00)
LDL Cholesterol: 103 mg/dL — ABNORMAL HIGH (ref 0–99)
NonHDL: 128.3
Total CHOL/HDL Ratio: 3
Triglycerides: 127 mg/dL (ref 0.0–149.0)
VLDL: 25.4 mg/dL (ref 0.0–40.0)

## 2014-06-07 LAB — BASIC METABOLIC PANEL
BUN: 12 mg/dL (ref 6–23)
CO2: 28 mEq/L (ref 19–32)
Calcium: 9.1 mg/dL (ref 8.4–10.5)
Chloride: 101 mEq/L (ref 96–112)
Creatinine, Ser: 0.6 mg/dL (ref 0.4–1.2)
GFR: 104.21 mL/min (ref >60.00)
Glucose, Bld: 68 mg/dL — ABNORMAL LOW (ref 70–99)
Potassium: 3.2 mEq/L — ABNORMAL LOW (ref 3.5–5.1)
Sodium: 136 mEq/L (ref 135–145)

## 2014-06-07 LAB — TSH: TSH: 0.25 u[IU]/mL — ABNORMAL LOW (ref 0.35–4.50)

## 2014-06-07 LAB — VITAMIN D 25 HYDROXY (VIT D DEFICIENCY, FRACTURES): VITD: 42.83 ng/mL (ref 30.00–100.00)

## 2014-06-07 MED ORDER — DICYCLOMINE HCL 20 MG PO TABS: 20.0000 mg | ORAL_TABLET | ORAL | Status: DC | PRN

## 2014-06-07 NOTE — Assessment & Plan Note (Signed)
Pt had normal stress test.  Pt has not had CXR- ordered.

## 2014-06-07 NOTE — Patient Instructions (Signed)
Follow up in 6 months to recheck BP Get your CXR at [facility] at your convenience Schedule your colonoscopy at your convenience We'll notify you of your lab results and make any changes if needed Keep up the good work!  You look great! Call with any questions or concerns Enjoy the rest of your summer!!!

## 2014-06-07 NOTE — Assessment & Plan Note (Signed)
Pt's PE WNL.  UTD on pap and mammo.  Check labs.  Anticipatory guidance provided.

## 2014-06-07 NOTE — Progress Notes (Signed)
Pre visit review using our clinic review tool, if applicable. No additional management support is needed unless otherwise documented below in the visit note. 

## 2014-06-07 NOTE — Progress Notes (Signed)
   Subjective:    Patient ID: Nicole Crosby, female    DOB: August 09, 1966, 48 y.o.   MRN: [id number]  HPI CPE- UTD on mammo, pap.  Due for colonoscopy (sees Dr Carlean Purl), plans to schedule later.   Review of Systems Patient reports no vision/ hearing changes, adenopathy, fever, weight change,  persistant/recurrent hoarseness , swallowing issues, palpitations, edema, persistant/recurrent cough, hemoptysis, gastrointestinal bleeding (melena, rectal bleeding), abdominal pain, significant heartburn, bowel changes, GU symptoms (dysuria, hematuria, incontinence), Gyn symptoms (abnormal  bleeding, pain),  syncope, focal weakness, memory loss, numbness & tingling, skin/hair/nail changes, abnormal bruising or bleeding, anxiety, or depression.  Still having intermittent CP and SOB- had normal stress test.  Has not had CXR    Objective:   Physical Exam General Appearance:    Alert, cooperative, no distress, appears stated age  Head:    Normocephalic, without obvious abnormality, atraumatic  Eyes:    PERRL, conjunctiva/corneas clear, EOM's intact, fundi    benign, both eyes  Ears:    Normal TM's and external ear canals, both ears  Nose:   Nares normal, septum midline, mucosa normal, no drainage    or sinus tenderness  Throat:   Lips, mucosa, and tongue normal; teeth and gums normal  Neck:   Supple, symmetrical, trachea midline, no adenopathy;    Thyroid: no enlargement/tenderness/nodules  Back:     Symmetric, no curvature, ROM normal, no CVA tenderness  Lungs:     Clear to auscultation bilaterally, respirations unlabored  Chest Wall:    No tenderness or deformity   Heart:    Regular rate and rhythm, S1 and S2 normal, no murmur, rub   or gallop  Breast Exam:    Deferred to mammo  Abdomen:     Soft, non-tender, bowel sounds active all four quadrants,    no masses, no organomegaly  Genitalia:    Deferred  Rectal:    Extremities:   Extremities normal, atraumatic, no cyanosis or edema  Pulses:   2+  and symmetric all extremities  Skin:   Skin color, texture, turgor normal, no rashes or lesions  Lymph nodes:   Cervical, supraclavicular, and axillary nodes normal  Neurologic:   CNII-XII intact, normal strength, sensation and reflexes    throughout          Assessment & Plan:

## 2014-06-08 ENCOUNTER — Other Ambulatory Visit: Payer: Self-pay | Admitting: General Practice

## 2014-06-08 LAB — T4, FREE: Free T4: 0.92 ng/dL (ref 0.60–1.60)

## 2014-06-08 LAB — T3, FREE: T3, Free: 3.1 pg/mL (ref 2.3–4.2)

## 2014-06-08 MED ORDER — POTASSIUM CHLORIDE CRYS ER 20 MEQ PO TBCR: 20.0000 meq | EXTENDED_RELEASE_TABLET | Freq: Every day | ORAL | Status: DC

## 2014-06-13 ENCOUNTER — Ambulatory Visit (HOSPITAL_BASED_OUTPATIENT_CLINIC_OR_DEPARTMENT_OTHER)
Admission: RE | Admit: 2014-06-13 | Discharge: 2014-06-13 | Disposition: A | Discharge status: Home / Self Care | Payer: 59 | Source: Ambulatory Visit | Attending: Family Medicine | Admitting: Family Medicine

## 2014-06-13 DIAGNOSIS — R0602 Shortness of breath: Secondary | ICD-10-CM | POA: Insufficient documentation

## 2014-06-13 DIAGNOSIS — R079 Chest pain, unspecified: Secondary | ICD-10-CM | POA: Insufficient documentation

## 2014-08-07 ENCOUNTER — Encounter: Payer: Self-pay | Admitting: Internal Medicine

## 2014-08-10 ENCOUNTER — Other Ambulatory Visit: Payer: Self-pay | Admitting: Family Medicine

## 2014-08-10 NOTE — Telephone Encounter (Signed)
Med filled.  

## 2014-09-03 ENCOUNTER — Encounter: Payer: Self-pay | Admitting: Family Medicine

## 2014-09-03 ENCOUNTER — Ambulatory Visit (INDEPENDENT_AMBULATORY_CARE_PROVIDER_SITE_OTHER): Payer: 59 | Admitting: Family Medicine

## 2014-09-03 VITALS — BP 130/80 | HR 103 | Temp 98.5°F | Resp 16 | Wt 136.1 lb

## 2014-09-03 DIAGNOSIS — R1013 Epigastric pain: Secondary | ICD-10-CM

## 2014-09-03 DIAGNOSIS — K219 Gastro-esophageal reflux disease without esophagitis: Secondary | ICD-10-CM

## 2014-09-03 DIAGNOSIS — R5383 Other fatigue: Secondary | ICD-10-CM

## 2014-09-03 LAB — BASIC METABOLIC PANEL WITH GFR
BUN: 12 mg/dL (ref 6–23)
CO2: 25 meq/L (ref 19–32)
Calcium: 9.4 mg/dL (ref 8.4–10.5)
Chloride: 101 meq/L (ref 96–112)
Creatinine, Ser: 0.8 mg/dL (ref 0.4–1.2)
GFR: 86.69 mL/min
Glucose, Bld: 80 mg/dL (ref 70–99)
Potassium: 4.2 meq/L (ref 3.5–5.1)
Sodium: 138 meq/L (ref 135–145)

## 2014-09-03 LAB — CBC WITH DIFFERENTIAL/PLATELET
Basophils Absolute: 0 10*3/uL (ref 0.0–0.1)
Basophils Relative: 0.5 % (ref 0.0–3.0)
Eosinophils Absolute: 0.1 10*3/uL (ref 0.0–0.7)
Eosinophils Relative: 1.1 % (ref 0.0–5.0)
HCT: 42.4 % (ref 36.0–46.0)
Hemoglobin: 13.8 g/dL (ref 12.0–15.0)
Lymphocytes Relative: 39 % (ref 12.0–46.0)
Lymphs Abs: 3 10*3/uL (ref 0.7–4.0)
MCHC: 32.5 g/dL (ref 30.0–36.0)
MCV: 91.8 fl (ref 78.0–100.0)
Monocytes Absolute: 0.5 10*3/uL (ref 0.1–1.0)
Monocytes Relative: 6.6 % (ref 3.0–12.0)
Neutro Abs: 4.1 10*3/uL (ref 1.4–7.7)
Neutrophils Relative %: 52.8 % (ref 43.0–77.0)
Platelets: 391 10*3/uL (ref 150.0–400.0)
RBC: 4.61 Mil/uL (ref 3.87–5.11)
RDW: 13.1 % (ref 11.5–15.5)
WBC: 7.7 10*3/uL (ref 4.0–10.5)

## 2014-09-03 LAB — TSH: TSH: 0.69 u[IU]/mL (ref 0.35–4.50)

## 2014-09-03 LAB — HEPATIC FUNCTION PANEL
ALT: 15 U/L (ref 0–35)
AST: 19 U/L (ref 0–37)
Albumin: 3.3 g/dL — ABNORMAL LOW (ref 3.5–5.2)
Alkaline Phosphatase: 45 U/L (ref 39–117)
Bilirubin, Direct: 0 mg/dL (ref 0.0–0.3)
Total Bilirubin: 0.4 mg/dL (ref 0.2–1.2)
Total Protein: 7.4 g/dL (ref 6.0–8.3)

## 2014-09-03 LAB — H. PYLORI ANTIBODY, IGG: H Pylori IgG: NEGATIVE

## 2014-09-03 LAB — LIPASE: Lipase: 30 U/L (ref 11.0–59.0)

## 2014-09-03 MED ORDER — PANTOPRAZOLE SODIUM 40 MG PO TBEC: 40.0000 mg | DELAYED_RELEASE_TABLET | Freq: Every day | ORAL | Status: DC

## 2014-09-03 NOTE — Progress Notes (Signed)
Pre visit review using our clinic review tool, if applicable. No additional management support is needed unless otherwise documented below in the visit note. 

## 2014-09-03 NOTE — Assessment & Plan Note (Signed)
Deteriorated.  Pt having increased GERD sxs, belching, and abdominal pain.  Known hiatal hernia and only on Omeprazole 20mg  daily.  Increase PPI to 40mg  daily and switch to protonix.  Check labs to r/o H pylori or biliary abnormality.  Pt has GI appt upcoming.  Reviewed supportive care and red flags that should prompt return.  Pt expressed understanding and is in agreement w/ plan.

## 2014-09-03 NOTE — Patient Instructions (Signed)
Follow up as needed w/ me and as scheduled w/ GI STOP the Omeprazole START the Protonix daily Try and avoid your stomach getting empty- eat small but frequent meals We'll notify you of your lab results and make any changes if needed Follow up w/ GI as scheduled Call with any questions or concerns Hang in there!!!

## 2014-09-03 NOTE — Assessment & Plan Note (Signed)
Recurrent problem for pt.  Hx of GERD and hiatal hernia.  Change PPI to Protonox 40.  Reviewed lifestyle and dietary modifications.  Check labs to r/o biliary cause, pancreatitis or other issue.  Reviewed supportive care and red flags that should prompt return.  Pt expressed understanding and is in agreement w/ plan.

## 2014-09-03 NOTE — Progress Notes (Signed)
   Subjective:    Patient ID: Nicole Crosby, female    DOB: 21-Aug-1966, 48 y.o.   MRN: [id number]  HPI 'something's not right'- increased fatigue for 2-3 weeks, intermittent SOB.  Wednesday night was lying in bed when she developed 'sharp pain, like a knife and radiated straight through to the front.  It felt like something was stuck'.  Increased belching and GERD.  On Omeprazole 20mg .  Pt reports sensation of esophageal spasm.  Pt had recent cardiac stress test that was WNL.  + nausea.  No vomiting.  Pt can't report whether sxs are better or worse w/ food.  Pt has upcoming colonoscopy on 11/30.  Pt w/ known hiatal hernia and IBS.   Review of Systems For ROS see HPI     Objective:   Physical Exam  Constitutional: She is oriented to person, place, and time. She appears well-developed and well-nourished. No distress.  HENT:  Head: Normocephalic and atraumatic.  Neck: Normal range of motion. Neck supple.  Cardiovascular: Normal rate, regular rhythm, normal heart sounds and intact distal pulses.   Pulmonary/Chest: Effort normal and breath sounds normal. No respiratory distress. She has no wheezes. She has no rales.  Abdominal: Soft. Bowel sounds are normal. She exhibits no distension. There is tenderness (diffuse abdominal tenderness). There is no rebound and no guarding.  Lymphadenopathy:    She has no cervical adenopathy.  Neurological: She is alert and oriented to person, place, and time.  Skin: Skin is warm and dry.  Psychiatric:  Anxious but otherwise normal  Vitals reviewed.         Assessment & Plan:

## 2014-09-03 NOTE — Assessment & Plan Note (Signed)
New.  Suspect this is multifactorial.  Increased stress, poor sleep, increased GERD sxs at night.  Check labs to r/o anemia or thyroid abnormality- treat abnormalities as they arise.  Pt expressed understanding and is in agreement w/ plan.

## 2014-09-13 ENCOUNTER — Telehealth: Payer: Self-pay | Admitting: *Deleted

## 2014-09-13 ENCOUNTER — Encounter: Payer: Self-pay | Admitting: Family Medicine

## 2014-09-13 DIAGNOSIS — K2970 Gastritis, unspecified, without bleeding: Secondary | ICD-10-CM

## 2014-09-13 MED ORDER — SUCRALFATE 1 G PO TABS: 1.0000 g | ORAL_TABLET | Freq: Three times a day (TID) | ORAL | Status: DC

## 2014-09-13 NOTE — Telephone Encounter (Signed)
       It would be okay, I suggested 1 though did not strongly recommend it in 2013 and she decided not to have one.   Weight loss, dysphagia and anemia are absolute indications. Everything else is what we call a relative indication meaning it's reasonable but not absolutely necessary.       Previous Messages     ----- Message -----   From: Laverna Peace, RN   Sent: 09/13/2014  9:49 AM    To: Gatha Mayer, MD   Dr. Carlean Purl,   This pt is coming in for her PV on 09-17-14 for a colonoscopy on 10-01-14. While getting her chart ready, I noted that she c/o increased GERD on her visit with her GP on 09-03-14. She saw you in the past in 2013. If she comes in with c/o this, would it be ok to schedule her for an EGD/Colon or would you like an OV first?    Jordan

## 2014-09-17 ENCOUNTER — Ambulatory Visit (AMBULATORY_SURGERY_CENTER): Payer: Self-pay | Admitting: *Deleted

## 2014-09-17 VITALS — Ht 61.0 in | Wt 139.4 lb

## 2014-09-17 DIAGNOSIS — Z1211 Encounter for screening for malignant neoplasm of colon: Secondary | ICD-10-CM

## 2014-09-17 NOTE — Progress Notes (Signed)
No issues with past sedation. ewm No blood thinners, no diet pills. ewm No egg or soy allergy. ewm No issues with past sedation. ewm emmi video to pt's e mail. ewm

## 2014-10-01 ENCOUNTER — Ambulatory Visit (AMBULATORY_SURGERY_CENTER): Payer: 59 | Admitting: Internal Medicine

## 2014-10-01 ENCOUNTER — Encounter: Payer: Self-pay | Admitting: Internal Medicine

## 2014-10-01 VITALS — BP 132/73 | HR 80 | Temp 98.3°F | Resp 51 | Ht 61.0 in | Wt 139.0 lb

## 2014-10-01 DIAGNOSIS — D122 Benign neoplasm of ascending colon: Secondary | ICD-10-CM

## 2014-10-01 DIAGNOSIS — Z1211 Encounter for screening for malignant neoplasm of colon: Secondary | ICD-10-CM

## 2014-10-01 MED ORDER — SODIUM CHLORIDE 0.9 % IV SOLN: 500.0000 mL | INTRAVENOUS | Status: DC

## 2014-10-01 NOTE — Patient Instructions (Addendum)
I found and removed one tiny polyp that looks benign. Everything else including terminal ileum looked ok.  I will let you know pathology results and when to have another routine colonoscopy by mail.  You may call and schedule an appointment to see me about gastrointestinal symptoms.  I appreciate the opportunity to care for you. Gatha Mayer, MD, [facility]   Discharge instructions given. Handout on polyps. Resume previous medications. Patient will call to schedule an appointment. YOU HAD AN ENDOSCOPIC PROCEDURE TODAY AT [facility]: Refer to the procedure report that was given to you for any specific questions about what was found during the examination.  If the procedure report does not answer your questions, please call your gastroenterologist to clarify.  If you requested that your care partner not be given the details of your procedure findings, then the procedure report has been included in a sealed envelope for you to review at your convenience later.  YOU SHOULD EXPECT: Some feelings of bloating in the abdomen. Passage of more gas than usual.  Walking can help get rid of the air that was put into your GI tract during the procedure and reduce the bloating. If you had a lower endoscopy (such as a colonoscopy or flexible sigmoidoscopy) you may notice spotting of blood in your stool or on the toilet paper. If you underwent a bowel prep for your procedure, then you may not have a normal bowel movement for a few days.  DIET: Your first meal following the procedure should be a light meal and then it is ok to progress to your normal diet.  A half-sandwich or bowl of soup is an example of a good first meal.  Heavy or fried foods are harder to digest and may make you feel nauseous or bloated.  Likewise meals heavy in dairy and vegetables can cause extra gas to form and this can also increase the bloating.  Drink plenty of fluids but you should avoid alcoholic beverages for  24 hours.  ACTIVITY: Your care partner should take you home directly after the procedure.  You should plan to take it easy, moving slowly for the rest of the day.  You can resume normal activity the day after the procedure however you should NOT DRIVE or use heavy machinery for 24 hours (because of the sedation medicines used during the test).    SYMPTOMS TO REPORT IMMEDIATELY: A gastroenterologist can be reached at any hour.  During normal business hours, 8:30 AM to 5:00 PM Monday through Friday, call [phone].  After hours and on weekends, please call the GI answering service at [phone] who will take a message and have the physician on call contact you.   Following lower endoscopy (colonoscopy or flexible sigmoidoscopy):  Excessive amounts of blood in the stool  Significant tenderness or worsening of abdominal pains  Swelling of the abdomen that is new, acute  Fever of 100F or higher  FOLLOW UP: If any biopsies were taken you will be contacted by phone or by letter within the next 1-3 weeks.  Call your gastroenterologist if you have not heard about the biopsies in 3 weeks.  Our staff will call the home number listed on your records the next business day following your procedure to check on you and address any questions or concerns that you may have at that time regarding the information given to you following your procedure. This is a courtesy call and so if there is no  answer at the home number and we have not heard from you through the emergency physician on call, we will assume that you have returned to your regular daily activities without incident.  SIGNATURES/CONFIDENTIALITY: You and/or your care partner have signed paperwork which will be entered into your electronic medical record.  These signatures attest to the fact that that the information above on your After Visit Summary has been reviewed and is understood.  Full responsibility of the confidentiality of this discharge  information lies with you and/or your care-partner.

## 2014-10-01 NOTE — Progress Notes (Signed)
Called to room to assist during endoscopic procedure.  Patient ID and intended procedure confirmed with present staff. Received instructions for my participation in the procedure from the performing physician.  

## 2014-10-01 NOTE — Progress Notes (Signed)
Stable to RR 

## 2014-10-01 NOTE — Op Note (Signed)
[place]  [address] [place], [address]   COLONOSCOPY PROCEDURE REPORT  PATIENT: Nicole Crosby, Nicole Crosby  MR#: [id number] BIRTHDATE: 01-03-1966 , 48  yrs. old GENDER: female ENDOSCOPIST: Gatha Mayer, MD, [facility] PROCEDURE DATE:  10/01/2014 PROCEDURE:   Colonoscopy with snare polypectomy First Screening Colonoscopy - Avg.  risk and is 50 yrs.  old or older Yes.  Prior Negative Screening - Now for repeat screening. N/A  History of Adenoma - Now for follow-up colonoscopy & has been > or = to 3 yrs.  N/A  Polyps Removed Today? Yes. ASA CLASS:   Class II INDICATIONS:first colonoscopy and average risk for colorectal cancer. MEDICATIONS: Propofol 250 mg IV and Monitored anesthesia care  DESCRIPTION OF PROCEDURE:   After the risks benefits and alternatives of the procedure were thoroughly explained, informed consent was obtained.  The digital rectal exam revealed no abnormalities of the rectum.   The [id number]  endoscope was introduced through the anus and advanced to the terminal ileum which was intubated for a short distance. No adverse events experienced.   The quality of the prep was excellent, using MiraLax The instrument was then slowly withdrawn as the colon was fully examined.      COLON FINDINGS: A sessile polyp measuring 3 mm in size was found in the ascending colon.  A polypectomy was performed with a cold snare.  The resection was complete, the polyp tissue was completely retrieved and sent to histology.   The examination was otherwise normal.  Retroflexed right colon and rectal views revealed no abnormalities. The time to cecum=2 minutes 03 seconds.  Withdrawal time=7 minutes 49 seconds.  The scope was withdrawn and the procedure completed. COMPLICATIONS: There were no immediate complications.  ENDOSCOPIC IMPRESSION: 1.   Sessile polyp measuring 3 mm in size was found in the ascending colon; polypectomy was performed with a cold snare 2.    The examination was otherwise normal - excellent prep - first colonoscopy  RECOMMENDATIONS: Timing of repeat colonoscopy will be determined by pathology findings. She will call to schedule an office visit about upper abdominal pain eSigned:  Gatha Mayer, MD, [facility] 10/01/2014 12:11 PM   cc: Dimple Nanas, MD and The Patient

## 2014-10-02 ENCOUNTER — Telehealth: Payer: Self-pay

## 2014-10-02 NOTE — Telephone Encounter (Signed)
  Follow up Call-  Call back number 10/01/2014  Post procedure Call Back phone  # [phone] cell  Permission to leave phone message Yes     Patient questions:  Do you have a fever, pain , or abdominal swelling? No. Pain Score  0 *  Have you tolerated food without any problems? Yes.    Have you been able to return to your normal activities? Yes.    Do you have any questions about your discharge instructions: Diet   No. Medications  No. Follow up visit  No.  Do you have questions or concerns about your Care? No.  Actions: * If pain score is 4 or above: No action needed, pain <4.

## 2014-10-04 ENCOUNTER — Encounter: Payer: Self-pay | Admitting: Internal Medicine

## 2014-10-04 DIAGNOSIS — Z860101 Personal history of adenomatous and serrated colon polyps: Secondary | ICD-10-CM

## 2014-10-04 DIAGNOSIS — Z86010 Personal history of colonic polyps: Secondary | ICD-10-CM

## 2014-10-04 HISTORY — DX: Personal history of adenomatous and serrated colon polyps: Z86.0101

## 2014-10-04 HISTORY — DX: Personal history of colonic polyps: Z86.010

## 2014-10-04 NOTE — Progress Notes (Signed)
Quick Note:  3 mm adenoma - repeat colon 7 years 2022 ______

## 2014-10-08 ENCOUNTER — Encounter: Payer: Self-pay | Admitting: Family Medicine

## 2014-10-29 ENCOUNTER — Other Ambulatory Visit: Payer: Self-pay | Admitting: Family Medicine

## 2014-10-29 NOTE — Telephone Encounter (Signed)
Medication Detail      Disp Refills Start End     potassium chloride SA (K-DUR,KLOR-CON) 20 MEQ tablet 30 tablet 3 06/08/2014     Sig - Route: Take 1 tablet (20 mEq total) by mouth daily. - Oral    Class: [place], [place]   Rx request to pharmacy/SLS

## 2014-10-29 NOTE — Telephone Encounter (Signed)
Medication Detail      Disp Refills Start End     potassium chloride SA (K-DUR,KLOR-CON) 20 MEQ tablet 30 tablet 1 10/29/2014     Sig: TAKE 1 TABLET BY MOUTH DAILY    E-Prescribing Status: Receipt confirmed by pharmacy (10/29/2014 3:28 PM EST)    Rx request Denied; Duplicate request/SLS

## 2014-12-13 ENCOUNTER — Ambulatory Visit (INDEPENDENT_AMBULATORY_CARE_PROVIDER_SITE_OTHER): Payer: 59 | Admitting: Family Medicine

## 2014-12-13 ENCOUNTER — Encounter: Payer: Self-pay | Admitting: Family Medicine

## 2014-12-13 VITALS — BP 134/84 | HR 99 | Temp 98.3°F | Resp 16 | Wt 139.5 lb

## 2014-12-13 DIAGNOSIS — I10 Essential (primary) hypertension: Secondary | ICD-10-CM

## 2014-12-13 DIAGNOSIS — N3289 Other specified disorders of bladder: Secondary | ICD-10-CM | POA: Insufficient documentation

## 2014-12-13 DIAGNOSIS — R5383 Other fatigue: Secondary | ICD-10-CM

## 2014-12-13 DIAGNOSIS — I491 Atrial premature depolarization: Secondary | ICD-10-CM

## 2014-12-13 MED ORDER — METOPROLOL TARTRATE 25 MG PO TABS: 12.5000 mg | ORAL_TABLET | Freq: Two times a day (BID) | ORAL | Status: DC

## 2014-12-13 NOTE — Progress Notes (Signed)
   Subjective:    Patient ID: Nicole Crosby, female    DOB: [birth year], 49 y.o.   MRN: [id number]  HPI HTN- chronic problem, on Losartan-HCTZ daily.  Adequate control.  Good med compliance.  Home BPs typically run 120/74.  No CP, SOB, HAs, visual changes, edema.  Hx of PACs, not currently on meds.  Pt reports she is able to feel these.  Pt was on Metoprolol previously but had excessive fatigue.  Bladder spasm- occuring nightly at 9pm and will need to urinate q20 minutes for 90 minutes and then sxs resolve.  sxs started >1 yr ago.  Doesn't occur any other time.  Is disruptive to sleep.  No difference between week nights/weekends.    Excessive fatigue- pt reports she is ready to go back to bed by 11 am after waking by 9am.  sxs started 4-5 months ago.  Denies depression.   Review of Systems For ROS see HPI     Objective:   Physical Exam  Constitutional: She is oriented to person, place, and time. She appears well-developed and well-nourished. No distress.  HENT:  Head: Normocephalic and atraumatic.  Eyes: Conjunctivae and EOM are normal. Pupils are equal, round, and reactive to light.  Neck: Normal range of motion. Neck supple. No thyromegaly present.  Cardiovascular: Normal rate, regular rhythm, normal heart sounds and intact distal pulses.   No murmur heard. Pulmonary/Chest: Effort normal and breath sounds normal. No respiratory distress.  Abdominal: Soft. She exhibits no distension. There is no tenderness.  Musculoskeletal: She exhibits no edema.  Lymphadenopathy:    She has no cervical adenopathy.  Neurological: She is alert and oriented to person, place, and time.  Skin: Skin is warm and dry.  Psychiatric: She has a normal mood and affect. Her behavior is normal.  Vitals reviewed.         Assessment & Plan:

## 2014-12-13 NOTE — Patient Instructions (Signed)
Follow up in 4-6 weeks to recheck PACs and fatigue We'll notify you of your lab results and make any changes if needed We'll call you with your urology appt for the bladder spasms Drink plenty of fluids Make sure you are eating regularly to avoid fatigue Try and get regular exercise Start the Metoprolol- 1/2 tab twice daily to control the PACs Call with any questions or concerns Hang in there! Happy Early Rudene Anda!

## 2014-12-13 NOTE — Progress Notes (Signed)
Pre visit review using our clinic review tool, if applicable. No additional management support is needed unless otherwise documented below in the visit note. 

## 2014-12-14 LAB — CBC WITH DIFFERENTIAL/PLATELET
Basophils Absolute: 0 10*3/uL (ref 0.0–0.1)
Basophils Relative: 0.4 % (ref 0.0–3.0)
Eosinophils Absolute: 0.1 10*3/uL (ref 0.0–0.7)
Eosinophils Relative: 0.9 % (ref 0.0–5.0)
HCT: 39.3 % (ref 36.0–46.0)
Hemoglobin: 13 g/dL (ref 12.0–15.0)
Lymphocytes Relative: 34.8 % (ref 12.0–46.0)
Lymphs Abs: 2.9 10*3/uL (ref 0.7–4.0)
MCHC: 33.2 g/dL (ref 30.0–36.0)
MCV: 89.8 fl (ref 78.0–100.0)
Monocytes Absolute: 0.5 10*3/uL (ref 0.1–1.0)
Monocytes Relative: 6.6 % (ref 3.0–12.0)
Neutro Abs: 4.7 10*3/uL (ref 1.4–7.7)
Neutrophils Relative %: 57.3 % (ref 43.0–77.0)
Platelets: 325 10*3/uL (ref 150.0–400.0)
RBC: 4.38 Mil/uL (ref 3.87–5.11)
RDW: 13.2 % (ref 11.5–15.5)
WBC: 8.3 10*3/uL (ref 4.0–10.5)

## 2014-12-14 LAB — BASIC METABOLIC PANEL
BUN: 15 mg/dL (ref 6–23)
CO2: 29 mEq/L (ref 19–32)
Calcium: 9.3 mg/dL (ref 8.4–10.5)
Chloride: 100 mEq/L (ref 96–112)
Creatinine, Ser: 0.69 mg/dL (ref 0.40–1.20)
GFR: 95.34 mL/min (ref >60.00)
Glucose, Bld: 84 mg/dL (ref 70–99)
Potassium: 3.9 mEq/L (ref 3.5–5.1)
Sodium: 136 mEq/L (ref 135–145)

## 2014-12-14 LAB — TSH: TSH: 0.98 u[IU]/mL (ref 0.35–4.50)

## 2014-12-16 NOTE — Assessment & Plan Note (Signed)
New.  Unclear why this is only occuring at bedtime and interfering w/ sleep.  Pt denies symptoms during the day.  Discussed avoidance of caffeine.  Limiting fluids prior to bed.  Will refer to Urology for complete evaluation and possible pelvic floor PT.  Pt expressed understanding and is in agreement w/ plan.

## 2014-12-16 NOTE — Assessment & Plan Note (Signed)
Recurrent problem.  Pt is not on sedating meds, denies depression.  Check TSH and electrolytes to r/o metabolic abnormality.  Discussed that this may be menopausal.  Unable to do hormonal tests due to use of hormonal contraception.  Pt not willing to stop contraception at this time.  Will follow.

## 2014-12-16 NOTE — Assessment & Plan Note (Signed)
Chronic problem.  Adequate control.  Again having symptomatic PACs.  Will start lower dose metoprolol than previous in order to control her sxs but to avoid excessive fatigue.  Will follow.

## 2014-12-25 ENCOUNTER — Encounter: Payer: Self-pay | Admitting: Family Medicine

## 2014-12-31 ENCOUNTER — Other Ambulatory Visit: Payer: Self-pay | Admitting: Family Medicine

## 2014-12-31 NOTE — Telephone Encounter (Signed)
Med filled.  

## 2015-01-07 ENCOUNTER — Ambulatory Visit: Payer: 59 | Admitting: Family Medicine

## 2015-01-14 ENCOUNTER — Other Ambulatory Visit: Payer: Self-pay | Admitting: Family Medicine

## 2015-01-14 NOTE — Telephone Encounter (Signed)
Med filled.  

## 2015-01-30 ENCOUNTER — Other Ambulatory Visit: Payer: Self-pay | Admitting: Family Medicine

## 2015-01-30 DIAGNOSIS — Z1231 Encounter for screening mammogram for malignant neoplasm of breast: Secondary | ICD-10-CM

## 2015-02-03 ENCOUNTER — Encounter: Payer: Self-pay | Admitting: Family Medicine

## 2015-02-04 MED ORDER — OMEPRAZOLE 40 MG PO CPDR: 40.0000 mg | DELAYED_RELEASE_CAPSULE | Freq: Every day | ORAL | Status: DC

## 2015-02-20 ENCOUNTER — Encounter: Payer: Self-pay | Admitting: Family Medicine

## 2015-02-21 MED ORDER — PANTOPRAZOLE SODIUM 40 MG PO TBEC: 40.0000 mg | DELAYED_RELEASE_TABLET | Freq: Every day | ORAL | Status: DC

## 2015-02-22 MED ORDER — LANSOPRAZOLE 30 MG PO CPDR: 30.0000 mg | DELAYED_RELEASE_CAPSULE | Freq: Every day | ORAL | Status: DC

## 2015-02-22 NOTE — Addendum Note (Signed)
Addended by: Midge Minium on: 02/22/2015 08:31 AM   Modules accepted: Orders, Medications

## 2015-02-26 ENCOUNTER — Ambulatory Visit (INDEPENDENT_AMBULATORY_CARE_PROVIDER_SITE_OTHER): Payer: 59 | Admitting: Internal Medicine

## 2015-02-26 ENCOUNTER — Encounter: Payer: Self-pay | Admitting: Internal Medicine

## 2015-02-26 VITALS — BP 122/68 | HR 83 | Temp 98.4°F | Resp 12 | Wt 139.0 lb

## 2015-02-26 DIAGNOSIS — E278 Other specified disorders of adrenal gland: Secondary | ICD-10-CM

## 2015-02-26 DIAGNOSIS — E279 Disorder of adrenal gland, unspecified: Secondary | ICD-10-CM | POA: Diagnosis not present

## 2015-02-26 NOTE — Patient Instructions (Addendum)
Please stop at the lab.  I ordered an adrenal protocol CT of the abdomen without contrast. If needed, this may be transformed into a contrasted one.  Patient information (Up-to-Date): Collection of a 24-hour urine specimen  - You should collect every drop of urine during each 24-hour period. It does not matter how much or little urine is passed each time, as long as every drop is collected. - Begin the urine collection in the morning after you wake up, after you have emptied your bladder for the first time. - Urinate (empty the bladder) for the first time and flush it down the toilet. Note the exact time (eg, 6:15 AM). You will begin the urine collection at this time. - Collect every drop of urine during the day and night in an empty collection bottle. Store the bottle at room temperature or in the refrigerator. - If you need to have a bowel movement, any urine passed with the bowel movement should be collected. Try not to include feces with the urine collection. If feces does get mixed in, do not try to remove the feces from the urine collection bottle. - Finish by collecting the first urine passed the next morning, adding it to the collection bottle. This should be within ten minutes before or after the time of the first morning void on the first day (which was flushed). In this example, you would try to void between 6:05 and 6:25 on the second day. - If you need to urinate one hour before the final collection time, drink a full glass of water so that you can void again at the appropriate time. If you have to urinate 20 minutes before, try to hold the urine until the proper time. - Please note the exact time of the final collection, even if it is not the same time as when collection began on day 1. - The bottle(s) may be kept at room temperature for a day or two, but should be kept cool or refrigerated for longer periods of time.

## 2015-02-26 NOTE — Progress Notes (Signed)
Subjective:     Patient ID: Nicole Crosby, female   DOB: [birth year], 49 y.o.   MRN: [id number]  HPI Nicole Crosby is a pleasant 49 y.o. woman, returning for f/u for of a right adrenal nodule. Last visit 05/2013.  Reviewed hx: The R adrenal nodule was first discovered on a CT obtained in 07/26/2008. At that time, it was commented that the adrenals look "normal", however, after a subsequent CT scan obtained on 02/18/2012 showed a 1.3 cm right adrenal nodule, a review of the previous CT scan images shown that the right nodule was preexistent, and unchanged. The Hounsfield units were not calculated. Also, the contrast washout percentage was not calculated. He was mentioned, however, on the report from last year, that the nodule looked benign. She is a Publishing rights manager, and she did discuss with the radiologist about the images, and he recommended to get a dedicated adrenal CT with contrast.  Patient describes that she was recently diagnosed with hypertension in 2014, now on Hyzaar. She has family history of hypertension in mother and father. She recently added metoprolol, however this is for palpitations.  She also has a history of headaches, sometimes perceived as pressure behind the right eye, without associated nausea, vomiting. Her father had history of cluster headaches. No history of flushing or pallor with the headaches.  Patient also mentions: - Cold intolerance - No weight gain since last visit - HTN - onset 08/2012  - palpitations - onset 2 years ago >> Holter monitor >> PACs; She also had atypical chest pain and palpitations in 2010, at that time a stress 2-D echo was normal; now on Toprol for palpitations  - HA - onset 2014, <1-2 episode a week, from back of neck - has NuvaRing  - no stretch marks on abdomen - no increased in sweating - No history of diabetes or hyperglycemia - No increased anxiety or depression  At last visit, we checked her for adrenal hormone  overproduction: Component     Latest Ref Rng 05/15/2013 05/22/2013 05/26/2013 06/07/2013  Epinephrine      27     Norepinephrine      436     Dopamine      REPORT     Catecholamines, Total      463     Metanephrine, Free     <=57 pg/mL 25     Normetanephrine, Free     <=148 pg/mL 85     Total Metanephrines-Plasma     <=205 pg/mL 110     Cortisol (Ur), Free     4.0 - 50.0 mcg/24 h    50.1 (H)  Results received     0.63 - 2.50 g/24 h    1.23  Creatinine, Urine         66.4  Creatinine, 24H Ur     700 - 1800 mg/day    1328  Dexamethasone, Serum       689    Cortisol, Plasma       20.2 14.4   Aldosterone 9. PRA 12.98 (0.25-5.82) Aldosterone/PRA ratio 0.7 (0.9-28.9)  Aldosterone, plasma metanephrines and plasma catecholamines were normal. Her Dexamethasone suppression test was abnormal (which happens as the cortisol binding globulin is increased by OCPs) and we had to check a UFC, which returned at the [facility].  She also has a history of GERD, hiatal hernia, insomnia, h/o stress fx right foot last year  Review of Systems Constitutional: no weight gain, no fatigue, + subjective hypothermia Eyes: +  blurry vision, no xerophthalmia ENT: no sore throat, no nodules palpated in throat, no dysphagia/odynophagia, no hoarseness; + decreased hearing Cardiovascular: no CP/SOB/+ palpitations - improved on metoprolol/no leg swelling Respiratory: no cough/SOB Gastrointestinal: no N/V/D/C Musculoskeletal: no muscle/+ joint aches: finger, foot, clavicle Skin: no rashes; excessive hair growth Neurological: no tremors/numbness/tingling/dizziness; + HA + Low libido   I reviewed pt's medications, allergies, PMH, social hx, family hx, and changes were documented in the history of present illness. Otherwise, unchanged from my initial visit note:  Past Medical History  Diagnosis Date  . IBS (irritable bowel syndrome)   . Insomnia   . Other abnormal glucose     Tiny hypodensity left hepatic lobe-CT  of abdomen and pelvis 06/11/1750-[facility] 60mm periumblical hernia  . PAC (premature atrial contraction)   . GERD (gastroesophageal reflux disease)   . Hiatal hernia   . Hypertension     pt denies  . Palpitations   . Functional dyspepsia   . Chicken pox   . Hyperlipidemia     no meds  . H/O adenomatous polyp of colon 10/04/2014    09/2014 - 3 mm adenoma - repeat colonoscopy 2022   Past Surgical History  Procedure Laterality Date  . De quervain's release  2000    Right wrist area  . Cesarean section  01/2001   History   Social History  . Marital Status: Married    Spouse Name: N/A    Number of Children: 2: 36 and 75 y/o  . Years of Education: N/A   Occupational History  . Xray/CT Tech [employer]   Social History Main Topics  . Smoking status: Never Smoker   . Smokeless tobacco: Never Used  . Alcohol Use: 0.6 oz/week    1 Glasses of wine per week     Comment: occasional  . Drug Use: No  . Sexually Active: Yes    Birth Control/ Protection: Inserts   Other Topics Concern  . 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    Alcohol Use - yes-wine         Full Time    Married           Current Outpatient Prescriptions on File Prior to Visit  Medication Sig Dispense Refill  . cholecalciferol (VITAMIN D) 1000 UNITS tablet Take 1,000 Units by mouth daily.    Marland Kitchen dicyclomine (BENTYL) 20 MG tablet Take 1 tablet (20 mg total) by mouth as needed. 60 tablet 3  . lansoprazole (PREVACID) 30 MG capsule Take 1 capsule (30 mg total) by mouth daily. 30 capsule 6  . losartan-hydrochlorothiazide (HYZAAR) 50-12.5 MG per tablet TAKE 1 TABLET BY MOUTH DAILY. 90 tablet 1  . meclizine (ANTIVERT) 25 MG tablet Take 25 mg by mouth 3 (three) times daily as needed for dizziness.    . Melatonin 3 MG CAPS Take 1 capsule by mouth as needed.      . metoprolol tartrate (LOPRESSOR) 25 MG tablet Take 0.5 tablets (12.5 mg total) by mouth 2 (two)  times daily. 30 tablet 3  . Multiple Vitamin (MULTIVITAMIN) tablet Take 1 tablet by mouth daily.      Marland Kitchen NUVARING 0.12-0.015 MG/24HR vaginal ring INSERT VAGINALLY AND LEAVE IN PLACE FOR 3 CONSECUTIVE WEEKS, THEN REMOVE FOR 1 WEEK. 1 each 11  . potassium chloride SA (K-DUR,KLOR-CON) 20 MEQ tablet TAKE 1 TABLET BY MOUTH DAILY 30 tablet 3   No current  facility-administered medications on file prior to visit.   Allergies  Allergen Reactions  . Buspirone Other (See Comments)    Headache, nightmare  . Sulfonamide Derivatives Rash     Rash    Family History  Problem Relation Age of Onset  . Hypertension Mother   . Hyperlipidemia Mother   . Fibrocystic breast disease Mother   . Hearing loss Mother   . Osteopenia Mother   . Prostate cancer Father 61    deceased  . Hypertension Father   . Diabetes Father   . Heart disease Father     MI  . Kidney disease Father   . Other Brother     unknown  . Thyroid disease Sister   . Diabetes Sister     pre-diabetes  . Hypertension Sister   . Arthritis Sister   . Diabetes Sister   . Hypertension Sister   . Hyperlipidemia Sister   . Fibrocystic breast disease Sister   . Colon cancer Maternal Uncle     died in late 31's  . Diabetes Maternal Grandmother   . Prostate cancer Maternal Grandfather   . COPD Maternal Grandfather   . Rectal cancer Neg Hx   . Stomach cancer Neg Hx   . Esophageal cancer Neg Hx   . Pancreatic cancer Neg Hx    Objective:   Physical Exam BP 122/68 mmHg  Pulse 83  Temp(Src) 98.4 F (36.9 C) (Oral)  Resp 12  Wt 139 lb (63.05 kg)  SpO2 98%  LMP  Wt Readings from Last 3 Encounters:  02/26/15 139 lb (63.05 kg)  12/13/14 139 lb 8 oz (63.277 kg)  10/01/14 139 lb (63.05 kg)   Constitutional: overweight, in NAD Eyes: PERRLA, EOMI, no exophthalmos ENT: moist mucous membranes, no thyromegaly, no cervical lymphadenopathy Cardiovascular: RRR, No MRG Respiratory: CTA B Gastrointestinal: abdomen soft, NT, ND,  BS+ Musculoskeletal: no deformities, strength intact in all 4 Skin: moist, warm, no rashes Neurological: no tremor with outstretched hands, DTR normal in all 4     Assessment:     1. Right adrenal nodule - stable in size from CT abdomen in 07/26/2008 to 02/18/2012: 1.3 cm - no dedicated adrenal CT checked     Plan:     1. Patient with a right adrenal nodule discovered incidentally in 2009, stable in size and appearance from on a CT scan in 2013. The studies were not dedicated adrenal CT scan, but the fact that the nodule did not change in size or appearance is encouraging.  - We discussed about the fact that there are 3 possible scenarios: - A nonfunctioning adrenal nodule - most likely for her - A functioning adrenal adenoma - which can hypersecrete either catecholamines, cortisol, or aldosterone - Adrenal cancer - The fact that the nodule did not change in size in 4 years would indicate that it is very unlikely that her nodule is cancerous.  - At last visit, we performed the following tests:  - dexamethasone suppression test to rule out Cushing syndrome. However, the fact that the patient was on oral contraceptives influenced the result due to elevation in cortisol binding globulin induced by the estrogen. We performed a 24-hour urine free cortisol, which returned at the upper limit of normal. In some patients, the estrogen can induce slight elevation of the 24-hour UFC, also. She is still on oral contraceptives so the best test to check now it still a 24-hour UFC. Will reorder, and I again explained how to do the collection. -  Plasma fractionated metanephrines and catecholamines to rule out pheochromocytoma - these were normal at last visit, therefore, at this visit, will only check the plasma fractionated metanephrines - Plasma renin activity and aldosterone level to rule out primary hyperaldosteronism - these were normal at last visit, we'll repeat them today  - We discussed about the  need for an other CT scan, this time a dedicated adrenal CT.  she prefers it without contrast, and I agree, provided that the Hounsfield units are low. However, if they are higher, we will need to add contrast to calculate the washout.  - I explained all the above to the patient, and she agrees with the plan. - RTC in 1 year if all tests normal  Office Visit on 02/26/2015  Component Date Value Ref Range Status  . PRA LC/Nicole/Nicole 02/26/2015 4.19  0.25 - 5.82 ng/mL/h Final  . ALDO / PRA Ratio 02/26/2015 1.2  0.9 - 28.9 Ratio Final  . Aldosterone 02/26/2015 5   Final   Comment:      Adult Reference Ranges for Aldosterone,    LC/Nicole/Nicole:       Upright 8:00-10:00 am    < or = 28 ng/dL     Upright 4:00-6:00 pm     < or = 21 ng/dL     Supine  8:00-10:00 am    3-16 ng/dL   . Potassium 02/26/2015 3.7  3.5 - 5.1 mEq/L Final  . Metanephrine, Free 02/26/2015 <25  <=57 pg/mL Final  . Normetanephrine, Free 02/26/2015 156* <=148 pg/mL Final  . Total Metanephrines-Plasma 02/26/2015 156  <=205 pg/mL Final   Comment: For more information on this test, go to http://education.quest[facility].com/faq/MetFractFree Elevations >4-fold upper reference range: strongly suggestive of a pheochromocytoma(1). Elevations >1- 4-fold upper reference range: significant but not diagnostic, may be due to medications or stress. Suggest running 24 hr urine fractionated metanephrines and/or serum Chromagranin A for confirmation. Reference: (1)Algeciras-Schimnich A et al, Plasma Chromogranin A or Urine Fractionated Metanephrines Follow-Up Testing Improves the Diagnostic Accuracy of Plasma Fractionated Metanephrines for Pheochromocytoma. The Journal of Clinical Endocrinology # Metabolism 93(1), 91-95, 2008.   . Creatinine, Urine 03/04/2015 44.6   Final   No reference range established.  . Creatinine, 24H Ur 03/04/2015 1115  700 - 1800 mg/day Final  . Cortisol (Ur), Free 03/04/2015 29.9  4.0 - 50.0 mcg/24 h Final    Comment:   Analysis performed by [employer]   . RESULTS RECEIVED 03/04/2015 1.16  0.63 - 2.50 g/24 h Final   No signs of hyperaldosteronism, hypercortisolism, or pheochromocytoma. Her normetanephrines are minimally elevated, which is a nonspecific finding. We will repeat them at next visit.  CLINICAL DATA: Adrenal nodule.  EXAM: CT ABDOMEN WITHOUT CONTRAST  TECHNIQUE: Multidetector CT imaging of the abdomen was performed following the standard protocol without IV contrast.  COMPARISON: Multiple exams, including 02/18/2012 and 07/26/2008  FINDINGS: Lower chest: Mild scarring in the lingula with subsegmental atelectasis or scarring anteriorly in the left lower lobe.  Hepatobiliary: Bilobed fluid density lesion in segment 2 of the liver, 1.8 by 1.3 cm, formerly 1.4 by 1.0 cm. Contracted gallbladder.  Pancreas: Unremarkable  Spleen: Unremarkable  Adrenals/Urinary Tract: 1.6 by 1.3 cm right adrenal mass, noncontrast internal density 37 Hounsfield units. Re calculating the relative washout on the prior exam, I arrive at 45% relative washout. This is consistent with adenoma. The lesion is not appreciably changed in size from 07/26/2008.  Stomach/Bowel: Unremarkable  Vascular/Lymphatic: Minimal atherosclerotic calcification in the abdominal aorta.  Other: No supplemental non-categorized findings.  Musculoskeletal: Small umbilical hernia contains adipose tissue.  IMPRESSION: 1. Although the density characteristics of the right adrenal mass on today's noncontrast exam are nonspecific, with a precontrast density of 37 Hounsfield units, when I calculate the relative washout on the exam from 02/18/2012 I arrive at a 45% relative washout, which is considered characteristic of adrenal adenoma. Moreover, the lesion is not appreciably changed in size from 07/26/2008. The overall appearance strongly favors benign adrenal adenoma. 2. A bilobed left  hepatic lobe fluid density lesion compatible with cyst has mildly enlarged since 2013.   Electronically Signed  By: Van Clines M.D.  On: 03/01/2015 08:03           Vitals     Height Weight BMI (Calculated)     139 lb (63.05 kg)       Interpretation Summary     CLINICAL DATA: Adrenal nodule.  EXAM: CT ABDOMEN WITHOUT CONTRAST  TECHNIQUE: Multidetector CT imaging of the abdomen was performed following the standard protocol without IV contrast.  COMPARISON: Multiple exams, including 02/18/2012 and 07/26/2008  FINDINGS: Lower chest: Mild scarring in the lingula with subsegmental atelectasis or scarring anteriorly in the left lower lobe.  Hepatobiliary: Bilobed fluid density lesion in segment 2 of the liver, 1.8 by 1.3 cm, formerly 1.4 by 1.0 cm. Contracted gallbladder.  Pancreas: Unremarkable  Spleen: Unremarkable  Adrenals/Urinary Tract: 1.6 by 1.3 cm right adrenal mass, noncontrast internal density 37 Hounsfield units. Re calculating the relative washout on the prior exam, I arrive at 45% relative washout. This is consistent with adenoma. The lesion is not appreciably changed in size from 07/26/2008.  Stomach/Bowel: Unremarkable  Vascular/Lymphatic: Minimal atherosclerotic calcification in the abdominal aorta.  Other: No supplemental non-categorized findings.  Musculoskeletal: Small umbilical hernia contains adipose tissue.  IMPRESSION: 1. Although the density characteristics of the right adrenal mass on today's noncontrast exam are nonspecific, with a precontrast density of 37 Hounsfield units, when I calculate the relative washout on the exam from 02/18/2012 I arrive at a 45% relative washout, which is considered characteristic of adrenal adenoma. Moreover, the lesion is not appreciably changed in size from 07/26/2008. The overall appearance strongly favors benign adrenal adenoma. 2. A bilobed left hepatic lobe  fluid density lesion compatible with cyst has mildly enlarged since 2013.   Electronically Signed  By: Van Clines M.D.  On: 03/01/2015 08:03   Benign-appearing right adrenal adenoma.

## 2015-02-27 LAB — POTASSIUM: Potassium: 3.7 mEq/L (ref 3.5–5.1)

## 2015-02-28 ENCOUNTER — Ambulatory Visit (INDEPENDENT_AMBULATORY_CARE_PROVIDER_SITE_OTHER): Payer: 59

## 2015-02-28 DIAGNOSIS — E279 Disorder of adrenal gland, unspecified: Secondary | ICD-10-CM | POA: Diagnosis not present

## 2015-02-28 DIAGNOSIS — K7689 Other specified diseases of liver: Secondary | ICD-10-CM

## 2015-03-01 ENCOUNTER — Ambulatory Visit (HOSPITAL_COMMUNITY)
Admission: RE | Admit: 2015-03-01 | Discharge: 2015-03-01 | Disposition: A | Discharge status: Home / Self Care | Payer: 59 | Source: Ambulatory Visit | Attending: Family Medicine | Admitting: Family Medicine

## 2015-03-01 DIAGNOSIS — Z1231 Encounter for screening mammogram for malignant neoplasm of breast: Secondary | ICD-10-CM | POA: Diagnosis present

## 2015-03-03 LAB — METANEPHRINES, PLASMA
Metanephrine, Free: <25 pg/mL (ref <=57)
Normetanephrine, Free: 156 pg/mL — ABNORMAL HIGH (ref <=148)
Total Metanephrines-Plasma: 156 pg/mL (ref <=205)

## 2015-03-04 ENCOUNTER — Other Ambulatory Visit: Payer: 59

## 2015-03-04 LAB — ALDOSTERONE + RENIN ACTIVITY W/ RATIO
ALDO / PRA Ratio: 1.2 Ratio (ref 0.9–28.9)
Aldosterone: 5 ng/dL
PRA LC/MS/MS: 4.19 ng/mL/h (ref 0.25–5.82)

## 2015-03-05 LAB — CREATININE, URINE, 24 HOUR
Creatinine, 24H Ur: 1115 mg/d (ref 700–1800)
Creatinine, Urine: 44.6 mg/dL

## 2015-03-07 LAB — CORTISOL, URINE, 24 HOUR
Cortisol (Ur), Free: 29.9 mcg/24 h (ref 4.0–50.0)
RESULTS RECEIVED: 1.16 g/(24.h) (ref 0.63–2.50)

## 2015-03-25 ENCOUNTER — Ambulatory Visit: Payer: 59 | Admitting: Family Medicine

## 2015-04-15 ENCOUNTER — Other Ambulatory Visit: Payer: Self-pay | Admitting: Family Medicine

## 2015-04-15 NOTE — Telephone Encounter (Signed)
Med filled.  

## 2015-04-18 ENCOUNTER — Encounter: Payer: Self-pay | Admitting: Family Medicine

## 2015-05-13 ENCOUNTER — Other Ambulatory Visit: Payer: Self-pay | Admitting: Family Medicine

## 2015-05-13 NOTE — Telephone Encounter (Signed)
Med filled.  

## 2015-06-12 ENCOUNTER — Encounter: Payer: Self-pay | Admitting: Family Medicine

## 2015-06-12 ENCOUNTER — Telehealth: Payer: Self-pay | Admitting: *Deleted

## 2015-06-12 NOTE — Telephone Encounter (Signed)
Unable to reach patient at time of Pre-Visit Call.  Left message for patient to return call when available.    

## 2015-06-13 ENCOUNTER — Encounter: Payer: Self-pay | Admitting: Family Medicine

## 2015-06-13 ENCOUNTER — Ambulatory Visit (INDEPENDENT_AMBULATORY_CARE_PROVIDER_SITE_OTHER): Payer: 59 | Admitting: Family Medicine

## 2015-06-13 VITALS — BP 124/80 | HR 88 | Temp 98.1°F | Resp 16 | Ht 61.0 in | Wt 141.2 lb

## 2015-06-13 DIAGNOSIS — Z01419 Encounter for gynecological examination (general) (routine) without abnormal findings: Secondary | ICD-10-CM | POA: Diagnosis not present

## 2015-06-13 NOTE — Patient Instructions (Signed)
Follow up in 6 months to recheck BP We'll notify you of your lab results and make any changes if needed Keep up the good work on healthy diet and regular exercise- you look great! Call with any questions or concerns Enjoy the rest of your summer! 

## 2015-06-13 NOTE — Progress Notes (Signed)
Pre visit review using our clinic review tool, if applicable. No additional management support is needed unless otherwise documented below in the visit note. 

## 2015-06-13 NOTE — Assessment & Plan Note (Signed)
Pt's PE WNL.  UTD on pap, mammo, colonoscopy.  Check labs when pt returns next week fasting.  Anticipatory guidance provided.

## 2015-06-13 NOTE — Progress Notes (Signed)
   Subjective:    Patient ID: Nicole Crosby, female    DOB: [birth year], 49 y.o.   MRN: [id number]  HPI CPE- UTD on colonoscopy, mammo, pap.  No concerns   Review of Systems Patient reports no vision/ hearing changes, adenopathy,fever, weight change,  persistant/recurrent hoarseness , swallowing issues, chest pain, palpitations, edema, persistant/recurrent cough, hemoptysis, dyspnea (rest/exertional/paroxysmal nocturnal), gastrointestinal bleeding (melena, rectal bleeding), abdominal pain, significant heartburn, bowel changes, GU symptoms (dysuria, hematuria, incontinence), Gyn symptoms (abnormal  bleeding, pain),  syncope, focal weakness, memory loss, numbness & tingling, skin/hair/nail changes, abnormal bruising or bleeding, anxiety, or depression.     Objective:   Physical Exam  General Appearance:    Alert, cooperative, no distress, appears stated age  Head:    Normocephalic, without obvious abnormality, atraumatic  Eyes:    PERRL, conjunctiva/corneas clear, EOM's intact, fundi    benign, both eyes  Ears:    Normal TM's and external ear canals, both ears  Nose:   Nares normal, septum midline, mucosa normal, no drainage    or sinus tenderness  Throat:   Lips, mucosa, and tongue normal; teeth and gums normal  Neck:   Supple, symmetrical, trachea midline, no adenopathy;    Thyroid: no enlargement/tenderness/nodules  Back:     Symmetric, no curvature, ROM normal, no CVA tenderness  Lungs:     Clear to auscultation bilaterally, respirations unlabored  Chest Wall:    No tenderness or deformity   Heart:    Regular rate and rhythm, S1 and S2 normal, no murmur, rub   or gallop  Breast Exam:    No tenderness, masses, or nipple abnormality  Abdomen:     Soft, non-tender, bowel sounds active all four quadrants,    no masses, no organomegaly  Genitalia:    deferred  Rectal:    Extremities:   Extremities normal, atraumatic, no cyanosis or edema  Pulses:   2+ and symmetric all extremities    Skin:   Skin color, texture, turgor normal, no rashes or lesions  Lymph nodes:   Cervical, supraclavicular, and axillary nodes normal  Neurologic:   CNII-XII intact, normal strength, sensation and reflexes    throughout          Assessment & Plan:

## 2015-06-17 ENCOUNTER — Other Ambulatory Visit: Payer: Self-pay | Admitting: General Practice

## 2015-06-17 ENCOUNTER — Other Ambulatory Visit (INDEPENDENT_AMBULATORY_CARE_PROVIDER_SITE_OTHER): Payer: 59

## 2015-06-17 ENCOUNTER — Other Ambulatory Visit: Payer: 59

## 2015-06-17 DIAGNOSIS — Z01419 Encounter for gynecological examination (general) (routine) without abnormal findings: Secondary | ICD-10-CM | POA: Diagnosis not present

## 2015-06-17 LAB — HEPATIC FUNCTION PANEL
ALT: 12 U/L (ref 0–35)
AST: 14 U/L (ref 0–37)
Albumin: 3.9 g/dL (ref 3.5–5.2)
Alkaline Phosphatase: 47 U/L (ref 39–117)
Bilirubin, Direct: 0.1 mg/dL (ref 0.0–0.3)
Total Bilirubin: 0.5 mg/dL (ref 0.2–1.2)
Total Protein: 7.2 g/dL (ref 6.0–8.3)

## 2015-06-17 LAB — LIPID PANEL
Cholesterol: 167 mg/dL (ref 0–200)
HDL: 47.5 mg/dL (ref >39.00)
LDL Cholesterol: 95 mg/dL (ref 0–99)
NonHDL: 119.73
Total CHOL/HDL Ratio: 4
Triglycerides: 123 mg/dL (ref 0.0–149.0)
VLDL: 24.6 mg/dL (ref 0.0–40.0)

## 2015-06-17 LAB — CBC WITH DIFFERENTIAL/PLATELET
Basophils Absolute: 0 10*3/uL (ref 0.0–0.1)
Basophils Relative: 0.4 % (ref 0.0–3.0)
Eosinophils Absolute: 0.1 10*3/uL (ref 0.0–0.7)
Eosinophils Relative: 1.5 % (ref 0.0–5.0)
HCT: 42.9 % (ref 36.0–46.0)
Hemoglobin: 14.3 g/dL (ref 12.0–15.0)
Lymphocytes Relative: 38.1 % (ref 12.0–46.0)
Lymphs Abs: 3.4 10*3/uL (ref 0.7–4.0)
MCHC: 33.2 g/dL (ref 30.0–36.0)
MCV: 91.7 fl (ref 78.0–100.0)
Monocytes Absolute: 0.5 10*3/uL (ref 0.1–1.0)
Monocytes Relative: 5.6 % (ref 3.0–12.0)
Neutro Abs: 4.8 10*3/uL (ref 1.4–7.7)
Neutrophils Relative %: 54.4 % (ref 43.0–77.0)
Platelets: 365 10*3/uL (ref 150.0–400.0)
RBC: 4.68 Mil/uL (ref 3.87–5.11)
RDW: 12.5 % (ref 11.5–15.5)
WBC: 8.8 10*3/uL (ref 4.0–10.5)

## 2015-06-17 LAB — BASIC METABOLIC PANEL
BUN: 13 mg/dL (ref 6–23)
CO2: 30 mEq/L (ref 19–32)
Calcium: 9.2 mg/dL (ref 8.4–10.5)
Chloride: 97 mEq/L (ref 96–112)
Creatinine, Ser: 0.63 mg/dL (ref 0.40–1.20)
GFR: 105.68 mL/min (ref >60.00)
Glucose, Bld: 84 mg/dL (ref 70–99)
Potassium: 3.4 mEq/L — ABNORMAL LOW (ref 3.5–5.1)
Sodium: 135 mEq/L (ref 135–145)

## 2015-06-17 LAB — VITAMIN D 25 HYDROXY (VIT D DEFICIENCY, FRACTURES): VITD: 24.78 ng/mL — ABNORMAL LOW (ref 30.00–100.00)

## 2015-06-17 LAB — TSH: TSH: 1.64 u[IU]/mL (ref 0.35–4.50)

## 2015-06-17 MED ORDER — VITAMIN D (ERGOCALCIFEROL) 1.25 MG (50000 UNIT) PO CAPS: 50000.0000 [IU] | ORAL_CAPSULE | ORAL | Status: DC

## 2015-06-19 ENCOUNTER — Encounter: Payer: Self-pay | Admitting: Family Medicine

## 2015-06-26 ENCOUNTER — Other Ambulatory Visit: Payer: Self-pay | Admitting: Family Medicine

## 2015-06-26 NOTE — Telephone Encounter (Signed)
Medication filled to pharmacy as requested.   

## 2015-07-18 ENCOUNTER — Other Ambulatory Visit: Payer: Self-pay | Admitting: General Practice

## 2015-07-18 MED ORDER — LOSARTAN POTASSIUM-HCTZ 50-12.5 MG PO TABS: 1.0000 | ORAL_TABLET | Freq: Every day | ORAL | Status: DC

## 2015-07-24 ENCOUNTER — Ambulatory Visit (INDEPENDENT_AMBULATORY_CARE_PROVIDER_SITE_OTHER): Payer: 59 | Admitting: Family Medicine

## 2015-07-24 ENCOUNTER — Other Ambulatory Visit (HOSPITAL_COMMUNITY)
Admission: RE | Admit: 2015-07-24 | Discharge: 2015-07-24 | Disposition: A | Discharge status: Home / Self Care | Payer: 59 | Source: Ambulatory Visit | Attending: Family Medicine | Admitting: Family Medicine

## 2015-07-24 ENCOUNTER — Encounter: Payer: Self-pay | Admitting: Family Medicine

## 2015-07-24 VITALS — BP 132/84 | HR 101 | Temp 98.2°F | Resp 16 | Wt 137.4 lb

## 2015-07-24 DIAGNOSIS — R1013 Epigastric pain: Secondary | ICD-10-CM

## 2015-07-24 DIAGNOSIS — Z124 Encounter for screening for malignant neoplasm of cervix: Secondary | ICD-10-CM

## 2015-07-24 DIAGNOSIS — Z1151 Encounter for screening for human papillomavirus (HPV): Secondary | ICD-10-CM | POA: Diagnosis present

## 2015-07-24 DIAGNOSIS — Z01419 Encounter for gynecological examination (general) (routine) without abnormal findings: Secondary | ICD-10-CM | POA: Diagnosis present

## 2015-07-24 LAB — CBC WITH DIFFERENTIAL/PLATELET
Basophils Absolute: 0 10*3/uL (ref 0.0–0.1)
Basophils Relative: 0.4 % (ref 0.0–3.0)
Eosinophils Absolute: 0.1 10*3/uL (ref 0.0–0.7)
Eosinophils Relative: 1.3 % (ref 0.0–5.0)
HCT: 42.8 % (ref 36.0–46.0)
Hemoglobin: 14.2 g/dL (ref 12.0–15.0)
Lymphocytes Relative: 32.8 % (ref 12.0–46.0)
Lymphs Abs: 2.7 10*3/uL (ref 0.7–4.0)
MCHC: 33.2 g/dL (ref 30.0–36.0)
MCV: 90.9 fl (ref 78.0–100.0)
Monocytes Absolute: 0.3 10*3/uL (ref 0.1–1.0)
Monocytes Relative: 3.9 % (ref 3.0–12.0)
Neutro Abs: 5.1 10*3/uL (ref 1.4–7.7)
Neutrophils Relative %: 61.6 % (ref 43.0–77.0)
Platelets: 308 10*3/uL (ref 150.0–400.0)
RBC: 4.71 Mil/uL (ref 3.87–5.11)
RDW: 12.6 % (ref 11.5–15.5)
WBC: 8.3 10*3/uL (ref 4.0–10.5)

## 2015-07-24 LAB — H. PYLORI ANTIBODY, IGG: H Pylori IgG: NEGATIVE

## 2015-07-24 LAB — BASIC METABOLIC PANEL
BUN: 13 mg/dL (ref 6–23)
CO2: 29 mEq/L (ref 19–32)
Calcium: 9.2 mg/dL (ref 8.4–10.5)
Chloride: 103 mEq/L (ref 96–112)
Creatinine, Ser: 0.68 mg/dL (ref 0.40–1.20)
GFR: 96.73 mL/min (ref >60.00)
Glucose, Bld: 126 mg/dL — ABNORMAL HIGH (ref 70–99)
Potassium: 3.1 mEq/L — ABNORMAL LOW (ref 3.5–5.1)
Sodium: 139 mEq/L (ref 135–145)

## 2015-07-24 LAB — HEPATIC FUNCTION PANEL
ALT: 16 U/L (ref 0–35)
AST: 17 U/L (ref 0–37)
Albumin: 4 g/dL (ref 3.5–5.2)
Alkaline Phosphatase: 44 U/L (ref 39–117)
Bilirubin, Direct: 0.1 mg/dL (ref 0.0–0.3)
Total Bilirubin: 0.4 mg/dL (ref 0.2–1.2)
Total Protein: 7.4 g/dL (ref 6.0–8.3)

## 2015-07-24 MED ORDER — ESOMEPRAZOLE MAGNESIUM 40 MG PO CPDR: 40.0000 mg | DELAYED_RELEASE_CAPSULE | Freq: Every day | ORAL | Status: DC

## 2015-07-24 NOTE — Assessment & Plan Note (Signed)
Recurrent issue for pt.  Not currently on PPI.  Pt's sxs- abd distention, bloating, GERD, consistent w/ acid overproduction.  Start PPI- pt has tolerated Nexium in the past but has had difficulty w/ Prevacid, Protonix.  Check labs to r/o infxn, H pylori, or biliary abnormalities.  Reviewed supportive care and red flags that should prompt return.  If no improvement on PPI will need GI referral.  Pt expressed understanding and is in agreement w/ plan.

## 2015-07-24 NOTE — Assessment & Plan Note (Signed)
Pap collected. 

## 2015-07-24 NOTE — Progress Notes (Signed)
   Subjective:    Patient ID: Nicole Crosby, female    DOB: November 12, 1965, 49 y.o.   MRN: [id number]  HPI Pap only- no vaginal d/c.  Hx of pelvic pain.   abd pain- pt reports upper abd pain, increased bloating and sensation of fullness w/ eating.  sxs started 9/9.  + nausea, no vomiting.  Taking pepto or emetrol for nausea.  No fevers.  Hx of IBS- took bentyl x3 days but then developed eye sxs (dry) and stopped.  Not taking Prevacid- 'it makes me loopy'.  Protonix causes HAs.   Review of Systems For ROS see HPI     Objective:   Physical Exam  Constitutional: She appears well-developed and well-nourished. No distress.  HENT:  Head: Normocephalic and atraumatic.  Eyes: Conjunctivae and EOM are normal. Pupils are equal, round, and reactive to light.  Pulmonary/Chest: Effort normal. No respiratory distress.  Abdominal: Soft. Bowel sounds are normal. She exhibits no distension and no mass. There is tenderness (TTP over epigastrum). There is no rebound and no guarding.  Genitourinary: Vagina normal. Rectal exam shows no external hemorrhoid. There is no rash, tenderness or lesion on the right labia. There is no rash, tenderness or lesion on the left labia. Uterus is not deviated, not enlarged, not fixed and not tender. Cervix exhibits no motion tenderness, no discharge and no friability. Right adnexum displays no mass, no tenderness and no fullness. Left adnexum displays no mass, no tenderness and no fullness. No erythema, tenderness or bleeding in the vagina. No vaginal discharge found.  Mild irregularity noted at cervical os but pt is just finishing menses  Vitals reviewed.         Assessment & Plan:

## 2015-07-24 NOTE — Progress Notes (Signed)
Pre visit review using our clinic review tool, if applicable. No additional management support is needed unless otherwise documented below in the visit note. 

## 2015-07-24 NOTE — Patient Instructions (Signed)
Follow up by phone or MyChart in 2 weeks to let me know how your abdominal pain is We'll notify you of your lab results and make any changes if needed START the Nexium daily Increase your water intake Try and avoid acidic or spicy foods, alcohol, or caffeine Call with any questions or concerns Happy Fall!!!

## 2015-07-26 LAB — CYTOLOGY - PAP

## 2015-09-09 ENCOUNTER — Other Ambulatory Visit: Payer: Self-pay | Admitting: Family Medicine

## 2015-09-09 NOTE — Telephone Encounter (Signed)
Medication filled to pharmacy as requested.   

## 2015-10-10 ENCOUNTER — Telehealth: Payer: Self-pay | Admitting: Family Medicine

## 2015-10-11 ENCOUNTER — Encounter: Payer: Self-pay | Admitting: Family Medicine

## 2015-10-11 ENCOUNTER — Ambulatory Visit (INDEPENDENT_AMBULATORY_CARE_PROVIDER_SITE_OTHER): Payer: 59 | Admitting: Family Medicine

## 2015-10-11 VITALS — BP 128/80 | HR 108 | Temp 98.0°F | Resp 16 | Ht 61.0 in | Wt 138.5 lb

## 2015-10-11 DIAGNOSIS — R142 Eructation: Secondary | ICD-10-CM | POA: Diagnosis not present

## 2015-10-11 DIAGNOSIS — R143 Flatulence: Secondary | ICD-10-CM

## 2015-10-11 DIAGNOSIS — R141 Gas pain: Secondary | ICD-10-CM | POA: Diagnosis not present

## 2015-10-11 LAB — BASIC METABOLIC PANEL
BUN: 11 mg/dL (ref 6–23)
CO2: 25 mEq/L (ref 19–32)
Calcium: 9.2 mg/dL (ref 8.4–10.5)
Chloride: 104 mEq/L (ref 96–112)
Creatinine, Ser: 0.66 mg/dL (ref 0.40–1.20)
GFR: 100.03 mL/min (ref >60.00)
Glucose, Bld: 75 mg/dL (ref 70–99)
Potassium: 2.9 mEq/L — ABNORMAL LOW (ref 3.5–5.1)
Sodium: 142 mEq/L (ref 135–145)

## 2015-10-11 LAB — CBC WITH DIFFERENTIAL/PLATELET
Basophils Absolute: 0.1 10*3/uL (ref 0.0–0.1)
Basophils Relative: 0.8 % (ref 0.0–3.0)
Eosinophils Absolute: 0.1 10*3/uL (ref 0.0–0.7)
Eosinophils Relative: 0.8 % (ref 0.0–5.0)
HCT: 43.4 % (ref 36.0–46.0)
Hemoglobin: 14.3 g/dL (ref 12.0–15.0)
Lymphocytes Relative: 38.7 % (ref 12.0–46.0)
Lymphs Abs: 3.1 10*3/uL (ref 0.7–4.0)
MCHC: 32.9 g/dL (ref 30.0–36.0)
MCV: 90.1 fl (ref 78.0–100.0)
Monocytes Absolute: 0.4 10*3/uL (ref 0.1–1.0)
Monocytes Relative: 4.8 % (ref 3.0–12.0)
Neutro Abs: 4.4 10*3/uL (ref 1.4–7.7)
Neutrophils Relative %: 54.9 % (ref 43.0–77.0)
Platelets: 329 10*3/uL (ref 150.0–400.0)
RBC: 4.82 Mil/uL (ref 3.87–5.11)
RDW: 12.9 % (ref 11.5–15.5)
WBC: 8.1 10*3/uL (ref 4.0–10.5)

## 2015-10-11 LAB — HEPATIC FUNCTION PANEL
ALT: 12 U/L (ref 0–35)
AST: 17 U/L (ref 0–37)
Albumin: 3.9 g/dL (ref 3.5–5.2)
Alkaline Phosphatase: 52 U/L (ref 39–117)
Bilirubin, Direct: 0.1 mg/dL (ref 0.0–0.3)
Total Bilirubin: 0.3 mg/dL (ref 0.2–1.2)
Total Protein: 7.2 g/dL (ref 6.0–8.3)

## 2015-10-11 NOTE — Progress Notes (Signed)
Pre visit review using our clinic review tool, if applicable. No additional management support is needed unless otherwise documented below in the visit note. 

## 2015-10-11 NOTE — Patient Instructions (Signed)
We'll notify you of your lab results and your CT scan Call and schedule an appt w/ Dr Carlean Purl Call with any questions or concerns Hang in there!! Happy Holidays!!!

## 2015-10-11 NOTE — Assessment & Plan Note (Signed)
Recurrent issue for pt.  Pain is worsening rather than improving.  + anorexia, early satiety.  Pt reports sxs are not consistent w/ IBS or GERD.  On PPI.  Check CA125 but pt just had GYN exam w/o abnormality.  Get CT scan to assess and refer back to GI for complete evaluation.  Pt expressed understanding and is in agreement w/ plan.

## 2015-10-11 NOTE — Progress Notes (Signed)
   Subjective:    Patient ID: Nicole Crosby, female    DOB: 01-20-1966, 49 y.o.   MRN: [id number]  HPI abd pain- 'this is not normal for me' (rubbing her belly).  Denies gas or change in BM.  Hx of IBS but 'this is not IBS'.  'I feel stretched and full'.  Despite feeling hungry, will get nausea w/ eating due to fullness.  sxs were previously intermittent but worsening over the last 3 weeks.  + anorexia.  No fevers.  Denies GERD.  + distension.  Pain radiates across level of umbilicus.  Currently on PPI.     Review of Systems For ROS see HPI     Objective:   Physical Exam  Constitutional: She is oriented to person, place, and time. She appears well-developed and well-nourished. No distress.  HENT:  Head: Normocephalic and atraumatic.  Eyes: Conjunctivae and EOM are normal. Pupils are equal, round, and reactive to light.  Neck: Normal range of motion. Neck supple.  Cardiovascular: Normal rate, regular rhythm and intact distal pulses.   Pulmonary/Chest: Effort normal and breath sounds normal. No respiratory distress. She has no wheezes.  Abdominal: Soft. Bowel sounds are normal. She exhibits no distension. There is no tenderness. There is no rebound and no guarding.  Lymphadenopathy:    She has no cervical adenopathy.  Neurological: She is alert and oriented to person, place, and time.  Skin: Skin is warm and dry.  Psychiatric: She has a normal mood and affect. Her behavior is normal. Thought content normal.  Vitals reviewed.         Assessment & Plan:

## 2015-10-12 ENCOUNTER — Ambulatory Visit (HOSPITAL_BASED_OUTPATIENT_CLINIC_OR_DEPARTMENT_OTHER)
Admission: RE | Admit: 2015-10-12 | Discharge: 2015-10-12 | Disposition: A | Discharge status: Home / Self Care | Payer: 59 | Source: Ambulatory Visit | Attending: Family Medicine | Admitting: Family Medicine

## 2015-10-12 DIAGNOSIS — K7689 Other specified diseases of liver: Secondary | ICD-10-CM | POA: Diagnosis not present

## 2015-10-12 DIAGNOSIS — R110 Nausea: Secondary | ICD-10-CM | POA: Insufficient documentation

## 2015-10-12 DIAGNOSIS — R140 Abdominal distension (gaseous): Secondary | ICD-10-CM | POA: Insufficient documentation

## 2015-10-12 DIAGNOSIS — R142 Eructation: Secondary | ICD-10-CM | POA: Insufficient documentation

## 2015-10-12 DIAGNOSIS — R141 Gas pain: Secondary | ICD-10-CM | POA: Insufficient documentation

## 2015-10-12 DIAGNOSIS — R6881 Early satiety: Secondary | ICD-10-CM | POA: Insufficient documentation

## 2015-10-12 DIAGNOSIS — R229 Localized swelling, mass and lump, unspecified: Secondary | ICD-10-CM | POA: Diagnosis not present

## 2015-10-12 DIAGNOSIS — R143 Flatulence: Secondary | ICD-10-CM | POA: Diagnosis not present

## 2015-10-12 DIAGNOSIS — R630 Anorexia: Secondary | ICD-10-CM | POA: Insufficient documentation

## 2015-10-12 LAB — CA 125: CA 125: 9 U/mL (ref <35)

## 2015-10-12 MED ORDER — IOHEXOL 300 MG/ML  SOLN
100.0000 mL | Freq: Once | INTRAMUSCULAR | Status: AC | PRN
  Administered 2015-10-12: 100 mL via INTRAVENOUS

## 2015-10-14 ENCOUNTER — Other Ambulatory Visit: Payer: Self-pay | Admitting: General Practice

## 2015-10-14 MED ORDER — POTASSIUM CHLORIDE CRYS ER 20 MEQ PO TBCR: 20.0000 meq | EXTENDED_RELEASE_TABLET | Freq: Two times a day (BID) | ORAL | Status: DC

## 2015-10-17 ENCOUNTER — Encounter: Payer: Self-pay | Admitting: Family Medicine

## 2015-10-17 ENCOUNTER — Telehealth: Payer: Self-pay | Admitting: Internal Medicine

## 2015-10-17 NOTE — Telephone Encounter (Signed)
Patient advised that I do not have any earlier appts.  I have placed her on the cancellation list.  She is also periodically encouraged to call and check for cancellations

## 2015-11-18 MED FILL — POTASSIUM CL ER 20 MEQ TABL: 20 | 30 days supply | Qty: 60 | Fill #1

## 2015-11-27 NOTE — Telephone Encounter (Signed)
error 

## 2015-12-16 ENCOUNTER — Ambulatory Visit (INDEPENDENT_AMBULATORY_CARE_PROVIDER_SITE_OTHER): Payer: 59 | Admitting: Internal Medicine

## 2015-12-16 ENCOUNTER — Encounter: Payer: Self-pay | Admitting: Internal Medicine

## 2015-12-16 VITALS — BP 130/78 | HR 72 | Ht 61.0 in | Wt 138.0 lb

## 2015-12-16 DIAGNOSIS — R6881 Early satiety: Secondary | ICD-10-CM

## 2015-12-16 DIAGNOSIS — R1010 Upper abdominal pain, unspecified: Secondary | ICD-10-CM | POA: Diagnosis not present

## 2015-12-16 DIAGNOSIS — R140 Abdominal distension (gaseous): Secondary | ICD-10-CM

## 2015-12-16 MED ORDER — HYOSCYAMINE SULFATE 0.125 MG SL SUBL: 0.1250 mg | SUBLINGUAL_TABLET | SUBLINGUAL | Status: DC | PRN

## 2015-12-16 MED ORDER — PANTOPRAZOLE SODIUM 40 MG PO TBEC
40.0000 mg | DELAYED_RELEASE_TABLET | Freq: Every day | ORAL | Status: DC
Start: 2015-12-16 — End: 2016-06-29

## 2015-12-16 MED FILL — HYOSCYAMINE 0.125 MG TAB SL: 0.125 | 10 days supply | Qty: 60 | Fill #0

## 2015-12-16 NOTE — Patient Instructions (Signed)
  You have been scheduled for an endoscopy. Please follow written instructions given to you at your visit today. If you use inhalers (even only as needed), please bring them with you on the day of your procedure. Your physician has requested that you go to www.startemmi.com and enter the access code given to you at your visit today. This web site gives a general overview about your procedure. However, you should still follow specific instructions given to you by our office regarding your preparation for the procedure.   We have sent the following medications to your pharmacy for you to pick up at your convenience: Generic Levsin SL   I appreciate the opportunity to care for you. Silvano Rusk, MD, [facility]

## 2015-12-16 NOTE — Progress Notes (Signed)
   Subjective:    Patient ID: Nicole Crosby, female    DOB: April 24, 1966, 50 y.o.   MRN: [id number]  CC: upper abdominal pain HPI The patient is a 50 yo female who presents with upper abdominal pain. She states the pain started in mid September 2016 and does not have a pattern to the pain - food doesn't affect the pain. Nothing makes the pain better or worse, and there is no radiation of pain. The pain wakes her up at night and is accompanied by nausea. She takes Emetrol with little relief. She states she also gets full quickly and then feels bloated. She has no appetite due to the pain. She feels like her weight is fluctuating each week. The patient restarted the dicyclomine after the symptoms started and it does not help, rather it makes her "loopy." She states the abdominal pain is different than the last time when the dicyclomine was helping. The Protonix is not lasting until the next dose - she is getting more heartburn and "acid in my throat."  Wt Readings from Last 3 Encounters:  12/16/15 138 lb (62.596 kg)  10/11/15 138 lb 8 oz (62.823 kg)  07/24/15 137 lb 6 oz (62.313 kg)    Review of Systems  Constitutional: Positive for appetite change and unexpected weight change.  Gastrointestinal: Positive for nausea, abdominal pain and abdominal distention. Negative for vomiting, diarrhea, constipation and blood in stool.       Early satiety, decrease in flatulence    Medications, allergies, past medical history, past surgical history, family history and social history are reviewed and updated in the EMR.     Objective:   Physical Exam Filed Vitals:   12/16/15 0831  BP: 130/78  Pulse: 72    Constitutional: Appears well-developed and well-nourished.  HENT:  Head: Normocephalic and atraumatic.  Eyes: No scleral icterus.  Neck: No tracheal deviation present.  Pulmonology: Normal breath sounds, no wheezing or rales Cardiovascular: Normal rate, regular rhythm and normal heart sounds.  No  murmurs, rubs, or gallops Abdominal: Soft. Bowel sounds are normal. Patient exhibits no distension and no mass. There is no rebound and no guarding. Slight generalized tenderness, especially in the epigastric area. Neurological: Patient is alert and oriented to person, place, and time.  Skin: Skin is warm and dry.  Psychiatric: Patient has a normal mood and affect. The behavior is normal. Judgment and thought content normal.      Assessment & Plan:  Upper abdominal pain - Plan: hyoscyamine (LEVSIN SL) 0.125 MG SL tablet  Early satiety - Plan: hyoscyamine (LEVSIN SL) 0.125 MG SL tablet  Bloating - Plan: hyoscyamine (LEVSIN SL) 0.125 MG SL tablet  Her length of symptoms and the nature of them warrant an EGD. The risks and benefits of the procedure were explained and the patient is agreeable.  Dicyclomine makes her "loopy," so will switch her to [place] SL 0.125 mg to help her IBS symptoms.   Since she has not responded well to PPI consider using H2Blockers instead depending upon EGD results  I saw the patient with Foye Spurling PA-S who served as a Education administrator

## 2015-12-17 ENCOUNTER — Encounter: Payer: Self-pay | Admitting: Family Medicine

## 2015-12-17 ENCOUNTER — Ambulatory Visit (INDEPENDENT_AMBULATORY_CARE_PROVIDER_SITE_OTHER): Payer: 59 | Admitting: Family Medicine

## 2015-12-17 VITALS — BP 128/90 | HR 110 | Temp 98.1°F | Ht 61.0 in | Wt 137.4 lb

## 2015-12-17 DIAGNOSIS — E785 Hyperlipidemia, unspecified: Secondary | ICD-10-CM | POA: Diagnosis not present

## 2015-12-17 DIAGNOSIS — I10 Essential (primary) hypertension: Secondary | ICD-10-CM | POA: Diagnosis not present

## 2015-12-17 DIAGNOSIS — R202 Paresthesia of skin: Secondary | ICD-10-CM | POA: Diagnosis not present

## 2015-12-17 LAB — CBC WITH DIFFERENTIAL/PLATELET
Basophils Absolute: 0 10*3/uL (ref 0.0–0.1)
Basophils Relative: 0.3 % (ref 0.0–3.0)
Eosinophils Absolute: 0.1 10*3/uL (ref 0.0–0.7)
Eosinophils Relative: 0.8 % (ref 0.0–5.0)
HCT: 39.7 % (ref 36.0–46.0)
Hemoglobin: 13.5 g/dL (ref 12.0–15.0)
Lymphocytes Relative: 34.8 % (ref 12.0–46.0)
Lymphs Abs: 2.8 10*3/uL (ref 0.7–4.0)
MCHC: 34 g/dL (ref 30.0–36.0)
MCV: 88.3 fl (ref 78.0–100.0)
Monocytes Absolute: 0.5 10*3/uL (ref 0.1–1.0)
Monocytes Relative: 6.5 % (ref 3.0–12.0)
Neutro Abs: 4.7 10*3/uL (ref 1.4–7.7)
Neutrophils Relative %: 57.6 % (ref 43.0–77.0)
Platelets: 325 10*3/uL (ref 150.0–400.0)
RBC: 4.49 Mil/uL (ref 3.87–5.11)
RDW: 13.3 % (ref 11.5–15.5)
WBC: 8.1 10*3/uL (ref 4.0–10.5)

## 2015-12-17 LAB — B12 AND FOLATE PANEL
Folate: 15.1 ng/mL (ref >5.9)
Vitamin B-12: 184 pg/mL — ABNORMAL LOW (ref 211–911)

## 2015-12-17 LAB — BASIC METABOLIC PANEL
BUN: 12 mg/dL (ref 6–23)
CO2: 24 mEq/L (ref 19–32)
Calcium: 9.4 mg/dL (ref 8.4–10.5)
Chloride: 106 mEq/L (ref 96–112)
Creatinine, Ser: 0.74 mg/dL (ref 0.40–1.20)
GFR: 87.6 mL/min (ref >60.00)
Glucose, Bld: 137 mg/dL — ABNORMAL HIGH (ref 70–99)
Potassium: 3 mEq/L — ABNORMAL LOW (ref 3.5–5.1)
Sodium: 141 mEq/L (ref 135–145)

## 2015-12-17 LAB — HEPATIC FUNCTION PANEL
ALT: 12 U/L (ref 0–35)
AST: 14 U/L (ref 0–37)
Albumin: 3.9 g/dL (ref 3.5–5.2)
Alkaline Phosphatase: 45 U/L (ref 39–117)
Bilirubin, Direct: 0.1 mg/dL (ref 0.0–0.3)
Total Bilirubin: 0.3 mg/dL (ref 0.2–1.2)
Total Protein: 7.2 g/dL (ref 6.0–8.3)

## 2015-12-17 LAB — LIPID PANEL
Cholesterol: 181 mg/dL (ref 0–200)
HDL: 53.2 mg/dL (ref >39.00)
LDL Cholesterol: 107 mg/dL — ABNORMAL HIGH (ref 0–99)
NonHDL: 128.15
Total CHOL/HDL Ratio: 3
Triglycerides: 107 mg/dL (ref 0.0–149.0)
VLDL: 21.4 mg/dL (ref 0.0–40.0)

## 2015-12-17 LAB — TSH: TSH: 0.83 u[IU]/mL (ref 0.35–4.50)

## 2015-12-17 LAB — VITAMIN D 25 HYDROXY (VIT D DEFICIENCY, FRACTURES): VITD: 28.41 ng/mL — ABNORMAL LOW (ref 30.00–100.00)

## 2015-12-17 NOTE — Assessment & Plan Note (Addendum)
New.  Pt reports she read that PPI use can cause B12 deficiency.  Now having sxs that she feels are related.  Check labs and replete prn.  Pt expressed understanding and is in agreement w/ plan.

## 2015-12-17 NOTE — Progress Notes (Signed)
Pre visit review using our clinic review tool, if applicable. No additional management support is needed unless otherwise documented below in the visit note. 

## 2015-12-17 NOTE — Patient Instructions (Signed)
Schedule your complete physical in 6 months We'll notify you of your lab results and make any changes if needed Keep up the good work on healthy diet and regular Call with any questions or concerns If you want to join [place] at the new [facility] office, any scheduled appointments will automatically transfer and we will see you at [address] [place] [address] [place], [place] [address] [facility]) Happy Valentine's Day!

## 2015-12-17 NOTE — Assessment & Plan Note (Signed)
Ongoing issue for pt.  With recent weight loss, she has been able to control levels w/ healthy diet and regular exercise.  Check labs.  Adjust tx plans prn.

## 2015-12-17 NOTE — Progress Notes (Signed)
   Subjective:    Patient ID: Nicole Crosby, female    DOB: [birth year], 50 y.o.   MRN: [id number]  HPI HTN- chronic problem, on Losartan HCTZ w/ adequate control.  Denies CP, SOB, HAs, visual changes, edema.  Hyperlipidemia- chronic problem, attempting to control w/ healthy diet and regular exercise.  Hypokalemia- ongoing issue for pt.  On Kdur twice daily.  Numbness and tingling- pt reports cluster of sxs that have her concerned for B12 deficiency including numbness, fatigue, memory issues.  Review of Systems For ROS see HPI     Objective:   Physical Exam  Constitutional: She is oriented to person, place, and time. She appears well-developed and well-nourished. No distress.  HENT:  Head: Normocephalic and atraumatic.  Eyes: Conjunctivae and EOM are normal. Pupils are equal, round, and reactive to light.  Neck: Normal range of motion. Neck supple. No thyromegaly present.  Cardiovascular: Normal rate, regular rhythm, normal heart sounds and intact distal pulses.   No murmur heard. Pulmonary/Chest: Effort normal and breath sounds normal. No respiratory distress.  Abdominal: Soft. She exhibits no distension. There is no tenderness.  Musculoskeletal: She exhibits no edema.  Lymphadenopathy:    She has no cervical adenopathy.  Neurological: She is alert and oriented to person, place, and time.  Skin: Skin is warm and dry.  Psychiatric: She has a normal mood and affect. Her behavior is normal.  Vitals reviewed.         Assessment & Plan:

## 2015-12-17 NOTE — Assessment & Plan Note (Signed)
Chronic problem.  Adequate but not ideal control today.  Pt is asymptomatic.  Check labs.  No anticipated med changes.  Will follow.

## 2015-12-23 MED FILL — POTASSIUM CL ER 20 MEQ TABL: 20 | 30 days supply | Qty: 60 | Fill #2

## 2015-12-24 ENCOUNTER — Ambulatory Visit (INDEPENDENT_AMBULATORY_CARE_PROVIDER_SITE_OTHER): Payer: 59 | Admitting: Behavioral Health

## 2015-12-24 DIAGNOSIS — E538 Deficiency of other specified B group vitamins: Secondary | ICD-10-CM | POA: Diagnosis not present

## 2015-12-24 MED ORDER — CYANOCOBALAMIN 1000 MCG/ML IJ SOLN
1000.0000 ug | Freq: Once | INTRAMUSCULAR | Status: AC
  Administered 2015-12-24: 1000 ug via INTRAMUSCULAR

## 2015-12-24 NOTE — Progress Notes (Signed)
Pre visit review using our clinic review tool, if applicable. No additional management support is needed unless otherwise documented below in the visit note.  Patient in office today for B12 injection. IM given in Left Deltoid. Patient tolerated injection well. Next appointment scheduled for 01/01/16 at 4:00 PM.

## 2015-12-26 MED FILL — PANTOPRAZOLE SOD DR 40 MG T: 40 | 30 days supply | Qty: 30 | Fill #1

## 2015-12-31 ENCOUNTER — Encounter: Payer: Self-pay | Admitting: Internal Medicine

## 2016-01-01 ENCOUNTER — Ambulatory Visit (INDEPENDENT_AMBULATORY_CARE_PROVIDER_SITE_OTHER): Payer: 59

## 2016-01-01 DIAGNOSIS — E538 Deficiency of other specified B group vitamins: Secondary | ICD-10-CM

## 2016-01-01 MED ORDER — CYANOCOBALAMIN 1000 MCG/ML IJ SOLN
1000.0000 ug | Freq: Once | INTRAMUSCULAR | Status: AC
  Administered 2016-01-01: 1000 ug via INTRAMUSCULAR

## 2016-01-01 MED FILL — NUVARING VAGINAL RING: 0.12-0.015 | 84 days supply | Qty: 3 | Fill #3

## 2016-01-01 NOTE — Progress Notes (Signed)
Pre visit review using our clinic review tool, if applicable. No additional management support is needed unless otherwise documented below in the visit note.  Patient in for B12 injection. Ordered by Dr. Annye Asa. Given IM Left deltoid. Patient tolerated well.

## 2016-01-08 ENCOUNTER — Ambulatory Visit (INDEPENDENT_AMBULATORY_CARE_PROVIDER_SITE_OTHER): Payer: 59 | Admitting: *Deleted

## 2016-01-08 DIAGNOSIS — E538 Deficiency of other specified B group vitamins: Secondary | ICD-10-CM

## 2016-01-08 MED ORDER — CYANOCOBALAMIN 1000 MCG/ML IJ SOLN
1000.0000 ug | Freq: Once | INTRAMUSCULAR | Status: AC
  Administered 2016-01-08: 1000 ug via INTRAMUSCULAR

## 2016-01-08 NOTE — Progress Notes (Signed)
Pre visit review using our clinic review tool, if applicable. No additional management support is needed unless otherwise documented below in the visit note.  Pt tolerated injection well.   Next appt: 01/15/16  Dorrene German, RN

## 2016-01-15 ENCOUNTER — Ambulatory Visit (INDEPENDENT_AMBULATORY_CARE_PROVIDER_SITE_OTHER): Payer: 59 | Admitting: Behavioral Health

## 2016-01-15 DIAGNOSIS — E538 Deficiency of other specified B group vitamins: Secondary | ICD-10-CM

## 2016-01-15 MED ORDER — CYANOCOBALAMIN 1000 MCG/ML IJ SOLN
1000.0000 ug | Freq: Once | INTRAMUSCULAR | Status: AC
  Administered 2016-01-15: 1000 ug via INTRAMUSCULAR

## 2016-01-15 NOTE — Progress Notes (Signed)
Pre visit review using our clinic review tool, if applicable. No additional management support is needed unless otherwise documented below in the visit note.  Patient in office today for B12 injection. IM given in Left Deltoid. Patient tolerated injection well. Next appointment scheduled for 02/19/16 at 3:45 PM.

## 2016-01-20 ENCOUNTER — Encounter: Payer: Self-pay | Admitting: Family Medicine

## 2016-01-20 ENCOUNTER — Other Ambulatory Visit: Payer: Self-pay | Admitting: Family Medicine

## 2016-01-20 MED FILL — POTASSIUM CL ER 20 MEQ TABL: 20 | 30 days supply | Qty: 60 | Fill #3

## 2016-01-20 MED FILL — LOSARTAN-HCTZ 50-12.5 MG TA: 50-12.5 | 90 days supply | Qty: 90 | Fill #0

## 2016-01-20 NOTE — Telephone Encounter (Signed)
Medication filled to pharmacy as requested.   

## 2016-02-06 ENCOUNTER — Ambulatory Visit (AMBULATORY_SURGERY_CENTER): Payer: 59 | Admitting: Internal Medicine

## 2016-02-06 ENCOUNTER — Encounter: Payer: Self-pay | Admitting: Internal Medicine

## 2016-02-06 VITALS — BP 115/68 | HR 82 | Temp 98.9°F | Resp 17 | Ht 61.0 in | Wt 138.0 lb

## 2016-02-06 DIAGNOSIS — K219 Gastro-esophageal reflux disease without esophagitis: Secondary | ICD-10-CM | POA: Diagnosis not present

## 2016-02-06 DIAGNOSIS — G8929 Other chronic pain: Secondary | ICD-10-CM | POA: Diagnosis not present

## 2016-02-06 DIAGNOSIS — R1010 Upper abdominal pain, unspecified: Secondary | ICD-10-CM

## 2016-02-06 DIAGNOSIS — K317 Polyp of stomach and duodenum: Secondary | ICD-10-CM

## 2016-02-06 DIAGNOSIS — I10 Essential (primary) hypertension: Secondary | ICD-10-CM | POA: Diagnosis not present

## 2016-02-06 DIAGNOSIS — R1011 Right upper quadrant pain: Principal | ICD-10-CM

## 2016-02-06 DIAGNOSIS — R1013 Epigastric pain: Secondary | ICD-10-CM | POA: Diagnosis not present

## 2016-02-06 MED ORDER — SODIUM CHLORIDE 0.9 % IV SOLN: 500.0000 mL | INTRAVENOUS | Status: DC

## 2016-02-06 NOTE — Progress Notes (Signed)
Called to room to assist during endoscopic procedure.  Patient ID and intended procedure confirmed with present staff. Received instructions for my participation in the procedure from the performing physician.  

## 2016-02-06 NOTE — Op Note (Signed)
[facility] Patient Name: Nicole Crosby Procedure Date: 02/06/2016 10:01 AM MRN: [id number] Endoscopist: Gatha Mayer , MD Age: 50 Date of Birth: 19-Jun-1966 Gender: Female Procedure:                Upper GI endoscopy Indications:              Abdominal pain in the right upper quadrant,                            Dyspepsia Medicines:                Propofol per Anesthesia, Monitored Anesthesia Care Procedure:                Pre-Anesthesia Assessment:                           - Prior to the procedure, a History and Physical                            was performed, and patient medications and                            allergies were reviewed. The patient's tolerance of                            previous anesthesia was also reviewed. The risks                            and benefits of the procedure and the sedation                            options and risks were discussed with the patient.                            All questions were answered, and informed consent                            was obtained. Prior Anticoagulants: The patient has                            taken no previous anticoagulant or antiplatelet                            agents. ASA Grade Assessment: II - A patient with                            mild systemic disease. After reviewing the risks                            and benefits, the patient was deemed in                            satisfactory condition to undergo the procedure.  After obtaining informed consent, the endoscope was                            passed under direct vision. Throughout the                            procedure, the patient's blood pressure, pulse, and                            oxygen saturations were monitored continuously. The                            Model GIF-HQ190 [id number]) scope was introduced                            through the mouth, and advanced to the second part                  of duodenum. The upper GI endoscopy was                            accomplished without difficulty. The patient                            tolerated the procedure well. Scope In: Scope Out: Findings:                 Multiple less than 5 mm sessile polyps with no                            bleeding and no stigmata of recent bleeding were                            found in the gastric fundus and in the gastric                            body. Biopsies were taken with a cold forceps for                            histology. Verification of patient identification                            for the specimen was done. Estimated blood loss was                            minimal.                           The exam was otherwise without abnormality.                           The cardia and gastric fundus were normal on                            retroflexion. Complications:  No immediate complications. Estimated Blood Loss:     Estimated blood loss was minimal. Impression:               - Multiple gastric polyps. Biopsied.                           - The examination was otherwise normal. Recommendation:           - Patient has a contact number available for                            emergencies. The signs and symptoms of potential                            delayed complications were discussed with the                            patient. Return to normal activities tomorrow.                            Written discharge instructions were provided to the                            patient.                           - Resume previous diet.                           - Continue present medications.                           - Await pathology results.                           - will consider trying high-dose H2 blocker vs                            tricyclic agent for functional dyspepsia pending                            biopsy review. 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 Gatha Mayer, MD 02/06/2016 10:19:44 AM This report has been signed electronically. CC Letter to:             Aundra Millet. Birdie Riddle, MD

## 2016-02-06 NOTE — Progress Notes (Signed)
Report to PACU, RN, vss, BBS= Clear.  

## 2016-02-06 NOTE — Patient Instructions (Addendum)
I found some benign-appearing stomach polyps that are very unlikely to be related to your symptoms. I took biopsies to be more sure.  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  Unless the biopsies tell me something different I think the next steps in treatment would be to either try high-dose H2 blockers (ranitidine, famotidine) or low-dose amitriptyline.  Will call when biopsies return.  I appreciate the opportunity to care for you. Gatha Mayer, MD, [facility]   Discharge instructions given. Biopsies taken. Resume previous medications. YOU HAD AN ENDOSCOPIC PROCEDURE TODAY AT [facility]:   Refer to the procedure report that was given to you for any specific questions about what was found during the examination.  If the procedure report does not answer your questions, please call your gastroenterologist to clarify.  If you requested that your care partner not be given the details of your procedure findings, then the procedure report has been included in a sealed envelope for you to review at your convenience later.  YOU SHOULD EXPECT: Some feelings of bloating in the abdomen. Passage of more gas than usual.  Walking can help get rid of the air that was put into your GI tract during the procedure and reduce the bloating. If you had a lower endoscopy (such as a colonoscopy or flexible sigmoidoscopy) you may notice spotting of blood in your stool or on the toilet paper. If you underwent a bowel prep for your procedure, you may not have a normal bowel movement for a few days.  Please Note:  You might notice some irritation and congestion in your nose or some drainage.  This is from the oxygen used during your procedure.  There is no need for concern and it should clear up in a day or so.  SYMPTOMS TO REPORT IMMEDIATELY:    Following upper endoscopy (EGD)  Vomiting of blood or coffee ground material  New chest pain or pain under the shoulder blades  Painful  or persistently difficult swallowing  New shortness of breath  Fever of 100F or higher  Black, tarry-looking stools  For urgent or emergent issues, a gastroenterologist can be reached at any hour by calling [phone].   DIET: Your first meal following the procedure should be a small meal and then it is ok to progress to your normal diet. Heavy or fried foods are harder to digest and may make you feel nauseous or bloated.  Likewise, meals heavy in dairy and vegetables can increase bloating.  Drink plenty of fluids but you should avoid alcoholic beverages for 24 hours.  ACTIVITY:  You should plan to take it easy for the rest of today and you should NOT DRIVE or use heavy machinery until tomorrow (because of the sedation medicines used during the test).    FOLLOW UP: Our staff will call the number listed on your records the next business day following your procedure to check on you and address any questions or concerns that you may have regarding the information given to you following your procedure. If we do not reach you, we will leave a message.  However, if you are feeling well and you are not experiencing any problems, there is no need to return our call.  We will assume that you have returned to your regular daily activities without incident.  If any biopsies were taken you will be contacted by phone or by letter within the next 1-3 weeks.  Please call us at [phone]  [phone] if you have not heard about the biopsies in 3 weeks.    SIGNATURES/CONFIDENTIALITY: You and/or your care partner have signed paperwork which will be entered into your electronic medical record.  These signatures attest to the fact that that the information above on your After Visit Summary has been reviewed and is understood.  Full responsibility of the confidentiality of this discharge information lies with you and/or your care-partner.

## 2016-02-07 ENCOUNTER — Telehealth: Payer: Self-pay | Admitting: *Deleted

## 2016-02-07 NOTE — Telephone Encounter (Signed)
  Follow up Call-  Call back number 02/06/2016 10/01/2014  Post procedure Call Back phone  # [phone] cell  Permission to leave phone message Yes Yes     Patient questions:  Do you have a fever, pain , or abdominal swelling? No. Pain Score  0 *  Have you tolerated food without any problems? Yes.    Have you been able to return to your normal activities? Yes.    Do you have any questions about your discharge instructions: Diet   No. Medications  No. Follow up visit  No.  Do you have questions or concerns about your Care? No.  Actions: * If pain score is 4 or above: No action needed, pain <4.

## 2016-02-12 ENCOUNTER — Encounter: Payer: Self-pay | Admitting: Internal Medicine

## 2016-02-12 ENCOUNTER — Other Ambulatory Visit: Payer: Self-pay

## 2016-02-12 DIAGNOSIS — K30 Functional dyspepsia: Secondary | ICD-10-CM

## 2016-02-12 DIAGNOSIS — D131 Benign neoplasm of stomach: Secondary | ICD-10-CM

## 2016-02-12 HISTORY — DX: Benign neoplasm of stomach: D13.1

## 2016-02-12 HISTORY — DX: Functional dyspepsia: K30

## 2016-02-12 MED ORDER — AMITRIPTYLINE HCL 25 MG PO TABS: 25.0000 mg | ORAL_TABLET | Freq: Every day | ORAL | Status: DC

## 2016-02-12 MED FILL — AMITRIPTYLINE HCL 25 MG TAB: 25 | 30 days supply | Qty: 30 | Fill #0

## 2016-02-12 NOTE — Progress Notes (Signed)
Quick Note:  Biopsies show fundic gland polyps and mild gastritis - not a problem/doubt any relation to sxs  Please call and let her know  I think trying amitriptyline 25 mg qhs can help with her dyspepsia - it can also help with insomnia rx # 30 2 RF and f/u 2 months please  No EGD recall  Can stop pantoprazole I think as does not seem to be helping her  [facility] - no letter/recall ______

## 2016-02-19 ENCOUNTER — Ambulatory Visit (INDEPENDENT_AMBULATORY_CARE_PROVIDER_SITE_OTHER): Payer: 59 | Admitting: Behavioral Health

## 2016-02-19 DIAGNOSIS — E538 Deficiency of other specified B group vitamins: Secondary | ICD-10-CM | POA: Diagnosis not present

## 2016-02-19 MED ORDER — CYANOCOBALAMIN 1000 MCG/ML IJ SOLN
1000.0000 ug | Freq: Once | INTRAMUSCULAR | Status: AC
  Administered 2016-02-19: 1000 ug via INTRAMUSCULAR

## 2016-02-19 NOTE — Progress Notes (Signed)
Pre visit review using our clinic review tool, if applicable. No additional management support is needed unless otherwise documented below in the visit note.  Patient in office today for B12 injection. IM given in Left Deltoid. Patient tolerated injection well. Next appointment scheduled for 03/18/16 at 3:45 PM.

## 2016-02-24 MED FILL — POTASSIUM CL ER 20 MEQ TABL: 20 | 30 days supply | Qty: 60 | Fill #4

## 2016-03-18 ENCOUNTER — Ambulatory Visit (INDEPENDENT_AMBULATORY_CARE_PROVIDER_SITE_OTHER): Payer: 59 | Admitting: *Deleted

## 2016-03-18 DIAGNOSIS — E538 Deficiency of other specified B group vitamins: Secondary | ICD-10-CM

## 2016-03-18 MED ORDER — CYANOCOBALAMIN 1000 MCG/ML IJ SOLN
1000.0000 ug | Freq: Once | INTRAMUSCULAR | Status: AC
  Administered 2016-03-18: 1000 ug via INTRAMUSCULAR

## 2016-03-18 NOTE — Progress Notes (Signed)
Pre visit review using our clinic review tool, if applicable. No additional management support is needed unless otherwise documented below in the visit note.  Pt tolerated injection well.   Next appt: 04/17/16  Dorrene German, RN

## 2016-03-20 ENCOUNTER — Encounter: Payer: Self-pay | Admitting: Family Medicine

## 2016-03-20 DIAGNOSIS — R5383 Other fatigue: Secondary | ICD-10-CM

## 2016-03-25 ENCOUNTER — Other Ambulatory Visit: Payer: Self-pay | Admitting: Family Medicine

## 2016-03-25 MED FILL — NUVARING VAGINAL RING: 0.12-0.015 | 84 days supply | Qty: 3 | Fill #0

## 2016-03-25 NOTE — Telephone Encounter (Signed)
Medication filled to pharmacy as requested.   

## 2016-03-26 ENCOUNTER — Ambulatory Visit (HOSPITAL_BASED_OUTPATIENT_CLINIC_OR_DEPARTMENT_OTHER): Payer: 59

## 2016-03-26 ENCOUNTER — Encounter: Payer: Self-pay | Admitting: Internal Medicine

## 2016-03-26 ENCOUNTER — Other Ambulatory Visit: Payer: Self-pay | Admitting: Family Medicine

## 2016-03-26 DIAGNOSIS — Z1231 Encounter for screening mammogram for malignant neoplasm of breast: Secondary | ICD-10-CM

## 2016-03-31 ENCOUNTER — Ambulatory Visit (HOSPITAL_BASED_OUTPATIENT_CLINIC_OR_DEPARTMENT_OTHER)
Admission: RE | Admit: 2016-03-31 | Discharge: 2016-03-31 | Disposition: A | Discharge status: Home / Self Care | Payer: 59 | Source: Ambulatory Visit | Attending: Family Medicine | Admitting: Family Medicine

## 2016-03-31 DIAGNOSIS — Z1231 Encounter for screening mammogram for malignant neoplasm of breast: Secondary | ICD-10-CM | POA: Insufficient documentation

## 2016-03-31 MED FILL — POTASSIUM CL ER 20 MEQ TABL: 20 | 30 days supply | Qty: 60 | Fill #5

## 2016-04-01 ENCOUNTER — Ambulatory Visit (INDEPENDENT_AMBULATORY_CARE_PROVIDER_SITE_OTHER): Payer: 59 | Admitting: Behavioral Health

## 2016-04-01 ENCOUNTER — Other Ambulatory Visit (INDEPENDENT_AMBULATORY_CARE_PROVIDER_SITE_OTHER): Payer: 59

## 2016-04-01 DIAGNOSIS — R5383 Other fatigue: Secondary | ICD-10-CM | POA: Diagnosis not present

## 2016-04-01 DIAGNOSIS — Z23 Encounter for immunization: Secondary | ICD-10-CM

## 2016-04-01 NOTE — Progress Notes (Signed)
Pre visit review using our clinic review tool, if applicable. No additional management support is needed unless otherwise documented below in the visit note.  Patient in office for Tetanus vaccine. Received verbal order from Elyn Aquas, PA-C to administer the vaccine. IM given in Left Deltoid. Patient tolerated injection well.

## 2016-04-02 LAB — BASIC METABOLIC PANEL
BUN: 10 mg/dL (ref 6–23)
CO2: 25 mEq/L (ref 19–32)
Calcium: 9.1 mg/dL (ref 8.4–10.5)
Chloride: 102 mEq/L (ref 96–112)
Creatinine, Ser: 0.63 mg/dL (ref 0.40–1.20)
GFR: 105.36 mL/min (ref >60.00)
Glucose, Bld: 80 mg/dL (ref 70–99)
Potassium: 3.7 mEq/L (ref 3.5–5.1)
Sodium: 136 mEq/L (ref 135–145)

## 2016-04-02 LAB — VITAMIN B12: Vitamin B-12: 521 pg/mL (ref 211–911)

## 2016-04-02 LAB — VITAMIN D 25 HYDROXY (VIT D DEFICIENCY, FRACTURES): VITD: 63.96 ng/mL (ref 30.00–100.00)

## 2016-04-14 MED FILL — LOSARTAN-HCTZ 50-12.5 MG TA: 50-12.5 | 90 days supply | Qty: 90 | Fill #1

## 2016-04-17 ENCOUNTER — Ambulatory Visit (INDEPENDENT_AMBULATORY_CARE_PROVIDER_SITE_OTHER): Payer: 59 | Admitting: Behavioral Health

## 2016-04-17 DIAGNOSIS — E538 Deficiency of other specified B group vitamins: Secondary | ICD-10-CM | POA: Diagnosis not present

## 2016-04-17 MED ORDER — CYANOCOBALAMIN 1000 MCG/ML IJ SOLN
1000.0000 ug | Freq: Once | INTRAMUSCULAR | Status: AC
  Administered 2016-04-17: 1000 ug via INTRAMUSCULAR

## 2016-04-17 NOTE — Progress Notes (Signed)
Pre visit review using our clinic review tool, if applicable. No additional management support is needed unless otherwise documented below in the visit note.  Patient in the office today for B12 injection. IM given in Left Deltoid. Patient tolerated injection well. Next appointment scheduled for 05/19/16 at 9:00 AM.

## 2016-05-03 ENCOUNTER — Encounter: Payer: Self-pay | Admitting: Family Medicine

## 2016-05-04 MED FILL — POTASSIUM CL ER 20 MEQ TABL: 20 | 30 days supply | Qty: 60 | Fill #6

## 2016-05-06 ENCOUNTER — Other Ambulatory Visit: Payer: Self-pay | Admitting: Family Medicine

## 2016-05-06 MED ORDER — FLUCONAZOLE 150 MG PO TABS: 150.0000 mg | ORAL_TABLET | Freq: Once | ORAL | Status: DC

## 2016-05-06 MED FILL — FLUCONAZOLE 150 MG TABLET: 150 | 1 days supply | Qty: 1 | Fill #0

## 2016-05-19 ENCOUNTER — Ambulatory Visit (INDEPENDENT_AMBULATORY_CARE_PROVIDER_SITE_OTHER): Payer: 59 | Admitting: *Deleted

## 2016-05-19 DIAGNOSIS — E538 Deficiency of other specified B group vitamins: Secondary | ICD-10-CM

## 2016-05-19 MED ORDER — CYANOCOBALAMIN 1000 MCG/ML IJ SOLN
1000.0000 ug | Freq: Once | INTRAMUSCULAR | Status: AC
  Administered 2016-05-19: 1000 ug via INTRAMUSCULAR

## 2016-05-19 NOTE — Progress Notes (Signed)
Pre visit review using our clinic review tool, if applicable. No additional management support is needed unless otherwise documented below in the visit note.  Patient tolerated injection well.  Next appointment: 06/17/16  Dorrene German, RN

## 2016-05-22 ENCOUNTER — Encounter: Payer: Self-pay | Admitting: Internal Medicine

## 2016-05-25 ENCOUNTER — Encounter: Payer: Self-pay | Admitting: Family Medicine

## 2016-06-08 ENCOUNTER — Encounter: Payer: Self-pay | Admitting: Family Medicine

## 2016-06-08 ENCOUNTER — Ambulatory Visit (INDEPENDENT_AMBULATORY_CARE_PROVIDER_SITE_OTHER): Payer: 59 | Admitting: Family Medicine

## 2016-06-08 VITALS — BP 114/74 | HR 79 | Temp 98.1°F | Resp 16 | Ht 61.0 in | Wt 139.1 lb

## 2016-06-08 DIAGNOSIS — R413 Other amnesia: Secondary | ICD-10-CM

## 2016-06-08 LAB — CBC WITH DIFFERENTIAL/PLATELET
Basophils Absolute: 0 10*3/uL (ref 0.0–0.1)
Basophils Relative: 0.6 % (ref 0.0–3.0)
Eosinophils Absolute: 0.1 10*3/uL (ref 0.0–0.7)
Eosinophils Relative: 1 % (ref 0.0–5.0)
HCT: 42.5 % (ref 36.0–46.0)
Hemoglobin: 14.2 g/dL (ref 12.0–15.0)
Lymphocytes Relative: 37 % (ref 12.0–46.0)
Lymphs Abs: 2.9 10*3/uL (ref 0.7–4.0)
MCHC: 33.4 g/dL (ref 30.0–36.0)
MCV: 90.6 fl (ref 78.0–100.0)
Monocytes Absolute: 0.4 10*3/uL (ref 0.1–1.0)
Monocytes Relative: 5.7 % (ref 3.0–12.0)
Neutro Abs: 4.3 10*3/uL (ref 1.4–7.7)
Neutrophils Relative %: 55.7 % (ref 43.0–77.0)
Platelets: 362 10*3/uL (ref 150.0–400.0)
RBC: 4.69 Mil/uL (ref 3.87–5.11)
RDW: 13.3 % (ref 11.5–15.5)
WBC: 7.8 10*3/uL (ref 4.0–10.5)

## 2016-06-08 LAB — TSH: TSH: 1.83 u[IU]/mL (ref 0.35–4.50)

## 2016-06-08 LAB — BASIC METABOLIC PANEL
BUN: 10 mg/dL (ref 6–23)
CO2: 23 mEq/L (ref 19–32)
Calcium: 9.7 mg/dL (ref 8.4–10.5)
Chloride: 103 mEq/L (ref 96–112)
Creatinine, Ser: 0.67 mg/dL (ref 0.40–1.20)
GFR: 98.06 mL/min (ref >60.00)
Glucose, Bld: 85 mg/dL (ref 70–99)
Potassium: 4.1 mEq/L (ref 3.5–5.1)
Sodium: 137 mEq/L (ref 135–145)

## 2016-06-08 LAB — B12 AND FOLATE PANEL
Folate: 10.5 ng/mL (ref >5.9)
Vitamin B-12: 402 pg/mL (ref 211–911)

## 2016-06-08 MED ORDER — BUPROPION HCL ER (XL) 150 MG PO TB24: 150.0000 mg | ORAL_TABLET | Freq: Every day | ORAL | 3 refills | Status: DC

## 2016-06-08 MED FILL — BUPROPION HCL XL 150 MG TAB: 150 | 30 days supply | Qty: 30 | Fill #0

## 2016-06-08 NOTE — Progress Notes (Signed)
Pre visit review using our clinic review tool, if applicable. No additional management support is needed unless otherwise documented below in the visit note. 

## 2016-06-08 NOTE — Progress Notes (Signed)
   Subjective:    Patient ID: Nicole Crosby, female    DOB: 1966/09/04, 50 y.o.   MRN: [id number]  HPI Memory loss- sxs started late 2016.  Pt has forgotten children's names, how to spell her first name.  Having difficulty processing information- confused start times.  Initially thought it was due to B12 deficiency but getting regular injxns.  Pt is tearful about her difficulties.  Denies feeling overwhelmed.  Pt admits to feeling sad, 'kinda down'.  Having difficulty in marriage.  + low libido.   Review of Systems For ROS see HPI     Objective:   Physical Exam  Constitutional: She is oriented to person, place, and time. She appears well-developed and well-nourished. No distress.  HENT:  Head: Normocephalic and atraumatic.  Neurological: She is alert and oriented to person, place, and time.  Skin: Skin is warm and dry.  Psychiatric: She has a normal mood and affect. Her behavior is normal. Thought content normal.  Almost tearful while discussing memory issues  Vitals reviewed.         Assessment & Plan:  Memory issues- new.  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  She admits that this could be the cause.  Check labs to r/o underlying metabolic issue as cause for memory loss.  Start low dose Wellbutrin to improve anxiety and libido.  Will follow closely.  Pt expressed understanding and is in agreement w/ plan.

## 2016-06-08 NOTE — Patient Instructions (Signed)
Follow up in 3-4 weeks to recheck memory and mood We'll notify you of your lab results and make any changes if needed Start the Wellbutrin once daily (in the AM) for anxiety.  This will likely help the memory issues Call with any questions or concerns Hang in there!!!

## 2016-06-09 ENCOUNTER — Other Ambulatory Visit: Payer: Self-pay | Admitting: Family Medicine

## 2016-06-09 MED FILL — POTASSIUM CL ER 20 MEQ TABL: 20 | 30 days supply | Qty: 60 | Fill #0

## 2016-06-15 MED FILL — NUVARING VAGINAL RING: 0.12-0.015 | 84 days supply | Qty: 3 | Fill #1

## 2016-06-17 ENCOUNTER — Ambulatory Visit (INDEPENDENT_AMBULATORY_CARE_PROVIDER_SITE_OTHER): Payer: 59 | Admitting: *Deleted

## 2016-06-17 ENCOUNTER — Ambulatory Visit: Payer: 59

## 2016-06-17 DIAGNOSIS — E538 Deficiency of other specified B group vitamins: Secondary | ICD-10-CM | POA: Diagnosis not present

## 2016-06-17 MED ORDER — CYANOCOBALAMIN 1000 MCG/ML IJ SOLN
1000.0000 ug | Freq: Once | INTRAMUSCULAR | Status: AC
  Administered 2016-06-17: 1000 ug via INTRAMUSCULAR

## 2016-06-17 NOTE — Progress Notes (Signed)
Pre visit review using our clinic review tool, if applicable. No additional management support is needed unless otherwise documented below in the visit note.  Patient tolerated injection well.  Next appointment: 07/15/16  Dorrene German, RN

## 2016-06-18 ENCOUNTER — Encounter: Payer: Self-pay | Admitting: Family Medicine

## 2016-06-21 ENCOUNTER — Encounter: Payer: Self-pay | Admitting: Internal Medicine

## 2016-06-29 ENCOUNTER — Encounter: Payer: Self-pay | Admitting: Family Medicine

## 2016-06-29 ENCOUNTER — Ambulatory Visit (INDEPENDENT_AMBULATORY_CARE_PROVIDER_SITE_OTHER): Payer: 59 | Admitting: Family Medicine

## 2016-06-29 VITALS — BP 121/83 | HR 88 | Temp 98.1°F | Resp 16 | Ht 61.0 in | Wt 137.5 lb

## 2016-06-29 DIAGNOSIS — R42 Dizziness and giddiness: Secondary | ICD-10-CM | POA: Diagnosis not present

## 2016-06-29 DIAGNOSIS — R413 Other amnesia: Secondary | ICD-10-CM | POA: Diagnosis not present

## 2016-06-29 NOTE — Progress Notes (Signed)
Pre visit review using our clinic review tool, if applicable. No additional management support is needed unless otherwise documented below in the visit note. 

## 2016-06-29 NOTE — Patient Instructions (Signed)
Follow up as needed Continue the Meclizine as needed Drink plenty of fluids b/c dizziness is worse w/ dehydration Turn head slowly as this may have a positional component We'll call you with your Neuro appt Call with any questions or concerns Hang in there!  We'll figure this out!!!

## 2016-06-29 NOTE — Progress Notes (Signed)
   Subjective:    Patient ID: Nicole Crosby, female    DOB: 1966-08-13, 50 y.o.   MRN: [id number]  HPI Mood/memory- had to stop Wellbutrin after 3 days due to severe headaches.  Pt reports she has had 2 episodes where she feels 'disoriented'- pt reports she was in the car w/ her husband and while he was driving she thought they were in her car rather than his SUV.  Pt also reports dizziness, 'i forgot to tell you about it last time I was here'.  sxs started 'several months ago'.  Pt is taking Meclizine ~3x week.  Denies spinning or vertigo sensation but 'swimmy headed'.  Is not associated w/ turning her head.  Improves w/ meclizine.  Pt had MRI done 2 yrs ago for similar sxs- 'confusion, memory difficulties'.  MRI was normal.   Review of Systems For ROS see HPI     Objective:   Physical Exam  Constitutional: She is oriented to person, place, and time. She appears well-developed and well-nourished. No distress.  HENT:  Head: Normocephalic and atraumatic.  Mouth/Throat: Uvula is midline and mucous membranes are normal.  TMs WNL No TTP over sinuses Minimal nasal congestion  Eyes: Conjunctivae and EOM are normal. Pupils are equal, round, and reactive to light.  2-3 beats of horizontal nystagmus when looking L  Neck: Normal range of motion. Neck supple.  Cardiovascular: Normal rate, regular rhythm, normal heart sounds and intact distal pulses.   Pulmonary/Chest: Effort normal and breath sounds normal. No respiratory distress. She has no wheezes. She has no rales.  Musculoskeletal: She exhibits no edema.  Lymphadenopathy:    She has no cervical adenopathy.  Neurological: She is alert and oriented to person, place, and time. She has normal reflexes. No cranial nerve deficit.  Skin: Skin is warm and dry.  Psychiatric: She has a normal mood and affect. Her behavior is normal. Judgment and thought content normal.  Vitals reviewed.         Assessment & Plan:  Dizziness- new to provider,  ongoing for pt.  She reports that she saw [facility] in the past and participated in vestibular rehab but was told, 'there's nothing wrong with your vestibular system'.  Today's PE appears to contradict that statement as she has 3 beats of horizontal nystagmus when looking L.  Continue Meclizine PRN.  Refer to neuro for complete evaluation.    Memory issues- pt reports this continues to be a problem for her and she is worried that this is more than anxiety.  She had normal MRI 2 yrs ago.  She was not able to tolerate the Wellbutrin due to severe headaches.  At this time, will refer to neuro given her concerns.  Pt expressed understanding and is in agreement w/ plan.

## 2016-07-12 ENCOUNTER — Other Ambulatory Visit: Payer: Self-pay | Admitting: Family Medicine

## 2016-07-13 MED ORDER — FLUCONAZOLE 150 MG PO TABS: 150.0000 mg | ORAL_TABLET | Freq: Once | ORAL | 0 refills | Status: AC

## 2016-07-13 MED FILL — FLUCONAZOLE 150 MG TABLET: 150 | 1 days supply | Qty: 1 | Fill #0

## 2016-07-14 ENCOUNTER — Other Ambulatory Visit: Payer: Self-pay | Admitting: Family Medicine

## 2016-07-14 MED FILL — POTASSIUM CL ER 20 MEQ TABL: 20 | 30 days supply | Qty: 60 | Fill #1

## 2016-07-14 MED FILL — LOSARTAN-HCTZ 50-12.5 MG TA: 50-12.5 | 90 days supply | Qty: 90 | Fill #0

## 2016-07-15 ENCOUNTER — Ambulatory Visit (INDEPENDENT_AMBULATORY_CARE_PROVIDER_SITE_OTHER): Payer: 59 | Admitting: *Deleted

## 2016-07-15 ENCOUNTER — Ambulatory Visit: Payer: 59

## 2016-07-15 DIAGNOSIS — E538 Deficiency of other specified B group vitamins: Secondary | ICD-10-CM | POA: Diagnosis not present

## 2016-07-15 MED ORDER — CYANOCOBALAMIN 1000 MCG/ML IJ SOLN
1000.0000 ug | Freq: Once | INTRAMUSCULAR | Status: AC
  Administered 2016-07-15: 1000 ug via INTRAMUSCULAR

## 2016-07-15 NOTE — Progress Notes (Signed)
Pre visit review using our clinic review tool, if applicable. No additional management support is needed unless otherwise documented below in the visit note.  Patient tolerated injection well.  Next appointment: 08/12/16  Dorrene German, RN

## 2016-07-20 DIAGNOSIS — H524 Presbyopia: Secondary | ICD-10-CM | POA: Diagnosis not present

## 2016-07-20 DIAGNOSIS — H40013 Open angle with borderline findings, low risk, bilateral: Secondary | ICD-10-CM | POA: Diagnosis not present

## 2016-07-21 MED FILL — RESTASIS 0.05% EYE EMULSION: 0.05 | 90 days supply | Qty: 180 | Fill #0

## 2016-08-12 ENCOUNTER — Ambulatory Visit (INDEPENDENT_AMBULATORY_CARE_PROVIDER_SITE_OTHER): Payer: 59 | Admitting: Behavioral Health

## 2016-08-12 DIAGNOSIS — E538 Deficiency of other specified B group vitamins: Secondary | ICD-10-CM | POA: Diagnosis not present

## 2016-08-12 MED ORDER — CYANOCOBALAMIN 1000 MCG/ML IJ SOLN
1000.0000 ug | Freq: Once | INTRAMUSCULAR | Status: AC
  Administered 2016-08-12: 1000 ug via INTRAMUSCULAR

## 2016-08-12 NOTE — Progress Notes (Signed)
Pre visit review using our clinic review tool, if applicable. No additional management support is needed unless otherwise documented below in the visit note.  Patient in clinic today for B12 injection. IM given in the Left Deltoid. Patient tolerated injection well.  Next appointment scheduled for 09/16/16 at 3:45 PM.

## 2016-08-28 ENCOUNTER — Ambulatory Visit (INDEPENDENT_AMBULATORY_CARE_PROVIDER_SITE_OTHER): Payer: 59 | Admitting: Neurology

## 2016-08-28 ENCOUNTER — Encounter: Payer: Self-pay | Admitting: Neurology

## 2016-08-28 VITALS — BP 128/76 | HR 97 | Ht 61.0 in | Wt 140.1 lb

## 2016-08-28 DIAGNOSIS — R42 Dizziness and giddiness: Secondary | ICD-10-CM | POA: Insufficient documentation

## 2016-08-28 DIAGNOSIS — R413 Other amnesia: Secondary | ICD-10-CM

## 2016-08-28 NOTE — Progress Notes (Signed)
NEUROLOGY CONSULTATION NOTE  Nicole Crosby MRN: [id number] DOB: [birth year]  Referring provider: Dr. Annye Asa Primary care provider:  Dr. Annye Asa  Reason for consult:  Headaches, memory loss, dizziness  Dear Dr Birdie Riddle:  Thank you for your kind referral of Nicole Crosby for consultation of the above symptoms. Although her history is well known to you, please allow me to reiterate it for the purpose of our medical record. Records and images were personally reviewed where available.  HISTORY OF PRESENT ILLNESS: This is a 50 year old right-handed woman with a history of hypertension, GERD, presenting for evaluation of headaches, memory loss, and dizziness. She reports the headaches were occurring frequently when she was taking medications for her reflux (Nexium, Prilosec). With stopping these, the headaches have significantly improved. They only seldom occur. She usually now just takes TUMS or rarely prn Famotidine. She reports that she does not have vertigo, but more of a "loopy sensation" which occurs only when she is sitting down or reading. She would feel like she is on a boat or like someone is trying to push her over. No associated headache, nausea/vomiting, focal symptoms. She would usually take a meclizine which seems to help. They do not seem to occur when she is supine or standing. She was having these symptoms 3-4 times a week in February, but this has improved as well. She had sensations of dysequilibrium when walking around 5 years ago and had vestibular therapy, but was told symptoms were not due to vestibular dysfunction. She has noticed some changes in her hearing but missed her hearing test last April. No tinnitus. Her main concern today is the memory change that started in the Fall of last year, but worse in February 2017. It appears she had reported memory changes in 2015 and had an MRI brain with and without contrast which I personally reviewed, which was  normal. She has noticed that her brain is not processing things correctly. She asked to see a neurologist after she forgot about a work appointment last August. She had noticed that when she was taking Prilosec, she was definitely more confused, forgetting coworkers names, her address, using her maiden name when she had been married for 20+ years. This did improved when she stopped Prilosec, but she continues to notice some changes. Co-workers and her daughter has mentioned as well that she would repeat herself. She denies getting lost driving, no missed bill payments or medications. She denies misplacing things frequently and has not left the stove on. She has occasional difficulties multitasking. She noticed she has to lean back on her car's head rest to "avoid being loopy." She had numbness and tingling in her hands and feet in February and was found to have a low B12 level. She is now on monthly injections, most recent B12 level in August 2017 was 402. Paresthesias are better.  She denies any diplopia, dysarthria, dysphagia, back pain, focal numbness/tingling/weakness, bowel/bladder dysfunction. She has chronic neck pain. Her mother may have dementia. She denies any significant head injuries, she drinks alcohol occasionally.   Laboratory Data: Lab Results  Component Value Date   WBC 7.8 06/08/2016   HGB 14.2 06/08/2016   HCT 42.5 06/08/2016   MCV 90.6 06/08/2016   PLT 362.0 06/08/2016     Chemistry      Component Value Date/Time   NA 137 06/08/2016 0901   K 4.1 06/08/2016 0901   CL 103 06/08/2016 0901   CO2 23 06/08/2016 0901  BUN 10 06/08/2016 0901   CREATININE 0.67 06/08/2016 0901   CREATININE 0.71 05/15/2013 0920      Component Value Date/Time   CALCIUM 9.7 06/08/2016 0901   ALKPHOS 45 12/17/2015 0854   AST 14 12/17/2015 0854   ALT 12 12/17/2015 0854   BILITOT 0.3 12/17/2015 0854     Lab Results  Component Value Date   TSH 1.83 06/08/2016   Lab Results  Component Value  Date   VITAMINB12 402 06/08/2016     PAST MEDICAL HISTORY: Past Medical History:  Diagnosis Date  . Allergy    Buspirone, Sulfa, Pantoprazole  . Benign fundic gland polyps of stomach 02/12/2016  . Chicken pox   . DYSTHYMIA 09/30/2009  . Functional dyspepsia   . GERD (gastroesophageal reflux disease)   . H/O adenomatous polyp of colon 10/04/2014   09/2014 - 3 mm adenoma - repeat colonoscopy 2022  . Hyperlipidemia    no meds  . Hypertension    pt denies  . IBS (irritable bowel syndrome)   . Insomnia   . MEDIAL EPICONDYLITIS, RIGHT 07/17/2010  . 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 Other abnormal glucose    Tiny hypodensity left hepatic lobe-CT of abdomen and pelvis 99991111 71mm periumblical hernia  . PAC (premature atrial contraction)   . Palpitations     PAST SURGICAL HISTORY: Past Surgical History:  Procedure Laterality Date  . CESAREAN SECTION  01/2001  . COLONOSCOPY    . DE QUERVAIN'S RELEASE  2000   Right wrist area    MEDICATIONS: Current Outpatient Prescriptions on File Prior to Visit  Medication Sig Dispense Refill  . Calcium Carbonate-Vit D-Min (CALCIUM 1200 PO) Take by mouth.    . cholecalciferol (VITAMIN D) 1000 UNITS tablet Take 1,000 Units by mouth daily.    Marland Kitchen dicyclomine (BENTYL) 20 MG tablet TAKE 1 TABLET (20 MG TOTAL) BY MOUTH UPTO 3 TIMES A DAY AS NEEDED 60 tablet 3  . hyoscyamine (LEVSIN SL) 0.125 MG SL tablet Place 1 tablet (0.125 mg total) under the tongue every 4 (four) hours as needed. 60 tablet 2  . losartan-hydrochlorothiazide (HYZAAR) 50-12.5 MG tablet TAKE 1 TABLET BY MOUTH DAILY. 90 tablet 1  . meclizine (ANTIVERT) 25 MG tablet Take 25 mg by mouth 3 (three) times daily as needed for dizziness.    Marland Kitchen NUVARING 0.12-0.015 MG/24HR vaginal ring INSERT 1 RING VAGINALLY AND LEAVE IN PLACE FOR 3 CONSECUTIVE WEEKS, THEN REMOVE FOR 1 WEEK. 1 each 11  . potassium chloride SA (K-DUR,KLOR-CON) 20 MEQ tablet TAKE 1 TABLET BY MOUTH TWICE DAILY 60 tablet 6    No current facility-administered medications on file prior to visit.     ALLERGIES: Allergies  Allergen Reactions  . Buspirone Other (See Comments)     nightmare  . Lansoprazole     Abdominal pain  . Omeprazole     Headache  . Sulfonamide Derivatives Rash     Rash    FAMILY HISTORY: Family History  Problem Relation Age of Onset  . Hypertension Mother   . Hyperlipidemia Mother   . Fibrocystic breast disease Mother   . Hearing loss Mother   . Osteopenia Mother   . Prostate cancer Father 5    deceased  . Hypertension Father   . Diabetes Father   . Kidney disease Father   . Heart disease Father     MI  . Hyperlipidemia Father   . Other Brother     unknown  . Thyroid disease Sister   .  Diabetes Sister     pre-diabetes  . Hypertension Sister   . Arthritis Sister   . Diabetes Sister   . Hypertension Sister   . Hyperlipidemia Sister   . Fibrocystic breast disease Sister   . Colon cancer Maternal Uncle     died in late 14's  . Diabetes Maternal Grandmother   . Prostate cancer Maternal Grandfather   . COPD Maternal Grandfather   . Hyperlipidemia Sister   . Hypertension Sister   . Rectal cancer Neg Hx   . Stomach cancer Neg Hx   . Esophageal cancer Neg Hx   . Pancreatic cancer Neg Hx     SOCIAL HISTORY: Social History   Social History  . Marital status: Married    Spouse name: N/A  . Number of children: 2  . Years of education: N/A   Occupational History  . Xray/CT Tech [employer]   Social History Main Topics  . Smoking status: Never Smoker  . Smokeless tobacco: Never Used  . Alcohol use 0.6 oz/week    1 Glasses of wine per week     Comment: occasional  . Drug use: No  . Sexual activity: Yes    Birth control/ protection: Inserts   Other Topics Concern  . 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    Alcohol Use - yes-wine         Full Time    Married            REVIEW OF  SYSTEMS: Constitutional: No fevers, chills, or sweats, no generalized fatigue, change in appetite Eyes: No visual changes, double vision, eye pain Ear, nose and throat: No hearing loss, ear pain, nasal congestion, sore throat Cardiovascular: No chest pain, palpitations Respiratory:  No shortness of breath at rest or with exertion, wheezes GastrointestinaI: No nausea, vomiting, diarrhea, abdominal pain, fecal incontinence Genitourinary:  No dysuria, urinary retention or frequency Musculoskeletal:  + neck pain,no back pain Integumentary: No rash, pruritus, skin lesions Neurological: as above Psychiatric: No depression, insomnia, anxiety Endocrine: No palpitations, fatigue, diaphoresis, mood swings, change in appetite, change in weight, increased thirst Hematologic/Lymphatic:  No anemia, purpura, petechiae. Allergic/Immunologic: no itchy/runny eyes, nasal congestion, recent allergic reactions, rashes  PHYSICAL EXAM: Vitals:   08/28/16 0848  BP: 128/76  Pulse: 97   General: No acute distress Head:  Normocephalic/atraumatic Eyes: Fundoscopic exam shows bilateral sharp discs, no vessel changes, exudates, or hemorrhages Neck: supple, no paraspinal tenderness, full range of motion Back: No paraspinal tenderness Heart: regular rate and rhythm Lungs: Clear to auscultation bilaterally. Vascular: No carotid bruits. Skin/Extremities: No rash, no edema Neurological Exam: Mental status: alert and oriented to person, place, and time, no dysarthria or aphasia, Fund of knowledge is appropriate.  Recent and remote memory are intact.  Attention and concentration are normal.    Able to name objects and repeat phrases.  Montreal Cognitive Assessment  08/28/2016  Visuospatial/ Executive (0/5) 5  Naming (0/3) 3  Attention: Read list of digits (0/2) 2  Attention: Read list of letters (0/1) 1  Attention: Serial 7 subtraction starting at 100 (0/3) 3  Language: Repeat phrase (0/2) 2  Language : Fluency  (0/1) 1  Abstraction (0/2) 2  Delayed Recall (0/5) 5  Orientation (0/6) 6  Total 30  Adjusted Score (based on education) 30   Cranial nerves: CN I: not tested CN II: pupils equal, round and reactive to light,  visual fields intact, fundi unremarkable. CN III, IV, VI:  full range of motion, no nystagmus, no ptosis CN V: facial sensation intact CN VII: upper and lower face symmetric CN VIII: hearing intact to finger rub CN IX, X: gag intact, uvula midline CN XI: sternocleidomastoid and trapezius muscles intact CN XII: tongue midline Bulk & Tone: normal, no fasciculations. Motor: 5/5 throughout with no pronator drift. Sensation: intact to light touch, cold, pin, vibration and joint position sense.  No extinction to double simultaneous stimulation.  Romberg test negative Deep Tendon Reflexes: +2 throughout, no ankle clonus Plantar responses: downgoing bilaterally Cerebellar: no incoordination on finger to nose, heel to shin. No dysdiadochokinesia Gait: narrow-based and steady, able to tandem walk adequately. Tremor: none  IMPRESSION: This is a 50 year old right-handed woman with a history of hypertension, B12 deficiency, GERD, presenting for evaluation of headaches, dysequilibrium, and memory changes. The headaches are pretty much resolved/improved with stopping reflux medications. She continues to have brief episodes of dysequilibrium when sitting, etiology unclear, suggestive of possible vestibular cause but she had vestibular rehab in the past and was told it was not this. Her MRI brain in 2015 for similar symptoms did not show any intracranial abnormality. Agree with proceeding with hearing test. We discussed non-specific dizziness and symptomatic treatment, sometimes SSRIs or TCAs may help. She will keep a calendar and we can try doing preventative medication if they become more frequent or bothersome. For now, she will continue prn meclizine. Her main concern today is the memory change,  MOCA score normal, neurological exam non-focal. We discussed different causes of memory change, including TSH, B12, and psychological cause. B12 is now normal. Neuropsychological evaluation will be ordered to further delineate her symptoms. She will keep a calendar of her symptoms and follow-up in 3 months.   Thank you for allowing me to participate in the care of this patient. Please do not hesitate to call for any questions or concerns.   Ellouise Newer, M.D.  CC: Dr. Birdie Riddle

## 2016-08-28 NOTE — Patient Instructions (Signed)
1. Schedule Neurocognitive testing with Dr. Si Raider 2. Proceed with hearing test 3. Keep a calendar of your symptoms 4. Follow-up in 3 months, call for any changes

## 2016-08-31 MED FILL — POTASSIUM CL ER 20 MEQ TABL: 20 | 30 days supply | Qty: 60 | Fill #2

## 2016-09-07 MED FILL — NUVARING VAGINAL RING: 0.12-0.015 | 84 days supply | Qty: 3 | Fill #2

## 2016-09-08 ENCOUNTER — Encounter: Payer: Self-pay | Admitting: Internal Medicine

## 2016-09-09 ENCOUNTER — Telehealth: Payer: Self-pay

## 2016-09-09 NOTE — Telephone Encounter (Signed)
Left message for patient to call back  

## 2016-09-09 NOTE — Telephone Encounter (Signed)
-----   Message from Gatha Mayer, MD sent at 09/08/2016  5:34 PM EST ----- Regarding: appt See My Chart correspondence but see if I can see her somewhere this month - Dec probably ok also if not possible

## 2016-09-10 ENCOUNTER — Encounter: Payer: Self-pay | Admitting: Family Medicine

## 2016-09-10 MED ORDER — FLUCONAZOLE 150 MG PO TABS: 150.0000 mg | ORAL_TABLET | Freq: Once | ORAL | 0 refills | Status: AC

## 2016-09-10 MED FILL — FLUCONAZOLE 150 MG TABLET: 150 | 1 days supply | Qty: 1 | Fill #0

## 2016-09-10 NOTE — Telephone Encounter (Signed)
Left message for patient to call back  

## 2016-09-14 NOTE — Telephone Encounter (Signed)
Left message for patient to call back if she is interested in scheduling an appt.

## 2016-09-15 ENCOUNTER — Telehealth: Payer: Self-pay | Admitting: Internal Medicine

## 2016-09-15 NOTE — Telephone Encounter (Signed)
Patient scheduled for 10/06/16 3:30

## 2016-09-16 ENCOUNTER — Ambulatory Visit (INDEPENDENT_AMBULATORY_CARE_PROVIDER_SITE_OTHER): Payer: 59

## 2016-09-16 DIAGNOSIS — E538 Deficiency of other specified B group vitamins: Secondary | ICD-10-CM | POA: Diagnosis not present

## 2016-09-16 MED ORDER — CYANOCOBALAMIN 1000 MCG/ML IJ SOLN
1000.0000 ug | Freq: Once | INTRAMUSCULAR | Status: AC
  Administered 2016-09-16: 1000 ug via INTRAMUSCULAR

## 2016-09-16 NOTE — Progress Notes (Signed)
Pre visit review using our clinic tool,if applicable. No additional management support is needed unless otherwise documented below in the visit note.   Patient in for B12 injection per order from Dr. Birdie Riddle.. Given IM Left Deltoid.

## 2016-09-29 ENCOUNTER — Encounter: Payer: Self-pay | Admitting: Psychology

## 2016-09-29 ENCOUNTER — Ambulatory Visit (INDEPENDENT_AMBULATORY_CARE_PROVIDER_SITE_OTHER): Payer: 59 | Admitting: Psychology

## 2016-09-29 DIAGNOSIS — R413 Other amnesia: Secondary | ICD-10-CM | POA: Diagnosis not present

## 2016-09-29 NOTE — Progress Notes (Signed)
NEUROPSYCHOLOGICAL INTERVIEW (CPT: [id number])  Name: Nicole Crosby Date of Birth: 06-08-1966 Date of Interview: 09/29/2016  Reason for Referral:  Nicole Crosby is a 50 y.o., right-handed female who is referred for neuropsychological evaluation by Dr. Ellouise Newer of [facility] due to concerns about memory loss. This patient is unaccompanied in the office for today's visit.  History of Presenting Problem:  Nicole Crosby reported onset of cognitive difficulties around last summer (2016) but she became more concerned in February of this year. She reported that she saw Dr. Birdie Riddle and told her she was having difficulty processing things, maybe because she had too much on her mind. Dr. Birdie Riddle prescribed Wellbutrin but the patient had adverse effects she so she d/c it. Nicole Crosby saw Dr. Delice Crosby on 08/28/2016 and at that time the patient reported that her cognitive functioning was worse when she was on Prilosec but had improved somewhat since stopping that medication. Neurologic exam was non-focal and MOCA was 30/30. Today the patient reported that she thinks the combination of interferon and B12 may have contributed to cognitive difficulties. She was started on B12 injections; she is not sure if this helped cognitive functioning at all.  The patient reported that she is having difficulty processing visual information. For example, she will look at a calendar and see an appointment scheduled at 2 pm but she will process it as 3 pm. 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 She questions whether she is following written instructions correctly. Upon direct questioning, she also endorses forgetfulness for recent conversations/events, repeating questions/statements, increased spelling difficulty, and on a few occasions feeling uncertain about directions while driving. However, she has not gotten lost. She denies forgetfulness for autobiographical information or salient remote  events. She denies frequent misplacing of items, forgetting to take medications, distractibility, or word finding difficulty.  She noted that she when she had a hearing test back in 2004 she was told hearing wasn't an issue but she had a problem with auditory processing. She has had hearing evaluations regularly since then; her next one is scheduled for tomorrow.  She denied history of head injury or loss of consciousness.   The patient denied history of mental health issues including depression or anxiety. She has never been treated for a psychiatric disorder. She denied history of substance abuse or dependence.  There is no family history of dementia. Her 108 year old mother may have some memory loss, unclear if normal for age.    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 She independently manages all complex ADLs. She denied significant difficulty with driving, medications, or cooking. She manages the family finances and did forget to write a check down on one occasion; she also over-paid her homeowner's insurance.  Physically, the patient reported some GI problems, low energy, paresthesias in her left arm and both hands. She also endorsed some difficulty with balance. She had a vestibular evaluation in the past which was reportedly normal. She takes meclizine as needed for a dizzy/off balance sensation she sometimes has when sitting and reading. She has had to take it three times since her visit with Dr. Delice Crosby a month ago.  When asked about her current mood, the patient reported current work-related stress which is bothering her. She has some difficulty falling asleep, usually because she has too much on her mind. She reported change in appetite which she feels is related to her GI issues. She  has less energy during the day and she is taking more naps. She denied suicidal ideation or intention.   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 Marital history: Married x24 years. Two children, ages 65 and 5. Alcohol/Tobacco/Substances: Occasional glass of wine, never a smoker, no SA.   Medical History: Past Medical History:  Diagnosis Date  . Allergy    Buspirone, Sulfa, Pantoprazole  . Benign fundic gland polyps of stomach 02/12/2016  . Chicken pox   . DYSTHYMIA 09/30/2009  . Functional dyspepsia   . GERD (gastroesophageal reflux disease)   . H/O adenomatous polyp of colon 10/04/2014   09/2014 - 3 mm adenoma - repeat colonoscopy 2022  . Hyperlipidemia    no meds  . Hypertension    pt denies  . IBS (irritable bowel syndrome)   . Insomnia   . MEDIAL EPICONDYLITIS, RIGHT 07/17/2010  . 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 Other abnormal glucose    Tiny hypodensity left hepatic lobe-CT of abdomen and pelvis 99991111 71mm periumblical hernia  . PAC (premature atrial contraction)   . Palpitations     Current Medications:  Outpatient Encounter Prescriptions as of 09/29/2016  Medication Sig  . Calcium Carbonate-Vit D-Min (CALCIUM 1200 PO) Take by mouth.  . cholecalciferol (VITAMIN D) 1000 UNITS tablet Take 1,000 Units by mouth daily.  Marland Kitchen dicyclomine (BENTYL) 20 MG tablet TAKE 1 TABLET (20 MG TOTAL) BY MOUTH UPTO 3 TIMES A DAY AS NEEDED  . hyoscyamine (LEVSIN SL) 0.125 MG SL tablet Place 1 tablet (0.125 mg total) under the tongue every 4 (four) hours as needed.  Marland Kitchen losartan-hydrochlorothiazide (HYZAAR) 50-12.5 MG tablet TAKE 1 TABLET BY MOUTH DAILY.  . meclizine (ANTIVERT) 25 MG tablet Take 25 mg by mouth 3 (three) times daily as needed for dizziness.  Marland Kitchen NUVARING 0.12-0.015 MG/24HR vaginal ring INSERT 1 RING VAGINALLY AND LEAVE IN PLACE FOR 3 CONSECUTIVE WEEKS, THEN REMOVE FOR 1 WEEK.  . potassium chloride SA (K-DUR,KLOR-CON) 20 MEQ tablet TAKE 1 TABLET BY MOUTH TWICE DAILY   No facility-administered  encounter medications on file as of 09/29/2016.     Behavioral Observations:   Appearance: Neatly and appropriately dressed and groomed Gait: Ambulated independently, no abnormalities observed Speech: Fluent; normal rate, rhythm and volume. Increased response latencies. Thought process: Linear, goal directed Affect: Blunted Interpersonal: Pleasant, appropriate   TESTING: There is medical necessity to proceed with neuropsychological assessment as the results will be used to aid in differential diagnosis and clinical decision-making and to inform specific treatment recommendations. Per the patient and medical records reviewed, there has been a change in cognitive functioning and a reasonable suspicion of neurocognitive disorder.   PLAN: The patient will return for a full battery of neuropsychological testing with a psychometrician under my supervision. Education regarding testing procedures was provided. Subsequently, the patient will see this provider for a follow-up session at which time her test performances and my impressions and treatment recommendations will be reviewed in detail.   Full neuropsychological evaluation report to follow.

## 2016-10-06 ENCOUNTER — Encounter: Payer: Self-pay | Admitting: Internal Medicine

## 2016-10-06 ENCOUNTER — Ambulatory Visit (INDEPENDENT_AMBULATORY_CARE_PROVIDER_SITE_OTHER): Payer: 59 | Admitting: Internal Medicine

## 2016-10-06 VITALS — BP 118/78 | HR 100 | Ht 61.0 in | Wt 141.2 lb

## 2016-10-06 DIAGNOSIS — K30 Functional dyspepsia: Secondary | ICD-10-CM

## 2016-10-06 NOTE — Patient Instructions (Signed)
   Today we are giving you samples of FDgard to try : Take 2 before meals up to 6 tablets a day.  We are providing you with coupons.   If the FDgard doesn't help please contact us.    I appreciate the opportunity to care for you. Silvano Rusk, MD, [facility]

## 2016-10-06 NOTE — Progress Notes (Signed)
   Nicole Crosby 50 y.o. July 12, 1966 [id number]  Assessment & Plan:   Encounter Diagnosis  Name Primary?  . Functional dyspepsia Yes   Will try FD GArd If that is not helping she will let me know - I think would consider duloxetine next. She has failed or had side effects from buspirone, PPI, TCA, dicyclomie, hyoscyamine.  I appreciate the opportunity to care for this patient. [id number] Birdie Riddle, MD   Subjective:   Chief Complaint: nausea, stomach discomfort/bloating  HPI Here for f/u - still having pc bloating/early satiety and nausea. Stress makes it worse - there is work stress mostly PPI's cause headaches and do not relieve, they do help heartburn but due to headaches uses intermittent Pepcid but still gets headaches with that  Tried TCA, buspirone, anti-cholinergics but has had side effects/no relief  Medications, allergies, past medical history, past surgical history, family history and social history are reviewed and updated in the EMR.  Review of Systems Sleeps fairly well  Objective:   Physical Exam BP 118/78   Pulse 100   Ht 5\' 1"  (1.549 m)   Wt 141 lb 4 oz (64.1 kg)   BMI 26.69 kg/m  NAD  15 minutes time spent with patient > half in counseling coordination of care

## 2016-10-07 ENCOUNTER — Encounter: Payer: Self-pay | Admitting: Internal Medicine

## 2016-10-12 MED FILL — POTASSIUM CL ER 20 MEQ TABL: 20 | 30 days supply | Qty: 60 | Fill #3

## 2016-10-12 MED FILL — LOSARTAN-HCTZ 50-12.5 MG TA: 50-12.5 | 90 days supply | Qty: 90 | Fill #1

## 2016-10-13 ENCOUNTER — Ambulatory Visit (INDEPENDENT_AMBULATORY_CARE_PROVIDER_SITE_OTHER): Payer: 59 | Admitting: Psychology

## 2016-10-13 DIAGNOSIS — R413 Other amnesia: Secondary | ICD-10-CM

## 2016-10-13 NOTE — Progress Notes (Signed)
   Neuropsychology Note  Nicole Crosby returned today for 2 hours of neuropsychological testing with technician, Milana Kidney, BS, under the supervision of Dr. Macarthur Critchley. The patient did not appear overtly distressed by the testing session, per behavioral observation or via self-report to the technician. Rest breaks were offered. Nicole Crosby will return within 2 weeks for a feedback session with Dr. Si Raider at which time her test performances, clinical impressions and treatment recommendations will be reviewed in detail. The patient understands she can contact our office should she require our assistance before this time.  Full report to follow.

## 2016-10-14 ENCOUNTER — Ambulatory Visit (INDEPENDENT_AMBULATORY_CARE_PROVIDER_SITE_OTHER): Payer: 59

## 2016-10-14 DIAGNOSIS — E538 Deficiency of other specified B group vitamins: Secondary | ICD-10-CM | POA: Diagnosis not present

## 2016-10-14 MED ORDER — VITAMIN B-12 100 MCG PO TABS: 1000.0000 ug | ORAL_TABLET | Freq: Every day | ORAL | Status: DC

## 2016-10-14 MED ORDER — CYANOCOBALAMIN 1000 MCG/ML IJ SOLN
1000.0000 ug | Freq: Once | INTRAMUSCULAR | Status: AC
  Administered 2016-10-14: 1000 ug via INTRAMUSCULAR

## 2016-10-14 NOTE — Progress Notes (Signed)
Pre visit review using our clinic tool,if applicable. No additional management support is needed unless otherwise documented below in the visit note.   Patient in for B 12 injection per order from Dr. Birdie Riddle on 12/17/15.( See Lab Note for this day).

## 2016-10-19 NOTE — Progress Notes (Signed)
NEUROPSYCHOLOGICAL EVALUATION   Name:    Nicole Crosby  Date of Birth:   1966/10/20 Date of Interview:  09/29/2016 Date of Testing:  10/13/2016   Date of Feedback:  10/20/2016       Background Information:  Reason for Referral:  Nicole Crosby is a 50 y.o., right-handed female referred by Dr. Ellouise Newer to assess her current level of cognitive functioning and assist in differential diagnosis. The current evaluation consisted of a review of available medical records, an interview with the patient, and the completion of a neuropsychological testing battery. Informed consent was obtained.  History of Presenting Problem:  Nicole Crosby reported onset of cognitive difficulties around last summer (2016) but she became more concerned in February of this year. She reported that she saw Dr. Birdie Riddle and told her she was having difficulty processing things, maybe because she had too much on her mind. Dr. Birdie Riddle prescribed Wellbutrin but the patient had adverse effects she so she d/c it. Nicole Crosby saw Dr. Delice Lesch on 08/28/2016 and at that time the patient reported that her cognitive functioning was worse when she was on Prilosec but had improved somewhat since stopping that medication. Neurologic exam was non-focal and MOCA was 30/30. Today the patient reported that she thinks the combination of interferon and B12 may have contributed to cognitive difficulties. She was started on B12 injections; she is not sure if this helped cognitive functioning at all.  The patient reported that she is having difficulty processing visual information. For example, she will look at a calendar and see an appointment scheduled at 2 pm but she will process it as 3 pm. 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 She questions whether she is following written instructions correctly. Upon direct questioning, she also endorses forgetfulness for recent conversations/events, repeating  questions/statements, increased spelling difficulty, and on a few occasions feeling uncertain about directions while driving. However, she has not gotten lost. She denies forgetfulness for autobiographical information or salient remote events. She denies frequent misplacing of items, forgetting to take medications, distractibility, or word finding difficulty.  She noted that she when she had a hearing test back in 2004 she was told hearing wasn't an issue but she had a problem with auditory processing. She has had hearing evaluations regularly since then.  She denied history of head injury or loss of consciousness.   The patient denied history of mental health issues including depression or anxiety. She has never been treated for a psychiatric disorder. She denied history of substance abuse or dependence.  There is no family history of dementia. Her 66 year old mother may have some memory loss, unclear if normal for age.  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 She independently manages all complex ADLs. She denied significant difficulty with driving, medications, or cooking. She manages the family finances and did forget to write a check down on one occasion; she also over-paid her homeowner's insurance.  Physically, the patient reported some GI problems, low energy, paresthesias in her left arm and both hands. She also endorsed some difficulty with balance. She had a vestibular evaluation in the past which was reportedly normal. She takes meclizine as needed for a dizzy/off balance sensation she sometimes has when sitting and reading. She has had to take it three times since her visit with Dr. Delice Lesch a month ago.  When asked about her current mood, the patient reported current work-related  stress which is bothering her. She has some difficulty falling asleep, usually because she has too much on her mind. She reported change in appetite which  she feels is related to her GI issues. She has less energy during the day and she is taking more naps. She denied suicidal ideation or intention.  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 Marital history: Married x24 years. Two children, ages 16 and 3. Alcohol/Tobacco/Substances: Occasional glass of wine, never a smoker, no SA.  Medical History:  Past Medical History:  Diagnosis Date  . Allergy    Buspirone, Sulfa, Pantoprazole  . Benign fundic gland polyps of stomach 02/12/2016  . Chicken pox   . DYSTHYMIA 09/30/2009  . Functional dyspepsia   . GERD (gastroesophageal reflux disease)   . H/O adenomatous polyp of colon 10/04/2014   09/2014 - 3 mm adenoma - repeat colonoscopy 2022  . Hyperlipidemia    no meds  . Hypertension    pt denies  . IBS (irritable bowel syndrome)   . Insomnia   . MEDIAL EPICONDYLITIS, RIGHT 07/17/2010  . 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 Other abnormal glucose    Tiny hypodensity left hepatic lobe-CT of abdomen and pelvis [id number] 61mm periumblical hernia  . PAC (premature atrial contraction)   . Palpitations    Current medications:  Outpatient Encounter Prescriptions as of 10/20/2016  Medication Sig  . Calcium Carbonate-Vit D-Min (CALCIUM 1200 PO) Take by mouth.  . cholecalciferol (VITAMIN D) 1000 UNITS tablet Take 1,000 Units by mouth daily.  . hyoscyamine (LEVSIN SL) 0.125 MG SL tablet Place 1 tablet (0.125 mg total) under the tongue every 4 (four) hours as needed.  Marland Kitchen losartan-hydrochlorothiazide (HYZAAR) 50-12.5 MG tablet TAKE 1 TABLET BY MOUTH DAILY.  . meclizine (ANTIVERT) 25 MG tablet Take 25 mg by mouth 3 (three) times daily as needed for dizziness.  Marland Kitchen NUVARING 0.12-0.015 MG/24HR vaginal ring INSERT 1 RING VAGINALLY AND LEAVE IN PLACE FOR 3 CONSECUTIVE WEEKS, THEN REMOVE FOR 1 WEEK.  .  potassium chloride SA (K-DUR,KLOR-CON) 20 MEQ tablet TAKE 1 TABLET BY MOUTH TWICE DAILY   Facility-Administered Encounter Medications as of 10/20/2016  Medication  . vitamin B-12 (CYANOCOBALAMIN) tablet 1,000 mcg   Current Examination:  Behavioral Observations:  Appearance: Neatly and appropriately dressed and groomed Gait: Ambulated independently, no abnormalities observed Speech: Fluent; normal rate, rhythm and volume. Increased response latencies. Thought process: Linear, goal directed Affect: Blunted Interpersonal: Pleasant, appropriate Orientation: Oriented to all spheres. Accurately named the current President and his predecessor.  Tests Administered: . Test of Premorbid Functioning (TOPF) . Wechsler Adult Intelligence Scale-Fourth Edition (WAIS-IV): Similarities, Block Design, Matrix Reasoning, Arithmetic, Symbol Search, Coding and Digit Span subtests . Wechsler Memory Scale-Fourth Edition (WMS-IV) Adult Version (ages 101-69): Logical Memory I, II and Recognition subtests  . [place] Verbal Learning Test - 2nd Edition (CVLT-2) Standard Form . Repeatable Battery for the Assessment of Neuropsychological Status (RBANS) Form A:  Figure Copy and Figure Recall and Line Orientation Subtests . Controlled Oral Word Association Test (COWAT) . Trail Making Test A and B . Neuropsychological Assessment Battery (NAB) Language Module, Form 1:  Naming and Reading Comprehension Subtests . Boston Diagnostic Aphasia Examination (BDAE): Complex Ideational Material and Commands Subtests . Beck Depression Inventory - Second edition (BDI-II) . Personality Assessment Inventory (PAI)  Test Results: Note: Standardized scores are presented only for use by appropriately  trained professionals and to allow for any future test-retest comparison. These scores should not be interpreted without consideration of all the information that is contained in the rest of the report. The most recent standardization  samples from the test publisher or other sources were used whenever possible to derive standard scores; scores were corrected for age, gender, ethnicity and education when available.   Test Scores:  Test Name Raw Score Standardized Score Descriptor  TOPF 35/70 SS= 93 Average  WAIS-IV Subtests     Similarities 23/36 ss= 9 Average  Block Design 46/66 ss= 11 Average  Matrix Reasoning 19/26 ss= 11 Average  Arithmetic 16/22 ss= 11 Average  Symbol Search 33/60 ss= 11  Average  Coding 82/135 ss= 13 High average  Digit Span 27/48 ss= 10 Average  WAIS-IV Index Scores     Working Memory  SS= 102 Average  Processing Speed  SS= 111 High average  WMS-IV Subtests     LM I 27/50 ss= 11 Average  LM II 25/50 ss= 12 High average  LM II Recognition 29/30 Cum %: >75 Above average  CVLT-II Scores     Trial 1 5/16 Z= -1 Low average  Trial 5 14/16 Z= 0.5 Average  Trials 1-5 total 49/80 T= 49 Average  SD Free Recall 12/16 Z= 0.5 Average  SD Cued Recall 12/16 Z= 0 Average  LD Free Recall 12/16 Z= 0 Average  LD Cued Recall 11/16 Z= -0.5 Average  Recognition Discriminability 15/16 hits, 0 false positives Z= 1 High average  Forced Choice Recognition 16/16  WNL  RBANS Subtests     Figure Copy 18/20 Z= -0.1 Average  Figure Recall 15/20 Z= 0.5 Average  Line Orientation 19/20 Z= 0.9 High average  COWAT-FAS 43 T= 48 Average  COWAT-Animals 21 T= 48 Average  Trail Making Test A  24" O errors T= 60 High average  Trail Making Test B  50" 0 errors T= 62 High average  NAB Language Subtests     Naming 31/31 T= 55 Average  Reading Comprehension 13/13 Cum %: 100 WNL  BDAE Subtests     Complex Ideational Material 11/12  WNL  Commands 15/15  WNL  BDI-II 11/63  WNL  PAI  No elevated clinical scales      Description of Test Results:  Embedded performance validity indicators were within normal limits. As such, the patient's current performance on neurocognitive testing is judged to be a relatively accurate  representation of her current level of neurocognitive functioning.   Premorbid verbal intellectual abilities were estimated to have been within the average range based on a test of word reading. Psychomotor processing speed was high average. Auditory attention and working memory were average. Visual-spatial construction was average. Visual-spatial perception on a line orientation task was intact. Language abilities were intact. Specifically, confrontation naming was average, and semantic verbal fluency was average. Basic reading comprehension was intact, and auditory comprehension of commands and of complex ideational material was within normal limits. With regard to verbal memory, encoding and acquisition of non-contextual information (i.e., word list) was average. After an interference task, free recall was average. After a delay, free recall was average. Cued recall was average. Performance on a yes/no recognition task was high average. On another verbal memory test, encoding and acquisition of contextual auditory information (i.e., short story) was average. After a delay, free recall was high average. Performance on a yes/no recognition task was above average. With regard to non-verbal memory, delayed free recall of visual information was  average. Executive functioning was intact overall. Mental flexibility and set-shifting were high average on Trails B. Verbal fluency with phonemic search restrictions was average. Verbal abstract reasoning was average. Non-verbal abstract reasoning was average.   On a self-report questionnaire of mood, the patient's responses were not indicative of clinically significant depression at the present time.   She was also administered a more extensive measure of psychopathology and personality (PAI). Certain symptom validity indicators on the PAI fell outside of the normal range, suggesting that the patient may not have answered in a completely forthright manner.   Specifically, with respect to positive impression management, the client's pattern of responses suggests that she tends to portray herself as being relatively free of common shortcomings to which most individuals will admit, and she appears somewhat reluctant to recognize minor faults in herself.  Given this apparent tendency to repress undesirable characteristics, the interpretive hypotheses in this report should be reviewed with caution.  Although there is no evidence to suggest an effort to intentionally distort the profile, the results may underrepresent the extent and degree of any significant findings in certain areas due to the client's tendency to avoid negative or unpleasant aspects of herself.  The patient's PAI clinical profile revealed no marked elevations that should be considered to indicate the presence of clinical psychopathology.  Scores on one or more scales do, however, show moderate elevations that may reflect sources of difficulty for the person.  Specifically, the patient reports some difficulties consistent with relatively mild or transient depressive symptomatology. Additionally, the patient describes herself as a socially isolated individual who has few interpersonal relationships that could be described as close and warm.  She may have difficulty interpreting the normal nuances of interpersonal behavior that provide the meaning to personal relationships.  Her social isolation and detachment may serve to decrease a sense of discomfort that interpersonal contact fosters. According to the patient's self-report, she describes NO significant problems in the following areas: antisocial behavior; problems with empathy; undue suspiciousness or hostility; extreme moodiness and impulsivity; unusually elevated mood or heightened activity; marked anxiety; problematic behaviors used to manage anxiety; difficulties with health or physical functioning.  Also, she reports NO significant problems with  alcohol or drug abuse or dependence.  With respect to suicidal ideation, the patient is NOT reporting distress from thoughts of self-harm.  Clinical Impressions: Diagnosis deferred (no cognitive disorder). Results of cognitive testing were entirely within normal limits. There were no areas of impairment, and all performances were at least average, with some scores in the high average range. 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 There is no evidence of an underlying dementia or neurocognitive disorder at this time. Improved stress management may improve cognitive functioning in daily life.   Recommendations/Plan: Based on the findings of the present evaluation, the following recommendations are offered:   1. The impact of stress on cognitive functioning will be reviewed with the patient. She will be encouraged to regularly engage in stress management activities, such as physical exercise, deep breathing or meditation.    2. The patient will be reassured that her cognitive test results were entirely within normal limits and not indicative of a cognitive disorder at this time. These test results will serve as a nice baseline for future comparison if needed at any time.  3. Strategies to enhance cognitive functioning in daily life will be reviewed.  Feedback  to Patient: Nicole Crosby returned for a feedback appointment on 10/20/2016 to review the results of her neuropsychological evaluation with this provider. 15 minutes face-to-face time was spent reviewing her test results, my impressions and my recommendations as detailed above.    Total time spent on this patient's case: [id number] unit for interview with psychologist; [phone] units of testing by psychometrician under psychologist's supervision; [phone] units for medical record review,  scoring of neuropsychological tests, interpretation of test results, preparation of this report, and review of results to the patient by psychologist.      Thank you for your referral of Nicole Crosby. Please feel free to contact me if you have any questions or concerns regarding this report.

## 2016-10-20 ENCOUNTER — Encounter: Payer: Self-pay | Admitting: Psychology

## 2016-10-20 ENCOUNTER — Ambulatory Visit (INDEPENDENT_AMBULATORY_CARE_PROVIDER_SITE_OTHER): Payer: 59 | Admitting: Psychology

## 2016-10-20 DIAGNOSIS — F341 Dysthymic disorder: Secondary | ICD-10-CM

## 2016-10-20 DIAGNOSIS — R413 Other amnesia: Secondary | ICD-10-CM

## 2016-10-20 NOTE — Patient Instructions (Signed)
Fortunately, results of cognitive testing were entirely within normal limits and NOT indicative of a neurocognitive disorder.  It is most likely the case that your cognitive symptoms in daily life are due to stress.    Strategies to enhance cognitive functioning Attention, concentration, memory encoding and consolidation    . Make a plan and be prepared o If you find that you are more attentive at certain times of the day (i.e., the morning), plan important activities and discussions at that time o Determine which activities take the most time and which are most important, then prioritize your "to do list" based on this information o Break tasks into simpler parts, understand the steps involved before starting o Rehearse the steps mentally or write them down. If you write them down, you can use this as a checklist to check off as you complete them. o Visualize completing the task  . Use external aids  o Write everything down that you do not need to know or work with right now. Don't store extra information in your brain that you don't need right now.  o Use a calendar or planner to make checklists, due dates and "to do" lists. o Use ONLY ONE calendar or planner and look at it frequently o Set alarms for important deadlines or appointments  . Minimize interruptions and distractions  o Find a good work environment, e.g., quiet room with a desk, close curtains, use earplugs, mask sounds with a fan or white noise machine o Turn off cell phone and/or email alerts during important tasks. In fact, it is helpful to schedule a block of time each day where you limit phone and email interruptions and focus on just the more detailed work you have. o Try to minimize the amount of background noise (i.e., television, music) when engaged in important tasks or conversations with others (note that some individuals find soft background music helpful in minimize distraction, so you may need to experiment with  optimal level of noise for specific situations)  . Use active effort = consciously attending to details, closely analyzing o Failures of encoding may reflect failure to attend to one's own actions o Be prepared to work more slowly than you usually do  o When reading, allow time for re-reading sections  o Check your work for errors  . Avoid multitasking o Do not attempt to complete more than one task at one time. Focus on one task until it is completed and then move on to the next one. o Avoid other activities while engaged in important tasks, such as talking on the phone while balancing the checkbook, making a shopping list during a meeting.   . Use self-talk during tasks o Repeat the steps of the activity to yourself as you complete them o Talk to yourself about your progress o This helps you maintain focus on the task and makes it easier to remember completing the task (Similar to "active effort" above)  . Conserve energy o Conserve energy to avoid fatigue and its effects on cognition - Get enough sleep - Pace yourself  and make sure to take breaks - Be open to receiving help - Exercise for increased energy  . Conversational vigilance = paying attention during a conversation  o Listen actively: focus on the speaker and position yourself so that you can clearly hear the him/her, and have open/relaxed posture  o Eye contact: Maintaining eye contact with the person you are speaking with may increase the likelihood that important information  is properly received  o Ask questions: Ask questions for clarification (e.g., request that the speaker explain something in a different way) or ask for information to be repeated if you become distracted, or if you do not hear or understand something during a conversation  o Paraphrase: Summarize or repeat back important information from a conversation in your own words to facilitate communication and ensure that you have heard correctly and  understand

## 2016-10-22 ENCOUNTER — Encounter: Payer: Self-pay | Admitting: Family Medicine

## 2016-10-22 ENCOUNTER — Ambulatory Visit (INDEPENDENT_AMBULATORY_CARE_PROVIDER_SITE_OTHER): Payer: 59 | Admitting: Family Medicine

## 2016-10-22 ENCOUNTER — Other Ambulatory Visit (HOSPITAL_COMMUNITY)
Admission: RE | Admit: 2016-10-22 | Discharge: 2016-10-22 | Disposition: A | Discharge status: Home / Self Care | Payer: 59 | Source: Ambulatory Visit | Attending: Family Medicine | Admitting: Family Medicine

## 2016-10-22 VITALS — BP 121/76 | HR 86 | Temp 98.6°F | Resp 16 | Ht 61.0 in | Wt 141.4 lb

## 2016-10-22 DIAGNOSIS — Z1151 Encounter for screening for human papillomavirus (HPV): Secondary | ICD-10-CM | POA: Diagnosis not present

## 2016-10-22 DIAGNOSIS — Z0000 Encounter for general adult medical examination without abnormal findings: Secondary | ICD-10-CM

## 2016-10-22 DIAGNOSIS — Z01419 Encounter for gynecological examination (general) (routine) without abnormal findings: Secondary | ICD-10-CM | POA: Insufficient documentation

## 2016-10-22 DIAGNOSIS — Z124 Encounter for screening for malignant neoplasm of cervix: Secondary | ICD-10-CM | POA: Diagnosis not present

## 2016-10-22 DIAGNOSIS — E538 Deficiency of other specified B group vitamins: Secondary | ICD-10-CM

## 2016-10-22 LAB — CBC WITH DIFFERENTIAL/PLATELET
Basophils Absolute: 85 cells/uL (ref 0–200)
Basophils Relative: 1 %
Eosinophils Absolute: 85 cells/uL (ref 15–500)
Eosinophils Relative: 1 %
HCT: 41.3 % (ref 35.0–45.0)
Hemoglobin: 13.1 g/dL (ref 11.7–15.5)
Lymphocytes Relative: 37 %
Lymphs Abs: 3145 cells/uL (ref 850–3900)
MCH: 29.6 pg (ref 27.0–33.0)
MCHC: 31.7 g/dL — ABNORMAL LOW (ref 32.0–36.0)
MCV: 93.2 fL (ref 80.0–100.0)
MPV: 8.7 fL (ref 7.5–12.5)
Monocytes Absolute: 680 cells/uL (ref 200–950)
Monocytes Relative: 8 %
Neutro Abs: 4505 cells/uL (ref 1500–7800)
Neutrophils Relative %: 53 %
Platelets: 315 10*3/uL (ref 140–400)
RBC: 4.43 MIL/uL (ref 3.80–5.10)
RDW: 12.9 % (ref 11.0–15.0)
WBC: 8.5 10*3/uL (ref 3.8–10.8)

## 2016-10-22 LAB — HEPATIC FUNCTION PANEL
ALT: 10 U/L (ref 6–29)
AST: 14 U/L (ref 10–35)
Albumin: 3.8 g/dL (ref 3.6–5.1)
Alkaline Phosphatase: 41 U/L (ref 33–130)
Bilirubin, Direct: 0.1 mg/dL (ref <=0.2)
Indirect Bilirubin: 0.2 mg/dL (ref 0.2–1.2)
Total Bilirubin: 0.3 mg/dL (ref 0.2–1.2)
Total Protein: 6.8 g/dL (ref 6.1–8.1)

## 2016-10-22 LAB — BASIC METABOLIC PANEL
BUN: 13 mg/dL (ref 7–25)
CO2: 25 mmol/L (ref 20–31)
Calcium: 8.7 mg/dL (ref 8.6–10.4)
Chloride: 101 mmol/L (ref 98–110)
Creat: 0.7 mg/dL (ref 0.50–1.05)
Glucose, Bld: 91 mg/dL (ref 65–99)
Potassium: 4.2 mmol/L (ref 3.5–5.3)
Sodium: 137 mmol/L (ref 135–146)

## 2016-10-22 LAB — LIPID PANEL
Cholesterol: 159 mg/dL (ref <200)
HDL: 58 mg/dL (ref >50)
LDL Cholesterol: 84 mg/dL (ref <100)
Total CHOL/HDL Ratio: 2.7 Ratio (ref <5.0)
Triglycerides: 83 mg/dL (ref <150)
VLDL: 17 mg/dL (ref <30)

## 2016-10-22 LAB — TSH: TSH: 0.97 mIU/L

## 2016-10-22 LAB — VITAMIN B12: Vitamin B-12: 660 pg/mL (ref 200–1100)

## 2016-10-22 NOTE — Assessment & Plan Note (Signed)
Pt's PE WNL.  UTD on mammo, colonoscopy, immunizations.  Pap done today.  Check labs.  Anticipatory guidance provided.  

## 2016-10-22 NOTE — Progress Notes (Signed)
Pre visit review using our clinic review tool, if applicable. No additional management support is needed unless otherwise documented below in the visit note. 

## 2016-10-22 NOTE — Progress Notes (Signed)
   Subjective:    Patient ID: Nicole Crosby, female    DOB: [birth year], 50 y.o.   MRN: [id number]  HPI CPE- UTD on colonoscopy, mammo.  UTD on flu, Tdap.     Review of Systems Patient reports no vision/ hearing changes, adenopathy,fever, weight change,  persistant/recurrent hoarseness , swallowing issues, chest pain, palpitations, edema, persistant/recurrent cough, hemoptysis, dyspnea (rest/exertional/paroxysmal nocturnal), gastrointestinal bleeding (melena, rectal bleeding), abdominal pain, significant heartburn, bowel changes, GU symptoms (dysuria, hematuria, incontinence), Gyn symptoms (abnormal  bleeding, pain),  syncope, focal weakness, memory loss, skin/hair/nail changes, abnormal bruising or bleeding, anxiety, or depression.   + numbness of hands at night bilaterally    Objective:   Physical Exam  General Appearance:    Alert, cooperative, no distress, appears stated age  Head:    Normocephalic, without obvious abnormality, atraumatic  Eyes:    PERRL, conjunctiva/corneas clear, EOM's intact, fundi    benign, both eyes  Ears:    Normal TM's and external ear canals, both ears  Nose:   Nares normal, septum midline, mucosa normal, no drainage    or sinus tenderness  Throat:   Lips, mucosa, and tongue normal; teeth and gums normal  Neck:   Supple, symmetrical, trachea midline, no adenopathy;    Thyroid: no enlargement/tenderness/nodules  Back:     Symmetric, no curvature, ROM normal, no CVA tenderness  Lungs:     Clear to auscultation bilaterally, respirations unlabored  Chest Wall:    No tenderness or deformity   Heart:    Regular rate and rhythm, S1 and S2 normal, no murmur, rub   or gallop  Breast Exam:    No tenderness, masses, or nipple abnormality  Abdomen:     Soft, non-tender, bowel sounds active all four quadrants,    no masses, no organomegaly  Genitalia:    External genitalia normal, cervix normal in appearance, no CMT, uterus in normal size and position, adnexa w/out  mass or tenderness, mucosa pink and moist, no lesions or discharge present  Rectal:    Normal external appearance  Extremities:   Extremities normal, atraumatic, no cyanosis or edema  Pulses:   2+ and symmetric all extremities  Skin:   Skin color, texture, turgor normal, no rashes or lesions  Lymph nodes:   Cervical, supraclavicular, and axillary nodes normal  Neurologic:   CNII-XII intact, normal strength, sensation and reflexes    throughout          Assessment & Plan:

## 2016-10-22 NOTE — Patient Instructions (Signed)
Follow up in 6 months to recheck BP We'll notify you of your lab results and make any changes if needed Continue to work on healthy diet and regular exercise- you look great! Get OTC wrist splints to wear at night for the hand numbness Try and adjust your sleeping position to improve the numbness in your butt Call with any questions or concerns Happy Holidays!!!

## 2016-10-23 ENCOUNTER — Encounter: Payer: 59 | Admitting: Family Medicine

## 2016-10-23 LAB — VITAMIN D 25 HYDROXY (VIT D DEFICIENCY, FRACTURES): Vit D, 25-Hydroxy: 45 ng/mL (ref 30–100)

## 2016-10-27 LAB — CYTOLOGY - PAP
Diagnosis: NEGATIVE
HPV: NOT DETECTED

## 2016-10-28 ENCOUNTER — Encounter: Payer: Self-pay | Admitting: Family Medicine

## 2016-11-11 ENCOUNTER — Ambulatory Visit (INDEPENDENT_AMBULATORY_CARE_PROVIDER_SITE_OTHER): Payer: 59 | Admitting: Behavioral Health

## 2016-11-11 DIAGNOSIS — E538 Deficiency of other specified B group vitamins: Secondary | ICD-10-CM

## 2016-11-11 MED ORDER — CYANOCOBALAMIN 1000 MCG/ML IJ SOLN
1000.0000 ug | Freq: Once | INTRAMUSCULAR | Status: AC
  Administered 2016-11-11: 1000 ug via INTRAMUSCULAR

## 2016-11-11 NOTE — Progress Notes (Addendum)
Pre visit review using our clinic review tool, if applicable. No additional management support is needed unless otherwise documented below in the visit note.  Patient came in clinic today for B12 injection. IM given in Left Deltoid. Patient tolerated injection well.  Reviewed RN note. Ok/approve b12 injection.  Saguier, Percell Miller, PA-C

## 2016-11-13 ENCOUNTER — Encounter: Payer: Self-pay | Admitting: Family Medicine

## 2016-11-16 ENCOUNTER — Encounter: Payer: Self-pay | Admitting: Family Medicine

## 2016-11-25 ENCOUNTER — Encounter: Payer: Self-pay | Admitting: Neurology

## 2016-11-25 ENCOUNTER — Ambulatory Visit (INDEPENDENT_AMBULATORY_CARE_PROVIDER_SITE_OTHER): Payer: 59 | Admitting: Neurology

## 2016-11-25 VITALS — BP 126/70 | HR 96 | Ht 61.0 in | Wt 142.4 lb

## 2016-11-25 DIAGNOSIS — R413 Other amnesia: Secondary | ICD-10-CM | POA: Diagnosis not present

## 2016-11-25 DIAGNOSIS — G5603 Carpal tunnel syndrome, bilateral upper limbs: Secondary | ICD-10-CM | POA: Diagnosis not present

## 2016-11-25 NOTE — Progress Notes (Signed)
NEUROLOGY FOLLOW UP OFFICE NOTE  ADDISON ABBONDANZA [id number]  HISTORY OF PRESENT ILLNESS: I had the pleasure of seeing Nicole Crosby in follow-up in the neurology clinic on 11/25/2016.  The patient was last seen 3 months ago for memory changes and dizziness. Records and images were personally reviewed where available.  She underwent Neuropsychological evaluation last December 2017 with normal cognitive testing. There were no areas of impairment, and all performances were at least average, with some scores in the high average range. 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 There was no evidence of an underlying dementia or neurocognitive disorder at this time. Improved stress management may improve cognitive functioning in daily life.   She returns today stating she is doing well. 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 She was reporting dizziness on her last visit, mostly feeling off balance when she turns her bike to the right side or when laying her head on the desk and turning it to the right. She has also noticed numbness and tingling in both hands when she wakes up. She tried a wrist splint but feels it made wrists more uncomfortable. No hand weakness. She denies any headaches, diplopia, back pain, bowel/bladder dysfunction. No falls.   HPI 08/28/2016: This is a 51 yo RH woman with a history of hypertension, GERD, who presented for headaches, memory loss, and dizziness. She reports the headaches were occurring frequently when she was taking medications for her reflux (Nexium, Prilosec). With stopping these, the headaches have significantly improved. They only seldom occur. She usually now just takes TUMS or rarely prn Famotidine. She reports that she does not have vertigo, but more of a "loopy sensation" which  occurs only when she is sitting down or reading. She would feel like she is on a boat or like someone is trying to push her over. No associated headache, nausea/vomiting, focal symptoms. She would usually take a meclizine which seems to help. They do not seem to occur when she is supine or standing. She was having these symptoms 3-4 times a week in February, but this has improved as well. She had sensations of dysequilibrium when walking around 5 years ago and had vestibular therapy, but was told symptoms were not due to vestibular dysfunction. She has noticed some changes in her hearing but missed her hearing test last April. No tinnitus. Her main concern today is the memory change that started in the Fall of last year, but worse in February 2017. It appears she had reported memory changes in 2015 and had an MRI brain with and without contrast which I personally reviewed, which was normal. She has noticed that her brain is not processing things correctly. She asked to see a neurologist after she forgot about a work appointment last August. She had noticed that when she was taking Prilosec, she was definitely more confused, forgetting coworkers names, her address, using her maiden name when she had been married for 20+ years. This did improved when she stopped Prilosec, but she continues to notice some changes. Co-workers and her daughter has mentioned as well that she would repeat herself. She denies getting lost driving, no missed bill payments or medications. She denies misplacing things frequently and has not left the stove on. She has occasional difficulties multitasking. She noticed she has to lean back on her car's head rest to "  avoid being loopy." She had numbness and tingling in her hands and feet in February and was found to have a low B12 level. She is now on monthly injections, most recent B12 level in August 2017 was 402. Paresthesias are better. Her mother may have dementia. She denies any significant  head injuries, she drinks alcohol occasionally.   PAST MEDICAL HISTORY: Past Medical History:  Diagnosis Date  . Allergy    Buspirone, Sulfa, Pantoprazole  . Benign fundic gland polyps of stomach 02/12/2016  . Chicken pox   . DYSTHYMIA 09/30/2009  . Functional dyspepsia   . GERD (gastroesophageal reflux disease)   . H/O adenomatous polyp of colon 10/04/2014   09/2014 - 3 mm adenoma - repeat colonoscopy 2022  . Hyperlipidemia    no meds  . Hypertension    pt denies  . IBS (irritable bowel syndrome)   . Insomnia   . MEDIAL EPICONDYLITIS, RIGHT 07/17/2010  . 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 Other abnormal glucose    Tiny hypodensity left hepatic lobe-CT of abdomen and pelvis 99991111 35mm periumblical hernia  . PAC (premature atrial contraction)   . Palpitations     MEDICATIONS: Current Outpatient Prescriptions on File Prior to Visit  Medication Sig Dispense Refill  . Calcium Carbonate-Vit D-Min (CALCIUM 1200 PO) Take by mouth.    . cholecalciferol (VITAMIN D) 1000 UNITS tablet Take 1,000 Units by mouth daily.    . hyoscyamine (LEVSIN SL) 0.125 MG SL tablet Place 1 tablet (0.125 mg total) under the tongue every 4 (four) hours as needed. 60 tablet 2  . losartan-hydrochlorothiazide (HYZAAR) 50-12.5 MG tablet TAKE 1 TABLET BY MOUTH DAILY. 90 tablet 1  . meclizine (ANTIVERT) 25 MG tablet Take 25 mg by mouth 3 (three) times daily as needed for dizziness.    Marland Kitchen NUVARING 0.12-0.015 MG/24HR vaginal ring INSERT 1 RING VAGINALLY AND LEAVE IN PLACE FOR 3 CONSECUTIVE WEEKS, THEN REMOVE FOR 1 WEEK. 1 each 11  . potassium chloride SA (K-DUR,KLOR-CON) 20 MEQ tablet TAKE 1 TABLET BY MOUTH TWICE DAILY 60 tablet 6   Current Facility-Administered Medications on File Prior to Visit  Medication Dose Route Frequency Provider Last Rate Last Dose  . vitamin B-12 (CYANOCOBALAMIN) tablet 1,000 mcg  1,000 mcg Oral Daily Midge Minium, MD        ALLERGIES: Allergies  Allergen Reactions  .  Buspirone Other (See Comments)     nightmare  . Esomeprazole Other (See Comments)    headache  . Lansoprazole     Abdominal pain  . Omeprazole     Headache  . Sulfonamide Derivatives Rash     Rash    FAMILY HISTORY: Family History  Problem Relation Age of Onset  . Hypertension Mother   . Hyperlipidemia Mother   . Fibrocystic breast disease Mother   . Hearing loss Mother   . Osteopenia Mother   . Prostate cancer Father 71    deceased  . Hypertension Father   . Diabetes Father   . Kidney disease Father   . Heart disease Father     MI  . Hyperlipidemia Father   . Other Brother     unknown  . Thyroid disease Sister   . Diabetes Sister     pre-diabetes  . Hypertension Sister   . Arthritis Sister   . Diabetes Sister   . Hypertension Sister   . Hyperlipidemia Sister   . Fibrocystic breast disease Sister   . Colon cancer Maternal Uncle  died in late 5's  . Diabetes Maternal Grandmother   . Prostate cancer Maternal Grandfather   . COPD Maternal Grandfather   . Hyperlipidemia Sister   . Hypertension Sister   . Rectal cancer Neg Hx   . Stomach cancer Neg Hx   . Esophageal cancer Neg Hx   . Pancreatic cancer Neg Hx     SOCIAL HISTORY: Social History   Social History  . Marital status: Married    Spouse name: N/A  . Number of children: 2  . Years of education: N/A   Occupational History  . Xray/CT Tech [employer]   Social History Main Topics  . Smoking status: Never Smoker  . Smokeless tobacco: Never Used  . Alcohol use 0.6 oz/week    1 Glasses of wine per week     Comment: occasional  . Drug use: No  . Sexual activity: Yes    Birth control/ protection: Inserts   Other Topics Concern  . 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    Alcohol Use - yes-wine         Full Time    Married            REVIEW OF SYSTEMS: Constitutional: No fevers, chills, or sweats, no generalized  fatigue, change in appetite Eyes: No visual changes, double vision, eye pain Ear, nose and throat: No hearing loss, ear pain, nasal congestion, sore throat Cardiovascular: No chest pain, palpitations Respiratory:  No shortness of breath at rest or with exertion, wheezes GastrointestinaI: No nausea, vomiting, diarrhea, abdominal pain, fecal incontinence Genitourinary:  No dysuria, urinary retention or frequency Musculoskeletal:  + neck pain, no back pain Integumentary: No rash, pruritus, skin lesions Neurological: as above Psychiatric: No depression, insomnia, anxiety Endocrine: No palpitations, fatigue, diaphoresis, mood swings, change in appetite, change in weight, increased thirst Hematologic/Lymphatic:  No anemia, purpura, petechiae. Allergic/Immunologic: no itchy/runny eyes, nasal congestion, recent allergic reactions, rashes  PHYSICAL EXAM: Vitals:   11/25/16 1423  BP: 126/70  Pulse: 96   General: No acute distress Head:  Normocephalic/atraumatic Neck: supple, no paraspinal tenderness, full range of motion Heart:  Regular rate and rhythm Lungs:  Clear to auscultation bilaterally Back: No paraspinal tenderness Skin/Extremities: No rash, no edema Neurological Exam: alert and oriented to person, place, and time. No aphasia or dysarthria. Fund of knowledge is appropriate.  Recent and remote memory are intact.  Attention and concentration are normal.    Able to name objects and repeat phrases. Cranial nerves: Pupils equal, round, reactive to light.  Extraocular movements intact with no nystagmus. Visual fields full. Facial sensation intact. No facial asymmetry. Tongue, uvula, palate midline.  Motor: Bulk and tone normal, muscle strength 5/5 throughout with no pronator drift.  Sensation to light touch intact.  No extinction to double simultaneous stimulation.  Deep tendon reflexes 2+ throughout, toes downgoing.  Finger to nose testing intact.  Gait narrow-based and steady, able to tandem  walk adequately.  Romberg negative. +Tinel's sign on left wrist, pain on right wrist. Negative on elbow. Negative Phalen sign.  IMPRESSION: This is a 51 yo RH woman with a history of hypertension, B12 deficiency, GERD, who presented for evaluation of headaches, dysequilibrium, and memory changes. The headaches are pretty much resolved/improved with stopping reflux medications. Neuropsychological testing showed normal cognition, with cognitive complaints likely due to underlying stress. Patient agrees and looks forward to less stress at work. We  again discussed the dysequilibrium, which appears to occur more with certain head movements. Repeat Vestibular therapy can again be done. We discussed that non-specific dizziness can sometimes respond to SSRIs or TCAs, she would like to hold off for now. She has clinical signs of bilateral carpal tunnel syndrome, continue wrist splints and avoiding compression at the wrist. She will follow-up in 6 months and knows to call for any changes.   Thank you for allowing me to participate in her care.  Please do not hesitate to call for any questions or concerns.  The duration of this appointment visit was 25 minutes of face-to-face time with the patient.  Greater than 50% of this time was spent in counseling, explanation of diagnosis, planning of further management, and coordination of care.   Ellouise Newer, M.D.   CC: Dr. Birdie Riddle

## 2016-11-25 NOTE — Patient Instructions (Signed)
1. Start using wrist splint during the daytime. At night, try wrapping towel around wrists instead 2. If equilibrium issues worsen, we can try doing another round of vestibular therapy 3. Continue with stress management techniques 4. Follow-up in 6 months, call for any changes

## 2016-11-30 MED FILL — NUVARING VAGINAL RING: 0.12-0.015 | 84 days supply | Qty: 3 | Fill #3

## 2016-12-14 MED FILL — POTASSIUM CL ER 20 MEQ TABL: 20 | 30 days supply | Qty: 60 | Fill #4

## 2016-12-22 ENCOUNTER — Other Ambulatory Visit: Payer: Self-pay | Admitting: Internal Medicine

## 2016-12-22 DIAGNOSIS — R140 Abdominal distension (gaseous): Secondary | ICD-10-CM

## 2016-12-22 DIAGNOSIS — R1010 Upper abdominal pain, unspecified: Secondary | ICD-10-CM

## 2016-12-22 DIAGNOSIS — R6881 Early satiety: Secondary | ICD-10-CM

## 2016-12-22 NOTE — Telephone Encounter (Signed)
Please advise how many refills Sir, seen in Dec. 2017, thank you.

## 2016-12-23 NOTE — Telephone Encounter (Signed)
Refill x6

## 2016-12-24 MED FILL — OSCIMIN SL 0.125 MG TABLET: 0.125 | 10 days supply | Qty: 60 | Fill #0

## 2016-12-30 ENCOUNTER — Emergency Department (HOSPITAL_BASED_OUTPATIENT_CLINIC_OR_DEPARTMENT_OTHER)
Admission: EM | Admit: 2016-12-30 | Discharge: 2016-12-30 | Disposition: A | Discharge status: Home / Self Care | Payer: 59 | Attending: Physician Assistant | Admitting: Physician Assistant

## 2016-12-30 ENCOUNTER — Emergency Department (HOSPITAL_BASED_OUTPATIENT_CLINIC_OR_DEPARTMENT_OTHER): Payer: 59

## 2016-12-30 ENCOUNTER — Encounter (HOSPITAL_BASED_OUTPATIENT_CLINIC_OR_DEPARTMENT_OTHER): Payer: Self-pay | Admitting: Emergency Medicine

## 2016-12-30 DIAGNOSIS — Z79899 Other long term (current) drug therapy: Secondary | ICD-10-CM | POA: Insufficient documentation

## 2016-12-30 DIAGNOSIS — I10 Essential (primary) hypertension: Secondary | ICD-10-CM | POA: Diagnosis not present

## 2016-12-30 DIAGNOSIS — R42 Dizziness and giddiness: Secondary | ICD-10-CM | POA: Diagnosis not present

## 2016-12-30 DIAGNOSIS — R51 Headache: Secondary | ICD-10-CM | POA: Diagnosis not present

## 2016-12-30 DIAGNOSIS — Z0389 Encounter for observation for other suspected diseases and conditions ruled out: Secondary | ICD-10-CM | POA: Diagnosis not present

## 2016-12-30 LAB — CBC WITH DIFFERENTIAL/PLATELET
Basophils Absolute: 0 10*3/uL (ref 0.0–0.1)
Basophils Relative: 0 %
Eosinophils Absolute: 0.1 10*3/uL (ref 0.0–0.7)
Eosinophils Relative: 1 %
HCT: 39 % (ref 36.0–46.0)
Hemoglobin: 13 g/dL (ref 12.0–15.0)
Lymphocytes Relative: 37 %
Lymphs Abs: 2.8 10*3/uL (ref 0.7–4.0)
MCH: 30.6 pg (ref 26.0–34.0)
MCHC: 33.3 g/dL (ref 30.0–36.0)
MCV: 91.8 fL (ref 78.0–100.0)
Monocytes Absolute: 0.6 10*3/uL (ref 0.1–1.0)
Monocytes Relative: 7 %
Neutro Abs: 4.2 10*3/uL (ref 1.7–7.7)
Neutrophils Relative %: 55 %
Platelets: 307 10*3/uL (ref 150–400)
RBC: 4.25 MIL/uL (ref 3.87–5.11)
RDW: 12.7 % (ref 11.5–15.5)
WBC: 7.7 10*3/uL (ref 4.0–10.5)

## 2016-12-30 LAB — COMPREHENSIVE METABOLIC PANEL
ALT: 13 U/L — ABNORMAL LOW (ref 14–54)
AST: 17 U/L (ref 15–41)
Albumin: 3.6 g/dL (ref 3.5–5.0)
Alkaline Phosphatase: 42 U/L (ref 38–126)
Anion gap: 8 (ref 5–15)
BUN: 10 mg/dL (ref 6–20)
CO2: 24 mmol/L (ref 22–32)
Calcium: 8.7 mg/dL — ABNORMAL LOW (ref 8.9–10.3)
Chloride: 100 mmol/L — ABNORMAL LOW (ref 101–111)
Creatinine, Ser: 0.6 mg/dL (ref 0.44–1.00)
GFR calc Af Amer: >60 mL/min (ref >60)
GFR calc non Af Amer: >60 mL/min (ref >60)
Glucose, Bld: 99 mg/dL (ref 65–99)
Potassium: 3 mmol/L — ABNORMAL LOW (ref 3.5–5.1)
Sodium: 132 mmol/L — ABNORMAL LOW (ref 135–145)
Total Bilirubin: 0.5 mg/dL (ref 0.3–1.2)
Total Protein: 7.3 g/dL (ref 6.5–8.1)

## 2016-12-30 LAB — TROPONIN I: Troponin I: <0.03 ng/mL (ref <0.03)

## 2016-12-30 MED ORDER — DIAZEPAM 5 MG/ML IJ SOLN
2.5000 mg | Freq: Once | INTRAMUSCULAR | Status: AC
  Administered 2016-12-30: 2.5 mg via INTRAVENOUS
  Filled 2016-12-30: qty 2

## 2016-12-30 MED ORDER — SODIUM CHLORIDE 0.9 % IV BOLUS (SEPSIS)
1000.0000 mL | Freq: Once | INTRAVENOUS | Status: AC
  Administered 2016-12-30: 1000 mL via INTRAVENOUS

## 2016-12-30 MED ORDER — ONDANSETRON HCL 4 MG/2ML IJ SOLN
4.0000 mg | Freq: Once | INTRAMUSCULAR | Status: AC
  Administered 2016-12-30: 4 mg via INTRAVENOUS
  Filled 2016-12-30: qty 2

## 2016-12-30 MED ORDER — POTASSIUM CHLORIDE CRYS ER 20 MEQ PO TBCR
40.0000 meq | EXTENDED_RELEASE_TABLET | Freq: Once | ORAL | Status: AC
  Administered 2016-12-30: 40 meq via ORAL
  Filled 2016-12-30: qty 2

## 2016-12-30 NOTE — ED Triage Notes (Addendum)
Patient reports headache since this morning.  States she took 500mg  of tylenol for this. 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  States she took 25mg  of meclizine at 1215 and took 25mg  more meclizine prior to triage.  States that she has not had any relief from meclizine.

## 2016-12-30 NOTE — ED Provider Notes (Signed)
[facility] Provider Note   CSN: [id number] Arrival date & time: 12/30/16  1403     History   Chief Complaint Chief Complaint  Patient presents with  . Headache    HPI Nicole Crosby is a 51 y.o. female.  HPI    Pt is a 51 year old female presenting with lightheadedness. Patient reports that her head feels "funny". Patient reports she's had this in the past. She reports that she has over-the-counter meclizine which she had with her today. She took 25 mg earlier today and then an additional 25 mg in the waiting room. Patient states that she's had this on and off dizziness for the last year, seen by neurology, referred to vestibular rehabilitation.  Mildly worse when moving her head, mildly worse when sitting to standing.  No other neurologic complaints.   Past Medical History:  Diagnosis Date  . Allergy    Buspirone, Sulfa, Pantoprazole  . Benign fundic gland polyps of stomach 02/12/2016  . Chicken pox   . DYSTHYMIA 09/30/2009  . Functional dyspepsia   . GERD (gastroesophageal reflux disease)   . H/O adenomatous polyp of colon 10/04/2014   09/2014 - 3 mm adenoma - repeat colonoscopy 2022  . Hyperlipidemia    no meds  . Hypertension    pt denies  . IBS (irritable bowel syndrome)   . Insomnia   . MEDIAL EPICONDYLITIS, RIGHT 07/17/2010  . 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 Other abnormal glucose    Tiny hypodensity left hepatic lobe-CT of abdomen and pelvis 99991111 77mm periumblical hernia  . PAC (premature atrial contraction)   . Palpitations     Patient Active Problem List   Diagnosis Date Noted  . Memory changes 08/28/2016  . 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 Benign fundic gland polyps of stomach 02/12/2016  . Paresthesia 12/17/2015  . Bladder spasm 12/13/2014  . H/O adenomatous polyp of colon 10/04/2014  . Fatigue 09/03/2014  . Chest pain 03/14/2014  . Decreased hearing of right ear 02/05/2014  . Pain  of left clavicle 02/05/2014  . Other malaise and fatigue 01/11/2014  . Maxillary sinusitis 10/13/2013  . Screening for malignant neoplasm of cervix 05/31/2013  . Physical exam 05/31/2013  . Pelvic pain in female 05/31/2013  . Supraclavicular fossa fullness 05/31/2013  . Left L3 lumbar radiculitis 09/23/2012  . Right foot pain 09/19/2012  . Neck pain 08/22/2012  . Gait disturbance 08/16/2012  . Facial twitching 08/16/2012  . Adrenal nodule ([place]) 03/02/2012  . Essential hypertension, benign 03/02/2012  . IBS (irritable bowel syndrome)   . PAC (premature atrial contraction)   . Insomnia   . GERD (gastroesophageal reflux disease)   . DYSTHYMIA 09/30/2009  . PALPITATIONS 06/18/2009  . HEADACHE 09/21/2008  . HEPATIC CYST 08/14/2008  . Hyperlipidemia 08/06/2008    Past Surgical History:  Procedure Laterality Date  . CESAREAN SECTION  01/2001  . COLONOSCOPY    . DE QUERVAIN'S RELEASE  2000   Right wrist area    OB History    Gravida Para Term Preterm AB Living   2 2           SAB TAB Ectopic Multiple Live Births                   Home Medications    Prior to Admission medications   Medication Sig Start Date End Date Taking? Authorizing Provider  Calcium Carbonate-Vit D-Min (CALCIUM 1200 PO) Take by mouth.    Historical  Provider, MD  cholecalciferol (VITAMIN D) 1000 UNITS tablet Take 1,000 Units by mouth daily.    Historical Provider, MD  hyoscyamine (LEVSIN SL) 0.125 MG SL tablet PLACE 1 TABLET (0.125 MG TOTAL) UNDER THE TONGUE EVERY 4 (FOUR) HOURS AS NEEDED. 12/24/16   Gatha Mayer, MD  losartan-hydrochlorothiazide (HYZAAR) 50-12.5 MG tablet TAKE 1 TABLET BY MOUTH DAILY. 07/14/16   Midge Minium, MD  meclizine (ANTIVERT) 25 MG tablet Take 25 mg by mouth 3 (three) times daily as needed for dizziness.    Historical Provider, MD  NUVARING 0.12-0.015 MG/24HR vaginal ring INSERT 1 RING VAGINALLY AND LEAVE IN PLACE FOR 3 CONSECUTIVE WEEKS, THEN REMOVE FOR 1 WEEK. 03/25/16    Midge Minium, MD  potassium chloride SA (K-DUR,KLOR-CON) 20 MEQ tablet TAKE 1 TABLET BY MOUTH TWICE DAILY 06/09/16   Midge Minium, MD    Family History Family History  Problem Relation Age of Onset  . Hypertension Mother   . Hyperlipidemia Mother   . Fibrocystic breast disease Mother   . Hearing loss Mother   . Osteopenia Mother   . Prostate cancer Father 67    deceased  . Hypertension Father   . Diabetes Father   . Kidney disease Father   . Heart disease Father     MI  . Hyperlipidemia Father   . Other Brother     unknown  . Thyroid disease Sister   . Diabetes Sister     pre-diabetes  . Hypertension Sister   . Arthritis Sister   . Diabetes Sister   . Hypertension Sister   . Hyperlipidemia Sister   . Fibrocystic breast disease Sister   . Colon cancer Maternal Uncle     died in late 49's  . Diabetes Maternal Grandmother   . Prostate cancer Maternal Grandfather   . COPD Maternal Grandfather   . Hyperlipidemia Sister   . Hypertension Sister   . Rectal cancer Neg Hx   . Stomach cancer Neg Hx   . Esophageal cancer Neg Hx   . Pancreatic cancer Neg Hx     Social History Social History  Substance Use Topics  . Smoking status: Never Smoker  . Smokeless tobacco: Never Used  . Alcohol use 0.6 oz/week    1 Glasses of wine per week     Comment: occasional     Allergies   Buspirone; Esomeprazole; Lansoprazole; Omeprazole; and Sulfonamide derivatives   Review of Systems Review of Systems  Constitutional: Negative for activity change.  HENT: Negative for drooling, facial swelling and hearing loss.   Respiratory: Negative for shortness of breath.   Cardiovascular: Negative for chest pain.  Gastrointestinal: Negative for abdominal pain.  Neurological: Positive for dizziness and light-headedness. Negative for tremors, seizures, syncope, facial asymmetry, speech difficulty, weakness and numbness.  All other systems reviewed and are negative.    Physical  Exam Updated Vital Signs BP 158/96 (BP Location: Right Arm)   Pulse 101   Temp 97.8 F (36.6 C) (Oral)   Resp 16   Ht 5\' 1"  (1.549 m)   Wt 140 lb (63.5 kg)   LMP 12/05/2016 (Exact Date)   SpO2 100%   BMI 26.45 kg/m   Physical Exam  Constitutional: She is oriented to person, place, and time. She appears well-developed and well-nourished.  HENT:  Head: Normocephalic and atraumatic.  Eyes: Right eye exhibits no discharge.  Cardiovascular: Normal rate and regular rhythm.   Pulmonary/Chest: Effort normal and breath sounds normal.  Neurological: She is  oriented to person, place, and time. She displays normal reflexes. No cranial nerve deficit or sensory deficit. She exhibits normal muscle tone. Coordination normal.  Equal strength bilaterally upper and lower extremities negative pronator drift. Normal sensation bilaterally. Speech comprehensible, no slurring. Facial nerve tested and appears grossly normal. Alert and oriented 3.   Skin: Skin is warm and dry. She is not diaphoretic.  Psychiatric: She has a normal mood and affect.  Nursing note and vitals reviewed.    ED Treatments / Results  Labs (all labs ordered are listed, but only abnormal results are displayed) Labs Reviewed  CBC WITH DIFFERENTIAL/PLATELET  COMPREHENSIVE METABOLIC PANEL  I-STAT [facility], ED    EKG  EKG Interpretation None       Radiology No results found.  Procedures Procedures (including critical care time)  Medications Ordered in ED Medications  sodium chloride 0.9 % bolus 1,000 mL (not administered)  ondansetron (ZOFRAN) injection 4 mg (not administered)  diazepam (VALIUM) injection 2.5 mg (not administered)     Initial Impression / Assessment and Plan / ED Course  I have reviewed the triage vital signs and the nursing notes.  Pertinent labs & imaging results that were available during my care of the patient were reviewed by me and considered in my medical decision making (see chart  for details).     Patient is a 51 year old female visiting with lightheadedness/dizziness. Patient has no nystagmus on exam. Normal neurologic exam. Doubt stroke given all risk factors hypertension and no evidence of dysmetria, or unsteadiness. Not likely to be vestibular she has no nystagmus and is not reproducible with head movement. Think this likely represents her chronic intermittent dizziness versus dehydration or electrolyte abdomen laterality. We'll give her fluids, get basic labs, EKG. She has already had pretty extensive referral for the same issue without resolution but I'll encourage her to continue following up as planned.  Final Clinical Impressions(s) / ED Diagnoses   Final diagnoses:  None    New Prescriptions New Prescriptions   No medications on file     Namine Beahm Julio Alm, MD 12/30/16 2247

## 2016-12-30 NOTE — ED Notes (Signed)
D/c home with family to drive. Note given for work

## 2016-12-30 NOTE — Discharge Instructions (Signed)
We are unsure what caused your dizziness today. You did have a mildly low sodium and mild low potassium. Continue your home K.  Please follow-up with her primary care to retest a couple weeks. TPlease continue to stay hydrated drink Gatorade and return with any concerns. We recommend that you continue to pursue outpatient workups for your dizziness with neurology and vestibular.

## 2017-01-22 ENCOUNTER — Ambulatory Visit (INDEPENDENT_AMBULATORY_CARE_PROVIDER_SITE_OTHER): Payer: 59 | Admitting: Family Medicine

## 2017-01-22 ENCOUNTER — Other Ambulatory Visit: Payer: Self-pay | Admitting: Family Medicine

## 2017-01-22 ENCOUNTER — Encounter: Payer: Self-pay | Admitting: Family Medicine

## 2017-01-22 VITALS — BP 121/81 | HR 94 | Temp 98.8°F | Resp 16 | Ht 61.0 in | Wt 140.5 lb

## 2017-01-22 DIAGNOSIS — R42 Dizziness and giddiness: Secondary | ICD-10-CM

## 2017-01-22 DIAGNOSIS — E538 Deficiency of other specified B group vitamins: Secondary | ICD-10-CM | POA: Diagnosis not present

## 2017-01-22 LAB — BASIC METABOLIC PANEL
BUN: 10 mg/dL (ref 6–23)
CO2: 28 mEq/L (ref 19–32)
Calcium: 9.4 mg/dL (ref 8.4–10.5)
Chloride: 104 mEq/L (ref 96–112)
Creatinine, Ser: 0.59 mg/dL (ref 0.40–1.20)
GFR: 113.29 mL/min (ref >60.00)
Glucose, Bld: 122 mg/dL — ABNORMAL HIGH (ref 70–99)
Potassium: 3.8 mEq/L (ref 3.5–5.1)
Sodium: 140 mEq/L (ref 135–145)

## 2017-01-22 LAB — VITAMIN B12: Vitamin B-12: 577 pg/mL (ref 211–911)

## 2017-01-22 MED FILL — POTASSIUM CL ER 20 MEQ TABL: 20 | 30 days supply | Qty: 60 | Fill #5

## 2017-01-22 MED FILL — LOSARTAN-HCTZ 50-12.5 MG TA: 50-12.5 | 90 days supply | Qty: 90 | Fill #0

## 2017-01-22 NOTE — Progress Notes (Signed)
Pre visit review using our clinic review tool, if applicable. No additional management support is needed unless otherwise documented below in the visit note. 

## 2017-01-22 NOTE — Patient Instructions (Signed)
Follow up as needed/scheduled We'll notify you of your lab results and make any changes if needed Continue to drink plenty of fluids Call with any questions or concerns Happy Spring!!!

## 2017-01-22 NOTE — Assessment & Plan Note (Signed)
Chronic problem.  Will repeat labs since stopping B12 injxns and switching to oral supplementation.

## 2017-01-22 NOTE — Assessment & Plan Note (Signed)
Chronic problem.  sxs worsen w/ dehydration- which is what occurred on 2/28.  Repeat BMP to assess Na and K+.  Encouraged increased fluids.  Will follow.

## 2017-01-22 NOTE — Progress Notes (Signed)
   Subjective:    Patient ID: Nicole Crosby, female    DOB: 1966-07-26, 51 y.o.   MRN: [id number]  HPI ER f/u- pt was seen 2/28 for dizziness.  It was thought that pt was dehydrated due to low Na and low K+.  Pt reports she is trying increased fluids.  Due for repeat BMP today.  Pt has only had one day of dizziness since ER evaluation.  Asymptomatic today  B12 deficiency- due for repeat labs since stopping injxns and switching to OTC supplement.   Review of Systems For ROS see HPI     Objective:   Physical Exam  Constitutional: She is oriented to person, place, and time. She appears well-developed and well-nourished. No distress.  HENT:  Head: Normocephalic and atraumatic.  Mouth/Throat: Uvula is midline and mucous membranes are normal.  TMs WNL No TTP over sinuses Minimal nasal congestion  Eyes: Conjunctivae and EOM are normal. Pupils are equal, round, and reactive to light.  Neck: Normal range of motion. Neck supple.  Cardiovascular: Normal rate, regular rhythm, normal heart sounds and intact distal pulses.   Pulmonary/Chest: Effort normal and breath sounds normal. No respiratory distress. She has no wheezes. She has no rales.  Musculoskeletal: She exhibits no edema.  Lymphadenopathy:    She has no cervical adenopathy.  Neurological: She is alert and oriented to person, place, and time. She has normal reflexes. No cranial nerve deficit.  Skin: Skin is warm and dry.  Psychiatric: She has a normal mood and affect. Her behavior is normal. Judgment and thought content normal.  Vitals reviewed.         Assessment & Plan:

## 2017-01-31 ENCOUNTER — Encounter: Payer: Self-pay | Admitting: Family Medicine

## 2017-02-22 ENCOUNTER — Other Ambulatory Visit: Payer: Self-pay | Admitting: Family Medicine

## 2017-02-22 MED FILL — NUVARING VAGINAL RING: 0.12-0.015 | 84 days supply | Qty: 3 | Fill #0

## 2017-03-02 MED FILL — POTASSIUM CL ER 20 MEQ TABL: 20 | 30 days supply | Qty: 60 | Fill #6

## 2017-04-05 ENCOUNTER — Other Ambulatory Visit: Payer: Self-pay | Admitting: Family Medicine

## 2017-04-05 MED FILL — POTASSIUM CL ER 20 MEQ TABL: 20 | 30 days supply | Qty: 60 | Fill #0

## 2017-04-10 ENCOUNTER — Encounter: Payer: Self-pay | Admitting: Family Medicine

## 2017-04-12 NOTE — Telephone Encounter (Signed)
Spoke with patient regarding symptoms.  Patient reports occasional blurry vision x 8 months, has been evaluated by opthalmology, pt reports "they're watching the vessels behind my eyes". Advised to make f/u appointment with opthalmology.  Patient experiences pain and edema in her right middle finger when flexed.  Notices mainly when she wakes up in the middle of the night x 1 year.  Denies injury.  Patient reports left hip pain x 8 months, now going down her leg. Has alternated Ibuprofen and Tylenol without relief. Denies injury.  Has a f/u appointment scheduled for Wednesday, 05/14/2017.

## 2017-04-12 NOTE — Telephone Encounter (Signed)
LM on home and cell phone requesting call back to discuss symptoms.

## 2017-04-14 ENCOUNTER — Ambulatory Visit (INDEPENDENT_AMBULATORY_CARE_PROVIDER_SITE_OTHER): Payer: 59 | Admitting: Family Medicine

## 2017-04-14 ENCOUNTER — Encounter: Payer: Self-pay | Admitting: Family Medicine

## 2017-04-14 VITALS — BP 120/78 | HR 93 | Temp 98.0°F | Resp 16 | Ht 61.0 in | Wt 141.0 lb

## 2017-04-14 DIAGNOSIS — M5416 Radiculopathy, lumbar region: Secondary | ICD-10-CM

## 2017-04-14 DIAGNOSIS — M79644 Pain in right finger(s): Secondary | ICD-10-CM

## 2017-04-14 DIAGNOSIS — I10 Essential (primary) hypertension: Secondary | ICD-10-CM

## 2017-04-14 LAB — BASIC METABOLIC PANEL
BUN: 9 mg/dL (ref 7–25)
CO2: 23 mmol/L (ref 20–31)
Calcium: 9 mg/dL (ref 8.6–10.4)
Chloride: 101 mmol/L (ref 98–110)
Creat: 0.6 mg/dL (ref 0.50–1.05)
Glucose, Bld: 90 mg/dL (ref 65–99)
Potassium: 4.3 mmol/L (ref 3.5–5.3)
Sodium: 135 mmol/L (ref 135–146)

## 2017-04-14 MED ORDER — MELOXICAM 15 MG PO TABS: 15.0000 mg | ORAL_TABLET | Freq: Every day | ORAL | 0 refills | Status: DC

## 2017-04-14 MED FILL — MELOXICAM 15 MG TABLET: 15 | 30 days supply | Qty: 30 | Fill #0

## 2017-04-14 NOTE — Progress Notes (Signed)
Pre visit review using our clinic review tool, if applicable. No additional management support is needed unless otherwise documented below in the visit note. 

## 2017-04-14 NOTE — Progress Notes (Signed)
   Subjective:    Patient ID: Nicole Crosby, female    DOB: 01/16/1966, 51 y.o.   MRN: [id number]  HPI HTN- chronic problem, excellent control today on Losartan HCTZ.  No CP, SOB, HAs, edema.  Pt reports home BPs are also normal.  R middle finger- pt will have pain w/ flexion of finger, primarily over PIP joint.  Worse in AM.  Stiffness improves as day goes on.  No known injury.  No joint deformity.  + family hx of RA.  L hip pain- pt has chronic hip pain since birth of son 26 yrs ago.  Pain will be intermittent.  Some associated numbness down L leg.  Has had 4 injxns.  Has had hip and back MRI 2013.  Pt has seen Dr Lynann Bologna previously but pt is interested in seeing Dr Tamala Julian.   Review of Systems For ROS see HPI     Objective:   Physical Exam  Constitutional: She is oriented to person, place, and time. She appears well-developed and well-nourished. No distress.  HENT:  Head: Normocephalic and atraumatic.  Eyes: Conjunctivae and EOM are normal. Pupils are equal, round, and reactive to light.  Neck: Normal range of motion. Neck supple. No thyromegaly present.  Cardiovascular: Normal rate, regular rhythm, normal heart sounds and intact distal pulses.   No murmur heard. Pulmonary/Chest: Effort normal and breath sounds normal. No respiratory distress.  Abdominal: Soft. She exhibits no distension. There is no tenderness.  Musculoskeletal: She exhibits no edema.  Some thickening of R 3rd PIP joint w/o obvious deformity/nodularity  Lymphadenopathy:    She has no cervical adenopathy.  Neurological: She is alert and oriented to person, place, and time.  Skin: Skin is warm and dry.  Psychiatric: She has a normal mood and affect. Her behavior is normal.  Vitals reviewed.         Assessment & Plan:  R middle finger pain- new.  Pt has some joint thickening and notes AM pain and stiffness.  + family hx of RA.  Check labs.  Start daily NSAID.  Will follow.

## 2017-04-14 NOTE — Patient Instructions (Addendum)
Follow up as scheduled We'll notify you of your lab results and make any changes if needed Start the Meloxicam daily- take w/ food- for inflammation We'll call you with your Sports Med appt Your BP looks great!!!  No changes at this time Call with any questions or concerns Have a great summer!!!

## 2017-04-15 ENCOUNTER — Encounter: Payer: Self-pay | Admitting: Family Medicine

## 2017-04-15 LAB — RHEUMATOID FACTOR: Rhuematoid fact SerPl-aCnc: <14 IU/mL (ref <14)

## 2017-04-15 NOTE — Assessment & Plan Note (Signed)
Deteriorated.  Pt has had multiple injections for this issue.  Interested in seeing Dr Tamala Julian for this issue- referral placed.  Start daily NSAID for sxs relief.  .Pt expressed understanding and is in agreement w/ plan.

## 2017-04-15 NOTE — Assessment & Plan Note (Signed)
Chronic problem, well controlled today.  Asymptomatic.  Check labs.  No anticipated med changes. 

## 2017-04-19 MED FILL — LOSARTAN-HCTZ 50-12.5 MG TA: 50-12.5 | 90 days supply | Qty: 90 | Fill #1

## 2017-05-10 MED FILL — POTASSIUM CL ER 20 MEQ TABL: 20 | 30 days supply | Qty: 60 | Fill #1

## 2017-05-11 NOTE — Progress Notes (Signed)
[facility] [address] [place], [place] [address] Phone: [phone] Subjective:    I'm seeing this patient by the request  of:  Midge Minium, MD   CC: Left hip pain  RDE:[id number]  Nicole Crosby is a 51 y.o. female coming in with complaint of left hip pain.Patient is had this for quite some time. Last injection was she sustains 4 years ago. Has been diagnosed with a greater trochanteric bursitis. Patient states that the pain is worsening over the course of time. Has had more of a gluteal minimus tendinitis as well. States that this is more on the lateral aspect. No radiation down the leg. Has been to physical therapy multiple times. Patient rates the severity of pain as 6 out of 10 and worsening. Waking her up at night. No numbness. Does not respond over-the-counter medications at this time.   previous imaging is from 2013. Patient had MRI of the lumbar spine at that time patient did have a disc protrusion at L3-L4 causing compression of the left L3 nerve root. Did respond fairly well to epidural and nerve root injection. Patient also had an MRI of the left hip at same time that was found to have some very mild left gluteal minimus tendinitis.  Past Medical History:  Diagnosis Date  . Allergy    Buspirone, Sulfa, Pantoprazole  . Benign fundic gland polyps of stomach 02/12/2016  . Chicken pox   . DYSTHYMIA 09/30/2009  . Functional dyspepsia   . GERD (gastroesophageal reflux disease)   . H/O adenomatous polyp of colon 10/04/2014   09/2014 - 3 mm adenoma - repeat colonoscopy 2022  . Hyperlipidemia    no meds  . Hypertension    pt denies  . IBS (irritable bowel syndrome)   . Insomnia   . MEDIAL EPICONDYLITIS, RIGHT 07/17/2010  . 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 Other abnormal glucose    Tiny hypodensity left hepatic lobe-CT of abdomen and pelvis 01/13/9701-[facility] 73mm periumblical hernia  . PAC (premature atrial contraction)   . Palpitations     Past Surgical History:  Procedure Laterality Date  . CESAREAN SECTION  01/2001  . COLONOSCOPY    . DE QUERVAIN'S RELEASE  2000   Right wrist area   Social History   Social History  . Marital status: Married    Spouse name: N/A  . Number of children: 2  . Years of education: N/A   Occupational History  . Xray/CT Tech [employer]   Social History Main Topics  . Smoking status: Never Smoker  . Smokeless tobacco: Never Used  . Alcohol use 0.6 oz/week    1 Glasses of wine per week     Comment: occasional  . Drug use: No  . Sexual activity: Yes    Birth control/ protection: Inserts   Other Topics Concern  . 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    Alcohol Use - yes-wine         Full Time    Married           Allergies  Allergen Reactions  . Buspirone Other (See Comments)     nightmare  . Esomeprazole Other (See Comments)    headache  . Lansoprazole     Abdominal pain  . Omeprazole     Headache  . Sulfonamide Derivatives Rash     Rash   Family History  Problem Relation Age of Onset  . Hypertension Mother   . Hyperlipidemia Mother   . Fibrocystic breast disease Mother   . Hearing loss Mother   . Osteopenia Mother   . Prostate cancer Father 24       deceased  . Hypertension Father   . Diabetes Father   . Kidney disease Father   . Heart disease Father        MI  . Hyperlipidemia Father   . Other Brother        unknown  . Thyroid disease Sister   . Diabetes Sister        pre-diabetes  . Hypertension Sister   . Arthritis Sister   . Diabetes Sister   . Hypertension Sister   . Hyperlipidemia Sister   . Fibrocystic breast disease Sister   . Colon cancer Maternal Uncle        died in late 35's  . Diabetes Maternal Grandmother   . Prostate cancer Maternal Grandfather   . COPD Maternal Grandfather   . Hyperlipidemia Sister   . Hypertension Sister   . Rectal cancer Neg Hx   . Stomach  cancer Neg Hx   . Esophageal cancer Neg Hx   . Pancreatic cancer Neg Hx     Past medical history, social, surgical and family history all reviewed in electronic medical record.  No pertanent information unless stated regarding to the chief complaint.   Review of Systems:Review of systems updated and as accurate as of 05/12/17  No headache, visual changes, nausea, vomiting, diarrhea, constipation, dizziness, abdominal pain, skin rash, fevers, chills, night sweats, weight loss, swollen lymph nodes, body aches, joint swelling, muscle aches, chest pain, shortness of breath, mood changes.   Objective  Blood pressure 110/80, pulse 90, height 5' 1.5" (1.562 m), weight 143 lb (64.9 kg), SpO2 98 %. Systems examined below as of 05/12/17   General: No apparent distress alert and oriented x3 mood and affect normal, dressed appropriately.  HEENT: Pupils equal, extraocular movements intact  Respiratory: Patient's speak in full sentences and does not appear short of breath  Cardiovascular: No lower extremity edema, non tender, no erythema  Skin: Warm dry intact with no signs of infection or rash on extremities or on axial skeleton.  Abdomen: Soft nontender  Neuro: Cranial nerves II through XII are intact, neurovascularly intact in all extremities with 2+ DTRs and 2+ pulses.  Lymph: No lymphadenopathy of posterior or anterior cervical chain or axillae bilaterally.  Gait normal with good balance and coordination.  MSK:  Non tender with full range of motion and good stability and symmetric strength and tone of shoulders, elbows, wrist,  knee and ankles bilaterally.   Left hip exam shows the patient does have severe tenderness over the lateral aspect of the hip. Full strength noted. Patient does have a positive Faber test. Minimal pain over the piriformis syndrome. Patient denies any pain with straight leg test. Minimal discomfort over the left sacroiliac joint   Procedure: Real-time Ultrasound Guided  Injection of left  greater trochanteric bursitis secondary to patient's body habitus Device: GE Logiq Q7  Ultrasound guided injection is preferred based studies that show increased duration, increased effect, greater accuracy, decreased procedural pain, increased response rate, and decreased cost with ultrasound guided versus blind injection.  Verbal informed consent obtained.  Time-out conducted.  Noted no overlying erythema, induration, or other signs of local infection.  Skin prepped in a sterile fashion.  Local anesthesia: Topical Ethyl chloride.  With sterile  technique and under real time ultrasound guidance:  Greater trochanteric area was visualized and patient's bursa was noted. A 22-gauge 3 inch needle was inserted and 4 cc of 0.5% Marcaine and 1 cc of Kenalog 40 mg/dL was injected. Pictures taken Completed without difficulty  Pain immediately resolved suggesting accurate placement of the medication.  Advised to call if fevers/chills, erythema, induration, drainage, or persistent bleeding.  Images permanently stored and available for review in the ultrasound unit.  Impression: Technically successful ultrasound guided injection.  Procedure note 16384; 15 minutes spent for Therapeutic exercises as stated in above notes.  This included exercises focusing on stretching, strengthening, with significant focus on eccentric aspects. Hip strengthening exercises which included:  Pelvic tilt/bracing to help with proper recruitment of the lower abs and pelvic floor muscles  Glute strengthening to properly contract glutes without over-engaging low back and hamstrings - prone hip extension and glute bridge exercises given theraband.  Proper stretching techniques to increase effectiveness for the hip flexors, groin, quads, piriformic and low back when appropriate    Proper technique shown and discussed handout in great detail with ATC.  All questions were discussed and answered.     Impression and  Recommendations:     This case required medical decision making of moderate complexity.      Note: This dictation was prepared with Dragon dictation along with smaller phrase technology. Any transcriptional errors that result from this process are unintentional.

## 2017-05-12 ENCOUNTER — Encounter: Payer: Self-pay | Admitting: Family Medicine

## 2017-05-12 ENCOUNTER — Ambulatory Visit (INDEPENDENT_AMBULATORY_CARE_PROVIDER_SITE_OTHER): Payer: 59 | Admitting: Family Medicine

## 2017-05-12 ENCOUNTER — Ambulatory Visit: Payer: Self-pay

## 2017-05-12 VITALS — BP 110/80 | HR 90 | Ht 61.5 in | Wt 143.0 lb

## 2017-05-12 DIAGNOSIS — M25552 Pain in left hip: Secondary | ICD-10-CM

## 2017-05-12 DIAGNOSIS — M7062 Trochanteric bursitis, left hip: Secondary | ICD-10-CM | POA: Insufficient documentation

## 2017-05-12 MED ORDER — DICLOFENAC SODIUM 2 % TD SOLN: 2.0000 "application " | Freq: Two times a day (BID) | TRANSDERMAL | 3 refills | Status: DC

## 2017-05-12 NOTE — Assessment & Plan Note (Signed)
Patient was given injection today. Home exercises given, topical anti-inflammatory's prescribed. We discussed icing regimen. Differential includes a lumbar radiculopathy and we will monitor with patient's previous MRI. Patient will follow-up with me again in 4 weeks.

## 2017-05-12 NOTE — Patient Instructions (Signed)
Good to see you.  Ice 20 minutes 2 times daily. Usually after activity and before bed. Exercises 3 times a week.  pennsaid pinkie amount topically 2 times daily as needed.  Wear good shoes at all times Wear finger splint at night See me again in 4-6 weeks.

## 2017-05-17 MED FILL — NUVARING VAGINAL RING: 0.12-0.015 | 84 days supply | Qty: 3 | Fill #1

## 2017-06-01 ENCOUNTER — Ambulatory Visit: Payer: 59 | Admitting: Neurology

## 2017-06-07 MED FILL — POTASSIUM CL ER 20 MEQ TABL: 20 | 30 days supply | Qty: 60 | Fill #2

## 2017-07-12 MED FILL — POTASSIUM CL ER 20 MEQ TABL: 20 | 30 days supply | Qty: 60 | Fill #3

## 2017-07-22 ENCOUNTER — Other Ambulatory Visit: Payer: Self-pay | Admitting: Family Medicine

## 2017-07-22 MED FILL — LOSARTAN-HCTZ 50-12.5 MG TA: 50-12.5 | 90 days supply | Qty: 90 | Fill #0

## 2017-07-28 DIAGNOSIS — Z83511 Family history of glaucoma: Secondary | ICD-10-CM | POA: Diagnosis not present

## 2017-07-28 DIAGNOSIS — H40019 Open angle with borderline findings, low risk, unspecified eye: Secondary | ICD-10-CM | POA: Diagnosis not present

## 2017-07-28 DIAGNOSIS — H5203 Hypermetropia, bilateral: Secondary | ICD-10-CM | POA: Diagnosis not present

## 2017-07-28 DIAGNOSIS — H524 Presbyopia: Secondary | ICD-10-CM | POA: Diagnosis not present

## 2017-07-28 DIAGNOSIS — H16223 Keratoconjunctivitis sicca, not specified as Sjogren's, bilateral: Secondary | ICD-10-CM | POA: Diagnosis not present

## 2017-07-28 DIAGNOSIS — H52221 Regular astigmatism, right eye: Secondary | ICD-10-CM | POA: Diagnosis not present

## 2017-08-09 MED FILL — POTASSIUM CL ER 20 MEQ TABL: 20 | 30 days supply | Qty: 60 | Fill #4

## 2017-08-09 MED FILL — NUVARING VAGINAL RING: 0.12-0.015 | 84 days supply | Qty: 3 | Fill #2

## 2017-08-27 ENCOUNTER — Other Ambulatory Visit: Payer: Self-pay | Admitting: Family Medicine

## 2017-08-27 DIAGNOSIS — Z1231 Encounter for screening mammogram for malignant neoplasm of breast: Secondary | ICD-10-CM

## 2017-09-01 ENCOUNTER — Ambulatory Visit (INDEPENDENT_AMBULATORY_CARE_PROVIDER_SITE_OTHER): Payer: 59

## 2017-09-01 DIAGNOSIS — Z1231 Encounter for screening mammogram for malignant neoplasm of breast: Secondary | ICD-10-CM | POA: Diagnosis not present

## 2017-09-02 DIAGNOSIS — H04123 Dry eye syndrome of bilateral lacrimal glands: Secondary | ICD-10-CM | POA: Diagnosis not present

## 2017-09-02 DIAGNOSIS — Z9889 Other specified postprocedural states: Secondary | ICD-10-CM | POA: Insufficient documentation

## 2017-09-02 DIAGNOSIS — H40033 Anatomical narrow angle, bilateral: Secondary | ICD-10-CM | POA: Diagnosis not present

## 2017-09-03 ENCOUNTER — Encounter: Payer: Self-pay | Admitting: General Practice

## 2017-09-08 ENCOUNTER — Ambulatory Visit: Payer: 59

## 2017-09-20 MED FILL — POTASSIUM CL ER 20 MEQ TABL: 20 | 30 days supply | Qty: 60 | Fill #5

## 2017-09-27 ENCOUNTER — Other Ambulatory Visit: Payer: Self-pay

## 2017-09-27 ENCOUNTER — Other Ambulatory Visit (HOSPITAL_BASED_OUTPATIENT_CLINIC_OR_DEPARTMENT_OTHER): Payer: 59

## 2017-09-27 ENCOUNTER — Ambulatory Visit (INDEPENDENT_AMBULATORY_CARE_PROVIDER_SITE_OTHER): Payer: 59 | Admitting: Family Medicine

## 2017-09-27 ENCOUNTER — Encounter: Payer: Self-pay | Admitting: Family Medicine

## 2017-09-27 VITALS — BP 121/78 | HR 85 | Resp 16 | Ht 62.0 in | Wt 141.5 lb

## 2017-09-27 DIAGNOSIS — R0602 Shortness of breath: Secondary | ICD-10-CM

## 2017-09-27 DIAGNOSIS — M79662 Pain in left lower leg: Secondary | ICD-10-CM | POA: Diagnosis not present

## 2017-09-27 DIAGNOSIS — R002 Palpitations: Secondary | ICD-10-CM

## 2017-09-27 DIAGNOSIS — R9431 Abnormal electrocardiogram [ECG] [EKG]: Secondary | ICD-10-CM | POA: Insufficient documentation

## 2017-09-27 NOTE — Assessment & Plan Note (Signed)
Pt has hx of this and has seen Dr Stanford Breed for evaluation in the past.  Reports this is 'different than my usual PACs'.  She has had sxs x8 weeks and these occur randomly throughout the day- independent of exertion and SOB.  Check labs to assess for metabolic cause and refer back to Dr Stanford Breed.  Reviewed supportive care and red flags that should prompt return.  Pt expressed understanding and is in agreement w/ plan.

## 2017-09-27 NOTE — Progress Notes (Signed)
   Subjective:    Patient ID: Nicole Crosby, female    DOB: [birth year], 51 y.o.   MRN: [id number]  HPI Palpitations- pt reports sxs started 'a couple of months ago'.  Occurring every day, throughout the day and feeling different than 'my usual PACs'.  + SOB that results in dizziness.  Not related to exertion- can walk on treadmill w/o difficulty.  Pt doesn't feel that SOB is related to palpitations.    L calf pain- pt reports intermittent pain for 'several weeks'.  L leg 'seems wider' but is not red or warm to touch.     Review of Systems For ROS see HPI     Objective:   Physical Exam  Constitutional: She is oriented to person, place, and time. She appears well-developed and well-nourished. No distress.  HENT:  Head: Normocephalic and atraumatic.  Cardiovascular: Normal rate, regular rhythm, normal heart sounds and intact distal pulses.  Pulmonary/Chest: Effort normal and breath sounds normal. No respiratory distress. She has no wheezes. She has no rales.  Musculoskeletal: She exhibits edema (trace LE edema on L) and tenderness (TTP over L posterior lower leg). She exhibits no deformity.  Neurological: She is alert and oriented to person, place, and time.  Skin: Skin is warm and dry. No erythema.  Psychiatric: She has a normal mood and affect. Her behavior is normal. Thought content normal.  Vitals reviewed.         Assessment & Plan:  Pain in L lower leg- new to provider, ongoing for pt for weeks.  Pain is intermittent but due to posterior leg pain, trace edema, and the fact that she's on birth control (NuvaRing) will get venous doppler to r/o DVT.  Pt expressed understanding and is in agreement w/ plan.   SOB- new.  Occurring independently of exertion.  Able to walk on treadmill w/o difficulty making PE much less likely.  Unclear if this is related to her short PR interval as pt doesn't feel her SOB is related to her palpitations.  Refer to cards.  Will follow closely.  Pt  expressed understanding and is in agreement w/ plan.

## 2017-09-27 NOTE — Assessment & Plan Note (Signed)
Pt has hx of this previously (EKG unchanged from 2015) and she has seen Dr Stanford Breed.  She reports her current palpitations (x8 weeks) are different from her usual PACs.  Check labs to r/o metabolic cause of palpitations and refer back to cardiology for evaluation and tx.  Reviewed supportive care and red flags that should prompt return.  Pt expressed understanding and is in agreement w/ plan.

## 2017-09-27 NOTE — Patient Instructions (Signed)
Follow up as needed or as scheduled We'll notify you of your lab results and make any changes if needed We'll notify you of your doppler appt and results We'll call you with your cardiology appt If you have worsening palpitations, shortness of breath, or other changes, please go to the ER for evaluation Call with any questions or concerns Hang in there!!!

## 2017-09-28 ENCOUNTER — Ambulatory Visit (HOSPITAL_BASED_OUTPATIENT_CLINIC_OR_DEPARTMENT_OTHER)
Admission: RE | Admit: 2017-09-28 | Discharge: 2017-09-28 | Disposition: A | Discharge status: Home / Self Care | Payer: 59 | Source: Ambulatory Visit | Attending: Family Medicine | Admitting: Family Medicine

## 2017-09-28 DIAGNOSIS — R600 Localized edema: Secondary | ICD-10-CM | POA: Diagnosis not present

## 2017-09-28 DIAGNOSIS — M79662 Pain in left lower leg: Secondary | ICD-10-CM | POA: Diagnosis not present

## 2017-09-28 LAB — CBC WITH DIFFERENTIAL/PLATELET
Basophils Absolute: 63 cells/uL (ref 0–200)
Basophils Relative: 0.9 %
Eosinophils Absolute: 182 cells/uL (ref 15–500)
Eosinophils Relative: 2.6 %
HCT: 40.3 % (ref 35.0–45.0)
Hemoglobin: 13.7 g/dL (ref 11.7–15.5)
Lymphs Abs: 2674 cells/uL (ref 850–3900)
MCH: 30.1 pg (ref 27.0–33.0)
MCHC: 34 g/dL (ref 32.0–36.0)
MCV: 88.6 fL (ref 80.0–100.0)
MPV: 9 fL (ref 7.5–12.5)
Monocytes Relative: 7.7 %
Neutro Abs: 3542 cells/uL (ref 1500–7800)
Neutrophils Relative %: 50.6 %
Platelets: 332 10*3/uL (ref 140–400)
RBC: 4.55 10*6/uL (ref 3.80–5.10)
RDW: 11.7 % (ref 11.0–15.0)
Total Lymphocyte: 38.2 %
WBC mixed population: 539 cells/uL (ref 200–950)
WBC: 7 10*3/uL (ref 3.8–10.8)

## 2017-09-28 LAB — BASIC METABOLIC PANEL
BUN: 11 mg/dL (ref 7–25)
CO2: 25 mmol/L (ref 20–32)
Calcium: 9.3 mg/dL (ref 8.6–10.4)
Chloride: 102 mmol/L (ref 98–110)
Creat: 0.67 mg/dL (ref 0.50–1.05)
Glucose, Bld: 86 mg/dL (ref 65–99)
Potassium: 3.4 mmol/L — ABNORMAL LOW (ref 3.5–5.3)
Sodium: 137 mmol/L (ref 135–146)

## 2017-09-28 LAB — TSH: TSH: 1.12 mIU/L

## 2017-10-01 DIAGNOSIS — H40031 Anatomical narrow angle, right eye: Secondary | ICD-10-CM | POA: Diagnosis not present

## 2017-10-08 ENCOUNTER — Encounter: Payer: Self-pay | Admitting: Family Medicine

## 2017-10-21 MED FILL — LOSARTAN POTASSIUM-HCTZ 50-: 50-12.5 | 90 days supply | Qty: 90 | Fill #1

## 2017-10-21 MED FILL — POTASSIUM CL ER 20 MEQ TABL: 20 | 30 days supply | Qty: 60 | Fill #6

## 2017-10-25 MED FILL — XIIDRA 5% EYE DROPS: 5 | 30 days supply | Qty: 60 | Fill #0

## 2017-10-29 ENCOUNTER — Encounter: Payer: 59 | Admitting: Family Medicine

## 2017-11-01 MED FILL — NUVARING VAGINAL RING: 0.12-0.015 | 84 days supply | Qty: 3 | Fill #3

## 2017-11-03 ENCOUNTER — Ambulatory Visit (INDEPENDENT_AMBULATORY_CARE_PROVIDER_SITE_OTHER): Payer: 59 | Admitting: Family Medicine

## 2017-11-03 ENCOUNTER — Other Ambulatory Visit: Payer: Self-pay

## 2017-11-03 ENCOUNTER — Encounter: Payer: Self-pay | Admitting: Family Medicine

## 2017-11-03 VITALS — BP 124/80 | HR 83 | Temp 98.8°F | Resp 16 | Ht 62.0 in | Wt 142.5 lb

## 2017-11-03 DIAGNOSIS — I10 Essential (primary) hypertension: Secondary | ICD-10-CM | POA: Diagnosis not present

## 2017-11-03 DIAGNOSIS — Z0000 Encounter for general adult medical examination without abnormal findings: Secondary | ICD-10-CM

## 2017-11-03 DIAGNOSIS — E559 Vitamin D deficiency, unspecified: Secondary | ICD-10-CM

## 2017-11-03 NOTE — Patient Instructions (Addendum)
Follow up in 6 months to recheck BP We'll notify you of your lab results and make any changes if needed Keep up the good work on healthy diet and regular exercise- you look great! Call your eye doctor Call with any questions or concerns Happy New Year!!!

## 2017-11-03 NOTE — Assessment & Plan Note (Signed)
Chronic problem.  Adequate control today.  Asymptomatic.  Check labs.  No anticipated med changes. 

## 2017-11-03 NOTE — Progress Notes (Signed)
   Subjective:    Patient ID: Nicole Crosby, female    DOB: [birth year], 52 y.o.   MRN: [id number]  HPI CPE- UTD on GYN, colonoscopy, immunizations.   Review of Systems Patient reports no vision/ hearing changes, adenopathy,fever, weight change,  persistant/recurrent hoarseness , swallowing issues, chest pain, palpitations, edema, persistant/recurrent cough, hemoptysis, dyspnea (rest/exertional/paroxysmal nocturnal), gastrointestinal bleeding (melena, rectal bleeding), abdominal pain, significant heartburn, bowel changes, GU symptoms (dysuria, hematuria, incontinence), Gyn symptoms (abnormal  bleeding, pain),  syncope, focal weakness, memory loss, numbness & tingling, skin/hair/nail changes, abnormal bruising or bleeding, anxiety, or depression.     Objective:   Physical Exam General Appearance:    Alert, cooperative, no distress, appears stated age  Head:    Normocephalic, without obvious abnormality, atraumatic  Eyes:    PERRL, conjunctiva/corneas clear, EOM's intact, fundi    benign, both eyes  Ears:    Normal TM's and external ear canals, both ears  Nose:   Nares normal, septum midline, mucosa normal, no drainage    or sinus tenderness  Throat:   Lips, mucosa, and tongue normal; teeth and gums normal  Neck:   Supple, symmetrical, trachea midline, no adenopathy;    Thyroid: no enlargement/tenderness/nodules  Back:     Symmetric, no curvature, ROM normal, no CVA tenderness  Lungs:     Clear to auscultation bilaterally, respirations unlabored  Chest Wall:    No tenderness or deformity   Heart:    Regular rate and rhythm, S1 and S2 normal, no murmur, rub   or gallop  Breast Exam:    Deferred to mammo  Abdomen:     Soft, non-tender, bowel sounds active all four quadrants,    no masses, no organomegaly  Genitalia:    Deferred  Rectal:    Extremities:   Extremities normal, atraumatic, no cyanosis or edema  Pulses:   2+ and symmetric all extremities  Skin:   Skin color, texture,  turgor normal, no rashes or lesions  Lymph nodes:   Cervical, supraclavicular, and axillary nodes normal  Neurologic:   CNII-XII intact, normal strength, sensation and reflexes    throughout          Assessment & Plan:

## 2017-11-03 NOTE — Assessment & Plan Note (Signed)
Pt has hx of this.  Check labs and replete prn. 

## 2017-11-03 NOTE — Assessment & Plan Note (Signed)
Pt's PE WNL.  UTD on pap, mammo, colonoscopy.  UTD on immunizations.  Check labs.  Anticipatory guidance provided.

## 2017-11-04 ENCOUNTER — Encounter: Payer: Self-pay | Admitting: General Practice

## 2017-11-04 LAB — LIPID PANEL
Cholesterol: 204 mg/dL — ABNORMAL HIGH (ref <200)
HDL: 68 mg/dL (ref >50)
LDL Cholesterol (Calc): 114 mg/dL (calc) — ABNORMAL HIGH
Non-HDL Cholesterol (Calc): 136 mg/dL (calc) — ABNORMAL HIGH (ref <130)
Total CHOL/HDL Ratio: 3 (calc) (ref <5.0)
Triglycerides: 117 mg/dL (ref <150)

## 2017-11-04 LAB — BASIC METABOLIC PANEL
BUN: 13 mg/dL (ref 7–25)
CO2: 19 mmol/L — ABNORMAL LOW (ref 20–32)
Calcium: 9.1 mg/dL (ref 8.6–10.4)
Chloride: 100 mmol/L (ref 98–110)
Creat: 0.79 mg/dL (ref 0.50–1.05)
Glucose, Bld: 91 mg/dL (ref 65–99)
Potassium: 4 mmol/L (ref 3.5–5.3)
Sodium: 135 mmol/L (ref 135–146)

## 2017-11-04 LAB — CBC WITH DIFFERENTIAL/PLATELET
Basophils Absolute: 77 cells/uL (ref 0–200)
Basophils Relative: 0.9 %
Eosinophils Absolute: 136 cells/uL (ref 15–500)
Eosinophils Relative: 1.6 %
HCT: 42 % (ref 35.0–45.0)
Hemoglobin: 13.8 g/dL (ref 11.7–15.5)
Lymphs Abs: 3587 cells/uL (ref 850–3900)
MCH: 29.2 pg (ref 27.0–33.0)
MCHC: 32.9 g/dL (ref 32.0–36.0)
MCV: 88.8 fL (ref 80.0–100.0)
MPV: 9 fL (ref 7.5–12.5)
Monocytes Relative: 6.3 %
Neutro Abs: 4165 cells/uL (ref 1500–7800)
Neutrophils Relative %: 49 %
Platelets: 338 10*3/uL (ref 140–400)
RBC: 4.73 10*6/uL (ref 3.80–5.10)
RDW: 11.7 % (ref 11.0–15.0)
Total Lymphocyte: 42.2 %
WBC mixed population: 536 cells/uL (ref 200–950)
WBC: 8.5 10*3/uL (ref 3.8–10.8)

## 2017-11-04 LAB — HEPATIC FUNCTION PANEL
AG Ratio: 1.3 (calc) (ref 1.0–2.5)
ALT: 10 U/L (ref 6–29)
AST: 13 U/L (ref 10–35)
Albumin: 4.1 g/dL (ref 3.6–5.1)
Alkaline phosphatase (APISO): 51 U/L (ref 33–130)
Bilirubin, Direct: 0.1 mg/dL (ref 0.0–0.2)
Globulin: 3.2 g/dL (calc) (ref 1.9–3.7)
Indirect Bilirubin: 0.2 mg/dL (calc) (ref 0.2–1.2)
Total Bilirubin: 0.3 mg/dL (ref 0.2–1.2)
Total Protein: 7.3 g/dL (ref 6.1–8.1)

## 2017-11-04 LAB — TSH: TSH: 0.88 mIU/L

## 2017-11-04 LAB — VITAMIN D 25 HYDROXY (VIT D DEFICIENCY, FRACTURES): Vit D, 25-Hydroxy: 41 ng/mL (ref 30–100)

## 2017-11-24 DIAGNOSIS — H40019 Open angle with borderline findings, low risk, unspecified eye: Secondary | ICD-10-CM | POA: Diagnosis not present

## 2017-11-29 ENCOUNTER — Other Ambulatory Visit: Payer: Self-pay | Admitting: Family Medicine

## 2017-11-29 MED FILL — POTASSIUM CL ER 20 MEQ TABL: 20 | 30 days supply | Qty: 60 | Fill #0

## 2017-11-29 NOTE — Progress Notes (Deleted)
Referring-Katherine Birdie Riddle, MD Reason for referral-Palpitations  HPI: 52 yo female for evaluation of palpitations at request of Annye Asa MD. Previously seen but not since 5/15. Echocardiogram in August of 2010 that revealed normal LV function. No valvular abnormalities were noted. Stress echocardiogram was performed on October 10, 2009 and was normal. A CardioNet was also performed and showed sinus to sinus tachycardia with rare PAC. Stress echo repeated 6/15 normal.   Current Outpatient Medications  Medication Sig Dispense Refill  . Calcium Carbonate-Vit D-Min (CALCIUM 1200 PO) Take 1 capsule by mouth daily.    . cholecalciferol (VITAMIN D) 1000 UNITS tablet Take 1,000 Units by mouth daily.    Marland Kitchen losartan-hydrochlorothiazide (HYZAAR) 50-12.5 MG tablet TAKE 1 TABLET BY MOUTH DAILY. 90 tablet 1  . meclizine (ANTIVERT) 25 MG tablet Take 25 mg by mouth 3 (three) times daily as needed for dizziness.    Marland Kitchen NUVARING 0.12-0.015 MG/24HR vaginal ring INSERT 1 RING VAGINALLY AND LEAVE IN PLACE FOR 3 CONSECUTIVE WEEKS, THEN REMOVE FOR 1 WEEK. 1 each 11  . potassium chloride SA (K-DUR,KLOR-CON) 20 MEQ tablet TAKE 1 TABLET BY MOUTH TWICE DAILY 60 tablet 6  . vitamin B-12 (CYANOCOBALAMIN) 500 MCG tablet Take 500 mcg by mouth daily.    Marland Kitchen XIIDRA 5 % SOLN   11   No current facility-administered medications for this visit.     Allergies  Allergen Reactions  . Buspirone Other (See Comments)     nightmare  . Esomeprazole Other (See Comments)    headache  . Lansoprazole     Abdominal pain  . Omeprazole     Headache  . Sulfonamide Derivatives Rash     Rash    Past Medical History:  Diagnosis Date  . Allergy    Buspirone, Sulfa, Pantoprazole  . Benign fundic gland polyps of stomach 02/12/2016  . Chicken pox   . DYSTHYMIA 09/30/2009  . Functional dyspepsia   . GERD (gastroesophageal reflux disease)   . H/O adenomatous polyp of colon 10/04/2014   09/2014 - 3 mm adenoma - repeat  colonoscopy 2022  . Hyperlipidemia    no meds  . Hypertension    pt denies  . IBS (irritable bowel syndrome)   . Insomnia   . MEDIAL EPICONDYLITIS, RIGHT 07/17/2010  . 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 Other abnormal glucose    Tiny hypodensity left hepatic lobe-CT of abdomen and pelvis 0/93/2355-[id number] 56mm periumblical hernia  . PAC (premature atrial contraction)   . Palpitations     Past Surgical History:  Procedure Laterality Date  . CESAREAN SECTION  01/2001  . COLONOSCOPY    . [place] RELEASE  2000   Right wrist area    Social History   Socioeconomic History  . Marital status: Married    Spouse name: Not on file  . Number of children: 2  . Years of education: Not on file  . Highest education level: Not on file  Social Needs  . Financial resource strain: Not on file  . Food insecurity - worry: Not on file  . Food insecurity - inability: Not on file  . Transportation needs - medical: Not on file  . Transportation needs - non-medical: Not on file  Occupational History  . Occupation: Xray/CT Engineer, production: [employer]  Tobacco Use  . Smoking status: Never Smoker  . Smokeless tobacco: Never Used  Substance and Sexual Activity  . Alcohol use: Yes    Alcohol/week: 0.6 oz  Types: 1 Glasses of wine per week    Comment: occasional  . Drug use: No  . Sexual activity: Yes    Birth control/protection: Inserts  Other Topics Concern  . 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    Alcohol Use - yes-wine         Full Time    Married            Family History  Problem Relation Age of Onset  . Hypertension Mother   . Hyperlipidemia Mother   . Fibrocystic breast disease Mother   . Hearing loss Mother   . Osteopenia Mother   . Prostate cancer Father 52       deceased  . Hypertension Father   . Diabetes Father   . Kidney disease Father   . Heart disease Father        MI  . Hyperlipidemia  Father   . Other Brother        unknown  . Thyroid disease Sister   . Diabetes Sister        pre-diabetes  . Hypertension Sister   . Arthritis Sister   . Diabetes Sister   . Hypertension Sister   . Hyperlipidemia Sister   . Fibrocystic breast disease Sister   . Colon cancer Maternal Uncle        died in late 82's  . Diabetes Maternal Grandmother   . Prostate cancer Maternal Grandfather   . COPD Maternal Grandfather   . Hyperlipidemia Sister   . Hypertension Sister   . Rectal cancer Neg Hx   . Stomach cancer Neg Hx   . Esophageal cancer Neg Hx   . Pancreatic cancer Neg Hx     ROS: no fevers or chills, productive cough, hemoptysis, dysphasia, odynophagia, melena, hematochezia, dysuria, hematuria, rash, seizure activity, orthopnea, PND, pedal edema, claudication. Remaining systems are negative.  Physical Exam:   There were no vitals taken for this visit.  General:  Well developed/well nourished in NAD Skin warm/dry Patient not depressed No peripheral clubbing Back-normal HEENT-normal/normal eyelids Neck supple/normal carotid upstroke bilaterally; no bruits; no JVD; no thyromegaly chest - CTA/ normal expansion CV - RRR/normal S1 and S2; no murmurs, rubs or gallops;  PMI nondisplaced Abdomen -NT/ND, no HSM, no mass, + bowel sounds, no bruit 2+ femoral pulses, no bruits Ext-no edema, chords, 2+ DP Neuro-grossly nonfocal  ECG - personally reviewed  A/P  1  Kirk Ruths, MD

## 2017-12-08 ENCOUNTER — Ambulatory Visit: Payer: 59 | Admitting: Cardiology

## 2017-12-16 MED FILL — OSCIMIN SL 0.125 MG TABLET: 0.125 | 10 days supply | Qty: 60 | Fill #1

## 2017-12-19 ENCOUNTER — Other Ambulatory Visit: Payer: Self-pay

## 2017-12-19 ENCOUNTER — Emergency Department (HOSPITAL_BASED_OUTPATIENT_CLINIC_OR_DEPARTMENT_OTHER)
Admission: EM | Admit: 2017-12-19 | Discharge: 2017-12-19 | Disposition: A | Discharge status: Home / Self Care | Payer: 59 | Attending: Emergency Medicine | Admitting: Emergency Medicine

## 2017-12-19 ENCOUNTER — Encounter (HOSPITAL_BASED_OUTPATIENT_CLINIC_OR_DEPARTMENT_OTHER): Payer: Self-pay | Admitting: Emergency Medicine

## 2017-12-19 DIAGNOSIS — R9431 Abnormal electrocardiogram [ECG] [EKG]: Secondary | ICD-10-CM

## 2017-12-19 DIAGNOSIS — I10 Essential (primary) hypertension: Secondary | ICD-10-CM | POA: Insufficient documentation

## 2017-12-19 DIAGNOSIS — R42 Dizziness and giddiness: Secondary | ICD-10-CM

## 2017-12-19 DIAGNOSIS — Z79899 Other long term (current) drug therapy: Secondary | ICD-10-CM | POA: Insufficient documentation

## 2017-12-19 DIAGNOSIS — E876 Hypokalemia: Secondary | ICD-10-CM

## 2017-12-19 DIAGNOSIS — R519 Headache, unspecified: Secondary | ICD-10-CM

## 2017-12-19 DIAGNOSIS — R51 Headache: Secondary | ICD-10-CM | POA: Insufficient documentation

## 2017-12-19 LAB — CBC
HCT: 46 % (ref 36.0–46.0)
Hemoglobin: 15.3 g/dL — ABNORMAL HIGH (ref 12.0–15.0)
MCH: 30.4 pg (ref 26.0–34.0)
MCHC: 33.3 g/dL (ref 30.0–36.0)
MCV: 91.3 fL (ref 78.0–100.0)
Platelets: 338 10*3/uL (ref 150–400)
RBC: 5.04 MIL/uL (ref 3.87–5.11)
RDW: 12.9 % (ref 11.5–15.5)
WBC: 9 10*3/uL (ref 4.0–10.5)

## 2017-12-19 LAB — BASIC METABOLIC PANEL
Anion gap: 11 (ref 5–15)
BUN: 10 mg/dL (ref 6–20)
CO2: 25 mmol/L (ref 22–32)
Calcium: 9.4 mg/dL (ref 8.9–10.3)
Chloride: 99 mmol/L — ABNORMAL LOW (ref 101–111)
Creatinine, Ser: 0.81 mg/dL (ref 0.44–1.00)
GFR calc Af Amer: >60 mL/min (ref >60)
GFR calc non Af Amer: >60 mL/min (ref >60)
Glucose, Bld: 124 mg/dL — ABNORMAL HIGH (ref 65–99)
Potassium: 2.8 mmol/L — ABNORMAL LOW (ref 3.5–5.1)
Sodium: 135 mmol/L (ref 135–145)

## 2017-12-19 LAB — TROPONIN I: Troponin I: <0.03 ng/mL (ref <0.03)

## 2017-12-19 MED ORDER — ONDANSETRON HCL 4 MG/2ML IJ SOLN
4.0000 mg | Freq: Once | INTRAMUSCULAR | Status: AC
  Administered 2017-12-19: 4 mg via INTRAVENOUS
  Filled 2017-12-19: qty 2

## 2017-12-19 MED ORDER — DIAZEPAM 5 MG PO TABS: 5.0000 mg | ORAL_TABLET | Freq: Three times a day (TID) | ORAL | 0 refills | Status: DC | PRN

## 2017-12-19 MED ORDER — POTASSIUM CHLORIDE CRYS ER 20 MEQ PO TBCR
40.0000 meq | EXTENDED_RELEASE_TABLET | Freq: Once | ORAL | Status: AC
  Administered 2017-12-19: 40 meq via ORAL
  Filled 2017-12-19: qty 2

## 2017-12-19 MED ORDER — SODIUM CHLORIDE 0.9 % IV BOLUS (SEPSIS)
1000.0000 mL | Freq: Once | INTRAVENOUS | Status: AC
  Administered 2017-12-19: 1000 mL via INTRAVENOUS

## 2017-12-19 MED ORDER — DIAZEPAM 5 MG/ML IJ SOLN
5.0000 mg | Freq: Once | INTRAMUSCULAR | Status: AC
  Administered 2017-12-19: 5 mg via INTRAVENOUS
  Filled 2017-12-19: qty 2

## 2017-12-19 NOTE — Discharge Instructions (Signed)
Please read and follow all provided instructions.  Your diagnoses today include:  1. Vertigo   2. Hypokalemia   3. Acute nonintractable headache, unspecified headache type   4. Abnormal EKG     Tests performed today include:  EKG -shows some changes in your T waves that were not present before  Troponin test for heart muscle irritation -normal  Blood counts -suggest mild dehydration  Electrolytes -shows low potassium, you were given extra potassium today  Vital signs. See below for your results today.   Medications prescribed:   Valium - medication for vertigo  DO NOT drive or perform any activities that require you to be awake and alert because this medicine can make you drowsy.   Take any prescribed medications only as directed.  Home care instructions:  Follow any educational materials contained in this packet.  BE VERY CAREFUL not to take multiple medicines containing Tylenol (also called acetaminophen). Doing so can lead to an overdose which can damage your liver and cause liver failure and possibly death.   Follow-up instructions: Please follow-up with your primary care provider in the next 3 days for further evaluation of your symptoms.   Return instructions:   Please return to the Emergency Department if you experience worsening symptoms.   Return if you have weakness in your arms or legs, slurred speech, trouble walking or talking, confusion, or trouble with your balance.   Return with worsening severe headache especially if you have persistent vomiting.  Please return if you have any other emergent concerns.  Additional Information:  Your vital signs today were: BP 127/72 (BP Location: Right Arm)    Pulse 74    Temp 97.9 F (36.6 C) (Oral)    Resp 18    Ht 5' 1.5" (1.562 m)    Wt 61.7 kg (136 lb)    LMP 12/06/2017    SpO2 100%    BMI 25.28 kg/m  If your blood pressure (BP) was elevated above 135/85 this visit, please have this repeated by your doctor  within one month. --------------

## 2017-12-19 NOTE — ED Triage Notes (Addendum)
Dizziness with movement, headache and vomiting since yesterday. Pt reports taking meclizine without relief.

## 2017-12-19 NOTE — ED Notes (Signed)
Pt teaching provided on medications that may cause drowsiness. Pt instructed not to drive or operate heavy machinery while taking the prescribed medication. Pt verbalized understanding.   

## 2017-12-19 NOTE — ED Provider Notes (Signed)
[facility] Provider Note   CSN: [id number] Arrival date & time: 12/19/17  1127     History   Chief Complaint Chief Complaint  Patient presents with  . Dizziness    HPI Nicole Crosby is a 52 y.o. female.  Patient with history of equilibrium disorder, previously worked up for this, symptoms typically controlled with meclizine --presents with episode of dizziness starting yesterday at lunch.  Her typical meclizine has not been helping.  She complains of with neck pain, and headache described as fullness in the back of her head.  Symptoms are similar to what she described during ED visit on 12/30/2016 where she was hydrated, given Valium and Zofran, had a CT of the head which was negative.  She does remember these treatments helping her.  Patient states that symptoms are bit different today because they are worsened with motion of her head.  Symptoms became acutely worse when she rolled over in bed today.  She denies any associated vomiting.  No recent fevers, confusion.  She is able to ambulate but her husband noticed that she is more unsteady than usual.  Patient denies other signs of stroke including: facial droop, slurred speech, aphasia, weakness/numbness in extremities.  No recent URI symptoms, ear pain, or sore throat.  Patient denies any head trauma.         Past Medical History:  Diagnosis Date  . Allergy    Buspirone, Sulfa, Pantoprazole  . Benign fundic gland polyps of stomach 02/12/2016  . Chicken pox   . DYSTHYMIA 09/30/2009  . Functional dyspepsia   . GERD (gastroesophageal reflux disease)   . H/O adenomatous polyp of colon 10/04/2014   09/2014 - 3 mm adenoma - repeat colonoscopy 2022  . Hyperlipidemia    no meds  . Hypertension    pt denies  . IBS (irritable bowel syndrome)   . Insomnia   . MEDIAL EPICONDYLITIS, RIGHT 07/17/2010  . 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 Other abnormal glucose    Tiny hypodensity left hepatic lobe-CT of  abdomen and pelvis 9/92/4268-[id number] 47mm periumblical hernia  . PAC (premature atrial contraction)   . Palpitations     Patient Active Problem List   Diagnosis Date Noted  . Vitamin D deficiency 11/03/2017  . Shortened PR interval 09/27/2017  . Greater trochanteric bursitis of left hip 05/12/2017  . B12 deficiency 01/22/2017  . Memory changes 08/28/2016  . 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 Benign fundic gland polyps of stomach 02/12/2016  . Paresthesia 12/17/2015  . Bladder spasm 12/13/2014  . H/O adenomatous polyp of colon 10/04/2014  . Fatigue 09/03/2014  . Chest pain 03/14/2014  . Decreased hearing of right ear 02/05/2014  . Other malaise and fatigue 01/11/2014  . Maxillary sinusitis 10/13/2013  . Screening for malignant neoplasm of cervix 05/31/2013  . Physical exam 05/31/2013  . Pelvic pain in female 05/31/2013  . Left L3 lumbar radiculitis 09/23/2012  . Neck pain 08/22/2012  . Gait disturbance 08/16/2012  . Adrenal nodule ([place]) 03/02/2012  . Essential hypertension, benign 03/02/2012  . IBS (irritable bowel syndrome)   . PAC (premature atrial contraction)   . Insomnia   . GERD (gastroesophageal reflux disease)   . DYSTHYMIA 09/30/2009  . HEADACHE 09/21/2008  . HEPATIC CYST 08/14/2008  . Hyperlipidemia 08/06/2008    Past Surgical History:  Procedure Laterality Date  . CESAREAN SECTION  01/2001  . COLONOSCOPY    . [place] RELEASE  2000  Right wrist area    OB History    Gravida Para Term Preterm AB Living   2 2           SAB TAB Ectopic Multiple Live Births                   Home Medications    Prior to Admission medications   Medication Sig Start Date End Date Taking? Authorizing Provider  Calcium Carbonate-Vit D-Min (CALCIUM 1200 PO) Take 1 capsule by mouth daily.    [provider]  cholecalciferol (VITAMIN D) 1000 UNITS tablet Take 1,000 Units by mouth daily.    [provider]    losartan-hydrochlorothiazide (HYZAAR) 50-12.5 MG tablet TAKE 1 TABLET BY MOUTH DAILY. 07/22/17   Midge Minium, MD  meclizine (ANTIVERT) 25 MG tablet Take 25 mg by mouth 3 (three) times daily as needed for dizziness.    [provider]  NUVARING 0.12-0.015 MG/24HR vaginal ring INSERT 1 RING VAGINALLY AND LEAVE IN PLACE FOR 3 CONSECUTIVE WEEKS, THEN REMOVE FOR 1 WEEK. 02/22/17   Midge Minium, MD  potassium chloride SA (K-DUR,KLOR-CON) 20 MEQ tablet TAKE 1 TABLET BY MOUTH TWICE DAILY 11/29/17   Midge Minium, MD  vitamin B-12 (CYANOCOBALAMIN) 500 MCG tablet Take 500 mcg by mouth daily.    [provider]  Shirley Friar 5 % SOLN  10/25/17   [provider]    Family History Family History  Problem Relation Age of Onset  . Hypertension Mother   . Hyperlipidemia Mother   . Fibrocystic breast disease Mother   . Hearing loss Mother   . Osteopenia Mother   . Prostate cancer Father 58       deceased  . Hypertension Father   . Diabetes Father   . Kidney disease Father   . Heart disease Father        MI  . Hyperlipidemia Father   . Other Brother        unknown  . Thyroid disease Sister   . Diabetes Sister        pre-diabetes  . Hypertension Sister   . Arthritis Sister   . Diabetes Sister   . Hypertension Sister   . Hyperlipidemia Sister   . Fibrocystic breast disease Sister   . Colon cancer Maternal Uncle        died in late 32's  . Diabetes Maternal Grandmother   . Prostate cancer Maternal Grandfather   . COPD Maternal Grandfather   . Hyperlipidemia Sister   . Hypertension Sister   . Rectal cancer Neg Hx   . Stomach cancer Neg Hx   . Esophageal cancer Neg Hx   . Pancreatic cancer Neg Hx     Social History Social History   Tobacco Use  . Smoking status: Never Smoker  . Smokeless tobacco: Never Used  Substance Use Topics  . Alcohol use: Yes    Alcohol/week: 0.6 oz    Types: 1 Glasses of wine per week    Comment: occasional  . Drug  use: No     Allergies   Buspirone; Esomeprazole; Lansoprazole; Omeprazole; and Sulfonamide derivatives   Review of Systems Review of Systems  Constitutional: Negative for fever.  HENT: Negative for congestion, dental problem, rhinorrhea and sinus pressure.   Eyes: Negative for photophobia, discharge, redness and visual disturbance.  Respiratory: Negative for shortness of breath.   Cardiovascular: Negative for chest pain.  Gastrointestinal: Negative for nausea and vomiting.  Musculoskeletal: Positive for neck  pain. Negative for gait problem and neck stiffness.  Skin: Negative for rash.  Neurological: Positive for dizziness and headaches. Negative for syncope, facial asymmetry, speech difficulty, weakness, light-headedness and numbness.  Psychiatric/Behavioral: Negative for confusion.     Physical Exam Updated Vital Signs BP (!) 148/93 (BP Location: Right Arm)   Pulse 97   Temp 97.9 F (36.6 C) (Oral)   Resp 16   Ht 5' 1.5" (1.562 m)   Wt 61.7 kg (136 lb)   LMP 12/06/2017   SpO2 100%   BMI 25.28 kg/m   Physical Exam  Constitutional: She is oriented to person, place, and time. She appears well-developed and well-nourished.  HENT:  Head: Normocephalic and atraumatic.  Right Ear: Tympanic membrane, external ear and ear canal normal.  Left Ear: Tympanic membrane, external ear and ear canal normal.  Nose: Nose normal.  Mouth/Throat: Uvula is midline, oropharynx is clear and moist and mucous membranes are normal.  Eyes: Conjunctivae, EOM and lids are normal. Pupils are equal, round, and reactive to light. Right eye exhibits no discharge. Left eye exhibits no discharge. Right eye exhibits no nystagmus. Left eye exhibits no nystagmus.  + nystagmus, fast phase to right.  No vertical or rotary nystagmus.  Neck: Normal range of motion. Neck supple.  Cardiovascular: Normal rate, regular rhythm and normal heart sounds.  Pulmonary/Chest: Effort normal and breath sounds normal. No  stridor. No respiratory distress. She has no wheezes.  Abdominal: Soft. There is no tenderness.  Musculoskeletal:       Cervical back: She exhibits normal range of motion, no tenderness and no bony tenderness.  Neurological: She is alert and oriented to person, place, and time. She has normal strength and normal reflexes. No cranial nerve deficit or sensory deficit. Coordination normal. GCS eye subscore is 4. GCS verbal subscore is 5. GCS motor subscore is 6.  Skin: Skin is warm and dry.  Psychiatric: She has a normal mood and affect.  Nursing note and vitals reviewed.    ED Treatments / Results  Labs (all labs ordered are listed, but only abnormal results are displayed) Labs Reviewed  CBC - Abnormal; Notable for the following components:      Result Value   Hemoglobin 15.3 (*)    All other components within normal limits  BASIC METABOLIC PANEL - Abnormal; Notable for the following components:   Potassium 2.8 (*)    Chloride 99 (*)    Glucose, Bld 124 (*)    All other components within normal limits  TROPONIN I    EKG  EKG Interpretation  Date/Time:  Sunday December 19 2017 11:52:52 EST Ventricular Rate:  97 PR Interval:  128 QRS Duration: 74 QT Interval:  344 QTC Calculation: 436 R Axis:   57 Text Interpretation:  Normal sinus rhythm T wave abnormality, consider inferior ischemia T wave abnormality, consider anterolateral ischemia Abnormal ECG T wave inversions inferiorly and flattening in precordial leads Confirmed by Theotis Burrow [phone]) on 12/19/2017 12:14:16 PM       Radiology No results found.  Procedures Procedures (including critical care time)  Medications Ordered in ED Medications  sodium chloride 0.9 % bolus 1,000 mL (not administered)  diazepam (VALIUM) injection 5 mg (not administered)  ondansetron (ZOFRAN) injection 4 mg (not administered)     Initial Impression / Assessment and Plan / ED Course  I have reviewed the triage vital signs and the  nursing notes.  Pertinent labs & imaging results that were available during my care of the  patient were reviewed by me and considered in my medical decision making (see chart for details).     Patient seen and examined. EKG reviewed with changes. No CP or other anginal equivalents on H&P. Work-up initiated. Medications ordered.   Overall, symptoms seem very similar to what was happening last year on 2/28 with a more reproducible component today.  No focal neurological deficits.  Will reassess after treatment.  Vital signs reviewed and are as follows: BP (!) 148/93 (BP Location: Right Arm)   Pulse 97   Temp 97.9 F (36.6 C) (Oral)   Resp 16   Ht 5' 1.5" (1.562 m)   Wt 61.7 kg (136 lb)   LMP 12/06/2017   SpO2 100%   BMI 25.28 kg/m   2:50 PM patient was feeling better after fluids and Valium.  She walked well without dizziness but felt a little sleepy.  She complains of a slightly worsening headache that she states is probably due because she is hungry.  She has not developed any neuro deficits, meningismus, vomiting in the interim.  She will take 64mEq of potassium tonight because she did not take any this morning.  She will have this rechecked by her primary care physician.  Will give short course of Valium to use if symptoms do not respond to meclizine.  Given new EKG changes, patient encouraged not to ignore any signs of heart problems such as shortness of breath, chest pain or pressure.  She states that she recently canceled an appointment with a cardiologist but she does not remember what symptoms she was having.  She states that she will reschedule this.  We discussed that given her history and similarity of symptoms today to those in the past, do not feel that advanced imaging is indicated.  Patient is in agreement.  Low concern for intracranial hemorrhage.  Patient counseled to return if they have weakness in their arms or legs, slurred speech, trouble walking or talking,  confusion, trouble with their balance, or if they have any other concerns. Patient verbalizes understanding and agrees with plan.    Final Clinical Impressions(s) / ED Diagnoses   Final diagnoses:  Vertigo  Hypokalemia  Acute nonintractable headache, unspecified headache type  Abnormal EKG   Patient with long history of equilibrium problems presents with vertigo with nystagmus today.  Symptoms fairly similar to when she was seen a year ago.  Patient complains of some occipital fullness but no severe headaches.  No neuro deficits.  She feels better after IV Valium and hydration.  Symptoms are most consistent with her previous history and I do not feel that advanced imaging imaging including MRI is indicated today.  Do not suspect that the patient has meningitis or intracranial hemorrhage.  No meningismus or neck stiffness.  Hypokalemia: Likely secondary to blood pressure medications.  Patient is already on supplemental potassium.  She was given extra here in emergency department today.  Abnormal EKG: New T wave inversions noted today.  Patient does not have any symptoms suggestive of ACS.  Troponin is negative.  She will follow-up with cardiologist.  HA: Patient without high-risk features of headache including: sudden onset/thunderclap HA, no similar headache in past, altered mental status, accompanying seizure, headache with exertion, history of immunocompromise, neck or shoulder pain, fever, use of anticoagulation, family history of spontaneous SAH, concomitant drug use, toxic exposure.   Patient has a normal complete neurological exam, normal vital signs, normal level of consciousness, no signs of meningismus, is well-appearing/non-toxic appearing, no signs  of trauma.   Imaging with CT/MRI not indicated given history and physical exam findings.   No dangerous or life-threatening conditions suspected or identified by history, physical exam, and by work-up. No indications for hospitalization  identified.     ED Discharge Orders        Ordered    diazepam (VALIUM) 5 MG tablet  Every 8 hours PRN     12/19/17 1453       Carlisle Cater, PA-C 12/19/17 1457    Little, Wenda Overland, MD 12/20/17 [phone]

## 2017-12-19 NOTE — ED Notes (Signed)
Up to BR , declined w/c, assisted by Husband

## 2017-12-19 NOTE — ED Notes (Signed)
Pt ambulated to BR without need for assist. Family with pt

## 2017-12-21 MED FILL — diazePAM 5 MG TABS: 5 | 3 days supply | Qty: 8 | Fill #0

## 2017-12-22 ENCOUNTER — Ambulatory Visit (INDEPENDENT_AMBULATORY_CARE_PROVIDER_SITE_OTHER): Payer: 59 | Admitting: Family Medicine

## 2017-12-22 ENCOUNTER — Other Ambulatory Visit: Payer: Self-pay

## 2017-12-22 ENCOUNTER — Encounter: Payer: Self-pay | Admitting: Family Medicine

## 2017-12-22 VITALS — BP 126/83 | HR 72 | Temp 98.1°F | Resp 16 | Ht 62.0 in | Wt 139.5 lb

## 2017-12-22 DIAGNOSIS — R51 Headache: Secondary | ICD-10-CM

## 2017-12-22 DIAGNOSIS — M542 Cervicalgia: Secondary | ICD-10-CM

## 2017-12-22 DIAGNOSIS — E876 Hypokalemia: Secondary | ICD-10-CM | POA: Diagnosis not present

## 2017-12-22 DIAGNOSIS — R519 Headache, unspecified: Secondary | ICD-10-CM

## 2017-12-22 LAB — BASIC METABOLIC PANEL
BUN: 9 mg/dL (ref 6–23)
CO2: 30 mEq/L (ref 19–32)
Calcium: 9.6 mg/dL (ref 8.4–10.5)
Chloride: 100 mEq/L (ref 96–112)
Creatinine, Ser: 0.72 mg/dL (ref 0.40–1.20)
GFR: 89.72 mL/min (ref >60.00)
Glucose, Bld: 84 mg/dL (ref 70–99)
Potassium: 3.8 mEq/L (ref 3.5–5.1)
Sodium: 137 mEq/L (ref 135–145)

## 2017-12-22 MED ORDER — CYCLOBENZAPRINE HCL 10 MG PO TABS: 10.0000 mg | ORAL_TABLET | Freq: Three times a day (TID) | ORAL | 0 refills | Status: DC | PRN

## 2017-12-22 MED ORDER — PREDNISONE 10 MG PO TABS: ORAL_TABLET | ORAL | 0 refills | Status: DC

## 2017-12-22 MED FILL — predniSONE 10 MG TABS: 10 | 9 days supply | Qty: 18 | Fill #0

## 2017-12-22 MED FILL — CYCLOBENZAPRINE HCL 10 MG T: 10 | 10 days supply | Qty: 30 | Fill #0

## 2017-12-22 NOTE — Progress Notes (Signed)
   Subjective:    Patient ID: Nicole Crosby, female    DOB: Apr 29, 1966, 52 y.o.   MRN: [id number]  HPI Head and neck pain- pt was seen in ER 2/17 for vertigo.  Vertigo has resolved.  Pt now complains of pain around R eye, extending towards ear, down neck.  Pt reports pain w/ opening mouth.  No relief w/ tylenol or ibuprofen.  Some relief w/ heat on neck.  Pain will last 'a few minutes'.  Worse w/ motion.  Hypokalemia- pt's K+ was 2.8 in ER but this was after 2 days of vomiting and diarrhea.   Review of Systems For ROS see HPI     Objective:   Physical Exam  Constitutional: She is oriented to person, place, and time. She appears well-developed and well-nourished.  uncomfortable  HENT:  Head: Normocephalic and atraumatic.  Mouth/Throat: Oropharynx is clear and moist.  TMs WNL bilaterally  Neck: Normal range of motion. Neck supple.  TTP over R trap spasm  Neurological: She is alert and oriented to person, place, and time. No cranial nerve deficit.  Vitals reviewed.         Assessment & Plan:  Neck pain- new.  Pt w/ tight trap spasm on PE.  No relief w/ NSAIDs.  Will start pred taper and flexeril.  If no improvement will need ortho or sports med referral.  Pt expressed understanding and is in agreement w/ plan.   Facial pain- no pain w/ palpation on exam today.  No sinus TTP or evidence of inflammation or congestion.  Prednisone taper for neck will hopefully also improve facial pain.  Will follow.  Hypokalemia- new.  K+ was 2.8 in ER on Sunday.  Repeat BMP today and see if levels have normalized now that she is no longer vomiting or having diarrhea.

## 2017-12-22 NOTE — Patient Instructions (Signed)
Follow up by phone in 7-10 days and let me know how you're doing We'll notify you of your lab results and make any changes if needed START the Prednisone as directed- take w/ food Use Tylenol (Acetaminophen) as needed for breakthrough pain Continue the Flexeril at night for neck spasm (i'll send in a new prescription) Call with any questions or concerns Hang in there!!

## 2018-01-03 MED FILL — POTASSIUM CL ER 20 MEQ TABL: 20 | 30 days supply | Qty: 60 | Fill #1

## 2018-01-19 ENCOUNTER — Other Ambulatory Visit: Payer: Self-pay | Admitting: Family Medicine

## 2018-01-20 MED FILL — NUVARING VAGINAL RING: 0.12-0.015 | 84 days supply | Qty: 3 | Fill #0

## 2018-01-24 ENCOUNTER — Other Ambulatory Visit: Payer: Self-pay | Admitting: Family Medicine

## 2018-01-24 MED FILL — LOSARTAN POTASSIUM-HCTZ 50-: 50-12.5 | 90 days supply | Qty: 90 | Fill #0

## 2018-02-07 MED FILL — POTASSIUM CL ER 20 MEQ TAB: 20 | 30 days supply | Qty: 60 | Fill #2

## 2018-02-14 NOTE — Progress Notes (Signed)
[facility] [address] [place], [place] [address] Phone: [phone] Subjective:      CC: Elbow pain  [id number]  Nicole Crosby is a 52 y.o. female coming in with complaint of right elbow pain. Pain is felt on the medial epicondyle. Pain is present with movement and sensitive to pressure in that area. Pain character is achy. Denies any radiating symptoms. Elbow flexion and wrist extension cause pain in that area. Has tried ice, heat, aspercream, Mobic, prednisone, pennsaid. None of those therapies have helped.       Past Medical History:  Diagnosis Date  . Allergy    Buspirone, Sulfa, Pantoprazole  . Benign fundic gland polyps of stomach 02/12/2016  . Chicken pox   . DYSTHYMIA 09/30/2009  . Functional dyspepsia   . GERD (gastroesophageal reflux disease)   . H/O adenomatous polyp of colon 10/04/2014   09/2014 - 3 mm adenoma - repeat colonoscopy 2022  . Hyperlipidemia    no meds  . Hypertension    pt denies  . IBS (irritable bowel syndrome)   . Insomnia   . MEDIAL EPICONDYLITIS, RIGHT 07/17/2010  . 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 Other abnormal glucose    Tiny hypodensity left hepatic lobe-CT of abdomen and pelvis 2/42/3536-RWERX 1mm periumblical hernia  . PAC (premature atrial contraction)   . Palpitations    Past Surgical History:  Procedure Laterality Date  . CESAREAN SECTION  01/2001  . COLONOSCOPY    . DE QUERVAIN'S RELEASE  2000   Right wrist area   Social History   Socioeconomic History  . Marital status: Married    Spouse name: Not on file  . Number of children: 2  . Years of education: Not on file  . Highest education level: Not on file  Occupational History  . Occupation: Xray/CT Engineer, production: [employer]  . Financial resource strain: Not on file  . Food insecurity:    Worry: Not on file    Inability: Not on file  . Transportation needs:    Medical: Not on file    Non-medical: Not on file    Tobacco Use  . Smoking status: Never Smoker  . Smokeless tobacco: Never Used  Substance and Sexual Activity  . Alcohol use: Yes    Alcohol/week: 0.6 oz    Types: 1 Glasses of wine per week    Comment: occasional  . Drug use: No  . Sexual activity: Yes    Birth control/protection: Inserts  Lifestyle  . Physical activity:    Days per week: Not on file    Minutes per session: Not on file  . Stress: Not on file  Relationships  . Social connections:    Talks on phone: Not on file    Gets together: Not on file    Attends religious service: Not on file    Active member of club or organization: Not on file    Attends meetings of clubs or organizations: Not on file    Relationship status: Not on file  Other Topics Concern  . 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    Alcohol Use - yes-wine         Full Time    Married           Allergies  Allergen Reactions  . Buspirone Other (See Comments)  nightmare  . Esomeprazole Other (See Comments)    headache  . Lansoprazole     Abdominal pain  . Omeprazole     Headache  . Sulfonamide Derivatives Rash     Rash   Family History  Problem Relation Age of Onset  . Hypertension Mother   . Hyperlipidemia Mother   . Fibrocystic breast disease Mother   . Hearing loss Mother   . Osteopenia Mother   . Prostate cancer Father 55       deceased  . Hypertension Father   . Diabetes Father   . Kidney disease Father   . Heart disease Father        MI  . Hyperlipidemia Father   . Other Brother        unknown  . Thyroid disease Sister   . Diabetes Sister        pre-diabetes  . Hypertension Sister   . Arthritis Sister   . Diabetes Sister   . Hypertension Sister   . Hyperlipidemia Sister   . Fibrocystic breast disease Sister   . Colon cancer Maternal Uncle        died in late 23's  . Diabetes Maternal Grandmother   . Prostate cancer Maternal Grandfather   . COPD  Maternal Grandfather   . Hyperlipidemia Sister   . Hypertension Sister   . Rectal cancer Neg Hx   . Stomach cancer Neg Hx   . Esophageal cancer Neg Hx   . Pancreatic cancer Neg Hx      Past medical history, social, surgical and family history all reviewed in electronic medical record.  No pertanent information unless stated regarding to the chief complaint.   Review of Systems:Review of systems updated and as accurate as of 02/14/18  No headache, visual changes, nausea, vomiting, diarrhea, constipation, dizziness, abdominal pain, skin rash, fevers, chills, night sweats, weight loss, swollen lymph nodes, body aches, joint swelling, muscle aches, chest pain, shortness of breath, mood changes.   Objective  There were no vitals taken for this visit. Systems examined below as of 02/14/18   General: No apparent distress alert and oriented x3 mood and affect normal, dressed appropriately.  HEENT: Pupils equal, extraocular movements intact  Respiratory: Patient's speak in full sentences and does not appear short of breath  Cardiovascular: No lower extremity edema, non tender, no erythema  Skin: Warm dry intact with no signs of infection or rash on extremities or on axial skeleton.  Abdomen: Soft nontender  Neuro: Cranial nerves II through XII are intact, neurovascularly intact in all extremities with 2+ DTRs and 2+ pulses.  Lymph: No lymphadenopathy of posterior or anterior cervical chain or axillae bilaterally.  Gait normal with good balance and coordination.  MSK:  Non tender with full range of motion and good stability and symmetric strength and tone of shoulders, wrist, hip, knee and ankles bilaterally.  Elbow: Right Unremarkable to inspection. Range of motion full pronation, supination, flexion, extension. Strength is full to all of the above directions Stable to varus, valgus stress. Negative moving valgus stress test. No discrete areas of tenderness to palpation. Ulnar nerve does  sublux. Positive cubital tunnel Tinel's.  Musculoskeletal ultrasound was performed and interpreted by Charlann Boxer D.O.   Elbow:  Medial epicondylar region seems to be unremarkable with no significant tendon tearing noted.  No avulsion fracture noted.  Patient does have dilatation of the ulnar nerve noted and does not appear to be in the fossa.  Increasing Doppler flow noted.  IMPRESSION: Ulnar nerve subluxation  [address]7110; 15 additional minutes spent for Therapeutic exercises as stated in above notes.  This included exercises focusing on stretching, strengthening, with significant focus on eccentric aspects.   Long term goals include an improvement in range of motion, strength, endurance as well as avoiding reinjury. Patient's frequency would include in 1-2 times a day, 3-5 times a week for a duration of 6-12 weeks.  Elbow anatomy was reviewed, and tendinopathy was explained.  Pt. given a formal rehab program. Series of concentric and eccentric exercises should be done starting with no weight, work up to 1 lb, hammer, etc.  Use counterforce strap if working or using hands.  Formal PT would be beneficial. Emphasized stretching an cross-friction massage Emphasized proper palms up lifting biomechanics to unload ECRB   Proper technique shown and discussed handout in great detail with ATC.  All questions were discussed and answered.     Impression and Recommendations:     This case required medical decision making of moderate complexity.      Note: This dictation was prepared with Dragon dictation along with smaller phrase technology. Any transcriptional errors that result from this process are unintentional.

## 2018-02-15 ENCOUNTER — Ambulatory Visit: Payer: Self-pay

## 2018-02-15 ENCOUNTER — Ambulatory Visit (INDEPENDENT_AMBULATORY_CARE_PROVIDER_SITE_OTHER): Payer: 59 | Admitting: Family Medicine

## 2018-02-15 ENCOUNTER — Encounter: Payer: Self-pay | Admitting: Family Medicine

## 2018-02-15 VITALS — BP 118/78 | HR 104 | Ht 61.5 in | Wt 137.0 lb

## 2018-02-15 DIAGNOSIS — G5621 Lesion of ulnar nerve, right upper limb: Secondary | ICD-10-CM | POA: Diagnosis not present

## 2018-02-15 DIAGNOSIS — M25521 Pain in right elbow: Secondary | ICD-10-CM

## 2018-02-15 MED ORDER — GABAPENTIN 100 MG PO CAPS: 200.0000 mg | ORAL_CAPSULE | Freq: Every day | ORAL | 3 refills | Status: DC

## 2018-02-15 MED FILL — GABAPENTIN 100 MG CAPSULE: 100 | 30 days supply | Qty: 60 | Fill #0

## 2018-02-15 NOTE — Patient Instructions (Signed)
Good to see you  Ice 20 minutes 2 times daily. Usually after activity and before bed. Exercises 3 times a week.   pennsaid pinkie amount topically 2 times daily as needed.   Compression sleeve could help daily for 2 weeks Keep hands within peripheral vision.  Gabapentin 200mg  nightly  See me again in 4 weeks

## 2018-02-15 NOTE — Assessment & Plan Note (Signed)
Patient was given more of the diagnosis of an ulnar nerve compression.  Discussed in compression sleeve, topical anti-inflammatories, gabapentin given.  Home exercises given.  Follow-up again in 4 weeks

## 2018-03-14 MED FILL — POTASSIUM CL ER 20 MEQ TAB: 20 | 30 days supply | Qty: 60 | Fill #3

## 2018-03-16 NOTE — Progress Notes (Signed)
[facility] [address] [place], [place] [address] Phone: [phone] Subjective:     CC: Right elbow pain follow-up  [id number]  Nicole Crosby is a 52 y.o. female coming in with complaint of right elbow pain. She had an increase in her pain since last visit. Her pain is now going up into her tricep. She notes pain with supination.  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  Sometimes seems to cause difficulty with some daily activities.    Past Medical History:  Diagnosis Date  . Allergy    Buspirone, Sulfa, Pantoprazole  . Benign fundic gland polyps of stomach 02/12/2016  . Chicken pox   . DYSTHYMIA 09/30/2009  . Functional dyspepsia   . GERD (gastroesophageal reflux disease)   . H/O adenomatous polyp of colon 10/04/2014   09/2014 - 3 mm adenoma - repeat colonoscopy 2022  . Hyperlipidemia    no meds  . Hypertension    pt denies  . IBS (irritable bowel syndrome)   . Insomnia   . MEDIAL EPICONDYLITIS, RIGHT 07/17/2010  . 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 Other abnormal glucose    Tiny hypodensity left hepatic lobe-CT of abdomen and pelvis 0/99/8338-SNKNL 26mm periumblical hernia  . PAC (premature atrial contraction)   . Palpitations    Past Surgical History:  Procedure Laterality Date  . CESAREAN SECTION  01/2001  . COLONOSCOPY    . DE QUERVAIN'S RELEASE  2000   Right wrist area   Social History   Socioeconomic History  . Marital status: Married    Spouse name: Not on file  . Number of children: 2  . Years of education: Not on file  . Highest education level: Not on file  Occupational History  . Occupation: Xray/CT Engineer, production: [employer]  . Financial resource strain: Not on file  . Food insecurity:    Worry: Not on file    Inability: Not on file  . Transportation needs:    Medical: Not on file    Non-medical: Not on file  Tobacco Use  . Smoking status: Never Smoker  .  Smokeless tobacco: Never Used  Substance and Sexual Activity  . Alcohol use: Yes    Alcohol/week: 0.6 oz    Types: 1 Glasses of wine per week    Comment: occasional  . Drug use: No  . Sexual activity: Yes    Birth control/protection: Inserts  Lifestyle  . Physical activity:    Days per week: Not on file    Minutes per session: Not on file  . Stress: Not on file  Relationships  . Social connections:    Talks on phone: Not on file    Gets together: Not on file    Attends religious service: Not on file    Active member of club or organization: Not on file    Attends meetings of clubs or organizations: Not on file    Relationship status: Not on file  Other Topics Concern  . 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    Alcohol Use - yes-wine         Full Time    Married           Allergies  Allergen Reactions  . Buspirone Other (See Comments)     nightmare  . Esomeprazole Other (  See Comments)    headache  . Lansoprazole     Abdominal pain  . Omeprazole     Headache  . Sulfonamide Derivatives Rash     Rash   Family History  Problem Relation Age of Onset  . Hypertension Mother   . Hyperlipidemia Mother   . Fibrocystic breast disease Mother   . Hearing loss Mother   . Osteopenia Mother   . Prostate cancer Father 59       deceased  . Hypertension Father   . Diabetes Father   . Kidney disease Father   . Heart disease Father        MI  . Hyperlipidemia Father   . Other Brother        unknown  . Thyroid disease Sister   . Diabetes Sister        pre-diabetes  . Hypertension Sister   . Arthritis Sister   . Diabetes Sister   . Hypertension Sister   . Hyperlipidemia Sister   . Fibrocystic breast disease Sister   . Colon cancer Maternal Uncle        died in late 40's  . Diabetes Maternal Grandmother   . Prostate cancer Maternal Grandfather   . COPD Maternal Grandfather   . Hyperlipidemia Sister     . Hypertension Sister   . Rectal cancer Neg Hx   . Stomach cancer Neg Hx   . Esophageal cancer Neg Hx   . Pancreatic cancer Neg Hx      Past medical history, social, surgical and family history all reviewed in electronic medical record.  No pertanent information unless stated regarding to the chief complaint.   Review of Systems:Review of systems updated and as accurate as of 03/17/18  No headache, visual changes, nausea, vomiting, diarrhea, constipation, dizziness, abdominal pain, skin rash, fevers, chills, night sweats, weight loss, swollen lymph nodes, body aches, joint swelling, muscle aches, chest pain, shortness of breath, mood changes.   Objective  Blood pressure 122/80, pulse 82, height 5' 1.5" (1.562 m), weight 136 lb (61.7 kg), SpO2 99 %. Systems examined below as of 03/17/18   General: No apparent distress alert and oriented x3 mood and affect normal, dressed appropriately.  HEENT: Pupils equal, extraocular movements intact  Respiratory: Patient's speak in full sentences and does not appear short of breath  Cardiovascular: No lower extremity edema, non tender, no erythema  Skin: Warm dry intact with no signs of infection or rash on extremities or on axial skeleton.  Abdomen: Soft nontender  Neuro: Cranial nerves II through XII are intact, neurovascularly intact in all extremities with 2+ DTRs and 2+ pulses.  Lymph: No lymphadenopathy of posterior or anterior cervical chain or axillae bilaterally.  Gait normal with good balance and coordination.  MSK:  Non tender with full range of motion and good stability and symmetric strength and tone of shoulders,  wrist, hip, knee and ankles bilaterally.  Elbow: Right Unremarkable to inspection. Range of motion full pronation, supination, flexion, extension. Strength is full to all of the above directions Stable to varus, valgus stress. Negative moving valgus stress test. Moderate to severe tenderness over the medial epicondylar  region.  Less pain over the ulnar nerve itself. Ulnar nerve does not sublux. Negative cubital tunnel Tinel's.  Contralateral elbow unremarkable  Musculoskeletal ultrasound was performed and interpreted by Charlann Boxer D.O.   Elbow:  Right  Lateral epicondyle and common extensor tendon origin visualized.  No edema, effusions, or avulsions seen.  Radial head unremarkable  and located in annular ligament Medial epicondyle and common flexor tendon origin visualized.  Patient does have scar tissue formation which is different from previous exam.  Ulnar nerve seems to be unremarkable. Olecranon and triceps insertion visualized and unremarkable without edema, effusion, or avulsion.  No signs olecranon bursitis. Power doppler signal normal.  IMPRESSION: Possible interval healing of medial epicondylitis Impression and Recommendations:     This case required medical decision making of moderate complexity.      Note: This dictation was prepared with Dragon dictation along with smaller phrase technology. Any transcriptional errors that result from this process are unintentional.

## 2018-03-17 ENCOUNTER — Encounter: Payer: Self-pay | Admitting: Family Medicine

## 2018-03-17 ENCOUNTER — Ambulatory Visit (INDEPENDENT_AMBULATORY_CARE_PROVIDER_SITE_OTHER): Payer: 59 | Admitting: Family Medicine

## 2018-03-17 ENCOUNTER — Ambulatory Visit: Payer: Self-pay

## 2018-03-17 VITALS — BP 122/80 | HR 82 | Ht 61.5 in | Wt 136.0 lb

## 2018-03-17 DIAGNOSIS — G5621 Lesion of ulnar nerve, right upper limb: Secondary | ICD-10-CM | POA: Diagnosis not present

## 2018-03-17 DIAGNOSIS — M25521 Pain in right elbow: Secondary | ICD-10-CM | POA: Diagnosis not present

## 2018-03-17 DIAGNOSIS — M7701 Medial epicondylitis, right elbow: Secondary | ICD-10-CM | POA: Diagnosis not present

## 2018-03-17 NOTE — Assessment & Plan Note (Signed)
Significant decrease in inflammation around the ulnar nerve.  Patient should be making progress.  Discussed gabapentin at night which patient declined to take on a regular basis.  We discussed the topical anti-inflammatories as patient does not want to do either.  Patient will be referred to formal physical therapy to see if this will be beneficial for the medial epicondylitis as well as the ulnar nerve subluxation.  Patient will follow-up with me again in 4 weeks

## 2018-03-17 NOTE — Patient Instructions (Signed)
Good to see you  You are doing much better on ultrasound and have a lot of scar tissue I think physical therapy would be great They will call you  Ice is still a good idea.  Still avoid excessive wrist flexion and a lot of supination  See me again in 4 weeks

## 2018-03-23 ENCOUNTER — Other Ambulatory Visit: Payer: Self-pay

## 2018-03-23 ENCOUNTER — Encounter: Payer: Self-pay | Admitting: Physical Therapy

## 2018-03-23 ENCOUNTER — Ambulatory Visit: Payer: 59 | Attending: Family Medicine | Admitting: Physical Therapy

## 2018-03-23 DIAGNOSIS — M25631 Stiffness of right wrist, not elsewhere classified: Secondary | ICD-10-CM | POA: Diagnosis not present

## 2018-03-23 DIAGNOSIS — M25521 Pain in right elbow: Secondary | ICD-10-CM | POA: Insufficient documentation

## 2018-03-23 DIAGNOSIS — R29898 Other symptoms and signs involving the musculoskeletal system: Secondary | ICD-10-CM | POA: Insufficient documentation

## 2018-03-23 NOTE — Patient Instructions (Addendum)
Access Code: [email or web address]  URL: [email or web address]  Date: 03/23/2018  Prepared by: Grayling Congress   Exercises  Wrist Flexion with Resistance - 10 reps - 2 sets - 2x daily - 7x weekly  Wrist Extension with Resistance - 10 reps - 2 sets - 2x daily - 7x weekly  Isometric Wrist Extension Pronated - 10 reps - 2 sets - 10 hold - 2x daily - 7x weekly  Isometric Wrist Pronation - 10 reps - 2 sets - 10 hold - 2x daily - 7x weekly  Standing Wrist Extension Stretch - 2 reps - 20 sets - 2x daily - 7x weekly  Ulnar Nerve Flossing - 10 reps - 2 sets - 2x daily - 7x weekly

## 2018-03-23 NOTE — Therapy (Signed)
[facility] [address]  [place], [place], [address] Phone: [phone]   Fax:  [phone]  Physical Therapy Evaluation  Patient Details  Name: Nicole Crosby MRN: [id number] Date of Birth: [birth year] Referring Provider: Hulan Saas, DO   Encounter Date: 03/23/2018  PT End of Session - 03/23/18 1728    Visit Number  1    Number of Visits  5    Date for PT Re-Evaluation  04/20/18    Authorization Type  [facility]    PT Start Time  9678    PT Stop Time  1617    PT Time Calculation (min)  47 min    Activity Tolerance  Patient tolerated treatment well    Behavior During Therapy  [facility] for tasks assessed/performed       Past Medical History:  Diagnosis Date  . Allergy    Buspirone, Sulfa, Pantoprazole  . Benign fundic gland polyps of stomach 02/12/2016  . Chicken pox   . DYSTHYMIA 09/30/2009  . Functional dyspepsia   . GERD (gastroesophageal reflux disease)   . H/O adenomatous polyp of colon 10/04/2014   09/2014 - 3 mm adenoma - repeat colonoscopy 2022  . Hyperlipidemia    no meds  . Hypertension    pt denies  . IBS (irritable bowel syndrome)   . Insomnia   . MEDIAL EPICONDYLITIS, RIGHT 07/17/2010  . 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 Other abnormal glucose    Tiny hypodensity left hepatic lobe-CT of abdomen and pelvis 9/38/1017-[facility] 79mm periumblical hernia  . PAC (premature atrial contraction)   . Palpitations     Past Surgical History:  Procedure Laterality Date  . CESAREAN SECTION  01/2001  . COLONOSCOPY    . DE QUERVAIN'S RELEASE  2000   Right wrist area    There were no vitals filed for this visit.   Subjective Assessment - 03/23/18 1539    Subjective  Patient reports pain in R elbow started in February. Can't recall an event that caused onset. 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 Pain is intermittent and not bothering her unless she is  using it. Pain at best: 0/10., pain at worst: 6/10. Denies N/T. Had follow up appointment with MD who said it seems to be healing well- she agrees that is has started to feel better.    Limitations  House hold activities    Diagnostic tests  04/22 [place] of R elbow: ulnar nerve subluxation; 05/16: possible interval healing of medial epicondylitis     Patient Stated Goals  get over it quickly, flex elbow and externally rotate elbow without pain    Currently in Pain?  No/denies    Pain Score  0-No pain    Pain Location  Elbow    Pain Orientation  Right;Medial    Pain Descriptors / Indicators  Aching    Pain Type  Chronic pain    Pain Onset  More than a month ago    Aggravating Factors   supination, washing hands, vacuuming     Pain Relieving Factors  ice         [facility] PT Assessment - 03/23/18 1546      Assessment   Medical Diagnosis  Medial Epicondylitis of R elbow    Referring Provider  Hulan Saas, DO    Onset Date/Surgical Date  11/23/17    Hand Dominance  Right    Next MD Visit  04/14/18    Prior Therapy  No      Precautions   Precautions  None      Restrictions   Weight Bearing Restrictions  No      Balance Screen   Has the patient fallen in the past 6 months  No    Has the patient had a decrease in activity level because of a fear of falling?   No    Is the patient reluctant to leave their home because of a fear of falling?   No      Home Film/video editor residence    Living Arrangements  Spouse/significant other;Children    Available Help at Discharge  Family    Type of [place] to enter    Entrance Stairs-Number of Steps  3    [place]  One level      Prior Function   Level of Independence  Independent    Vocation  Full time employment Works at [employer]  chores at home      Cognition   Overall Cognitive Status  Within Functional Limits for tasks assessed       Observation/Other Assessments   Focus on Therapeutic Outcomes (FOTO)   Elbow: 69 (31% limited, 24% predicted)      Sensation   Light Touch  Appears Intact      Coordination   Gross Motor Movements are Fluid and Coordinated  Yes    Fine Motor Movements are Fluid and Coordinated  Yes      Posture/Postural Control   Posture/Postural Control  Postural limitations    Postural Limitations  Rounded Shoulders;Forward head      ROM / Strength   AROM / PROM / Strength  AROM;PROM;Strength      AROM   AROM Assessment Site  Shoulder;Elbow;Wrist    Right/Left Shoulder  -- B grossly WNL    Right/Left Elbow  Right;Left    Right Elbow Flexion  139 R pronation 80, supination 88    Right Elbow Extension  4    Left Elbow Flexion  145 L pronation 80, supination 90    Left Elbow Extension  5    Right/Left Wrist  Left    Right Wrist Extension  60 Degrees pain in R elbow    Right Wrist Flexion  75 Degrees    Left Wrist Extension  70 Degrees    Left Wrist Flexion  70 Degrees      Strength   Strength Assessment Site  Elbow;Wrist    Right/Left Elbow  Right;Left    Right Elbow Flexion  4+/5 R pronation & supination: 4/5    Right Elbow Extension  4+/5    Left Elbow Flexion  4+/5    Left Elbow Extension  4+/5    Right/Left Wrist  Right;Left    Right Wrist Flexion  4/5    Right Wrist Extension  4/5 pain in R elbow    Left Wrist Flexion  4+/5 L pronation/supination: 4+/5    Left Wrist Extension  4+/5      Palpation   Palpation comment  moderate soft tissue restriction in flexor distrbution; medial epicondyle and pronator teres TTP      Special Tests   Other special tests  Tinel's at cubital tunnel- positive for pain on R  Objective measurements completed on examination: See above findings.              PT Education - 03/23/18 1727    Education provided  Yes    Education Details  prognosis, POC, HEP    Person(s) Educated  Patient    Methods   Explanation;Demonstration;Tactile cues;Verbal cues;Handout    Comprehension  Verbalized understanding       PT Short Term Goals - 03/23/18 1742      PT SHORT TERM GOAL #1   Title  Patient to be independent with initial HEP.    Time  2    Period  Weeks    Status  New    Target Date  04/06/18        PT Long Term Goals - 03/23/18 1743      PT LONG TERM GOAL #1   Title  Patient to be independent with advanced HEP.    Time  4    Period  Weeks    Status  New    Target Date  04/20/18      PT LONG TERM GOAL #2   Title  Patient to demonstrate >=4+/5 strength in R wrist and elbow.    Time  4    Period  Weeks    Status  New    Target Date  04/20/18      PT LONG TERM GOAL #3   Title  Patient to demonstrate WNL AROM in R wrist and elbow without pain.    Time  4    Period  Weeks    Status  New    Target Date  04/20/18      PT LONG TERM GOAL #4   Title  Patient to report <=1/10 pain in R elbow during entire work shift.     Time  4    Period  Weeks    Status  New    Target Date  04/20/18             Plan - 03/23/18 1729    Clinical Impression Statement  Patient is a 52y/o F presenting to OPPT with report of R medial elbow pain. Aggravating factors include supinating elbow, washing hands, vacuuming. Using ice currently to ease pain. Pain is intermittent and not bothering her unless she is using it, and has been getting better since onset. Patient with pain with resisted wrist flexion; medial epicondyle and flexor group TTP with significant soft tissue restriction in R flexor compartment. Presents with the following impairments: decreased ROM, decreased strength, decreased flexibility, pain. Would benefit from skilled PT services 1x/ week for 4 weeks to address aforementioned impairments. Educated patient on using ice massage at medial epicondyle for pain relief; patient reported understanding. Patient educated on and received HEP handout- good carryover.     Clinical  Presentation  Stable    Clinical Decision Making  Low    Rehab Potential  Good    PT Frequency  1x / week    PT Duration  4 weeks    PT Treatment/Interventions  ADLs/Self Care Home Management;Cryotherapy;Electrical Stimulation;Iontophoresis 4mg /ml Dexamethasone;Moist Heat;Ultrasound;Functional mobility training;Therapeutic activities;Therapeutic exercise;Manual techniques;Patient/family education;Passive range of motion;Dry needling;Energy conservation;Splinting;Taping;Vasopneumatic Device    PT Next Visit Plan  STM to flexor compartment, reassess HEP    Consulted and Agree with Plan of Care  Patient       Patient will benefit from skilled therapeutic intervention in order to improve the following deficits and impairments:  Decreased activity tolerance, Decreased  strength, Impaired UE functional use, Pain, Decreased range of motion, Postural dysfunction  Visit Diagnosis: Pain in right elbow  Stiffness of right wrist, not elsewhere classified  Other symptoms and signs involving the musculoskeletal system     Problem List Patient Active Problem List   Diagnosis Date Noted  . Medial epicondylitis, right 03/17/2018  . 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 Vitamin D deficiency 11/03/2017  . Shortened PR interval 09/27/2017  . Greater trochanteric bursitis of left hip 05/12/2017  . B12 deficiency 01/22/2017  . Memory changes 08/28/2016  . 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 Benign fundic gland polyps of stomach 02/12/2016  . Paresthesia 12/17/2015  . Bladder spasm 12/13/2014  . H/O adenomatous polyp of colon 10/04/2014  . Fatigue 09/03/2014  . Chest pain 03/14/2014  . Decreased hearing of right ear 02/05/2014  . Other malaise and fatigue 01/11/2014  . Maxillary sinusitis 10/13/2013  . Screening for malignant neoplasm of cervix 05/31/2013  . Physical exam 05/31/2013  . Pelvic pain in female 05/31/2013  . Left L3 lumbar radiculitis  09/23/2012  . Neck pain 08/22/2012  . Gait disturbance 08/16/2012  . Adrenal nodule ([place]) 03/02/2012  . Essential hypertension, benign 03/02/2012  . IBS (irritable bowel syndrome)   . PAC (premature atrial contraction)   . Insomnia   . GERD (gastroesophageal reflux disease)   . DYSTHYMIA 09/30/2009  . HEADACHE 09/21/2008  . HEPATIC CYST 08/14/2008  . Hyperlipidemia 08/06/2008    Janene Harvey, PT, DPT 03/23/18 5:50 PM   [facility] [address]  [place], [place], [address] Phone: [phone]   Fax:  [phone]  Name: JOANA NOLTON MRN: [id number] Date of Birth: 1965-12-05

## 2018-04-06 ENCOUNTER — Ambulatory Visit: Payer: 59 | Attending: Family Medicine | Admitting: Physical Therapy

## 2018-04-06 ENCOUNTER — Encounter: Payer: Self-pay | Admitting: Physical Therapy

## 2018-04-06 DIAGNOSIS — M25521 Pain in right elbow: Secondary | ICD-10-CM | POA: Insufficient documentation

## 2018-04-06 DIAGNOSIS — R29898 Other symptoms and signs involving the musculoskeletal system: Secondary | ICD-10-CM | POA: Insufficient documentation

## 2018-04-06 DIAGNOSIS — M25631 Stiffness of right wrist, not elsewhere classified: Secondary | ICD-10-CM | POA: Insufficient documentation

## 2018-04-06 NOTE — Therapy (Signed)
[facility] [address]  [place], [place], [address] Phone: [phone]   Fax:  [phone]  Physical Therapy Treatment  Patient Details  Name: Nicole Crosby MRN: [id number] Date of Birth: 12/06/1965 Referring Provider: Hulan Saas, DO   Encounter Date: 04/06/2018  PT End of Session - 04/06/18 1722    Visit Number  2    Number of Visits  5    Date for PT Re-Evaluation  04/20/18    Authorization Type  [facility]    PT Start Time  1615    PT Stop Time  1703    PT Time Calculation (min)  48 min    Activity Tolerance  Patient tolerated treatment well    Behavior During Therapy  [facility] for tasks assessed/performed       Past Medical History:  Diagnosis Date  . Allergy    Buspirone, Sulfa, Pantoprazole  . Benign fundic gland polyps of stomach 02/12/2016  . Chicken pox   . DYSTHYMIA 09/30/2009  . Functional dyspepsia   . GERD (gastroesophageal reflux disease)   . H/O adenomatous polyp of colon 10/04/2014   09/2014 - 3 mm adenoma - repeat colonoscopy 2022  . Hyperlipidemia    no meds  . Hypertension    pt denies  . IBS (irritable bowel syndrome)   . Insomnia   . MEDIAL EPICONDYLITIS, RIGHT 07/17/2010  . 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 Other abnormal glucose    Tiny hypodensity left hepatic lobe-CT of abdomen and pelvis 0/08/9322-[facility] 79mm periumblical hernia  . PAC (premature atrial contraction)   . Palpitations     Past Surgical History:  Procedure Laterality Date  . CESAREAN SECTION  01/2001  . COLONOSCOPY    . DE QUERVAIN'S RELEASE  2000   Right wrist area    There were no vitals filed for this visit.  Subjective Assessment - 04/06/18 1616    Subjective  Reports only able to perform exercises 1x/day. Got an elbow brace but not helping. Reports she is not any better and has had some really hard days.    Diagnostic tests  04/22 [place] of R elbow: ulnar nerve subluxation; 05/16: possible interval healing  of medial epicondylitis     Patient Stated Goals  get over it quickly, flex elbow and externally rotate elbow without pain    Currently in Pain?  Yes    Pain Score  3     Pain Location  Elbow    Pain Orientation  Right;Medial    Pain Descriptors / Indicators  Aching                       [facility] Adult PT Treatment/Exercise - 04/06/18 0001      Exercises   Exercises  Elbow;Wrist;Shoulder      Elbow Exercises   Forearm Pronation  Strengthening;Right;10 reps;Seated 1# weight    Other elbow exercises  Ulnar nerve glide; 10x      Shoulder Exercises: ROM/Strengthening   UBE (Upper Arm Bike)  L2 3/3      Wrist Exercises   Wrist Flexion  Strengthening;10 reps;Seated 1#; report of R tricep and lateral epicondyle pain    Other wrist exercises  wrist flexor stretch; leaning over treatment table; 2x20 B UEs      Manual Therapy   Manual Therapy  Soft tissue mobilization;Taping    Manual therapy comments  cross friction massage to R medial epicondyle  Soft tissue mobilization  R wrist flexors, medial tricep- TTP and soft tissue restriction in these areas    [employer];Inhibit Muscle 3 strip kinesiotaping for R medial epicondylitis             PT Education - 04/06/18 1721    Education provided  Yes    Education Details  Kinesiotaping length of wear and removal instructions, use of counterforce brace for pain relief    Person(s) Educated  Patient    Methods  Explanation;Demonstration;Tactile cues;Verbal cues    Comprehension  Verbalized understanding       PT Short Term Goals - 04/06/18 1729      PT SHORT TERM GOAL #1   Title  Patient to be independent with initial HEP.    Time  2    Period  Weeks    Status  Achieved        PT Long Term Goals - 03/23/18 1743      PT LONG TERM GOAL #1   Title  Patient to be independent with advanced HEP.    Time  4    Period  Weeks    Status  New    Target Date  04/20/18      PT LONG TERM GOAL #2    Title  Patient to demonstrate >=4+/5 strength in R wrist and elbow.    Time  4    Period  Weeks    Status  New    Target Date  04/20/18      PT LONG TERM GOAL #3   Title  Patient to demonstrate WNL AROM in R wrist and elbow without pain.    Time  4    Period  Weeks    Status  New    Target Date  04/20/18      PT LONG TERM GOAL #4   Title  Patient to report <=1/10 pain in R elbow during entire work shift.     Time  4    Period  Weeks    Status  New    Target Date  04/20/18            Plan - 04/06/18 1722    Clinical Impression Statement  Patient presented to clinic with report that R elbow pain is about the same- has been consistent with HEP but only able to perform 1x/day. Using ice pack but not ice massage, and bought an elbow sleeve and that hasn't helped either. Tolerated cross friction massage to R medial epicondyle and flexor tendons as well as STM to R wrist flexors and medial tricep. TTP in these areas. Patient performed progressive concentric/eccentric wrist flexor strengthening and stretching with intermittent tricep and new lateral epicondyle pain, but medial epicondyle pain-free. Advised patient to try ice massage at home, try wrist flexor stretches throughout the day at work, and use of counter force brace for symptom management- patient agreeable. Considering DN next treatment session. Received kinesiotape to R elbow at end of session- patient heavily educated not to wear to tolerance but not more than 48 hours, to wash off with soap and water, and to remove if any irritation occurs. Patient agreeable.     PT Treatment/Interventions  ADLs/Self Care Home Management;Cryotherapy;Electrical Stimulation;Iontophoresis 4mg /ml Dexamethasone;Moist Heat;Ultrasound;Functional mobility training;Therapeutic activities;Therapeutic exercise;Manual techniques;Patient/family education;Passive range of motion;Dry needling;Energy conservation;Splinting;Taping;Vasopneumatic Device    PT Next  Visit Plan  DN, continue STM, progressive wrist flexor strengthening/stretching     Consulted and Agree with Plan of Care  Patient       Patient will benefit from skilled therapeutic intervention in order to improve the following deficits and impairments:  Decreased activity tolerance, Decreased strength, Impaired UE functional use, Pain, Decreased range of motion, Postural dysfunction  Visit Diagnosis: Pain in right elbow  Stiffness of right wrist, not elsewhere classified  Other symptoms and signs involving the musculoskeletal system     Problem List Patient Active Problem List   Diagnosis Date Noted  . Medial epicondylitis, right 03/17/2018  . 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 Vitamin D deficiency 11/03/2017  . Shortened PR interval 09/27/2017  . Greater trochanteric bursitis of left hip 05/12/2017  . B12 deficiency 01/22/2017  . Memory changes 08/28/2016  . 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 Benign fundic gland polyps of stomach 02/12/2016  . Paresthesia 12/17/2015  . Bladder spasm 12/13/2014  . H/O adenomatous polyp of colon 10/04/2014  . Fatigue 09/03/2014  . Chest pain 03/14/2014  . Decreased hearing of right ear 02/05/2014  . Other malaise and fatigue 01/11/2014  . Maxillary sinusitis 10/13/2013  . Screening for malignant neoplasm of cervix 05/31/2013  . Physical exam 05/31/2013  . Pelvic pain in female 05/31/2013  . Left L3 lumbar radiculitis 09/23/2012  . Neck pain 08/22/2012  . Gait disturbance 08/16/2012  . Adrenal nodule ([place]) 03/02/2012  . Essential hypertension, benign 03/02/2012  . IBS (irritable bowel syndrome)   . PAC (premature atrial contraction)   . Insomnia   . GERD (gastroesophageal reflux disease)   . DYSTHYMIA 09/30/2009  . HEADACHE 09/21/2008  . HEPATIC CYST 08/14/2008  . Hyperlipidemia 08/06/2008    Janene Harvey, PT, DPT 04/06/18 5:31 PM  [facility] [address]  [place], [place], [address] Phone: [phone]   Fax:  [phone]  Name: Nicole Crosby MRN: [id number] Date of Birth: August 22, 1966

## 2018-04-13 ENCOUNTER — Ambulatory Visit: Payer: 59 | Admitting: Physical Therapy

## 2018-04-13 ENCOUNTER — Encounter: Payer: Self-pay | Admitting: Physical Therapy

## 2018-04-13 DIAGNOSIS — M25631 Stiffness of right wrist, not elsewhere classified: Secondary | ICD-10-CM | POA: Diagnosis not present

## 2018-04-13 DIAGNOSIS — R29898 Other symptoms and signs involving the musculoskeletal system: Secondary | ICD-10-CM

## 2018-04-13 DIAGNOSIS — M25521 Pain in right elbow: Secondary | ICD-10-CM | POA: Diagnosis not present

## 2018-04-13 NOTE — Therapy (Signed)
[facility] [address]  [place], [place], [address] Phone: [phone]   Fax:  [phone]  Physical Therapy Treatment  Patient Details  Name: Nicole Crosby MRN: [id number] Date of Birth: June 14, 1966 Referring Provider: Hulan Saas, DO   Encounter Date: 04/13/2018  PT End of Session - 04/13/18 1614    Visit Number  3    Number of Visits  5    Date for PT Re-Evaluation  04/20/18    Authorization Type  [facility]    PT Start Time  1529    PT Stop Time  1606    PT Time Calculation (min)  37 min    Activity Tolerance  Patient tolerated treatment well    Behavior During Therapy  [facility] for tasks assessed/performed       Past Medical History:  Diagnosis Date  . Allergy    Buspirone, Sulfa, Pantoprazole  . Benign fundic gland polyps of stomach 02/12/2016  . Chicken pox   . DYSTHYMIA 09/30/2009  . Functional dyspepsia   . GERD (gastroesophageal reflux disease)   . H/O adenomatous polyp of colon 10/04/2014   09/2014 - 3 mm adenoma - repeat colonoscopy 2022  . Hyperlipidemia    no meds  . Hypertension    pt denies  . IBS (irritable bowel syndrome)   . Insomnia   . MEDIAL EPICONDYLITIS, RIGHT 07/17/2010  . 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 Other abnormal glucose    Tiny hypodensity left hepatic lobe-CT of abdomen and pelvis 3/76/2831-DVVOH 53m periumblical hernia  . PAC (premature atrial contraction)   . Palpitations     Past Surgical History:  Procedure Laterality Date  . CESAREAN SECTION  01/2001  . COLONOSCOPY    . DE QUERVAIN'S RELEASE  2000   Right wrist area    There were no vitals filed for this visit.  Subjective Assessment - 04/13/18 1530    Subjective  Patient reports she thinks R elbow is getting a little better. Not sure if her MD wants her to continue with PT after she sees MD tomorrow. Reports compliance with HEP.    Diagnostic tests  04/22 [place]: ulnar nerve subluxation; 05/16:  possible interval healing of medial epicondylitis     Patient Stated Goals  get over it quickly, flex elbow and externally rotate elbow without pain    Currently in Pain?  Yes    Pain Score  2     Pain Orientation  Right    Pain Descriptors / Indicators  Aching;Sore         [facility] PT Assessment - 04/13/18 0001      Observation/Other Assessments   Focus on Therapeutic Outcomes (FOTO)   Elbow: 69 (31% limited)      AROM   Right Elbow Flexion  144 pronation 80,supination 80 no pain    Right Elbow Extension  10    Right Wrist Extension  60 Degrees    Right Wrist Flexion  75 Degrees      Strength   Right Wrist Flexion  4/5 mild pain in wrist flexors    Right Wrist Extension  4/5 mild pain in lateral epicondyle                   [facility] Adult PT Treatment/Exercise - 04/13/18 0001      Elbow Exercises   Forearm Pronation  Strengthening;Right;10 reps;Seated      Shoulder Exercises: ROM/Strengthening   UBE (Upper  Arm Bike)  L2 3/3      Shoulder Exercises: Stretch   Other Shoulder Stretches  Wrist flexor stretch over treatment tabler; B UEs 2x30 sec      Wrist Exercises   Wrist Flexion  Strengthening;10 reps;Seated    Wrist Extension  Strengthening;Right;10 reps;Seated 1#      Modalities   Modalities  Cryotherapy      Cryotherapy   Number Minutes Cryotherapy  --    Cryotherapy Location  --    Type of Cryotherapy  -- to tolerance      Manual Therapy   Manual Therapy  Soft tissue mobilization;Other (comment)    Manual therapy comments  cross friction massage to R medial epicondyle     Soft tissue mobilization  R wrist flexors, medial tricep- TTP and soft tissue restriction in these areas    Other Manual Therapy  R medial epicondyle ice massage to tolerace; 7 min             PT Education - 04/13/18 1613    Education provided  Yes    Education Details  use of ice massage for pain management, addition of weight to progress exercises, use of counterforce  brace to pain management    Person(s) Educated  Patient    Methods  Explanation;Demonstration    Comprehension  Verbalized understanding;Returned demonstration       PT Short Term Goals - 04/06/18 1729      PT SHORT TERM GOAL #1   Title  Patient to be independent with initial HEP.    Time  2    Period  Weeks    Status  Achieved        PT Long Term Goals - 04/13/18 1828      PT LONG TERM GOAL #1   Title  Patient to be independent with advanced HEP.    Time  4    Period  Weeks    Status  Achieved advised patient to continue using weights and TB at home to progress exercises to tolerance      PT LONG TERM GOAL #2   Title  Patient to demonstrate >=4+/5 strength in R wrist and elbow.    Time  4    Period  Weeks    Status  Not Met      PT LONG TERM GOAL #3   Title  Patient to demonstrate WNL AROM in R wrist and elbow without pain.    Time  4    Period  Weeks    Status  Achieved      PT LONG TERM GOAL #4   Title  Patient to report <=1/10 pain in R elbow during entire work shift.     Time  4    Period  Weeks    Status  Achieved            Plan - 04/13/18 1836    Clinical Impression Statement  Patient arrived to session with report that she has noticed some improvement in R elbow pain, noting that it barely bothered her today at work. Reports compliance with HEP and report that exercises are causing her less pain than before. Reports that she would like to wrap up with PT because she would like to be done before she sees her MD tomorrow. Updated goals this date- patient with improvements in elbow AROM- noting no pain with elbow or wrist AROM at this time. Still limited in R wrist strength. Patient tolerated cross friction  massage to R medial epicondyle and STM to wrist flexors with mild c/o pain but tolerable. Patient able to perform wrist flexion/extension and elbow pronation/supination with 1# and no c/o pain. Performed wrist flexor stretch with VCs to correct form.  Patient advised to continue HEP and progress as tolerated with resistance bands and small weights; reported understanding. Patient to be placed on 30 day hold per patient's request and d/t patient's independence with HEP at home.     PT Treatment/Interventions  ADLs/Self Care Home Management;Cryotherapy;Electrical Stimulation;Iontophoresis 41m/ml Dexamethasone;Moist Heat;Ultrasound;Functional mobility training;Therapeutic activities;Therapeutic exercise;Manual techniques;Patient/family education;Passive range of motion;Dry needling;Energy conservation;Splinting;Taping;Vasopneumatic Device    PT Next Visit Plan  30 day hold at this time    Consulted and Agree with Plan of Care  Patient       Patient will benefit from skilled therapeutic intervention in order to improve the following deficits and impairments:  Decreased activity tolerance, Decreased strength, Impaired UE functional use, Pain, Decreased range of motion, Postural dysfunction  Visit Diagnosis: Pain in right elbow  Stiffness of right wrist, not elsewhere classified  Other symptoms and signs involving the musculoskeletal system     Problem List Patient Active Problem List   Diagnosis Date Noted  . Medial epicondylitis, right 03/17/2018  . 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 Vitamin D deficiency 11/03/2017  . Shortened PR interval 09/27/2017  . Greater trochanteric bursitis of left hip 05/12/2017  . B12 deficiency 01/22/2017  . Memory changes 08/28/2016  . 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 Benign fundic gland polyps of stomach 02/12/2016  . Paresthesia 12/17/2015  . Bladder spasm 12/13/2014  . H/O adenomatous polyp of colon 10/04/2014  . Fatigue 09/03/2014  . Chest pain 03/14/2014  . Decreased hearing of right ear 02/05/2014  . Other malaise and fatigue 01/11/2014  . Maxillary sinusitis 10/13/2013  . Screening for malignant neoplasm of cervix 05/31/2013  . Physical  exam 05/31/2013  . Pelvic pain in female 05/31/2013  . Left L3 lumbar radiculitis 09/23/2012  . Neck pain 08/22/2012  . Gait disturbance 08/16/2012  . [place]ule [place] 03/02/2012  . Essential hypertension, benign 03/02/2012  . IBS (irritable bowel syndrome)   . PAC (premature atrial contraction)   . Insomnia   . GERD (gastroesophageal reflux disease)   . DYSTHYMIA 09/30/2009  . HEADACHE 09/21/2008  . HEPATIC CYST 08/14/2008  . Hyperlipidemia 08/06/2008    YJanene Harvey PT, DPT 04/13/18 6:38 PM   [facility] [address] [address]Phone: [phone]  Fax:  [phone] Name: RKALAYAH LESKEMRN: [id number] of Birth: 2Jan 26, 1967

## 2018-04-13 NOTE — Progress Notes (Signed)
[facility] [place], [place] [address] Phone: [phone] Subjective:     CC: Lumbar pain follow-up  AJO:INOMVEHMCN  MARYJO RAGON is a 52 y.o. female coming in with complaint of elbow pain. She said that she has been to 3 PT visits. Was expecting to get dry needling done but has not been able to schedule. Wanted to wait for appointment today to see if she needed to continue physical therapy.  Patient states that she is feeling approximately 80% better.      Past Medical History:  Diagnosis Date  . Allergy    Buspirone, Sulfa, Pantoprazole  . Benign fundic gland polyps of stomach 02/12/2016  . Chicken pox   . DYSTHYMIA 09/30/2009  . Functional dyspepsia   . GERD (gastroesophageal reflux disease)   . H/O adenomatous polyp of colon 10/04/2014   09/2014 - 3 mm adenoma - repeat colonoscopy 2022  . Hyperlipidemia    no meds  . Hypertension    pt denies  . IBS (irritable bowel syndrome)   . Insomnia   . MEDIAL EPICONDYLITIS, RIGHT 07/17/2010  . 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 Other abnormal glucose    Tiny hypodensity left hepatic lobe-CT of abdomen and pelvis 4/70/9628-ZMOQH 21mm periumblical hernia  . PAC (premature atrial contraction)   . Palpitations    Past Surgical History:  Procedure Laterality Date  . CESAREAN SECTION  01/2001  . COLONOSCOPY    . DE QUERVAIN'S RELEASE  2000   Right wrist area   Social History   Socioeconomic History  . Marital status: Married    Spouse name: Not on file  . Number of children: 2  . Years of education: Not on file  . Highest education level: Not on file  Occupational History  . Occupation: Xray/CT Engineer, production: [employer]  . Financial resource strain: Not on file  . Food insecurity:    Worry: Not on file    Inability: Not on file  . Transportation needs:    Medical: Not on file    Non-medical: Not on file  Tobacco Use  . Smoking status: Never Smoker  .  Smokeless tobacco: Never Used  Substance and Sexual Activity  . Alcohol use: Yes    Alcohol/week: 0.6 oz    Types: 1 Glasses of wine per week    Comment: occasional  . Drug use: No  . Sexual activity: Yes    Birth control/protection: Inserts  Lifestyle  . Physical activity:    Days per week: Not on file    Minutes per session: Not on file  . Stress: Not on file  Relationships  . Social connections:    Talks on phone: Not on file    Gets together: Not on file    Attends religious service: Not on file    Active member of club or organization: Not on file    Attends meetings of clubs or organizations: Not on file    Relationship status: Not on file  Other Topics Concern  . 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    Alcohol Use - yes-wine         Full Time    Married           Allergies  Allergen Reactions  . Buspirone Other (See Comments)     nightmare  . Esomeprazole  Other (See Comments)    headache  . Lansoprazole     Abdominal pain  . Omeprazole     Headache  . Sulfonamide Derivatives Rash     Rash   Family History  Problem Relation Age of Onset  . Hypertension Mother   . Hyperlipidemia Mother   . Fibrocystic breast disease Mother   . Hearing loss Mother   . Osteopenia Mother   . Prostate cancer Father 12       deceased  . Hypertension Father   . Diabetes Father   . Kidney disease Father   . Heart disease Father        MI  . Hyperlipidemia Father   . Other Brother        unknown  . Thyroid disease Sister   . Diabetes Sister        pre-diabetes  . Hypertension Sister   . Arthritis Sister   . Diabetes Sister   . Hypertension Sister   . Hyperlipidemia Sister   . Fibrocystic breast disease Sister   . Colon cancer Maternal Uncle        died in late 50's  . Diabetes Maternal Grandmother   . Prostate cancer Maternal Grandfather   . COPD Maternal Grandfather   . Hyperlipidemia Sister     . Hypertension Sister   . Rectal cancer Neg Hx   . Stomach cancer Neg Hx   . Esophageal cancer Neg Hx   . Pancreatic cancer Neg Hx      Past medical history, social, surgical and family history all reviewed in electronic medical record.  No pertanent information unless stated regarding to the chief complaint.   Review of Systems:Review of systems updated and as accurate as of 04/14/18  No headache, visual changes, nausea, vomiting, diarrhea, constipation, dizziness, abdominal pain, skin rash, fevers, chills, night sweats, weight loss, swollen lymph nodes, body aches, joint swelling, chest pain, shortness of breath, mood changes.  Positive muscle aches  Objective  Blood pressure 128/76, pulse (!) 57, height 5' 1.5" (1.562 m), weight 135 lb 9.6 oz (61.5 kg), SpO2 97 %. Systems examined below as of 04/14/18   General: No apparent distress alert and oriented x3 mood and affect normal, dressed appropriately.  HEENT: Pupils equal, extraocular movements intact  Respiratory: Patient's speak in full sentences and does not appear short of breath  Cardiovascular: No lower extremity edema, non tender, no erythema  Skin: Warm dry intact with no signs of infection or rash on extremities or on axial skeleton.  Abdomen: Soft nontender  Neuro: Cranial nerves II through XII are intact, neurovascularly intact in all extremities with 2+ DTRs and 2+ pulses.  Lymph: No lymphadenopathy of posterior or anterior cervical chain or axillae bilaterally.  Gait normal with good balance and coordination.  MSK:  Non tender with full range of motion and good stability and symmetric strength and tone of shoulders,  wrist, hip, knee and ankles bilaterally.  Elbow: Right Unremarkable to inspection. Range of motion full pronation, supination, flexion, extension. Strength is full to all of the above directions Stable to varus, valgus stress. Negative moving valgus stress test. Tender to palpation over the medial  epicondylar region minorly.  Significant improvement from previously.  Still some mild pain with resisted flexion of the wrist Ulnar nerve does not sublux. Negative cubital tunnel Tinel's.      Impression and Recommendations:     This case required medical decision making of moderate complexity.      Note:  This dictation was prepared with Dragon dictation along with smaller phrase technology. Any transcriptional errors that result from this process are unintentional.

## 2018-04-14 ENCOUNTER — Ambulatory Visit (INDEPENDENT_AMBULATORY_CARE_PROVIDER_SITE_OTHER): Payer: 59 | Admitting: Family Medicine

## 2018-04-14 DIAGNOSIS — M7701 Medial epicondylitis, right elbow: Secondary | ICD-10-CM

## 2018-04-14 MED ORDER — VITAMIN D (ERGOCALCIFEROL) 1.25 MG (50000 UNIT) PO CAPS: 50000.0000 [IU] | ORAL_CAPSULE | ORAL | 0 refills | Status: DC

## 2018-04-14 MED FILL — VIT D2 1.25 MG (50,000 UNIT: 1.25 MG | 56 days supply | Qty: 8 | Fill #0

## 2018-04-14 NOTE — Patient Instructions (Addendum)
Great to see you  You are doing amazing! Keep it up  Exercises at least 2 times a week  Once weekly vitamin D for 8 weeks to help with the calcium  See me again in 6 weeks if not perfect

## 2018-04-14 NOTE — Assessment & Plan Note (Signed)
Medial epicondylitis and improving.  80% better.  Encourage patient to discontinue physical therapy if she feels like she can do the home exercises.  Patient is getting intermittent relief with the dry needling.  We discussed icing regimen.  Patient will continue the other over-the-counter medications.  Patient will follow-up with me again 6 weeks for 1 more evaluation.

## 2018-04-18 MED FILL — LOSARTAN POTASSIUM-HCTZ 50-: 50-12.5 | 90 days supply | Qty: 90 | Fill #1

## 2018-04-18 MED FILL — POTASSIUM CL ER 20 MEQ TAB: 20 | 30 days supply | Qty: 60 | Fill #4

## 2018-04-18 MED FILL — NUVARING VAGINAL RING: 0.12-0.015 | 84 days supply | Qty: 3 | Fill #1

## 2018-04-19 ENCOUNTER — Encounter: Payer: Self-pay | Admitting: Physical Therapy

## 2018-04-19 ENCOUNTER — Ambulatory Visit: Payer: 59 | Admitting: Physical Therapy

## 2018-04-19 DIAGNOSIS — M25631 Stiffness of right wrist, not elsewhere classified: Secondary | ICD-10-CM | POA: Diagnosis not present

## 2018-04-19 DIAGNOSIS — R29898 Other symptoms and signs involving the musculoskeletal system: Secondary | ICD-10-CM

## 2018-04-19 DIAGNOSIS — M25521 Pain in right elbow: Secondary | ICD-10-CM | POA: Diagnosis not present

## 2018-04-19 NOTE — Therapy (Signed)
[facility] [address]  [place], [place], [address] Phone: [phone]   Fax:  [phone]  Physical Therapy Treatment  Patient Details  Name: Nicole Crosby MRN: [id number] Date of Birth: 1966-05-15 Referring Provider: Hulan Saas, DO   Encounter Date: 04/19/2018  PT End of Session - 04/19/18 1535    Visit Number  4    Number of Visits  5    Date for PT Re-Evaluation  04/20/18    Authorization Type  [facility]    PT Start Time  1535    PT Stop Time  1608    PT Time Calculation (min)  33 min    Activity Tolerance  Patient tolerated treatment well    Behavior During Therapy  [facility] for tasks assessed/performed       Past Medical History:  Diagnosis Date  . Allergy    Buspirone, Sulfa, Pantoprazole  . Benign fundic gland polyps of stomach 02/12/2016  . Chicken pox   . DYSTHYMIA 09/30/2009  . Functional dyspepsia   . GERD (gastroesophageal reflux disease)   . H/O adenomatous polyp of colon 10/04/2014   09/2014 - 3 mm adenoma - repeat colonoscopy 2022  . Hyperlipidemia    no meds  . Hypertension    pt denies  . IBS (irritable bowel syndrome)   . Insomnia   . MEDIAL EPICONDYLITIS, RIGHT 07/17/2010  . 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 Other abnormal glucose    Tiny hypodensity left hepatic lobe-CT of abdomen and pelvis 2/59/5638-VFIEP 79mm periumblical hernia  . PAC (premature atrial contraction)   . Palpitations     Past Surgical History:  Procedure Laterality Date  . CESAREAN SECTION  01/2001  . COLONOSCOPY    . DE QUERVAIN'S RELEASE  2000   Right wrist area    There were no vitals filed for this visit.  Subjective Assessment - 04/19/18 1539    Subjective  Pt reports she woke up with increased pain today but then was lifting some heavy boxes & felt something tear in lower upper arm.     Diagnostic tests  04/22 [place] of R elbow: ulnar nerve subluxation; 05/16: possible interval healing of medial  epicondylitis     Patient Stated Goals  get over it quickly, flex elbow and externally rotate elbow without pain    Currently in Pain?  Yes    Pain Score  3     Pain Location  Elbow    Pain Orientation  Right;Medial    Pain Descriptors / Indicators  Discomfort;Sore    Pain Type  Acute pain;Chronic pain                       [facility] Adult PT Treatment/Exercise - 04/19/18 1535      Elbow Exercises   Forearm Pronation  Strengthening;Right;15 reps;Seated;Bar weights/barbell    Bar Weights/Barbell (Forearm Pronation)  2 lbs      Shoulder Exercises: ROM/Strengthening   UBE (Upper Arm Bike)  L2 3'/3'      Shoulder Exercises: Stretch   Other Shoulder Stretches  --      Wrist Exercises   Wrist Flexion  Strengthening;Right;15 reps;Seated;Bar weights/barbell    Bar Weights/Barbell (Wrist Flexion)  1 lb    Wrist Flexion Limitations  AAROM for concentric motion, focusing on slow eccentric release    Other wrist exercises  R wrist flexor stretch 2x30 sec    Other wrist exercises  B wrist yellow  TB roll-up & unroll over PVC pipe x10      Manual Therapy   Manual Therapy  Soft tissue mobilization;Myofascial release    Soft tissue mobilization  R wrist flexors, medial tricep- TTP and soft tissue restriction in these areas (better after DN)    Myofascial Release  manual TPR to R wrist flxor group       Trigger Point Dry Needling - 04/19/18 1535    Consent Given?  Yes    Education Handout Provided  Yes    Muscles Treated Upper Body  -- R medial triceps & wrist flexor group: + twitch response           PT Education - 04/19/18 1539    Education provided  Yes    Education Details  Role of DN and expected response to treatment    Person(s) Educated  Patient    Methods  Explanation;Handout    Comprehension  Verbalized understanding       PT Short Term Goals - 04/06/18 1729      PT SHORT TERM GOAL #1   Title  Patient to be independent with initial HEP.    Time  2     Period  Weeks    Status  Achieved        PT Long Term Goals - 04/19/18 1611      PT LONG TERM GOAL #1   Title  Patient to be independent with advanced HEP.    Time  4    Period  Weeks    Status  Achieved      PT LONG TERM GOAL #2   Title  Patient to demonstrate >=4+/5 strength in R wrist and elbow.    Time  4    Period  Weeks    Status  On-going      PT LONG TERM GOAL #3   Title  Patient to demonstrate WNL AROM in R wrist and elbow without pain.    Time  4    Period  Weeks    Status  Achieved      PT LONG TERM GOAL #4   Title  Patient to report <=1/10 pain in R elbow during entire work shift.     Time  4    Period  Weeks    Status  Achieved            Plan - 04/19/18 1542    Clinical Impression Statement  Nicole Crosby referred back to PT for trial of DN for at least 2 remaining visits in initial POC. Pt reporting new potential trauma to R elbow today with lifting heavy boxes where she felt a tearing sensation, but only reporting minimal increase in pain which she feels like was already present upon waking this morning. Increased muscle tension continues to be present in R medial triceps and wrist flexor group - DN performed, with pt's verbal consent, in conjunction with manual therapy with positive twitch response elicited and palpable decrease in muscle tension. Manual therapy/DN followed by stretching and strengthening with eccentric emphasis - pt encouraged to continue with with existing HEP using modalities PRN for pain management.    PT Treatment/Interventions  ADLs/Self Care Home Management;Cryotherapy;Electrical Stimulation;Iontophoresis 4mg /ml Dexamethasone;Moist Heat;Ultrasound;Functional mobility training;Therapeutic activities;Therapeutic exercise;Manual techniques;Patient/family education;Passive range of motion;Dry needling;Energy conservation;Splinting;Taping;Vasopneumatic Device    PT Next Visit Plan  30 day hold at this time    Consulted and Agree with Plan of  Care  Patient       Patient  will benefit from skilled therapeutic intervention in order to improve the following deficits and impairments:  Decreased activity tolerance, Decreased strength, Impaired UE functional use, Pain, Decreased range of motion, Postural dysfunction  Visit Diagnosis: Pain in right elbow  Stiffness of right wrist, not elsewhere classified  Other symptoms and signs involving the musculoskeletal system     Problem List Patient Active Problem List   Diagnosis Date Noted  . Medial epicondylitis, right 03/17/2018  . 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 Vitamin D deficiency 11/03/2017  . Shortened PR interval 09/27/2017  . Greater trochanteric bursitis of left hip 05/12/2017  . B12 deficiency 01/22/2017  . Memory changes 08/28/2016  . 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 Benign fundic gland polyps of stomach 02/12/2016  . Paresthesia 12/17/2015  . Bladder spasm 12/13/2014  . H/O adenomatous polyp of colon 10/04/2014  . Fatigue 09/03/2014  . Chest pain 03/14/2014  . Decreased hearing of right ear 02/05/2014  . Other malaise and fatigue 01/11/2014  . Maxillary sinusitis 10/13/2013  . Screening for malignant neoplasm of cervix 05/31/2013  . Physical exam 05/31/2013  . Pelvic pain in female 05/31/2013  . Left L3 lumbar radiculitis 09/23/2012  . Neck pain 08/22/2012  . Gait disturbance 08/16/2012  . Adrenal nodule ([place]) 03/02/2012  . Essential hypertension, benign 03/02/2012  . IBS (irritable bowel syndrome)   . PAC (premature atrial contraction)   . Insomnia   . GERD (gastroesophageal reflux disease)   . DYSTHYMIA 09/30/2009  . HEADACHE 09/21/2008  . HEPATIC CYST 08/14/2008  . Hyperlipidemia 08/06/2008    Percival Spanish, PT, MPT 04/19/2018, 5:25 PM  [facility] [address]  [place], [place], [address] Phone: [phone]   Fax:   [phone]  Name: Nicole Crosby MRN: [id number] Date of Birth: Mar 18, 1966

## 2018-04-19 NOTE — Patient Instructions (Signed)

## 2018-04-20 ENCOUNTER — Encounter: Payer: 59 | Admitting: Physical Therapy

## 2018-04-27 ENCOUNTER — Encounter: Payer: 59 | Admitting: Physical Therapy

## 2018-05-02 ENCOUNTER — Encounter: Payer: Self-pay | Admitting: Family Medicine

## 2018-05-03 ENCOUNTER — Encounter: Payer: Self-pay | Admitting: Physical Therapy

## 2018-05-03 ENCOUNTER — Ambulatory Visit: Payer: 59 | Attending: Family Medicine | Admitting: Physical Therapy

## 2018-05-03 DIAGNOSIS — M25521 Pain in right elbow: Secondary | ICD-10-CM | POA: Diagnosis not present

## 2018-05-03 DIAGNOSIS — M25631 Stiffness of right wrist, not elsewhere classified: Secondary | ICD-10-CM | POA: Insufficient documentation

## 2018-05-03 DIAGNOSIS — R29898 Other symptoms and signs involving the musculoskeletal system: Secondary | ICD-10-CM | POA: Diagnosis not present

## 2018-05-03 NOTE — Therapy (Addendum)
[facility] [place] [address]  [place], [place], [address] Phone: [phone]   Fax:  [phone]  Physical Therapy Treatment  Patient Details  Name: Nicole Crosby MRN: [id number] Date of Birth: 03-Jul-1966 Referring Provider: Hulan Saas, DO   Encounter Date: 05/03/2018  PT End of Session - 05/03/18 1617    Visit Number  5    Number of Visits  5    Date for PT Re-Evaluation  04/20/18    Authorization Type  [facility]    PT Start Time  1601    PT Stop Time  1700    PT Time Calculation (min)  43 min    Activity Tolerance  Patient tolerated treatment well    Behavior During Therapy  [facility] for tasks assessed/performed       Past Medical History:  Diagnosis Date  . Allergy    Buspirone, Sulfa, Pantoprazole  . Benign fundic gland polyps of stomach 02/12/2016  . Chicken pox   . DYSTHYMIA 09/30/2009  . Functional dyspepsia   . GERD (gastroesophageal reflux disease)   . H/O adenomatous polyp of colon 10/04/2014   09/2014 - 3 mm adenoma - repeat colonoscopy 2022  . Hyperlipidemia    no meds  . Hypertension    pt denies  . IBS (irritable bowel syndrome)   . Insomnia   . MEDIAL EPICONDYLITIS, RIGHT 07/17/2010  . 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 Other abnormal glucose    Tiny hypodensity left hepatic lobe-CT of abdomen and pelvis 0/93/2355-DDUKG 103m periumblical hernia  . PAC (premature atrial contraction)   . Palpitations     Past Surgical History:  Procedure Laterality Date  . CESAREAN SECTION  01/2001  . COLONOSCOPY    . DE QUERVAIN'S RELEASE  2000   Right wrist area    There were no vitals filed for this visit.  Subjective Assessment - 05/03/18 1619    Subjective  Pt reports she noted some relief from DN last visit. Was on vacation last week and did not keep up with HEP. Now noticing increased pain with lifting, esp with palm up, since being bakc to work.    Diagnostic tests  04/22 [place] elbow: ulnar nerve  subluxation; 05/16: possible interval healing of medial epicondylitis     Patient Stated Goals  get over it quickly, flex elbow and externally rotate elbow without pain    Currently in Pain?  No/denies    Pain Score  0-No pain up to 3/10 withg liftin palm up    Pain Location  Elbow    Pain Orientation  Right;Medial         [facility]      Assessment   Medical Diagnosis  Medial Epicondylitis of R elbow    Referring Provider  ZHulan Saas DO    Onset Date/Surgical Date  11/23/17    Hand Dominance  Right    Next MD Visit  05/25/18      Observation/Other Assessments   Focus on Therapeutic Outcomes (FOTO)   Elbow: 72% (28% limited)      Strength   Left Elbow Flexion  4+/5 mild pain in wrist flexors    Left Elbow Extension  4+/5    Right Wrist Flexion  4+/5 mild pain in wrist flexors    Right Wrist Extension  4+/5 mild pain in wrist extensors  [facility] Adult PT Treatment/Exercise - 05/03/18 1617      Exercises   Exercises  Elbow;Wrist;Shoulder      Shoulder Exercises: ROM/Strengthening   UBE (Upper Arm Bike)  L2.5 3'/3'      Wrist Exercises   Other wrist exercises  B eccentric wrist flexion & extension rolling yellow TB over PVC pipe x10      Manual Therapy   Manual Therapy  Soft tissue mobilization;Myofascial release    Soft tissue mobilization  R wrist flexors, pronator teres, medial triceps    Myofascial Release  manual TPR to R wrist flexor group & pronator teres       Trigger Point Dry Needling - 05/03/18 1617    Consent Given?  Yes    Muscles Treated Upper Body  -- R wrist flexor group & pronator: + twitch response           PT Education - 05/03/18 1705    Education provided  Yes    Education Details  HEP review/update    Person(s) Educated  Patient    Methods  Explanation;Demonstration;Handout    Comprehension  Verbalized understanding;Returned demonstration       PT Short Term Goals - 04/06/18 1729       PT SHORT TERM GOAL #1   Title  Patient to be independent with initial HEP.    Time  2    Period  Weeks    Status  Achieved        PT Long Term Goals - 05/03/18 1642      PT LONG TERM GOAL #1   Title  Patient to be independent with advanced HEP.    Status  Achieved      PT LONG TERM GOAL #2   Title  Patient to demonstrate >=4+/5 strength in R wrist and elbow.    Status  Achieved      PT LONG TERM GOAL #3   Title  Patient to demonstrate WNL AROM in R wrist and elbow without pain.    Status  Achieved      PT LONG TERM GOAL #4   Title  Patient to report <=1/10 pain in R elbow during entire work shift.     Time  --    Status  Partially Met            Plan - 05/03/18 1622    Clinical Impression Statement  [name] improvement in R medial elbow pain with no pain while on vacation last week and only mild pain with lifting at present but no pain at rest. R UE Strength now symmetrical to L with MMT but pt noting some weakness (more easily fatigued) and continued preference to use her L UE with some tasks. 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 Goals mostly met at present and pt feels ready to try transitioning to HEP, but will keep chart open for 30 days in the event that symptoms do not continue to improve.    PT Treatment/Interventions  ADLs/Self Care Home Management;Cryotherapy;Electrical Stimulation;Iontophoresis 4mg/ml Dexamethasone;Moist Heat;Ultrasound;Functional mobility training;Therapeutic activities;Therapeutic exercise;Manual techniques;Patient/family education;Passive range of motion;Dry needling;Energy conservation;Splinting;Taping;Vasopneumatic Device    PT Next Visit Plan  30 day hold at this time    Consulted and Agree with Plan of Care  Patient       Patient will benefit from skilled therapeutic intervention in order to improve the following deficits and  impairments:    Decreased activity tolerance, Decreased strength, Impaired UE functional use, Pain, Decreased range of motion, Postural dysfunction  Visit Diagnosis: Pain in right elbow  Stiffness of right wrist, not elsewhere classified  Other symptoms and signs involving the musculoskeletal system     Problem List Patient Active Problem List   Diagnosis Date Noted  . Medial epicondylitis, right 03/17/2018  . 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 Vitamin D deficiency 11/03/2017  . Shortened PR interval 09/27/2017  . Greater trochanteric bursitis of left hip 05/12/2017  . B12 deficiency 01/22/2017  . Memory changes 08/28/2016  . 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 Benign fundic gland polyps of stomach 02/12/2016  . Paresthesia 12/17/2015  . Bladder spasm 12/13/2014  . H/O adenomatous polyp of colon 10/04/2014  . Fatigue 09/03/2014  . Chest pain 03/14/2014  . Decreased hearing of right ear 02/05/2014  . Other malaise and fatigue 01/11/2014  . Maxillary sinusitis 10/13/2013  . Screening for malignant neoplasm of cervix 05/31/2013  . Physical exam 05/31/2013  . Pelvic pain in female 05/31/2013  . Left L3 lumbar radiculitis 09/23/2012  . Neck pain 08/22/2012  . Gait disturbance 08/16/2012  . Adrenal nodule (HCC) 03/02/2012  . Essential hypertension, benign 03/02/2012  . IBS (irritable bowel syndrome)   . PAC (premature atrial contraction)   . Insomnia   . GERD (gastroesophageal reflux disease)   . DYSTHYMIA 09/30/2009  . HEADACHE 09/21/2008  . HEPATIC CYST 08/14/2008  . Hyperlipidemia 08/06/2008    JoAnne M Kreis, PT, MPT 05/03/2018, 5:40 PM  [facility] [place] [address]  [address] [place], [place], [address] Phone: [phone]   Fax:  [phone]  Name: Yancy B Montejano MRN: [id number] Date of Birth: 04/12/1966  PHYSICAL THERAPY DISCHARGE SUMMARY  Visits from Start  of Care: 5  Current functional level related to goals / functional outcomes: See above; patient did not return after 30 day hold   Remaining deficits: See above   Education / Equipment: HEP  Plan: Patient agrees to discharge.  Patient goals were partially met. Patient is being discharged due to being pleased with the current functional level.  ?????     Yevgeniya Kovalenko, PT, DPT 06/21/18 9:21 AM  

## 2018-05-04 ENCOUNTER — Encounter: Payer: Self-pay | Admitting: Family Medicine

## 2018-05-04 ENCOUNTER — Other Ambulatory Visit: Payer: Self-pay

## 2018-05-04 ENCOUNTER — Ambulatory Visit (INDEPENDENT_AMBULATORY_CARE_PROVIDER_SITE_OTHER): Payer: 59 | Admitting: Family Medicine

## 2018-05-04 VITALS — BP 118/84 | HR 97 | Temp 99.1°F | Resp 15 | Ht 61.5 in | Wt 136.6 lb

## 2018-05-04 DIAGNOSIS — H01119 Allergic dermatitis of unspecified eye, unspecified eyelid: Secondary | ICD-10-CM

## 2018-05-04 DIAGNOSIS — G43809 Other migraine, not intractable, without status migrainosus: Secondary | ICD-10-CM

## 2018-05-04 DIAGNOSIS — I10 Essential (primary) hypertension: Secondary | ICD-10-CM | POA: Diagnosis not present

## 2018-05-04 DIAGNOSIS — L2082 Flexural eczema: Secondary | ICD-10-CM

## 2018-05-04 MED ORDER — CRISABOROLE 2 % EX OINT: 1.0000 "application " | TOPICAL_OINTMENT | Freq: Two times a day (BID) | CUTANEOUS | 1 refills | Status: DC

## 2018-05-04 NOTE — Progress Notes (Signed)
   Subjective:    Patient ID: Nicole Crosby, female    DOB: 02-17-1966, 52 y.o.   MRN: [id number]  HPI HTN- chronic problem, on Losartan HCTZ 50/12.5mg  daily w/ good control.  Denies CP, SOB, HAs, visual changes, edema.  Dry eyelids- sxs started in May.  Some itching.  Has tried OTC hydrocortisone cream, Aveeno, Eucerin- no relief  Pain R temple- developed in Feb.  Resolved in April.  Returned May.  Relief w/ pressure on the area.  No pain w/ opening jaw.  Occasional ear pain.  Pain will also encircle R eye.  Rash- on inside of elbows bilaterally, started a couple of weeks ago.  Coworkers have similar.  Intermittently itchy.  Some improvement w/ hydrocortisone cream.   Review of Systems For ROS see HPI     Objective:   Physical Exam  Constitutional: She is oriented to person, place, and time. She appears well-developed and well-nourished. No distress.  HENT:  Head: Normocephalic and atraumatic.  Right Ear: External ear normal.  Left Ear: External ear normal.  No TTP over temporal artery or TMJ  Eyes: Pupils are equal, round, and reactive to light. Conjunctivae and EOM are normal.  Neck: Normal range of motion. Neck supple. No thyromegaly present.  Cardiovascular: Normal rate, regular rhythm, normal heart sounds and intact distal pulses.  No murmur heard. Pulmonary/Chest: Effort normal and breath sounds normal. No respiratory distress.  Abdominal: Soft. She exhibits no distension. There is no tenderness.  Musculoskeletal: She exhibits no edema.  Lymphadenopathy:    She has no cervical adenopathy.  Neurological: She is alert and oriented to person, place, and time.  Skin: Skin is warm and dry.  Eczematous patches on flexor creases of arms bilaterally Dry, flaking upper eyelids bilaterally  Psychiatric: She has a normal mood and affect. Her behavior is normal.  Vitals reviewed.         Assessment & Plan:  R temple pain- new.  No TTP over temporal artery, no TTP over TMJ.   No pain w/ opening/closing mouth.  Pt to log sxs and take Excedrin at first sign of pain to determine if this will abort her sxs.  Suspect a migraine variant but will follow.

## 2018-05-04 NOTE — Patient Instructions (Signed)
Schedule your complete physical in 6 months We'll notify you of your lab results and make any changes if needed Apply a small amount (it goes a long way!) of the Eucrisa twice daily to face and arms Keep a log of your temple/ear pain.  At first sign of pain, take 2 Excedrin Migraine and note whether this resolves Drink plenty of fluids Call with any questions or concerns Happy 4th!!!

## 2018-05-05 LAB — BASIC METABOLIC PANEL
BUN: 14 mg/dL (ref 7–25)
CO2: 28 mmol/L (ref 20–32)
Calcium: 9.2 mg/dL (ref 8.6–10.4)
Chloride: 101 mmol/L (ref 98–110)
Creat: 0.64 mg/dL (ref 0.50–1.05)
Glucose, Bld: 106 mg/dL — ABNORMAL HIGH (ref 65–99)
Potassium: 4 mmol/L (ref 3.5–5.3)
Sodium: 137 mmol/L (ref 135–146)

## 2018-05-05 LAB — CBC WITH DIFFERENTIAL/PLATELET
Basophils Absolute: 57 cells/uL (ref 0–200)
Basophils Relative: 0.8 %
Eosinophils Absolute: 78 cells/uL (ref 15–500)
Eosinophils Relative: 1.1 %
HCT: 39 % (ref 35.0–45.0)
Hemoglobin: 13.1 g/dL (ref 11.7–15.5)
Lymphs Abs: 2868 cells/uL (ref 850–3900)
MCH: 30.1 pg (ref 27.0–33.0)
MCHC: 33.6 g/dL (ref 32.0–36.0)
MCV: 89.7 fL (ref 80.0–100.0)
MPV: 8.9 fL (ref 7.5–12.5)
Monocytes Relative: 7.6 %
Neutro Abs: 3557 cells/uL (ref 1500–7800)
Neutrophils Relative %: 50.1 %
Platelets: 308 10*3/uL (ref 140–400)
RBC: 4.35 10*6/uL (ref 3.80–5.10)
RDW: 11.7 % (ref 11.0–15.0)
Total Lymphocyte: 40.4 %
WBC mixed population: 540 cells/uL (ref 200–950)
WBC: 7.1 10*3/uL (ref 3.8–10.8)

## 2018-05-10 MED FILL — EUCRISA 2% OINTMENT: 2 | 30 days supply | Qty: 60 | Fill #0

## 2018-05-16 MED FILL — POTASSIUM CL ER 20 MEQ TAB: 20 | 30 days supply | Qty: 60 | Fill #5

## 2018-05-17 DIAGNOSIS — H01119 Allergic dermatitis of unspecified eye, unspecified eyelid: Secondary | ICD-10-CM | POA: Insufficient documentation

## 2018-05-17 DIAGNOSIS — L2082 Flexural eczema: Secondary | ICD-10-CM | POA: Insufficient documentation

## 2018-05-17 NOTE — Assessment & Plan Note (Signed)
New.  Reviewed dx w/ pt as well as tx options.  Mild improvement w/ Hydrocortisone cream.  Will start Eucrisa as pt also has eyelid sxs.  Pt expressed understanding and is in agreement w/ plan.

## 2018-05-17 NOTE — Assessment & Plan Note (Signed)
Chronic problem.  Good control.  Asymptomatic.  Check labs.  No anticipated med changes. Will follow. 

## 2018-05-17 NOTE — Assessment & Plan Note (Signed)
New.  Start Eucrisa to avoid steroid use on eyelids.  Pt expressed understanding and is in agreement w/ plan.

## 2018-05-25 ENCOUNTER — Ambulatory Visit: Payer: 59 | Admitting: Family Medicine

## 2018-06-20 MED FILL — POTASSIUM CL ER 20 MEQ TAB: 20 | 30 days supply | Qty: 60 | Fill #6

## 2018-07-12 MED FILL — NUVARING VAGINAL RING: 0.12-0.015 | 84 days supply | Qty: 3 | Fill #2

## 2018-07-14 ENCOUNTER — Telehealth: Payer: Self-pay

## 2018-07-14 NOTE — Telephone Encounter (Signed)
LM requesting call back to discuss symptoms (balance issues and confusion). Pt is scheduled for OV with PCP on 07/15/18.

## 2018-07-15 ENCOUNTER — Other Ambulatory Visit: Payer: Self-pay

## 2018-07-15 ENCOUNTER — Ambulatory Visit (HOSPITAL_BASED_OUTPATIENT_CLINIC_OR_DEPARTMENT_OTHER)
Admission: RE | Admit: 2018-07-15 | Discharge: 2018-07-15 | Disposition: A | Discharge status: Home / Self Care | Payer: 59 | Source: Ambulatory Visit | Attending: Family Medicine | Admitting: Family Medicine

## 2018-07-15 ENCOUNTER — Encounter: Payer: Self-pay | Admitting: Family Medicine

## 2018-07-15 ENCOUNTER — Ambulatory Visit (INDEPENDENT_AMBULATORY_CARE_PROVIDER_SITE_OTHER): Payer: 59 | Admitting: Family Medicine

## 2018-07-15 VITALS — BP 141/88 | HR 81 | Temp 98.7°F | Resp 16 | Ht 62.0 in | Wt 137.1 lb

## 2018-07-15 DIAGNOSIS — R519 Headache, unspecified: Secondary | ICD-10-CM

## 2018-07-15 DIAGNOSIS — R410 Disorientation, unspecified: Secondary | ICD-10-CM

## 2018-07-15 DIAGNOSIS — R42 Dizziness and giddiness: Secondary | ICD-10-CM | POA: Diagnosis not present

## 2018-07-15 DIAGNOSIS — R51 Headache: Secondary | ICD-10-CM | POA: Insufficient documentation

## 2018-07-15 DIAGNOSIS — R200 Anesthesia of skin: Secondary | ICD-10-CM

## 2018-07-15 NOTE — Progress Notes (Signed)
   Subjective:    Patient ID: Nicole Crosby, female    DOB: Dec 30, 1965, 52 y.o.   MRN: [id number]  HPI 'I probably should have gone to the ER 2 days ago'- pt reports not feeling well since her episode of vertigo in Feb.  Sxs have been intermittent.  2 weeks ago noticed 'confusion and disorientation'.  4 days ago noticed issues w/ balance- felt like she was leaning towards 1 side.  Also developed neck pain radiating to base of head.  2 days ago developed L jaw numbness from midline to angle of mandible.  Pt tested sensation using objects- pin- and was able to feel w/o difficulty.  No facial drooping.  No precipitating event.  Denies increased stress.  Pt reports feeling disoriented when driving- unsure of where she is despite being very familiar with the area.  Daughter recently started senior year, son is looking for a job, husband has to have shoulder surgery.  Pt has some instances of feeling overwhelmed.  No difficulty w/ swallowing or speaking.  No recent falls.  No recent visual changes.   Review of Systems For ROS see HPI     Objective:   Physical Exam  Constitutional: She is oriented to person, place, and time. She appears well-developed and well-nourished. No distress.  HENT:  Head: Normocephalic and atraumatic.  Right Ear: Tympanic membrane and ear canal normal. Tympanic membrane is not erythematous and not retracted. No middle ear effusion.  Left Ear: Tympanic membrane and ear canal normal. Tympanic membrane is not erythematous and not retracted.  No middle ear effusion.  Nose: Nose normal.  Mouth/Throat: Oropharynx is clear and moist.  Eyes: Pupils are equal, round, and reactive to light. Conjunctivae and EOM are normal.  Neck: No thyromegaly present.  Bilateral trap spasm  Cardiovascular: Normal rate, regular rhythm and normal heart sounds.  Pulmonary/Chest: Effort normal and breath sounds normal. No respiratory distress. She has no wheezes.  Lymphadenopathy:    She has no  cervical adenopathy.  Neurological: She is alert and oriented to person, place, and time. She displays normal reflexes. A sensory deficit (subjective decreased sensation on L side of face) is present. No cranial nerve deficit. Coordination normal.  Skin: Skin is warm and dry. No erythema.  Psychiatric: She has a normal mood and affect. Her behavior is normal. Thought content normal.  Vitals reviewed.         Assessment & Plan:  Occipital HA- new.  Suspect this is due to her trap spasm.  NSAIDs and heat for neck spasm.  Will get head CT due to perceived sensory deficit.  Reviewed supportive care and red flags that should prompt return.  Pt expressed understanding and is in agreement w/ plan.   Facial numbness- new.  L sided.  No motor deficits on PE.  Subjective sensory deficit.  Get head CT.  Pt has neuro- encouraged her to call and schedule.  Reviewed supportive care and red flags that should prompt return.  Pt expressed understanding and is in agreement w/ plan.   Confusion- new.  Suspect this is stress related w/ family issues.  Check head CT.  Encouraged her to monitor her sxs and determine whether her memory issues occur when overwhelmed or in other settings as well.  Will follow.

## 2018-07-15 NOTE — Patient Instructions (Signed)
Go to the [facility] for your CT scan I'll notify you of your results and we can determine the next steps If your symptoms change or worsen, please go to the ER! Use a heating pad for neck pain/spasm Drink plenty of fluids! Call with any questions or concerns Hang in there!!!

## 2018-07-25 ENCOUNTER — Other Ambulatory Visit: Payer: Self-pay | Admitting: Family Medicine

## 2018-07-25 MED FILL — LOSARTAN-HCTZ 50-12.5 MG TA: 50-12.5 | 90 days supply | Qty: 90 | Fill #0

## 2018-07-25 MED FILL — POTASSIUM CL ER 20 MEQ TAB: 20 | 30 days supply | Qty: 60 | Fill #0

## 2018-08-01 DIAGNOSIS — H40019 Open angle with borderline findings, low risk, unspecified eye: Secondary | ICD-10-CM | POA: Diagnosis not present

## 2018-08-01 DIAGNOSIS — H524 Presbyopia: Secondary | ICD-10-CM | POA: Diagnosis not present

## 2018-08-04 ENCOUNTER — Other Ambulatory Visit (HOSPITAL_COMMUNITY)
Admission: RE | Admit: 2018-08-04 | Discharge: 2018-08-04 | Disposition: A | Discharge status: Home / Self Care | Payer: 59 | Source: Ambulatory Visit | Attending: Psychiatry | Admitting: Psychiatry

## 2018-08-04 DIAGNOSIS — M316 Other giant cell arteritis: Secondary | ICD-10-CM | POA: Insufficient documentation

## 2018-08-04 LAB — C-REACTIVE PROTEIN: CRP: 1 mg/dL — ABNORMAL HIGH (ref <1.0)

## 2018-08-04 LAB — SEDIMENTATION RATE: Sed Rate: 10 mm/hr (ref 0–22)

## 2018-08-08 ENCOUNTER — Encounter: Payer: Self-pay | Admitting: Family Medicine

## 2018-08-25 NOTE — Progress Notes (Signed)
Referring-Katherine Tabori MD Reason for referral-Dyspnea and CP  HPI: HPI: 52 yo female for evaluation of dyspnea and chest pain at request of Annye Asa MD. Seen previously but not since 2015. Echocardiogram in August of 2010 revealed normal LV function and no valvular abnormalities. Stress echocardiogram December 2010 normal. CardioNet 2010 showed sinus to sinus tachycardia with rare PAC. Stress echo 6/15 normal. Labs 7/19 showed normal Hgb. ESR 10/19 normal.  Patient recently notes occasional chest pain that is substernal in location and described as pressure.  Lasts approximately 2 minutes and radiates outwards.  It does not radiate to the arms, back or neck.  No associated symptoms.  Resolve spontaneously.  Not exertional, pleuritic or positional.  She also has some dyspnea on exertion but no orthopnea, PND or pedal edema.  Rare palpitations.  No syncope.  Cardiology asked to evaluate.  Current Outpatient Medications  Medication Sig Dispense Refill  . Calcium Carbonate-Vit D-Min (CALCIUM 1200 PO) Take 1 capsule by mouth daily.    . cholecalciferol (VITAMIN D) 1000 UNITS tablet Take 2,000 Units by mouth daily.     Stasia Cavalier (EUCRISA) 2 % OINT Apply 1 application topically 2 (two) times daily. 60 g 1  . diazepam (VALIUM) 5 MG tablet Take 1 tablet (5 mg total) by mouth every 8 (eight) hours as needed (vertigo). 8 tablet 0  . famotidine (PEPCID) 10 MG tablet Take 10 mg by mouth 2 (two) times daily as needed for heartburn or indigestion.    Marland Kitchen losartan-hydrochlorothiazide (HYZAAR) 50-12.5 MG tablet TAKE 1 TABLET BY MOUTH DAILY. 90 tablet 1  . meclizine (ANTIVERT) 25 MG tablet Take 25 mg by mouth 3 (three) times daily as needed for dizziness.    Marland Kitchen NUVARING 0.12-0.015 MG/24HR vaginal ring INSERT 1 RING VAGINALLY AND LEAVE IN PLACE FOR 3 CONSECUTIVE WEEKS, THEN REMOVE FOR 1 WEEK. 1 each 11  . OSCIMIN 0.125 MG SUBL   5  . potassium chloride SA (K-DUR,KLOR-CON) 20 MEQ tablet TAKE 1 TABLET  BY MOUTH TWICE DAILY 60 tablet 6  . vitamin B-12 (CYANOCOBALAMIN) 500 MCG tablet Take 500 mcg by mouth daily.     No current facility-administered medications for this visit.     Allergies  Allergen Reactions  . Buspirone Other (See Comments)     nightmare  . Esomeprazole Other (See Comments)    headache  . Lansoprazole     Abdominal pain  . Omeprazole     Headache  . Sulfonamide Derivatives Rash     Rash     Past Medical History:  Diagnosis Date  . Allergy    Buspirone, Sulfa, Pantoprazole  . Benign fundic gland polyps of stomach 02/12/2016  . Chicken pox   . DYSTHYMIA 09/30/2009  . Functional dyspepsia   . GERD (gastroesophageal reflux disease)   . H/O adenomatous polyp of colon 10/04/2014   09/2014 - 3 mm adenoma - repeat colonoscopy 2022  . Hyperlipidemia    no meds  . Hypertension    pt denies  . IBS (irritable bowel syndrome)   . Insomnia   . MEDIAL EPICONDYLITIS, RIGHT 07/17/2010  . 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 Other abnormal glucose    Tiny hypodensity left hepatic lobe-CT of abdomen and pelvis 1/89/8421-IZXYO 29m periumblical hernia  . PAC (premature atrial contraction)   . Palpitations     Past Surgical History:  Procedure Laterality Date  . CESAREAN SECTION  01/2001  . COLONOSCOPY    . [place]  [place]  Right wrist area    Social History   Socioeconomic History  . Marital status: Married    Spouse name: Not on file  . Number of children: 2  . Years of education: Not on file  . Highest education level: Not on file  Occupational History  . Occupation: Xray/CT Engineer, production: [employer]  . Financial resource strain: Not on file  . Food insecurity:    Worry: Not on file    Inability: Not on file  . Transportation needs:    Medical: Not on file    Non-medical: Not on file  Tobacco Use  . Smoking status: Never Smoker  . Smokeless tobacco: Never Used  Substance and Sexual Activity  . Alcohol use: Yes     Alcohol/week: 1.0 standard drinks    Types: 1 Glasses of wine per week    Comment: occasional  . Drug use: No  . Sexual activity: Yes    Birth control/protection: Inserts  Lifestyle  . Physical activity:    Days per week: Not on file    Minutes per session: Not on file  . Stress: Not on file  Relationships  . Social connections:    Talks on phone: Not on file    Gets together: Not on file    Attends religious service: Not on file    Active member of club or organization: Not on file    Attends meetings of clubs or organizations: Not on file    Relationship status: Not on file  . Intimate partner violence:    Fear of current or ex partner: Not on file    Emotionally abused: Not on file    Physically abused: Not on file    Forced sexual activity: Not on file  Other Topics Concern  . 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    Alcohol Use - yes-wine         Full Time    Married            Family History  Problem Relation Age of Onset  . Hypertension Mother   . Hyperlipidemia Mother   . Fibrocystic breast disease Mother   . Hearing loss Mother   . Osteopenia Mother   . Prostate cancer Father 38       deceased  . Hypertension Father   . Diabetes Father   . Kidney disease Father   . Heart disease Father        MI  . Hyperlipidemia Father   . Other Brother        unknown  . Thyroid disease Sister   . Diabetes Sister        pre-diabetes  . Hypertension Sister   . Arthritis Sister   . Diabetes Sister   . Hypertension Sister   . Hyperlipidemia Sister   . Fibrocystic breast disease Sister   . Colon cancer Maternal Uncle        died in late 8's  . Diabetes Maternal Grandmother   . Prostate cancer Maternal Grandfather   . COPD Maternal Grandfather   . Hyperlipidemia Sister   . Hypertension Sister   . Rectal cancer Neg Hx   . Stomach cancer Neg Hx   . Esophageal cancer Neg Hx   . Pancreatic cancer  Neg Hx     ROS: no fevers or chills, productive cough, hemoptysis, dysphasia, odynophagia,  melena, hematochezia, dysuria, hematuria, rash, seizure activity, orthopnea, PND, pedal edema, claudication. Remaining systems are negative.  Physical Exam:   Blood pressure 131/84, pulse 90, height 5' 1.5" (1.562 m), weight 142 lb 1.9 oz (64.5 kg).  General:  Well developed/well nourished in NAD Skin warm/dry Patient not depressed No peripheral clubbing Back-normal HEENT-normal/normal eyelids Neck supple/normal carotid upstroke bilaterally; no bruits; no JVD; no thyromegaly chest - CTA/ normal expansion CV - RRR/normal S1 and S2; no murmurs, rubs or gallops;  PMI nondisplaced Abdomen -NT/ND, no HSM, no mass, + bowel sounds, no bruit 2+ femoral pulses, no bruits Ext-no edema, chords, 2+ DP Neuro-grossly nonfocal  ECG -normal sinus rhythm at a rate of 90.  No ST changes.  Personally reviewed  A/P  1 chest pain-symptoms are atypical.  We will arrange an exercise treadmill for risk stratification.  2 dyspnea-not particularly volume overloaded on examination.  Previous echocardiogram showed normal LV function.  We will not pursue further if exercise treadmill negative.  3 hypertension-blood pressure is controlled.  Continue present medications and follow.  Kirk Ruths, MD

## 2018-08-31 ENCOUNTER — Ambulatory Visit (INDEPENDENT_AMBULATORY_CARE_PROVIDER_SITE_OTHER): Payer: 59 | Admitting: Cardiology

## 2018-08-31 ENCOUNTER — Encounter: Payer: Self-pay | Admitting: Cardiology

## 2018-08-31 VITALS — BP 131/84 | HR 90 | Ht 61.5 in | Wt 142.1 lb

## 2018-08-31 DIAGNOSIS — M25521 Pain in right elbow: Secondary | ICD-10-CM | POA: Diagnosis not present

## 2018-08-31 DIAGNOSIS — M7701 Medial epicondylitis, right elbow: Secondary | ICD-10-CM | POA: Diagnosis not present

## 2018-08-31 DIAGNOSIS — R079 Chest pain, unspecified: Secondary | ICD-10-CM | POA: Diagnosis not present

## 2018-08-31 DIAGNOSIS — I10 Essential (primary) hypertension: Secondary | ICD-10-CM | POA: Diagnosis not present

## 2018-08-31 DIAGNOSIS — R0600 Dyspnea, unspecified: Secondary | ICD-10-CM

## 2018-08-31 MED FILL — POTASSIUM CL ER 20 MEQ TAB: 20 | 30 days supply | Qty: 60 | Fill #1

## 2018-08-31 NOTE — Patient Instructions (Signed)
Medication Instructions:  Your physician recommends that you continue on your current medications as directed. Please refer to the Current Medication list given to you today.   If you need a refill on your cardiac medications before your next appointment, please call your pharmacy.   Lab work: None ordered  Testing/Procedures: Your physician has requested that you have an exercise tolerance test. For further information please visit [email or web address]. Please also follow instruction sheet, as given.  [facility] [address] 09/06/18 @ 3:30pm. Please arrive 15 minutes prior  Follow-Up: At [facility], you and your health needs are our priority.  As part of our continuing mission to provide you with exceptional heart care, we have created designated Provider Care Teams.  These Care Teams include your primary Cardiologist (physician) and Advanced Practice Providers (APPs -  Physician Assistants and Nurse Practitioners) who all work together to provide you with the care you need, when you need it.  Your physician recommends that you schedule a follow-up appointment as needed pending results    Any Other Special Instructions Will Be Listed Below (If Applicable).  Exercise Stress Electrocardiogram An exercise stress electrocardiogram is a test to check how blood flows to your heart. It is done to find areas of poor blood flow. You will need to walk on a treadmill for this test. The electrocardiogram will record your heartbeat when you are at rest and when you are exercising. What happens before the procedure?  Do not have drinks with caffeine or foods with caffeine for 24 hours before the test, or as told by your doctor. This includes coffee, tea (even decaf tea), sodas, chocolate, and cocoa.  Follow your doctor's instructions about eating and drinking before the test.  Ask your doctor what medicines you should or should not take before the test. Take your medicines  with water unless told by your doctor not to.  If you use an inhaler, bring it with you to the test.  Bring a snack to eat after the test.  Do not  smoke for 4 hours before the test.  Do not put lotions, powders, creams, or oils on your chest before the test.  Wear comfortable shoes and clothing. What happens during the procedure?  You will have patches put on your chest. Small areas of your chest may need to be shaved. Wires will be connected to the patches.  Your heart rate will be watched while you are resting and while you are exercising.  You will walk on the treadmill. The treadmill will slowly get faster to raise your heart rate.  The test will take about 1-2 hours. What happens after the procedure?  Your heart rate and blood pressure will be watched after the test.  You may return to your normal diet, activities, and medicines or as told by your doctor. This information is not intended to replace advice given to you by your health care provider. Make sure you discuss any questions you have with your health care provider. Document Released: 04/06/2008 Document Revised: 06/17/2016 Document Reviewed: 06/26/2013 Elsevier Interactive Patient Education  [employer].

## 2018-09-02 ENCOUNTER — Telehealth (HOSPITAL_COMMUNITY): Payer: Self-pay

## 2018-09-02 NOTE — Telephone Encounter (Signed)
Encounter complete. 

## 2018-09-06 ENCOUNTER — Ambulatory Visit (HOSPITAL_COMMUNITY)
Admission: RE | Admit: 2018-09-06 | Discharge: 2018-09-06 | Disposition: A | Discharge status: Home / Self Care | Payer: 59 | Source: Ambulatory Visit | Attending: Internal Medicine | Admitting: Internal Medicine

## 2018-09-06 DIAGNOSIS — R079 Chest pain, unspecified: Secondary | ICD-10-CM | POA: Diagnosis not present

## 2018-09-06 LAB — EXERCISE TOLERANCE TEST
Estimated workload: 11.2 METS
Exercise duration (min): 9 min
Exercise duration (sec): 42 s
MPHR: 166 {beats}/min
Peak HR: 193 {beats}/min
Percent HR: 116 %
RPE: 19
Rest HR: 93 {beats}/min

## 2018-09-14 DIAGNOSIS — Z760 Encounter for issue of repeat prescription: Secondary | ICD-10-CM | POA: Diagnosis not present

## 2018-09-20 ENCOUNTER — Other Ambulatory Visit: Payer: Self-pay | Admitting: Family Medicine

## 2018-09-20 ENCOUNTER — Encounter (HOSPITAL_BASED_OUTPATIENT_CLINIC_OR_DEPARTMENT_OTHER): Payer: Self-pay | Admitting: Radiology

## 2018-09-20 DIAGNOSIS — Z1231 Encounter for screening mammogram for malignant neoplasm of breast: Secondary | ICD-10-CM

## 2018-09-21 DIAGNOSIS — H9313 Tinnitus, bilateral: Secondary | ICD-10-CM | POA: Diagnosis not present

## 2018-09-21 DIAGNOSIS — Z7289 Other problems related to lifestyle: Secondary | ICD-10-CM | POA: Diagnosis not present

## 2018-09-21 DIAGNOSIS — M26621 Arthralgia of right temporomandibular joint: Secondary | ICD-10-CM | POA: Diagnosis not present

## 2018-09-21 DIAGNOSIS — H9201 Otalgia, right ear: Secondary | ICD-10-CM | POA: Diagnosis not present

## 2018-09-21 DIAGNOSIS — H9041 Sensorineural hearing loss, unilateral, right ear, with unrestricted hearing on the contralateral side: Secondary | ICD-10-CM | POA: Diagnosis not present

## 2018-09-21 HISTORY — DX: Tinnitus, bilateral: H93.13

## 2018-09-27 ENCOUNTER — Other Ambulatory Visit: Payer: Self-pay | Admitting: Otolaryngology

## 2018-09-27 DIAGNOSIS — H905 Unspecified sensorineural hearing loss: Secondary | ICD-10-CM

## 2018-09-27 DIAGNOSIS — H903 Sensorineural hearing loss, bilateral: Secondary | ICD-10-CM

## 2018-10-03 DIAGNOSIS — H9190 Unspecified hearing loss, unspecified ear: Secondary | ICD-10-CM | POA: Diagnosis not present

## 2018-10-03 MED FILL — POTASSIUM CL ER 20 MEQ TAB: 20 | 30 days supply | Qty: 60 | Fill #2

## 2018-10-03 MED FILL — NUVARING VAGINAL RING: 0.12-0.015 | 84 days supply | Qty: 3 | Fill #3

## 2018-10-05 ENCOUNTER — Ambulatory Visit (INDEPENDENT_AMBULATORY_CARE_PROVIDER_SITE_OTHER): Payer: 59 | Admitting: Neurology

## 2018-10-05 ENCOUNTER — Encounter: Payer: Self-pay | Admitting: Neurology

## 2018-10-05 ENCOUNTER — Ambulatory Visit (INDEPENDENT_AMBULATORY_CARE_PROVIDER_SITE_OTHER): Payer: 59

## 2018-10-05 ENCOUNTER — Encounter

## 2018-10-05 ENCOUNTER — Other Ambulatory Visit: Payer: Self-pay

## 2018-10-05 VITALS — BP 144/86 | HR 88 | Ht 61.0 in | Wt 141.0 lb

## 2018-10-05 DIAGNOSIS — Z1231 Encounter for screening mammogram for malignant neoplasm of breast: Secondary | ICD-10-CM

## 2018-10-05 DIAGNOSIS — R42 Dizziness and giddiness: Secondary | ICD-10-CM

## 2018-10-05 NOTE — Progress Notes (Signed)
NEUROLOGY FOLLOW UP OFFICE NOTE  Nicole Crosby [id number]  HISTORY OF PRESENT ILLNESS: I had the pleasure of seeing Nicole Crosby in follow-up in the neurology clinic on 10/05/2018.  The patient was last seen almost 2 years ago for memory changes and dizziness. Neuropsychological evaluation last December 2017 showed normal cognitive testing.It was felt that dysthymia and psychosocial stress were most likely the cause of subjective cognitive complaints. She presents today for different symptoms that started after a bout of vertigo last February. She woke up one Saturday with pressure in the back of her head. She went about her day, but noticed pressure worsening when she would bend or lean down. She then started having significant vertigo with spinning sensation and feeling off balance, nausea with dry heaving. She could feel her eyes moving. She went to bed and felt better the next day but decided to go to the ER where she was diagnosed with vertigo. While she was in the ER, she started having pain in the right ear and eye. 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 She has been scheduled for an MRI brain with and without contrast next week. She feels that since the episode of vertigo last February, she would have times where she feel like she will fall over when looking down on her phone or looking up. She has noticed when lying down, she would feel a sensation of off balance coming on and have to turn over. She has neck pain. No falls. She denies any diplopia, dysarthria/dysphagia, focal numbness/tingling/weakness. She recalls a week where she was feeling confused and missing turns, she had a normal head CT at that time and symptoms have not recurred.   HPI 08/28/2016: This is a 52 yo RH woman with a history of hypertension, GERD, who presented for headaches, memory loss, and dizziness. She reports the headaches were occurring frequently  when she was taking medications for her reflux (Nexium, Prilosec). With stopping these, the headaches have significantly improved. They only seldom occur. She usually now just takes TUMS or rarely prn Famotidine. She reports that she does not have vertigo, but more of a "loopy sensation" which occurs only when she is sitting down or reading. She would feel like she is on a boat or like someone is trying to push her over. No associated headache, nausea/vomiting, focal symptoms. She would usually take a meclizine which seems to help. They do not seem to occur when she is supine or standing. She was having these symptoms 3-4 times a week in February, but this has improved as well. She had sensations of dysequilibrium when walking around 5 years ago and had vestibular therapy, but was told symptoms were not due to vestibular dysfunction. She has noticed some changes in her hearing but missed her hearing test last April. No tinnitus. Her main concern today is the memory change that started in the Fall of last year, but worse in February 2017. It appears she had reported memory changes in 2015 and had an MRI brain with and without contrast which I personally reviewed, which was normal. She has noticed that her brain is not processing things correctly. She asked to see a neurologist after she forgot about a work appointment last August. She had noticed that when she was taking Prilosec, she was definitely more confused, forgetting coworkers names, her address, using her maiden name when she had been married for 20+ years. This did improved when  she stopped Prilosec, but she continues to notice some changes. Co-workers and her daughter has mentioned as well that she would repeat herself. She denies getting lost driving, no missed bill payments or medications. She denies misplacing things frequently and has not left the stove on. She has occasional difficulties multitasking. She noticed she has to lean back on her car's head  rest to "avoid being loopy." She had numbness and tingling in her hands and feet in February and was found to have a low B12 level. She is now on monthly injections, most recent B12 level in August 2017 was 402. Paresthesias are better. Her mother may have dementia. She denies any significant head injuries, she drinks alcohol occasionally.   PAST MEDICAL HISTORY: Past Medical History:  Diagnosis Date  . Allergy    Buspirone, Sulfa, Pantoprazole  . Benign fundic gland polyps of stomach 02/12/2016  . Chicken pox   . DYSTHYMIA 09/30/2009  . Functional dyspepsia   . GERD (gastroesophageal reflux disease)   . H/O adenomatous polyp of colon 10/04/2014   09/2014 - 3 mm adenoma - repeat colonoscopy 2022  . Hyperlipidemia    no meds  . Hypertension    pt denies  . IBS (irritable bowel syndrome)   . Insomnia   . MEDIAL EPICONDYLITIS, RIGHT 07/17/2010  . 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 Other abnormal glucose    Tiny hypodensity left hepatic lobe-CT of abdomen and pelvis 11/02/7508-[facility] 35mm periumblical hernia  . PAC (premature atrial contraction)   . Palpitations     MEDICATIONS: Current Outpatient Medications on File Prior to Visit  Medication Sig Dispense Refill  . Calcium Carbonate-Vit D-Min (CALCIUM 1200 PO) Take 1 capsule by mouth daily.    . cholecalciferol (VITAMIN D) 1000 UNITS tablet Take 2,000 Units by mouth daily.     Stasia Cavalier (EUCRISA) 2 % OINT Apply 1 application topically 2 (two) times daily. 60 g 1  . diazepam (VALIUM) 5 MG tablet Take 1 tablet (5 mg total) by mouth every 8 (eight) hours as needed (vertigo). 8 tablet 0  . famotidine (PEPCID) 10 MG tablet Take 10 mg by mouth 2 (two) times daily as needed for heartburn or indigestion.    Marland Kitchen losartan-hydrochlorothiazide (HYZAAR) 50-12.5 MG tablet TAKE 1 TABLET BY MOUTH DAILY. 90 tablet 1  . meclizine (ANTIVERT) 25 MG tablet Take 25 mg by mouth 3 (three) times daily as needed for dizziness.    Marland Kitchen NUVARING 0.12-0.015 MG/24HR  vaginal ring INSERT 1 RING VAGINALLY AND LEAVE IN PLACE FOR 3 CONSECUTIVE WEEKS, THEN REMOVE FOR 1 WEEK. 1 each 11  . OSCIMIN 0.125 MG SUBL   5  . potassium chloride SA (K-DUR,KLOR-CON) 20 MEQ tablet TAKE 1 TABLET BY MOUTH TWICE DAILY 60 tablet 6  . vitamin B-12 (CYANOCOBALAMIN) 500 MCG tablet Take 500 mcg by mouth daily.     No current facility-administered medications on file prior to visit.     ALLERGIES: Allergies  Allergen Reactions  . Buspirone Other (See Comments)     nightmare  . Esomeprazole Other (See Comments)    headache  . Lansoprazole     Abdominal pain  . Omeprazole     Headache  . Sulfonamide Derivatives Rash     Rash    FAMILY HISTORY: Family History  Problem Relation Age of Onset  . Hypertension Mother   . Hyperlipidemia Mother   . Fibrocystic breast disease Mother   . Hearing loss Mother   . Osteopenia Mother   . Prostate  cancer Father 67       deceased  . Hypertension Father   . Diabetes Father   . Kidney disease Father   . Heart disease Father        MI  . Hyperlipidemia Father   . Other Brother        unknown  . Thyroid disease Sister   . Diabetes Sister        pre-diabetes  . Hypertension Sister   . Arthritis Sister   . Diabetes Sister   . Hypertension Sister   . Hyperlipidemia Sister   . Fibrocystic breast disease Sister   . Colon cancer Maternal Uncle        died in late 60's  . Diabetes Maternal Grandmother   . Prostate cancer Maternal Grandfather   . COPD Maternal Grandfather   . Hyperlipidemia Sister   . Hypertension Sister   . Rectal cancer Neg Hx   . Stomach cancer Neg Hx   . Esophageal cancer Neg Hx   . Pancreatic cancer Neg Hx     SOCIAL HISTORY: Social History   Socioeconomic History  . Marital status: Married    Spouse name: Not on file  . Number of children: 2  . Years of education: Not on file  . Highest education level: Not on file  Occupational History  . Occupation: Xray/CT Engineer, production: [employer]  . Financial resource strain: Not on file  . Food insecurity:    Worry: Not on file    Inability: Not on file  . Transportation needs:    Medical: Not on file    Non-medical: Not on file  Tobacco Use  . Smoking status: Never Smoker  . Smokeless tobacco: Never Used  Substance and Sexual Activity  . Alcohol use: Yes    Alcohol/week: 1.0 standard drinks    Types: 1 Glasses of wine per week    Comment: occasional  . Drug use: No  . Sexual activity: Yes    Birth control/protection: Inserts  Lifestyle  . Physical activity:    Days per week: Not on file    Minutes per session: Not on file  . Stress: Not on file  Relationships  . Social connections:    Talks on phone: Not on file    Gets together: Not on file    Attends religious service: Not on file    Active member of club or organization: Not on file    Attends meetings of clubs or organizations: Not on file    Relationship status: Not on file  . Intimate partner violence:    Fear of current or ex partner: Not on file    Emotionally abused: Not on file    Physically abused: Not on file    Forced sexual activity: Not on file  Other Topics Concern  . 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    Alcohol Use - yes-wine         Full Time    Married            REVIEW OF SYSTEMS: Constitutional: No fevers, chills, or sweats, no generalized fatigue, change in appetite Eyes: No visual changes, double vision, eye pain Ear, nose and throat: No hearing loss, ear pain, nasal congestion, sore throat Cardiovascular: No chest pain, palpitations Respiratory:  No shortness of breath at rest or with exertion, wheezes  GastrointestinaI: No nausea, vomiting, diarrhea, abdominal pain, fecal incontinence Genitourinary:  No dysuria, urinary retention or frequency Musculoskeletal:  + neck pain, no back pain Integumentary: No rash, pruritus, skin  lesions Neurological: as above Psychiatric: No depression, insomnia, anxiety Endocrine: No palpitations, fatigue, diaphoresis, mood swings, change in appetite, change in weight, increased thirst Hematologic/Lymphatic:  No anemia, purpura, petechiae. Allergic/Immunologic: no itchy/runny eyes, nasal congestion, recent allergic reactions, rashes  PHYSICAL EXAM: Vitals:   10/05/18 0927  BP: (!) 144/86  Pulse: 88  SpO2: 99%   General: No acute distress Head:  Normocephalic/atraumatic Neck: supple, no paraspinal tenderness, full range of motion Heart:  Regular rate and rhythm Lungs:  Clear to auscultation bilaterally Back: No paraspinal tenderness Skin/Extremities: No rash, no edema Neurological Exam: alert and oriented to person, place, and time. No aphasia or dysarthria. Fund of knowledge is appropriate.  Recent and remote memory are intact.  Attention and concentration are normal.    Able to name objects and repeat phrases. Cranial nerves: Pupils equal, round, reactive to light.  Extraocular movements intact with no nystagmus. Visual fields full. Facial sensation intact. No facial asymmetry. Tongue, uvula, palate midline.  Motor: Bulk and tone normal, muscle strength 5/5 throughout with no pronator drift.  Sensation to light touch, cold, pin, vibration sense intact.  No extinction to double simultaneous stimulation.  Deep tendon reflexes +1 throughout, toes downgoing.  Finger to nose and heel to shin testing intact, no dysdiadochokinesia. Gait narrow-based and steady, able to tandem walk adequately.  Romberg negative.  IMPRESSION: This is a 52 yo RH woman with a history of hypertension, B12 deficiency, GERD, seen initially for headaches, dysequilibrium, and memory changes. Her headaches had resolved after stopping reflux medication. Neuropsychological testing showed normal cognition, with cognitive complaints likely due to underlying stress. She presents today with continued vertigo/imbalance  with head movements after a bout of vertigo last February. She was evaluated by ENT with note of asymmetric right sensorineural hearing loss. Her neurological exam is normal. She is scheduled for MRI brain with and without contrast next week. We agreed to refer for vestibular therapy for positional dizziness. She will follow-up in 6-8 months and knows to call for any changes.   Thank you for allowing me to participate in her care.  Please do not hesitate to call for any questions or concerns.  The duration of this appointment visit was 30 minutes of face-to-face time with the patient.  Greater than 50% of this time was spent in counseling, explanation of diagnosis, planning of further management, and coordination of care.   Nicole Crosby, M.D.   CC: Dr. Birdie Crosby

## 2018-10-05 NOTE — Patient Instructions (Signed)
1. Schedule Vestibular therapy 2. Proceed with MRI brain as scheduled 3. Follow-up in 6-8 months, call for any changes

## 2018-10-10 ENCOUNTER — Ambulatory Visit (INDEPENDENT_AMBULATORY_CARE_PROVIDER_SITE_OTHER): Payer: 59

## 2018-10-10 DIAGNOSIS — R51 Headache: Secondary | ICD-10-CM | POA: Diagnosis not present

## 2018-10-10 DIAGNOSIS — H905 Unspecified sensorineural hearing loss: Secondary | ICD-10-CM | POA: Diagnosis not present

## 2018-10-10 DIAGNOSIS — H9191 Unspecified hearing loss, right ear: Secondary | ICD-10-CM | POA: Diagnosis not present

## 2018-10-10 DIAGNOSIS — H903 Sensorineural hearing loss, bilateral: Secondary | ICD-10-CM

## 2018-10-10 MED ORDER — GADOBUTROL 1 MMOL/ML IV SOLN
6.0000 mL | Freq: Once | INTRAVENOUS | Status: AC | PRN
  Administered 2018-10-10: 6 mL via INTRAVENOUS

## 2018-10-13 ENCOUNTER — Ambulatory Visit: Payer: 59 | Attending: Neurology

## 2018-10-13 ENCOUNTER — Other Ambulatory Visit: Payer: Self-pay

## 2018-10-13 DIAGNOSIS — R42 Dizziness and giddiness: Secondary | ICD-10-CM | POA: Insufficient documentation

## 2018-10-13 NOTE — Therapy (Signed)
[facility] [address] [place], [place], [address] Phone: [phone]   Fax:  [phone]  Physical Therapy Evaluation  Patient Details  Name: Nicole Crosby MRN: [id number] Date of Birth: 1966/06/06 Referring Provider (PT): Dr. Delice Lesch   Encounter Date: 10/13/2018  PT End of Session - 10/13/18 0918    Visit Number  1    Number of Visits  1    Date for PT Re-Evaluation  10/13/18    Authorization Type  [facility]    PT Start Time  0803    PT Stop Time  0845    PT Time Calculation (min)  42 min    Equipment Utilized During Treatment  --   min guard to S prn   Activity Tolerance  Patient tolerated treatment well    Behavior During Therapy  [facility] for tasks assessed/performed       Past Medical History:  Diagnosis Date  . Allergy    Buspirone, Sulfa, Pantoprazole  . Benign fundic gland polyps of stomach 02/12/2016  . Chicken pox   . DYSTHYMIA 09/30/2009  . Functional dyspepsia   . GERD (gastroesophageal reflux disease)   . H/O adenomatous polyp of colon 10/04/2014   09/2014 - 3 mm adenoma - repeat colonoscopy 2022  . Hyperlipidemia    no meds  . Hypertension    pt denies  . IBS (irritable bowel syndrome)   . Insomnia   . MEDIAL EPICONDYLITIS, RIGHT 07/17/2010  . 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 Other abnormal glucose    Tiny hypodensity left hepatic lobe-CT of abdomen and pelvis 05/13/1974-OITGP 37mm periumblical hernia  . PAC (premature atrial contraction)   . Palpitations     Past Surgical History:  Procedure Laterality Date  . CESAREAN SECTION  01/2001  . COLONOSCOPY    . DE QUERVAIN'S RELEASE  2000   Right wrist area    There were no vitals filed for this visit.   Subjective Assessment - 10/13/18 0809    Subjective  Pt reported she's had balance issues for years, stopped riding bike last year (afraid of falling off bike). Pt feels like dizziness is improving and more sporadic. Pt woke up last  February with pressure in head and it was worse with bending forward, vertigo started that night. She feels foggy and off balance. She describes dizziness as spinning. Went to ED and was told she had vertigo and given medication. She takes Valium and Meclizine (intermittently but took it last night). Worse with looking down and up and turning in bed, brings on nausea. At worse dizziness is 1/10, and 0/10 at best. On and off for weeks now. 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 Pt reported B tinnitus, pt denied weakness, N/T. Pt does have progressive lens and she feels this could contribute to dizziness.     Patient Stated Goals  To not have this feeling of off balance.     Currently in Pain?  No/denies         [facility] PT Assessment - 10/13/18 0835      Functional Gait  Assessment   Gait assessed   Yes    Gait Level Surface  Walks 20 ft in less than 5.5 sec, no assistive devices, good speed, no evidence for imbalance, normal gait pattern, deviates no more than 6 in outside of the 12 in walkway width.    Change in Gait Speed  Able to smoothly change walking speed without loss of balance or gait deviation. Deviate no more than 6 in outside of the 12 in walkway width.    Gait with Horizontal Head Turns  Performs head turns smoothly with no change in gait. Deviates no more than 6 in outside 12 in walkway width    Gait with Vertical Head Turns  Performs head turns with no change in gait. Deviates no more than 6 in outside 12 in walkway width.    Gait and Pivot Turn  Pivot turns safely within 3 sec and stops quickly with no loss of balance.    Step Over Obstacle  Is able to step over 2 stacked shoe boxes taped together (9 in total height) without changing gait speed. No evidence of imbalance.    Gait with Narrow Base of Support  Is able to ambulate for 10 steps heel to toe with no staggering.    Gait with Eyes Closed  Walks 20 ft, no assistive devices,  good speed, no evidence of imbalance, normal gait pattern, deviates no more than 6 in outside 12 in walkway width. Ambulates 20 ft in less than 7 sec.    Ambulating Backwards  Walks 20 ft, no assistive devices, good speed, no evidence for imbalance, normal gait    Steps  Alternating feet, no rail.    Total Score  30    FGA comment:  30/30: WNL           Vestibular Assessment - 10/13/18 0820      Symptom Behavior   Type of Dizziness  Spinning   and some wooziness    Frequency of Dizziness  Every few weeks    Duration of Dizziness  A few seconds    Aggravating Factors  Rolling to right;Rolling to left;Looking up to the ceiling;Comment   looking down    Relieving Factors  Rest;Slow movements      Occulomotor Exam   Occulomotor Alignment  Normal    Spontaneous  Absent    Gaze-induced  Absent    Smooth Pursuits  Intact    Saccades  Intact    Comment  Unable to check B HIT 2/2 neck pain. Convergence normal.      Vestibulo-Occular Reflex   VOR 1 Head Only (x 1 viewing)  WNL, pt reported neck pain which is chronic per pt    VOR Cancellation  Normal      Positional Testing   Dix-Hallpike  Dix-Hallpike Right;Dix-Hallpike Left    Horizontal Canal Testing  Horizontal Canal Right;Horizontal Canal Left      Dix-Hallpike Right   Dix-Hallpike Right Duration  0    Dix-Hallpike Right Symptoms  No nystagmus      Dix-Hallpike Left   Dix-Hallpike Left Duration  0    Dix-Hallpike Left Symptoms  No nystagmus      Horizontal Canal Right   Horizontal Canal Right Duration  0    Horizontal Canal Right Symptoms  Normal      Horizontal Canal Left   Horizontal Canal Left Duration  0    Horizontal Canal Left Symptoms  Normal      Positional Sensitivities   Up from Right Hallpike  Lightheadedness    Up from Left Hallpike  Lightheadedness          Objective measurements completed on examination: See above findings.              PT Education - 10/13/18 0917    Education  Details  PT discussed exam findings and outcome measures. PT educated pt that Valium and Meclizine and suppress CNS response to testing and treatment. PT discussed that no impairments found today but to inform MD if vertigo returns and have new order sent to PT. PT also educated pt on assessing BP and that BP medication can cause wooziness/lightheadedness with position changes. PT educated pt to f/u with MD re: neck pain.    Person(s) Educated  Patient    Methods  Explanation    Comprehension  Verbalized understanding       PT Short Term Goals - 10/13/18 0923      PT SHORT TERM GOAL #1   Title  one-time eval        PT Long Term Goals - 10/13/18 0923      PT LONG TERM GOAL #1   Title  one-time eval             Plan - 10/13/18 0919    Clinical Impression Statement  Pt is a pleasant 52y/o female presenting to [facility] neuro for vertigo. Pt's PMH signficant for the following: hx of vertigo, HTN, HLD, depression, and PAC. All positional testing was negative for vertigo and nystagmus today. Pt denied dizziness during all vestibular testing and during FGA. PT did not perform B HIT 2/2 c/o chronic neck pain during VOR.  Pt's gait speed was WNL. Pt's FGA score was WNL. Therefore, this will be a one-time eval only. Please send new referral if pt's vertigo returns. Thank you.     History and Personal Factors relevant to plan of care:  Pt works full-time, cares for two children, has good family support    Clinical Presentation  Stable    Clinical Presentation due to:   hx of vertigo, HTN, HLD, depression, and PAC    Clinical Decision Making  Low    Rehab Potential  Excellent    PT Frequency  One time visit    PT Next Visit Plan  one-time eval    Consulted and Agree with Plan of Care  Patient       Patient will benefit from skilled therapeutic intervention in order to improve the following deficits and impairments:  Dizziness  Visit Diagnosis: Dizziness and giddiness - Plan: PT plan of  care cert/re-cert     Problem List Patient Active Problem List   Diagnosis Date Noted  . Flexural eczema 05/17/2018  . Eyelid dermatitis, allergic/contact 05/17/2018  . Medial epicondylitis, right 03/17/2018  . 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 Vitamin D deficiency 11/03/2017  . Shortened PR interval 09/27/2017  . Greater trochanteric bursitis of left hip 05/12/2017  . B12 deficiency 01/22/2017  . Memory changes 08/28/2016  . 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 Benign fundic gland polyps of stomach 02/12/2016  . Paresthesia 12/17/2015  . Bladder spasm 12/13/2014  . H/O adenomatous polyp of colon 10/04/2014  . Fatigue 09/03/2014  . Chest pain 03/14/2014  . Decreased hearing of right ear 02/05/2014  . Other malaise and fatigue 01/11/2014  . Maxillary sinusitis 10/13/2013  . Screening for malignant neoplasm of cervix 05/31/2013  . Physical exam 05/31/2013  . Pelvic pain in female 05/31/2013  . Left L3 lumbar radiculitis 09/23/2012  . Neck pain 08/22/2012  . Gait disturbance 08/16/2012  . Adrenal nodule ([place]) 03/02/2012  . Essential hypertension, benign 03/02/2012  . IBS (irritable bowel syndrome)   . PAC (premature atrial contraction)   . Insomnia   .  GERD (gastroesophageal reflux disease)   . DYSTHYMIA 09/30/2009  . HEADACHE 09/21/2008  . HEPATIC CYST 08/14/2008  . Hyperlipidemia 08/06/2008    Macguire Holsinger L 10/13/2018, 9:24 AM  [facility] [address] [place], [place], [address] Phone: [phone]   Fax:  [phone]  Name: VERENISE MOULIN MRN: [id number] Date of Birth: 12/13/1965   Geoffry Paradise, PT,DPT 10/13/18 9:24 AM Phone: [phone] Fax: [phone]

## 2018-10-20 ENCOUNTER — Encounter: Payer: Self-pay | Admitting: Family Medicine

## 2018-10-20 ENCOUNTER — Other Ambulatory Visit: Payer: Self-pay

## 2018-10-20 ENCOUNTER — Ambulatory Visit (INDEPENDENT_AMBULATORY_CARE_PROVIDER_SITE_OTHER): Payer: 59 | Admitting: Family Medicine

## 2018-10-20 ENCOUNTER — Other Ambulatory Visit (HOSPITAL_COMMUNITY)
Admission: RE | Admit: 2018-10-20 | Discharge: 2018-10-20 | Disposition: A | Discharge status: Home / Self Care | Payer: 59 | Source: Ambulatory Visit | Attending: Family Medicine | Admitting: Family Medicine

## 2018-10-20 VITALS — BP 130/80 | HR 78 | Temp 98.4°F | Resp 16 | Ht 61.0 in | Wt 137.1 lb

## 2018-10-20 DIAGNOSIS — N898 Other specified noninflammatory disorders of vagina: Secondary | ICD-10-CM

## 2018-10-20 DIAGNOSIS — M542 Cervicalgia: Secondary | ICD-10-CM | POA: Diagnosis not present

## 2018-10-20 DIAGNOSIS — R0781 Pleurodynia: Secondary | ICD-10-CM | POA: Diagnosis not present

## 2018-10-20 NOTE — Progress Notes (Signed)
   Subjective:    Patient ID: Nicole Crosby, female    DOB: May 05, 1966, 52 y.o.   MRN: [id number]  HPI Neck pain- pt went to vestibular rehab and was told that she was 'guarding her neck' which was causing her inner ear crystals to be off balance.  Has flexeril at home  Rib pain- R sided.  Occurs 'every few days'.  No known injury.  Some TTP.  sxs started 2-3 months ago.  sxs resolve spontaneously.  Vaginal d/c- sxs started ~2 months ago.  Thin, watery.  No odor.  No itching or burning.  Yellow tinged.  Continues to have periods.   Review of Systems For ROS see HPI     Objective:   Physical Exam Vitals signs reviewed.  Constitutional:      General: She is not in acute distress.    Appearance: Normal appearance. She is not ill-appearing.  HENT:     Head: Normocephalic and atraumatic.  Eyes:     Extraocular Movements: Extraocular movements intact.     Pupils: Pupils are equal, round, and reactive to light.  Neck:     Musculoskeletal: No neck rigidity.     Comments: Bilateral trap spasm Pain w/ flexion and extension of neck Pulmonary:     Effort: Pulmonary effort is normal. No respiratory distress.     Breath sounds: Normal breath sounds. No wheezing or rhonchi.  Chest:     Chest wall: Tenderness (TTP over R rib in mid-axillary line) present.  Genitourinary:    General: Normal vulva.     Vagina: Vaginal discharge (scant watery malodorous d/c consistent w/ BV) present.  Skin:    General: Skin is warm and dry.  Neurological:     Mental Status: She is alert.           Assessment & Plan:  R rib pain- new.  Pt w/ TTP but no obvious bony abnormality.  Will get xray to assess.  Suspect this is muscular.  Will follow.  Vaginal d/c- no evidence of yeast.  Suspect BV but will await labs prior to treating.  Pt expressed understanding and is in agreement w/ plan.

## 2018-10-20 NOTE — Patient Instructions (Signed)
Follow up as needed or as scheduled Get your xray at [facility] for the rib pain We'll call you with your physical therapy appt for your neck pain Use the Flexeril as needed for spasm Use a heating pad for pain relief We'll notify you of your lab results and determine if treatment is needed for vaginal discharge Consider stopping the NuvaRing to see if you are in menopause Call with any questions or concerns Hang in there!!

## 2018-10-21 ENCOUNTER — Ambulatory Visit (INDEPENDENT_AMBULATORY_CARE_PROVIDER_SITE_OTHER): Payer: 59

## 2018-10-21 DIAGNOSIS — R0781 Pleurodynia: Secondary | ICD-10-CM | POA: Diagnosis not present

## 2018-10-23 LAB — CERVICOVAGINAL ANCILLARY ONLY: Wet Prep (BD Affirm): POSITIVE — AB

## 2018-10-24 ENCOUNTER — Encounter: Payer: Self-pay | Admitting: Family Medicine

## 2018-10-24 ENCOUNTER — Other Ambulatory Visit: Payer: Self-pay | Admitting: General Practice

## 2018-10-24 DIAGNOSIS — H9041 Sensorineural hearing loss, unilateral, right ear, with unrestricted hearing on the contralateral side: Secondary | ICD-10-CM | POA: Diagnosis not present

## 2018-10-24 MED ORDER — METRONIDAZOLE 500 MG PO TABS: 500.0000 mg | ORAL_TABLET | Freq: Two times a day (BID) | ORAL | 0 refills | Status: DC

## 2018-10-24 MED FILL — metroNIDAZOLE 500 MG TABS: 500 | 7 days supply | Qty: 14 | Fill #0

## 2018-10-24 MED FILL — HYDROCHLOROTHIAZIDE 12.5 MG: 12.5 | 90 days supply | Qty: 90 | Fill #0

## 2018-10-24 MED FILL — LOSARTAN POTASSIUM 50 MG TA: 50 | 90 days supply | Qty: 90 | Fill #0

## 2018-10-31 DIAGNOSIS — H9041 Sensorineural hearing loss, unilateral, right ear, with unrestricted hearing on the contralateral side: Secondary | ICD-10-CM | POA: Diagnosis not present

## 2018-11-01 ENCOUNTER — Encounter: Payer: Self-pay | Admitting: Family Medicine

## 2018-11-07 MED FILL — POTASSIUM CHLORIDE CRYS ER: 20 | 30 days supply | Qty: 60 | Fill #3

## 2018-11-14 ENCOUNTER — Encounter: Payer: Self-pay | Admitting: Family Medicine

## 2018-11-17 ENCOUNTER — Ambulatory Visit (INDEPENDENT_AMBULATORY_CARE_PROVIDER_SITE_OTHER): Payer: 59 | Admitting: Family Medicine

## 2018-11-17 ENCOUNTER — Other Ambulatory Visit: Payer: Self-pay

## 2018-11-17 ENCOUNTER — Encounter: Payer: Self-pay | Admitting: Family Medicine

## 2018-11-17 ENCOUNTER — Encounter: Payer: Self-pay | Admitting: General Practice

## 2018-11-17 ENCOUNTER — Other Ambulatory Visit (HOSPITAL_COMMUNITY)
Admission: RE | Admit: 2018-11-17 | Discharge: 2018-11-17 | Disposition: A | Discharge status: Home / Self Care | Payer: 59 | Source: Ambulatory Visit | Attending: Family Medicine | Admitting: Family Medicine

## 2018-11-17 VITALS — BP 122/78 | HR 85 | Temp 98.3°F | Resp 16 | Ht 61.0 in | Wt 138.1 lb

## 2018-11-17 DIAGNOSIS — I10 Essential (primary) hypertension: Secondary | ICD-10-CM | POA: Diagnosis not present

## 2018-11-17 DIAGNOSIS — Z0000 Encounter for general adult medical examination without abnormal findings: Secondary | ICD-10-CM

## 2018-11-17 DIAGNOSIS — E559 Vitamin D deficiency, unspecified: Secondary | ICD-10-CM | POA: Diagnosis not present

## 2018-11-17 DIAGNOSIS — Z124 Encounter for screening for malignant neoplasm of cervix: Secondary | ICD-10-CM | POA: Insufficient documentation

## 2018-11-17 LAB — CBC WITH DIFFERENTIAL/PLATELET
Basophils Absolute: 0.1 10*3/uL (ref 0.0–0.1)
Basophils Relative: 0.7 % (ref 0.0–3.0)
Eosinophils Absolute: 0.1 10*3/uL (ref 0.0–0.7)
Eosinophils Relative: 1.6 % (ref 0.0–5.0)
HCT: 43.4 % (ref 36.0–46.0)
Hemoglobin: 14.5 g/dL (ref 12.0–15.0)
Lymphocytes Relative: 29.7 % (ref 12.0–46.0)
Lymphs Abs: 2.7 10*3/uL (ref 0.7–4.0)
MCHC: 33.5 g/dL (ref 30.0–36.0)
MCV: 89.7 fl (ref 78.0–100.0)
Monocytes Absolute: 0.4 10*3/uL (ref 0.1–1.0)
Monocytes Relative: 4.8 % (ref 3.0–12.0)
Neutro Abs: 5.8 10*3/uL (ref 1.4–7.7)
Neutrophils Relative %: 63.2 % (ref 43.0–77.0)
Platelets: 300 10*3/uL (ref 150.0–400.0)
RBC: 4.84 Mil/uL (ref 3.87–5.11)
RDW: 12.8 % (ref 11.5–15.5)
WBC: 9.2 10*3/uL (ref 4.0–10.5)

## 2018-11-17 LAB — LIPID PANEL
Cholesterol: 211 mg/dL — ABNORMAL HIGH (ref 0–200)
HDL: 58.2 mg/dL (ref >39.00)
LDL Cholesterol: 125 mg/dL — ABNORMAL HIGH (ref 0–99)
NonHDL: 153.27
Total CHOL/HDL Ratio: 4
Triglycerides: 142 mg/dL (ref 0.0–149.0)
VLDL: 28.4 mg/dL (ref 0.0–40.0)

## 2018-11-17 LAB — HEPATIC FUNCTION PANEL
ALT: 19 U/L (ref 0–35)
AST: 18 U/L (ref 0–37)
Albumin: 4 g/dL (ref 3.5–5.2)
Alkaline Phosphatase: 58 U/L (ref 39–117)
Bilirubin, Direct: 0.1 mg/dL (ref 0.0–0.3)
Total Bilirubin: 0.5 mg/dL (ref 0.2–1.2)
Total Protein: 7.1 g/dL (ref 6.0–8.3)

## 2018-11-17 LAB — BASIC METABOLIC PANEL
BUN: 10 mg/dL (ref 6–23)
CO2: 32 mEq/L (ref 19–32)
Calcium: 10.4 mg/dL (ref 8.4–10.5)
Chloride: 97 mEq/L (ref 96–112)
Creatinine, Ser: 0.7 mg/dL (ref 0.40–1.20)
GFR: 92.37 mL/min (ref >60.00)
Glucose, Bld: 78 mg/dL (ref 70–99)
Potassium: 3.7 mEq/L (ref 3.5–5.1)
Sodium: 137 mEq/L (ref 135–145)

## 2018-11-17 LAB — VITAMIN D 25 HYDROXY (VIT D DEFICIENCY, FRACTURES): VITD: 40.7 ng/mL (ref 30.00–100.00)

## 2018-11-17 LAB — TSH: TSH: 1.07 u[IU]/mL (ref 0.35–4.50)

## 2018-11-17 NOTE — Assessment & Plan Note (Signed)
Chronic problem, well controlled.  Asymptomatic.  Check labs.  No anticipated med changes.  Will follow. 

## 2018-11-17 NOTE — Assessment & Plan Note (Signed)
Pt's PE WNL.  UTD on mammo, colonoscopy, immunizations.  Pap done today.  Check labs.  Anticipatory guidance provided.

## 2018-11-17 NOTE — Patient Instructions (Signed)
Follow up in 6 months to recheck BP We'll notify you of your lab results and make any changes if needed Keep up the good work on healthy diet and regular exercise- you look great! Call with any questions or concerns Happy New Year!!

## 2018-11-17 NOTE — Assessment & Plan Note (Signed)
Check labs and replete prn. 

## 2018-11-17 NOTE — Assessment & Plan Note (Signed)
Pap collected. 

## 2018-11-17 NOTE — Progress Notes (Signed)
   Subjective:    Patient ID: SKYLARR LIZ, female    DOB: [birth year], 53 y.o.   MRN: [id number]  HPI CPE- Due for pap today.  UTD on flu, Tdap, mammo, colonoscopy.  Due for pap today.  Review of Systems Patient reports no vision/ hearing changes, adenopathy,fever, weight change,  persistant/recurrent hoarseness , swallowing issues, chest pain, palpitations, edema, persistant/recurrent cough, hemoptysis, dyspnea (rest/exertional/paroxysmal nocturnal), gastrointestinal bleeding (melena, rectal bleeding), abdominal pain, significant heartburn, bowel changes, GU symptoms (dysuria, hematuria, incontinence), Gyn symptoms (abnormal  bleeding, pain),  syncope, focal weakness, memory loss, numbness & tingling, skin/hair/nail changes, abnormal bruising or bleeding, anxiety, or depression.     Objective:   Physical Exam  General Appearance:    Alert, cooperative, no distress, appears stated age  Head:    Normocephalic, without obvious abnormality, atraumatic  Eyes:    PERRL, conjunctiva/corneas clear, EOM's intact, fundi    benign, both eyes  Ears:    Normal TM's and external ear canals, both ears  Nose:   Nares normal, septum midline, mucosa normal, no drainage    or sinus tenderness  Throat:   Lips, mucosa, and tongue normal; teeth and gums normal  Neck:   Supple, symmetrical, trachea midline, no adenopathy;    Thyroid: no enlargement/tenderness/nodules  Back:     Symmetric, no curvature, ROM normal, no CVA tenderness  Lungs:     Clear to auscultation bilaterally, respirations unlabored  Chest Wall:    No tenderness or deformity   Heart:    Regular rate and rhythm, S1 and S2 normal, no murmur, rub   or gallop  Breast Exam:    No tenderness, masses, or nipple abnormality  Abdomen:     Soft, non-tender, bowel sounds active all four quadrants,    no masses, no organomegaly  Genitalia:    External genitalia normal, cervix with epithelial (nabothian) cysts around os, no CMT, uterus in normal  size and position, adnexa w/out mass or tenderness, mucosa pink and moist, no lesions or discharge present  Rectal:    Normal external appearance  Extremities:   Extremities normal, atraumatic, no cyanosis or edema  Pulses:   2+ and symmetric all extremities  Skin:   Skin color, texture, turgor normal, no rashes or lesions  Lymph nodes:   Cervical, supraclavicular, and axillary nodes normal  Neurologic:   CNII-XII intact, normal strength, sensation and reflexes    throughout          Assessment & Plan:

## 2018-11-18 LAB — CYTOLOGY - PAP
Diagnosis: NEGATIVE
HPV: NOT DETECTED

## 2018-11-28 NOTE — Assessment & Plan Note (Signed)
Recurrent problem for pt.  Suspect this is due to trap spasm.  She has Flexeril available to use.  Will refer to PT.  Reviewed supportive care and red flags that should prompt return.  Pt expressed understanding and is in agreement w/ plan.

## 2018-12-12 MED FILL — POTASSIUM CHLORIDE CRYS ER: 20 | 30 days supply | Qty: 60 | Fill #4

## 2019-01-12 ENCOUNTER — Telehealth: Payer: Self-pay

## 2019-01-12 NOTE — Telephone Encounter (Signed)
Copied from [place] [id number]. Topic: Complaint - Billing/Coding >> Dec 07, 2018  8:44 AM Scherrie Gerlach wrote: DOS: 11/17/2018 Details of complaint: pt states she received a bill for her annual wellness with Dr Birdie Riddle.  This happened last year and pt states it took 6 months to straighten out.  Pt states it had to be changed by the office manager.  Pt states the other dx codes added to her visit should not be there.  Pt states she spoke with her insurance company, and this was not coded as a annual well visit.  How would the patient like to see this issue resolved? Change the coding to reflect no charge due. Pt states her cpe is no charge.   Will automatically be routed to [facility] pool. >> Dec 07, 2018  1:21 PM Jerene Dilling H wrote: Sent to [facility] >> Dec 07, 2018  6:05 PM Drema Dallas, Amy H wrote:  Looking at account, we have not received anything from the insurance at this time.  And it is coded correctly as far as I can see.  The primary dx is the well code, also looking back on the previous well exam it was paid correctly.     Dawn Herrington >> Dec 19, 2018  1:04 PM Leonides Schanz, Jacinto Reap wrote: Pt called to check to see if the coding was changed so that the billing for labs for the physical are corrected and there is no charge. Pt requests call back. Cb# [phone] >> Jan 11, 2019  4:39 PM Keene Breath wrote: Patient is call again to have her bill straightened out.  She stated that no one has called her since the first time that she called in February.  Please advise and call patient back at [phone]

## 2019-01-13 NOTE — Telephone Encounter (Signed)
Dawn -   Are her labs correct?

## 2019-01-13 NOTE — Telephone Encounter (Signed)
Hey,  I sent for charge correction for the labs I could, for the other labs, she has conditions that warrant that testing and is not considered a screening.    Hope this help. I cc'd you on email.  Dawn

## 2019-01-16 MED FILL — POTASSIUM CHLORIDE CRYS ER: 20 | 30 days supply | Qty: 60 | Fill #5

## 2019-01-30 ENCOUNTER — Other Ambulatory Visit: Payer: Self-pay | Admitting: Family Medicine

## 2019-01-30 MED FILL — LOSARTAN-HCTZ 50-12.5 MG TA: 50-12.5 | 90 days supply | Qty: 90 | Fill #0

## 2019-02-27 MED FILL — POTASSIUM CHLORIDE CRYS ER: 20 | 30 days supply | Qty: 60 | Fill #6

## 2019-04-03 ENCOUNTER — Other Ambulatory Visit: Payer: Self-pay | Admitting: Family Medicine

## 2019-04-03 MED FILL — POTASSIUM CHLORIDE CRYS ER: 20 | 30 days supply | Qty: 60 | Fill #0

## 2019-05-05 MED FILL — LOSARTAN-HCTZ 50-12.5 MG TA: 50-12.5 | 90 days supply | Qty: 90 | Fill #1

## 2019-05-05 MED FILL — POTASSIUM CHLORIDE CRYS ER: 20 | 30 days supply | Qty: 60 | Fill #1

## 2019-05-16 ENCOUNTER — Ambulatory Visit: Payer: 59 | Admitting: Neurology

## 2019-05-16 ENCOUNTER — Other Ambulatory Visit: Payer: Self-pay

## 2019-05-16 ENCOUNTER — Encounter: Payer: Self-pay | Admitting: Neurology

## 2019-05-16 ENCOUNTER — Telehealth (INDEPENDENT_AMBULATORY_CARE_PROVIDER_SITE_OTHER): Payer: 59 | Admitting: Neurology

## 2019-05-16 VITALS — Ht 61.0 in | Wt 132.1 lb

## 2019-05-16 DIAGNOSIS — M542 Cervicalgia: Secondary | ICD-10-CM | POA: Diagnosis not present

## 2019-05-16 DIAGNOSIS — R42 Dizziness and giddiness: Secondary | ICD-10-CM

## 2019-05-16 MED ORDER — TIZANIDINE HCL 4 MG PO TABS: ORAL_TABLET | ORAL | 5 refills | Status: DC

## 2019-05-16 MED FILL — tiZANidine HCL 4 MG TABS: 4 | 30 days supply | Qty: 30 | Fill #0

## 2019-05-16 NOTE — Progress Notes (Signed)
Virtual Visit via Video Note The purpose of this virtual visit is to provide medical care while limiting exposure to the novel coronavirus.    Consent was obtained for video visit:  Yes.   Answered questions that patient had about telehealth interaction:  Yes.   I discussed the limitations, risks, security and privacy concerns of performing an evaluation and management service by telemedicine. I also discussed with the patient that there may be a patient responsible charge related to this service. The patient expressed understanding and agreed to proceed.  Pt location: Home Physician Location: office Name of referring provider:  Midge Minium, MD I connected with Nicole Crosby at patients initiation/request on 05/16/2019 at  8:30 AM EDT by video enabled telemedicine application and verified that I am speaking with the correct person using two identifiers. Pt MRN:  [id number] Pt DOB:  [birth year] Video Participants:  Nicole Crosby   History of Present Illness:  The patient was seen as a virtual video visit on 05/16/2019. She was last seen 7 months ago for vertigo and pressure in the back of her head. She felt a spinning sensation with her eyes moving, felt off balance, with nausea and dry heaving. She had an MRI brain with and without contrast ordered by ENT which I personally reviewed, no acute changes, brain normal. She went for vestibular therapy but was unable to have Epley maneuver done due to neck pain. She reports the dizziness is not as much as before, mostly occurring when she is supine on her cellphone, she feels like she is falling. She shifts position and the symptoms resolve. She thinks the problem is in her neck, she starts having neck pain at the end of the day and takes Flexeril, which causes her to have a headache the next day. She has neck pain when turning her head to the right, such as when changing lanes. One time she was brushing her teeth and turned her head to the  right then felt like she would pass out, she had nausea and felt like she would fall over. She felt similarly when looking up at the pantry cabinet. Symptoms resolved when she changed positions. She has occasional tingling in her arms, L>R. 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 No vision changes. She has chronic left hip pain.   There is an MRI cervical spine from 2013 reporting mild cervical spondylosis and tiny central disc protrusion at C6-7 without resulting stenosis.    I had the pleasure of seeing Nicole Crosby in follow-up in the neurology clinic on 10/05/2018.  The patient was last seen almost 2 years ago for memory changes and dizziness. Neuropsychological evaluation last December 2017 showed normal cognitive testing.It was felt that dysthymia and psychosocial stress were most likely the cause of subjective cognitive complaints. She presents today for different symptoms that started after a bout of vertigo last February. She woke up one Saturday with pressure in the back of her head. She went about her day, but noticed pressure worsening when she would bend or lean down. She then started having significant vertigo with spinning sensation and feeling off balance, nausea with dry heaving. She could feel her eyes moving. She went to bed and felt better the next day but decided to go to the ER where she was diagnosed with vertigo. While she was in the ER, she started having pain in  the right ear and eye. 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 She has been scheduled for an MRI brain with and without contrast next week. She feels that since the episode of vertigo last February, she would have times where she feel like she will fall over when looking down on her phone or looking up. She has noticed when lying down, she would feel a sensation of off  balance coming on and have to turn over. She has neck pain. No falls. She denies any diplopia, dysarthria/dysphagia, focal numbness/tingling/weakness. She recalls a week where she was feeling confused and missing turns, she had a normal head CT at that time and symptoms have not recurred.   HPI 08/28/2016: This is a 53 yo RH woman with a history of hypertension, GERD, who presented for headaches, memory loss, and dizziness. She reports the headaches were occurring frequently when she was taking medications for her reflux (Nexium, Prilosec). With stopping these, the headaches have significantly improved. They only seldom occur. She usually now just takes TUMS or rarely prn Famotidine. She reports that she does not have vertigo, but more of a "loopy sensation" which occurs only when she is sitting down or reading. She would feel like she is on a boat or like someone is trying to push her over. No associated headache, nausea/vomiting, focal symptoms. She would usually take a meclizine which seems to help. They do not seem to occur when she is supine or standing. She was having these symptoms 3-4 times a week in February, but this has improved as well. She had sensations of dysequilibrium when walking around 5 years ago and had vestibular therapy, but was told symptoms were not due to vestibular dysfunction. She has noticed some changes in her hearing but missed her hearing test last April. No tinnitus. Her main concern today is the memory change that started in the Fall of last year, but worse in February 2017. It appears she had reported memory changes in 2015 and had an MRI brain with and without contrast which I personally reviewed, which was normal. She has noticed that her brain is not processing things correctly. She asked to see a neurologist after she forgot about a work appointment last August. She had noticed that when she was taking Prilosec, she was definitely more confused, forgetting coworkers names,  her address, using her maiden name when she had been married for 20+ years. This did improved when she stopped Prilosec, but she continues to notice some changes. Co-workers and her daughter has mentioned as well that she would repeat herself. She denies getting lost driving, no missed bill payments or medications. She denies misplacing things frequently and has not left the stove on. She has occasional difficulties multitasking. She noticed she has to lean back on her car's head rest to "avoid being loopy." She had numbness and tingling in her hands and feet in February and was found to have a low B12 level. She is now on monthly injections, most recent B12 level in August 2017 was 402. Paresthesias are better. Her mother may have dementia. She denies any significant head injuries, she drinks alcohol occasionally.      Current Outpatient Medications on File Prior to Visit  Medication Sig Dispense Refill   Calcium Carbonate-Vit D-Min (CALCIUM 1200 PO) Take 1,200 Units by mouth daily.     cholecalciferol (VITAMIN D) 1000 UNITS tablet Take 1,000 Units by mouth daily.  Crisaborole (EUCRISA) 2 % OINT Apply 1 application topically 2 (two) times daily. 60 g 1   diazepam (VALIUM) 5 MG tablet Take 1 tablet (5 mg total) by mouth every 8 (eight) hours as needed (vertigo). 8 tablet 0   losartan-hydrochlorothiazide (HYZAAR) 50-12.5 MG tablet TAKE 1 TABLET BY MOUTH DAILY. 90 tablet 1   meclizine (ANTIVERT) 25 MG tablet Take 25 mg by mouth 2 (two) times daily as needed for dizziness.      OSCIMIN 0.125 MG SUBL   5   potassium chloride SA (K-DUR) 20 MEQ tablet TAKE 1 TABLET BY MOUTH TWICE DAILY 60 tablet 6   vitamin B-12 (CYANOCOBALAMIN) 500 MCG tablet Take 500 mcg by mouth daily.     No current facility-administered medications on file prior to visit.      Observations/Objective:   Vitals:   05/16/19 0757  Weight: 132 lb 2 oz (59.9 kg)  Height: 5\' 1"  (1.549 m)   GEN:  The patient appears  stated age and is in NAD. Denies any tenderness on neck palpation.  Neurological examination: Patient is awake, alert, oriented x 3. No aphasia or dysarthria. Intact fluency and comprehension. Remote and recent memory intact. Able to name and repeat. Cranial nerves: Extraocular movements intact with no nystagmus. No facial asymmetry. Motor: moves all extremities symmetrically, at least anti-gravity x 4. No incoordination on finger to nose testing. Gait: narrow-based and steady, able to tandem walk adequately. Negative Romberg test.  Assessment and Plan:   This is a 53 yo RH woman with a history of hypertension, B12 deficiency, GERD, seen initially for headaches, dysequilibrium, and memory changes. Her headaches had resolved after stopping reflux medication. Neuropsychological testing showed normal cognition, with cognitive complaints likely due to underlying stress. SHe was reporting vertigo with head movements on last visit but could not do vestibular therapy due to neck pain. She reports neck pain with head movements to the right, sometimes causing her to feel presyncopal. Neck PT will be ordered at [facility], and if no improvement in symptoms, we will proceed with imaging studies. She will try Tizanidine 4mg  qhs prn for muscle spasms, side effects discussed. She will follow-up in 6 months and knows to call for any changes.    Follow Up Instructions:    -I discussed the assessment and treatment plan with the patient. The patient was provided an opportunity to ask questions and all were answered. The patient agreed with the plan and demonstrated an understanding of the instructions.   The patient was advised to call back or seek an in-person evaluation if the symptoms worsen or if the condition fails to improve as anticipated.    Cameron Sprang, MD

## 2019-05-18 ENCOUNTER — Other Ambulatory Visit: Payer: Self-pay

## 2019-05-18 ENCOUNTER — Encounter: Payer: Self-pay | Admitting: Physical Therapy

## 2019-05-18 ENCOUNTER — Ambulatory Visit: Payer: 59 | Attending: Neurology | Admitting: Physical Therapy

## 2019-05-18 DIAGNOSIS — M542 Cervicalgia: Secondary | ICD-10-CM | POA: Insufficient documentation

## 2019-05-18 DIAGNOSIS — R293 Abnormal posture: Secondary | ICD-10-CM | POA: Insufficient documentation

## 2019-05-18 DIAGNOSIS — M6281 Muscle weakness (generalized): Secondary | ICD-10-CM | POA: Diagnosis not present

## 2019-05-18 NOTE — Therapy (Signed)
[facility] [address]  [place], [place], [address] Phone: [phone]   Fax:  [phone]  Physical Therapy Evaluation  Patient Details  Name: Nicole Crosby MRN: [id number] Date of Birth: 1966/11/02 Referring Provider (PT): Ellouise Newer, MD   Encounter Date: 05/18/2019  PT End of Session - 05/18/19 1749    Visit Number  1    Number of Visits  7    Date for PT Re-Evaluation  06/29/19    Authorization Type  [facility]    PT Start Time  1656    PT Stop Time  1738    PT Time Calculation (min)  42 min    Activity Tolerance  Patient tolerated treatment well;Patient limited by pain    Behavior During Therapy  [facility] for tasks assessed/performed       Past Medical History:  Diagnosis Date  . Allergy    Buspirone, Sulfa, Pantoprazole  . Benign fundic gland polyps of stomach 02/12/2016  . Chicken pox   . DYSTHYMIA 09/30/2009  . Functional dyspepsia   . GERD (gastroesophageal reflux disease)   . H/O adenomatous polyp of colon 10/04/2014   09/2014 - 3 mm adenoma - repeat colonoscopy 2022  . Hyperlipidemia    no meds  . Hypertension    pt denies  . IBS (irritable bowel syndrome)   . Insomnia   . MEDIAL EPICONDYLITIS, RIGHT 07/17/2010  . 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 Other abnormal glucose    Tiny hypodensity left hepatic lobe-CT of abdomen and pelvis 9/75/8832-[id number] 1mm periumblical hernia  . PAC (premature atrial contraction)   . Palpitations     Past Surgical History:  Procedure Laterality Date  . CESAREAN SECTION  01/2001  . COLONOSCOPY    . DE QUERVAIN'S RELEASE  2000   Right wrist area    There were no vitals filed for this visit.   Subjective Assessment - 05/18/19 1657    Subjective  atient reports neck pain starting 1.5 years ago in February when she was in the ER for vertigo. Was sitting in the stretcher for 2 hours without moving her head. Before she left the hospital she started feeling pain at the  base of her skull. Pain worse turning to the R and looking up, so she avoids these movements. Pain is so intense that it makes her feel that she is going to pass out. Patient also reports hx of TMJ pain without known cause. Has not had episode of vertigo since February  but does have sensation of moving when scrolling on her phone. Also has hearing loss in R ear worsening since 10 years ago. Pain located over B base of skull and to either posterolateral side of neck. No radiation of pain but does report intermittent L medial arm tingling down to pinky.    Pertinent History  PAC, R medial epicondylitis, HTN, HLD, GERD, R de quervain's release    Limitations  Lifting    Diagnostic tests  none recently    Patient Stated Goals  get clearance to get neck MRI    Currently in Pain?  Yes    Pain Score  0-No pain    Pain Location  Neck    Pain Orientation  Right;Left;Posterior    Pain Descriptors / Indicators  Aching    Pain Type  Chronic pain         [facility] PT Assessment - 05/18/19 1709      Assessment   Medical  Diagnosis  Neck pain    Referring Provider (PT)  Ellouise Newer, MD    Onset Date/Surgical Date  12/03/17    Hand Dominance  Right    Next MD Visit  not scheduled    Prior Therapy  yes      Precautions   Precautions  None      Balance Screen   Has the patient fallen in the past 6 months  No    Has the patient had a decrease in activity level because of a fear of falling?   No    Is the patient reluctant to leave their home because of a fear of falling?   No      Home Social worker  Private residence    Living Arrangements  Spouse/significant other;Children    Available Help at Discharge  Family    Type of [place]      Prior Function   Level of Independence  Independent    Vocation  Full time employment    Vocation Requirements  CT and xray tech    Leisure  riding a bike      Cognition   Overall Cognitive Status  Within Functional Limits for tasks  assessed      Observation/Other Assessments   Focus on Therapeutic Outcomes (FOTO)   Neck: 56 (44% limited, 34% predicted)      Sensation   Light Touch  Appears Intact      Coordination   Gross Motor Movements are Fluid and Coordinated  Yes      Posture/Postural Control   Posture/Postural Control  Postural limitations    Postural Limitations  Rounded Shoulders;Forward head   increased cervical lordosis, very rounded shoulders     ROM / Strength   AROM / PROM / Strength  AROM;Strength      AROM   AROM Assessment Site  Cervical    Cervical Flexion  55    Cervical Extension  40   reliance on capital flexion; mild pain on return   Cervical - Right Side Bend  20   audible crepitus   Cervical - Left Side Bend  32   audible crepitus   Cervical - Right Rotation  57   mild pain in R c-spine & c/o crepitus   Cervical - Left Rotation  66      Strength   Strength Assessment Site  Shoulder    Right/Left Shoulder  Right;Left    Right Shoulder Flexion  4/5    Right Shoulder ABduction  4/5    Right Shoulder Internal Rotation  4/5    Right Shoulder External Rotation  4/5    Left Shoulder Flexion  4+/5    Left Shoulder ABduction  4+/5    Left Shoulder Internal Rotation  4/5    Left Shoulder External Rotation  4/5      Palpation   Spinal mobility  C3-6 TTP and hypomobile with central PAs    Palpation comment  increased tone in B UT, scalenes, and SCM                Objective measurements completed on examination: See above findings.              PT Education - 05/18/19 1749    Education Details  prognosis, POC, HEP    Person(s) Educated  Patient    Methods  Explanation;Demonstration;Tactile cues;Verbal cues;Handout    Comprehension  Verbalized understanding;Returned demonstration  PT Short Term Goals - 05/18/19 1756      PT SHORT TERM GOAL #1   Title  Patient to be independent with advanced HEP.    Time  3    Period  Weeks    Status  New     Target Date  06/08/19        PT Long Term Goals - 05/18/19 1756      PT LONG TERM GOAL #1   Title  Patient to be independent with advanced HEP.    Time  6    Period  Weeks    Status  New    Target Date  06/29/19      PT LONG TERM GOAL #2   Title  Patient to demonstrate >=4+/5 strength in B shoulders.    Time  6    Period  Weeks    Status  New    Target Date  06/29/19      PT LONG TERM GOAL #3   Title  Patient to demonstrate [facility] cervical AROM without pain limiting.    Time  6    Period  Weeks    Status  New    Target Date  06/29/19      PT LONG TERM GOAL #4   Title  Patient to report 75% improvement in ability to tolerate checking blind spots while driving.    Time  6    Period  Weeks    Status  New    Target Date  06/29/19      PT LONG TERM GOAL #5   Title  Patient to report 0/10 pain with reaching 5lbs weight off of overhead cabinet.    Time  6    Period  Weeks    Status  New    Target Date  06/29/19             Plan - 05/18/19 1750    Clinical Impression Statement  Patient is a 53y/o F presenting to OPPT with c/o chronic neck pain of 1.5 years duration. Pain is located over B base of skull and to either posterolateral side of neck. Reports intermittent L medial arm tingling down to pinky. Pain is worse with cervical R rotation and extension. Patient today with limited and painful cervical AROM, decreased B shoulder strength, abnormal posture, tenderness and hypomobility with central PAs over C3-6, and increased tone in B UT, scalenes, and SCM. Educated patient on gentle postural correction and ROM HEP- patient reported understanding. Would benefit from skilled PT services 1x/week for 6 weeks to address aforementioned impairments.    Personal Factors and Comorbidities  Age;Sex;Comorbidity 3+;Fitness;Past/Current Experience;Profession;Time since onset of injury/illness/exacerbation    Comorbidities  PAC, R medial epicondylitis, HTN, HLD, GERD, R de quervain's  release    Examination-Activity Limitations  Sleep;Caring for Others;Carry;Dressing;Hygiene/Grooming;Lift;Reach Overhead    Examination-Participation Restrictions  Cleaning;Shop;Community Activity;Driving;Yard Work;Interpersonal Relationship;Laundry;Meal Prep    Stability/Clinical Decision Making  Evolving/Moderate complexity    Clinical Decision Making  Moderate    Rehab Potential  Good    PT Frequency  1x / week    PT Duration  6 weeks    PT Treatment/Interventions  ADLs/Self Care Home Management;Cryotherapy;Electrical Stimulation;Moist Heat;Traction;Therapeutic exercise;Therapeutic activities;Functional mobility training;Ultrasound;Neuromuscular re-education;Patient/family education;Manual techniques;Taping;Energy conservation;Dry needling;Passive range of motion;Scar mobilization    PT Next Visit Plan  reassess HEP    Consulted and Agree with Plan of Care  Patient       Patient will benefit from skilled therapeutic intervention in order  to improve the following deficits and impairments:  Hypomobility, Decreased activity tolerance, Decreased strength, Pain, Increased fascial restricitons, Increased muscle spasms, Decreased range of motion, Improper body mechanics, Impaired flexibility, Postural dysfunction  Visit Diagnosis: 1. Cervicalgia   2. Abnormal posture   3. Muscle weakness (generalized)        Problem List Patient Active Problem List   Diagnosis Date Noted  . Flexural eczema 05/17/2018  . Eyelid dermatitis, allergic/contact 05/17/2018  . Medial epicondylitis, right 03/17/2018  . 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 Vitamin D deficiency 11/03/2017  . Shortened PR interval 09/27/2017  . Greater trochanteric bursitis of left hip 05/12/2017  . B12 deficiency 01/22/2017  . Memory changes 08/28/2016  . 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 Benign fundic gland polyps of stomach 02/12/2016  . Paresthesia 12/17/2015  . Bladder spasm 12/13/2014  . H/O adenomatous  polyp of colon 10/04/2014  . Screening for malignant neoplasm of cervix 05/31/2013  . Physical exam 05/31/2013  . Left L3 lumbar radiculitis 09/23/2012  . Neck pain 08/22/2012  . Adrenal nodule ([place]) 03/02/2012  . Essential hypertension, benign 03/02/2012  . IBS (irritable bowel syndrome)   . PAC (premature atrial contraction)   . Insomnia   . GERD (gastroesophageal reflux disease)   . DYSTHYMIA 09/30/2009  . HEADACHE 09/21/2008  . HEPATIC CYST 08/14/2008  . Hyperlipidemia 08/06/2008     Janene Harvey, PT, DPT 05/18/19 6:01 PM   [facility] [address]  [place], [place], [address] Phone: [phone]   Fax:  [phone]  Name: Nicole Crosby MRN: [id number] Date of Birth: 1966-04-09

## 2019-05-23 ENCOUNTER — Ambulatory Visit: Payer: 59

## 2019-05-25 ENCOUNTER — Ambulatory Visit: Payer: 59 | Admitting: Family Medicine

## 2019-05-30 ENCOUNTER — Encounter: Payer: Self-pay | Admitting: Physical Therapy

## 2019-05-30 ENCOUNTER — Ambulatory Visit: Payer: 59 | Admitting: Physical Therapy

## 2019-05-30 ENCOUNTER — Other Ambulatory Visit: Payer: Self-pay

## 2019-05-30 DIAGNOSIS — R293 Abnormal posture: Secondary | ICD-10-CM

## 2019-05-30 DIAGNOSIS — M542 Cervicalgia: Secondary | ICD-10-CM | POA: Diagnosis not present

## 2019-05-30 DIAGNOSIS — M6281 Muscle weakness (generalized): Secondary | ICD-10-CM | POA: Diagnosis not present

## 2019-05-30 NOTE — Therapy (Signed)
[facility] [address]  [place], [place], [address] Phone: [phone]   Fax:  [phone]  Physical Therapy Treatment  Patient Details  Name: Nicole Crosby MRN: [id number] Date of Birth: 08-02-1966 Referring Provider (PT): Ellouise Newer, MD   Encounter Date: 05/30/2019  PT End of Session - 05/30/19 1737    Visit Number  2    Number of Visits  7    Date for PT Re-Evaluation  06/29/19    Authorization Type  [facility]    PT Start Time  1652    PT Stop Time  1732    PT Time Calculation (min)  40 min    Activity Tolerance  Patient tolerated treatment well;Patient limited by pain    Behavior During Therapy  [facility] for tasks assessed/performed       Past Medical History:  Diagnosis Date  . Allergy    Buspirone, Sulfa, Pantoprazole  . Benign fundic gland polyps of stomach 02/12/2016  . Chicken pox   . DYSTHYMIA 09/30/2009  . Functional dyspepsia   . GERD (gastroesophageal reflux disease)   . H/O adenomatous polyp of colon 10/04/2014   09/2014 - 3 mm adenoma - repeat colonoscopy 2022  . Hyperlipidemia    no meds  . Hypertension    pt denies  . IBS (irritable bowel syndrome)   . Insomnia   . MEDIAL EPICONDYLITIS, RIGHT 07/17/2010  . 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 Other abnormal glucose    Tiny hypodensity left hepatic lobe-CT of abdomen and pelvis 9/38/1829-[facility] 32mm periumblical hernia  . PAC (premature atrial contraction)   . Palpitations     Past Surgical History:  Procedure Laterality Date  . CESAREAN SECTION  01/2001  . COLONOSCOPY    . DE QUERVAIN'S RELEASE  2000   Right wrist area    There were no vitals filed for this visit.  Subjective Assessment - 05/30/19 1653    Subjective  Reporting that she has had more "better" days since last session. Having more pain the longer the day goes on.    Pertinent History  PAC, R medial epicondylitis, HTN, HLD, GERD, R de quervain's release    Diagnostic tests   none recently    Patient Stated Goals  get clearance to get neck MRI    Currently in Pain?  Yes    Pain Score  3     Pain Location  Neck    Pain Orientation  Right;Left;Posterior    Pain Descriptors / Indicators  Aching    Pain Type  Chronic pain                       [facility] Adult PT Treatment/Exercise - 05/30/19 0001      Exercises   Exercises  Neck;Shoulder      Neck Exercises: Machines for Strengthening   UBE (Upper Arm Bike)  L1 x 19min forward/3 min backward      Neck Exercises: Seated   Cervical Rotation  Both;10 reps    Cervical Rotation Limitations  cues to tilt chin and hold 3 sec    Postural Training  cervical extension SNAG x10    Other Seated Exercise  R & L rotation SNAG x10 each side   cues to correct pillow case positioning   Other Seated Exercise  scapular retraction 10x3"   cues for scapular retraction     Shoulder Exercises: Seated   External Rotation  Strengthening;Both;10 reps;Theraband  Theraband Level (Shoulder External Rotation)  Level 1 (Yellow)    External Rotation Weight (lbs)  good form      Shoulder Exercises: Standing   Row  Strengthening;Both;10 reps;Theraband    Theraband Level (Shoulder Row)  Level 2 (Red)    Row Limitations  cues for form      Shoulder Exercises: Stretch   Corner Stretch  2 reps;20 seconds      Neck Exercises: Stretches   Other Neck Stretches  R & L scalene stretch 2x20" with strap assist             PT Education - 05/30/19 1736    Education Details  readministered initial HEP d/t error; updated HEP; edu on use of yoga videos to help with relaxation and relieve muscle tension causing neck and TMJ pain    Person(s) Educated  Patient    Methods  Explanation;Tactile cues;Demonstration;Verbal cues;Handout    Comprehension  Verbalized understanding;Returned demonstration       PT Short Term Goals - 05/30/19 1744      PT SHORT TERM GOAL #1   Title  Patient to be independent with initial HEP.     Time  3    Period  Weeks    Status  On-going    Target Date  06/08/19        PT Long Term Goals - 05/30/19 1744      PT LONG TERM GOAL #1   Title  Patient to be independent with advanced HEP.    Time  6    Period  Weeks    Status  On-going      PT LONG TERM GOAL #2   Title  Patient to demonstrate >=4+/5 strength in B shoulders.    Time  6    Period  Weeks    Status  On-going      PT LONG TERM GOAL #3   Title  Patient to demonstrate [facility] cervical AROM without pain limiting.    Time  6    Period  Weeks    Status  On-going      PT LONG TERM GOAL #4   Title  Patient to report 75% improvement in ability to tolerate checking blind spots while driving.    Time  6    Period  Weeks    Status  On-going      PT LONG TERM GOAL #5   Title  Patient to report 0/10 pain with reaching 5lbs weight off of overhead cabinet.    Time  6    Period  Weeks    Status  On-going            Plan - 05/30/19 1737    Clinical Impression Statement  Patient reporting an increase in "better" days since last appointment. Reviewed HEP with intermittent cues required to correct form and achieve max benefit from exercises. Patient tolerated addition of scalene and pec stretch for increased tissue extensibility- more discomfort with scalene stretch. Initiated cervical extension SNAGs with patient reporting midback pain rather than neck pain. Thus introduced scapular row to focus on scapular retraction and depression as patient has tendency to elevate shoulders. Counseled patient on the benefit of using yoga videos to help with relaxation and relieve muscle tension causing neck and TMJ pain. Patient agreeable to trying yoga. Ended session with no complaints.    Comorbidities  PAC, R medial epicondylitis, HTN, HLD, GERD, R de quervain's release    PT Treatment/Interventions  ADLs/Self Care  Home Management;Cryotherapy;Electrical Stimulation;Moist Heat;Traction;Therapeutic exercise;Therapeutic  activities;Functional mobility training;Ultrasound;Neuromuscular re-education;Patient/family education;Manual techniques;Taping;Energy conservation;Dry needling;Passive range of motion;Scar mobilization    PT Next Visit Plan  progress postural exercises    Consulted and Agree with Plan of Care  Patient       Patient will benefit from skilled therapeutic intervention in order to improve the following deficits and impairments:  Hypomobility, Decreased activity tolerance, Decreased strength, Pain, Increased fascial restricitons, Increased muscle spasms, Decreased range of motion, Improper body mechanics, Impaired flexibility, Postural dysfunction  Visit Diagnosis: 1. Cervicalgia   2. Abnormal posture   3. Muscle weakness (generalized)        Problem List Patient Active Problem List   Diagnosis Date Noted  . Flexural eczema 05/17/2018  . Eyelid dermatitis, allergic/contact 05/17/2018  . Medial epicondylitis, right 03/17/2018  . 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 Vitamin D deficiency 11/03/2017  . Shortened PR interval 09/27/2017  . Greater trochanteric bursitis of left hip 05/12/2017  . B12 deficiency 01/22/2017  . Memory changes 08/28/2016  . 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 Benign fundic gland polyps of stomach 02/12/2016  . Paresthesia 12/17/2015  . Bladder spasm 12/13/2014  . H/O adenomatous polyp of colon 10/04/2014  . Screening for malignant neoplasm of cervix 05/31/2013  . Physical exam 05/31/2013  . Left L3 lumbar radiculitis 09/23/2012  . Neck pain 08/22/2012  . Adrenal nodule ([place]) 03/02/2012  . Essential hypertension, benign 03/02/2012  . IBS (irritable bowel syndrome)   . PAC (premature atrial contraction)   . Insomnia   . GERD (gastroesophageal reflux disease)   . DYSTHYMIA 09/30/2009  . HEADACHE 09/21/2008  . HEPATIC CYST 08/14/2008  . Hyperlipidemia 08/06/2008     Janene Harvey, PT, DPT 05/30/19 5:46 PM   [facility] [address]  [place], [place], [address] Phone: [phone]   Fax:  [phone]  Name: Nicole Crosby MRN: [id number] Date of Birth: Mar 09, 1966

## 2019-06-12 MED FILL — POTASSIUM CHLORIDE CRYS ER: 20 | 30 days supply | Qty: 60 | Fill #2

## 2019-06-13 ENCOUNTER — Other Ambulatory Visit: Payer: Self-pay

## 2019-06-13 ENCOUNTER — Encounter: Payer: Self-pay | Admitting: Physical Therapy

## 2019-06-13 ENCOUNTER — Ambulatory Visit: Payer: 59 | Attending: Neurology | Admitting: Physical Therapy

## 2019-06-13 DIAGNOSIS — M6281 Muscle weakness (generalized): Secondary | ICD-10-CM | POA: Diagnosis not present

## 2019-06-13 DIAGNOSIS — M542 Cervicalgia: Secondary | ICD-10-CM | POA: Diagnosis not present

## 2019-06-13 DIAGNOSIS — R293 Abnormal posture: Secondary | ICD-10-CM | POA: Diagnosis not present

## 2019-06-13 NOTE — Therapy (Signed)
[facility] [address]  [place], [place], [address] Phone: [phone]   Fax:  [phone]  Physical Therapy Treatment  Patient Details  Name: Nicole Crosby MRN: [id number] Date of Birth: 01-Mar-1966 Referring Provider (PT): Ellouise Newer, MD   Encounter Date: 06/13/2019  PT End of Session - 06/13/19 1702    Visit Number  3    Number of Visits  7    Date for PT Re-Evaluation  06/29/19    Authorization Type  [facility]    PT Start Time  0177    PT Stop Time  1708   moist heat   PT Time Calculation (min)  59 min    Activity Tolerance  Patient tolerated treatment well    Behavior During Therapy  [facility] for tasks assessed/performed       Past Medical History:  Diagnosis Date  . Allergy    Buspirone, Sulfa, Pantoprazole  . Benign fundic gland polyps of stomach 02/12/2016  . Chicken pox   . DYSTHYMIA 09/30/2009  . Functional dyspepsia   . GERD (gastroesophageal reflux disease)   . H/O adenomatous polyp of colon 10/04/2014   09/2014 - 3 mm adenoma - repeat colonoscopy 2022  . Hyperlipidemia    no meds  . Hypertension    pt denies  . IBS (irritable bowel syndrome)   . Insomnia   . MEDIAL EPICONDYLITIS, RIGHT 07/17/2010  . 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 Other abnormal glucose    Tiny hypodensity left hepatic lobe-CT of abdomen and pelvis 9/39/0300-[facility] 53mm periumblical hernia  . PAC (premature atrial contraction)   . Palpitations     Past Surgical History:  Procedure Laterality Date  . CESAREAN SECTION  01/2001  . COLONOSCOPY    . DE QUERVAIN'S RELEASE  2000   Right wrist area    There were no vitals filed for this visit.  Subjective Assessment - 06/13/19 1610    Subjective  Reports that she can tell she is getting better. Now able to rotate further and with less pain when she is driving. Does notice that she is getting muscle spasms "to the left of my spine" more often now.    Pertinent History  PAC, R  medial epicondylitis, HTN, HLD, GERD, R de quervain's release    Diagnostic tests  none recently    Patient Stated Goals  get clearance to get neck MRI    Currently in Pain?  Yes    Pain Score  2     Pain Location  Neck    Pain Orientation  Right    Pain Descriptors / Indicators  Aching    Pain Type  Chronic pain                       [facility] Adult PT Treatment/Exercise - 06/13/19 0001      Neck Exercises: Machines for Strengthening   UBE (Upper Arm Bike)  L1.5 x 29min forward/3 min backward      Neck Exercises: Seated   Cervical Rotation  Both;10 reps    Cervical Rotation Limitations  yellow TB   cues to avoid pulling too hard on TB     Shoulder Exercises: Standing   Row  Strengthening;Both;10 reps;Theraband    Theraband Level (Shoulder Row)  Level 3 (Green)    Row Limitations  cues to depress R shoulder      Modalities   Modalities  Moist Heat  Moist Heat Therapy   Number Minutes Moist Heat  10 Minutes    Moist Heat Location  Cervical      Manual Therapy   Manual Therapy  Soft tissue mobilization;Myofascial release    Manual therapy comments  sitting    Soft tissue mobilization  STM to B temporalis, masseter, cervical paraspinals, UT, LS, scalenes, suboccipitals- most tenderness and tightness in R UT and B LS    Myofascial Release  manual TPR to R UT, B LS, L masseter             PT Education - 06/13/19 1702    Education Details  update to HEP; edu on self-release of temporalis and masseter muscles    Person(s) Educated  Patient    Methods  Explanation;Demonstration;Tactile cues;Verbal cues;Handout    Comprehension  Verbalized understanding;Returned demonstration       PT Short Term Goals - 06/13/19 1711      PT SHORT TERM GOAL #1   Title  Patient to be independent with initial HEP.    Time  3    Period  Weeks    Status  Achieved    Target Date  06/08/19        PT Long Term Goals - 05/30/19 1744      PT LONG TERM GOAL #1   Title   Patient to be independent with advanced HEP.    Time  6    Period  Weeks    Status  On-going      PT LONG TERM GOAL #2   Title  Patient to demonstrate >=4+/5 strength in B shoulders.    Time  6    Period  Weeks    Status  On-going      PT LONG TERM GOAL #3   Title  Patient to demonstrate [facility] cervical AROM without pain limiting.    Time  6    Period  Weeks    Status  On-going      PT LONG TERM GOAL #4   Title  Patient to report 75% improvement in ability to tolerate checking blind spots while driving.    Time  6    Period  Weeks    Status  On-going      PT LONG TERM GOAL #5   Title  Patient to report 0/10 pain with reaching 5lbs weight off of overhead cabinet.    Time  6    Period  Weeks    Status  On-going            Plan - 06/13/19 1703    Clinical Impression Statement  Patient arrived to session with report of improvement in neck pain. Notes that she is now able to rotate her head further and with less pain when driving. Also mentions that her dizziness has since also improved, demonstrating that patient's dizziness may have been cervicogenic in origin. Focused today's session on manual therapy to improve cervical and TMJ pain which may contribute to patient's HAs. Patient tolerated STM and manual TPR well. Most tightness evident in R UT, B LS, and L masseter. Patient reported feeling of L cervical paraspinal "spasm" after manual therapy. Worked on progressive postural correction ther-ex with good overall form. Still demonstrating excessive cervical lordosis at rest. Ended session with moist heat to cervical spine for hopeful resolution of muscle twitch response. Patient without complaints at end of session.    Comorbidities  PAC, R medial epicondylitis, HTN, HLD, GERD, R de quervain's release  PT Treatment/Interventions  ADLs/Self Care Home Management;Cryotherapy;Electrical Stimulation;Moist Heat;Traction;Therapeutic exercise;Therapeutic activities;Functional mobility  training;Ultrasound;Neuromuscular re-education;Patient/family education;Manual techniques;Taping;Energy conservation;Dry needling;Passive range of motion;Scar mobilization    PT Next Visit Plan  progress postural exercises    Consulted and Agree with Plan of Care  Patient       Patient will benefit from skilled therapeutic intervention in order to improve the following deficits and impairments:  Hypomobility, Decreased activity tolerance, Decreased strength, Pain, Increased fascial restricitons, Increased muscle spasms, Decreased range of motion, Improper body mechanics, Impaired flexibility, Postural dysfunction  Visit Diagnosis: 1. Cervicalgia   2. Abnormal posture   3. Muscle weakness (generalized)        Problem List Patient Active Problem List   Diagnosis Date Noted  . Flexural eczema 05/17/2018  . Eyelid dermatitis, allergic/contact 05/17/2018  . Medial epicondylitis, right 03/17/2018  . 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 Vitamin D deficiency 11/03/2017  . Shortened PR interval 09/27/2017  . Greater trochanteric bursitis of left hip 05/12/2017  . B12 deficiency 01/22/2017  . Memory changes 08/28/2016  . 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 Benign fundic gland polyps of stomach 02/12/2016  . Paresthesia 12/17/2015  . Bladder spasm 12/13/2014  . H/O adenomatous polyp of colon 10/04/2014  . Screening for malignant neoplasm of cervix 05/31/2013  . Physical exam 05/31/2013  . Left L3 lumbar radiculitis 09/23/2012  . Neck pain 08/22/2012  . Adrenal nodule ([place]) 03/02/2012  . Essential hypertension, benign 03/02/2012  . IBS (irritable bowel syndrome)   . PAC (premature atrial contraction)   . Insomnia   . GERD (gastroesophageal reflux disease)   . DYSTHYMIA 09/30/2009  . HEADACHE 09/21/2008  . HEPATIC CYST 08/14/2008  . Hyperlipidemia 08/06/2008    Janene Harvey, PT, DPT 06/13/19 5:13 PM   [facility] [address]  [place], [place], [address] Phone: [phone]   Fax:  [phone]  Name: ZALEA PETE MRN: [id number] Date of Birth: 30-Aug-1966

## 2019-06-27 ENCOUNTER — Other Ambulatory Visit: Payer: Self-pay

## 2019-06-27 ENCOUNTER — Encounter: Payer: Self-pay | Admitting: Physical Therapy

## 2019-06-27 ENCOUNTER — Ambulatory Visit: Payer: 59 | Admitting: Physical Therapy

## 2019-06-27 DIAGNOSIS — M6281 Muscle weakness (generalized): Secondary | ICD-10-CM

## 2019-06-27 DIAGNOSIS — M542 Cervicalgia: Secondary | ICD-10-CM | POA: Diagnosis not present

## 2019-06-27 DIAGNOSIS — R293 Abnormal posture: Secondary | ICD-10-CM

## 2019-06-27 NOTE — Therapy (Signed)
[facility] [address]  [place], [place], [address] Phone: [phone]   Fax:  [phone]  Physical Therapy Discharge Summary  Patient Details  Name: Nicole Crosby MRN: [id number] Date of Birth: July 07, 1966 Referring Provider (Nicole Crosby): Ellouise Newer, MD   Encounter Date: 06/27/2019  Nicole Crosby End of Session - 06/27/19 1756    Visit Number  4    Number of Visits  7    Date for Nicole Crosby Re-Evaluation  06/29/19    Authorization Type  [facility]    Nicole Crosby Start Time  1700    Nicole Crosby Stop Time  4585    Nicole Crosby Time Calculation (min)  47 min    Activity Tolerance  Patient tolerated treatment well    Behavior During Therapy  [facility] for tasks assessed/performed       Past Medical History:  Diagnosis Date  . Allergy    Buspirone, Sulfa, Pantoprazole  . Benign fundic gland polyps of stomach 02/12/2016  . Chicken pox   . DYSTHYMIA 09/30/2009  . Functional dyspepsia   . GERD (gastroesophageal reflux disease)   . H/O adenomatous polyp of colon 10/04/2014   09/2014 - 3 mm adenoma - repeat colonoscopy 2022  . Hyperlipidemia    no meds  . Hypertension    Nicole Crosby denies  . IBS (irritable bowel syndrome)   . Insomnia   . MEDIAL EPICONDYLITIS, RIGHT 07/17/2010  . 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 Other abnormal glucose    Tiny hypodensity left hepatic lobe-CT of abdomen and pelvis 07/31/2445-[facility] 63m periumblical hernia  . PAC (premature atrial contraction)   . Palpitations     Past Surgical History:  Procedure Laterality Date  . CESAREAN SECTION  01/2001  . COLONOSCOPY    . DE QUERVAIN'S RELEASE  2000   Right wrist area    There were no vitals filed for this visit.  Subjective Assessment - 06/27/19 1704    Subjective  Had soreness for 1-2 days after last session, which went away. Feels like her pain goes away after performing her HEP. Notes that she has much less pain when driving and also notes decreased dizziness when looking up into her pantry.    Pertinent History  PAC, R medial epicondylitis, HTN, HLD, GERD, R de quervain's release    Diagnostic tests  none recently    Patient Stated Goals  get clearance to get neck MRI    Currently in Pain?  Yes    Pain Score  2     Pain Location  Neck    Pain Orientation  Right    Pain Type  Chronic pain         [facility] Nicole Crosby Assessment - 06/27/19 0001      Observation/Other Assessments   Focus on Therapeutic Outcomes (FOTO)   Neck: 70 (30% limited, 34% predicted)      AROM   Cervical Flexion  58    Cervical Extension  48   mild pain across shoulders   Cervical - Right Side Bend  35    Cervical - Left Side Bend  43    Cervical - Right Rotation  56    Cervical - Left Rotation  68      Strength   Right Shoulder Flexion  5/5    Right Shoulder ABduction  5/5    Right Shoulder Internal Rotation  4+/5    Right Shoulder External Rotation  5/5    Left Shoulder Flexion  5/5  Left Shoulder ABduction  5/5    Left Shoulder Internal Rotation  4+/5    Left Shoulder External Rotation  5/5                   [facility] Adult Nicole Crosby Treatment/Exercise - 06/27/19 0001      Self-Care   Self-Care  Other Self-Care Comments    Other Self-Care Comments   edu and practice of use of theracane and tennis ball for self-STM to L rhmboids      Shoulder Exercises: Prone   Horizontal ABduction 1  Strengthening;Both;10 reps    Horizontal ABduction 1 Limitations  good form    Other Prone Exercises  prone Y with hands behindhead x10   limited ROM     Shoulder Exercises: Standing   Other Standing Exercises  standing Y lift offs x10   manual cues to avoid shoulder elevation     Manual Therapy   Manual Therapy  Soft tissue mobilization    Manual therapy comments  sitting    Soft tissue mobilization  STM to L rhomboid and LS- tenderness at proximal end of rhomboids             Nicole Crosby Education - 06/27/19 1755    Education Details  update to HEP and review of curent HEP exercises; discussion on  objective progress with Nicole Crosby    Person(s) Educated  Patient    Methods  Explanation;Demonstration;Tactile cues;Verbal cues;Handout    Comprehension  Verbalized understanding;Returned demonstration       Nicole Crosby Short Term Goals - 06/27/19 1756      Nicole Crosby SHORT TERM GOAL #1   Title  Patient to be independent with initial HEP.    Time  3    Period  Weeks    Status  Achieved    Target Date  06/08/19        Nicole Crosby Long Term Goals - 06/27/19 1709      Nicole Crosby LONG TERM GOAL #1   Title  Patient to be independent with advanced HEP.    Time  6    Period  Weeks    Status  Achieved      Nicole Crosby LONG TERM GOAL #2   Title  Patient to demonstrate >=4+/5 strength in B shoulders.    Time  6    Period  Weeks    Status  Achieved   has shown significant improvements in B shoulders, in all planes of motion     Nicole Crosby LONG TERM GOAL #3   Title  Patient to demonstrate [facility] cervical AROM without pain limiting.    Time  6    Period  Weeks    Status  Partially Met   improvements in cervical flexion, extension, B sidebending, and L rotation, with only mild pain remaining with extension     Nicole Crosby LONG TERM GOAL #4   Title  Patient to report 75% improvement in ability to tolerate checking blind spots while driving.    Time  6    Period  Weeks    Status  Achieved   reports 95%     Nicole Crosby LONG TERM GOAL #5   Title  Patient to report 0/10 pain with reaching 5lbs weight off of overhead cabinet.    Time  6    Period  Weeks    Status  Achieved            Plan - 06/27/19 1803    Clinical Impression Statement  Patient arrived to  session with report of "significant improvement" in neck pain since initial eval. Notes that she is now in much less pain with driving and looking up into her pantry. Also notes that her dizziness has also improved. Patient has met or partially met all goals at this time. Strength testing revealed significant improvements in B shoulders, in all planes of motion. Cervical AROM has improved in flexion,  extension, B sidebending, and L rotation, with only mild pain remaining with extension. Patient reports 95% improvement in pain levels when checking blind spots while driving. Also able to reach overhead with 5lbs without pain limiting. Tolerated STM to L rhomboids and LS due to c/o N/T medial to scapula. Educated patient on self-STM and HEP to address periscapular strength. Patient reported understanding and with no complaints at end of session. Patient D/C at this time d/t meeting goals and satisfaction with CLOF.    Comorbidities  PAC, R medial epicondylitis, HTN, HLD, GERD, R de quervain's release    Nicole Crosby Treatment/Interventions  ADLs/Self Care Home Management;Cryotherapy;Electrical Stimulation;Moist Heat;Traction;Therapeutic exercise;Therapeutic activities;Functional mobility training;Ultrasound;Neuromuscular re-education;Patient/family education;Manual techniques;Taping;Energy conservation;Dry needling;Passive range of motion;Scar mobilization    Nicole Crosby Next Visit Plan  DC at this time    Consulted and Agree with Plan of Care  Patient       Patient will benefit from skilled therapeutic intervention in order to improve the following deficits and impairments:  Hypomobility, Decreased activity tolerance, Decreased strength, Pain, Increased fascial restricitons, Increased muscle spasms, Decreased range of motion, Improper body mechanics, Impaired flexibility, Postural dysfunction  Visit Diagnosis: Cervicalgia  Abnormal posture  Muscle weakness (generalized)     Problem List Patient Active Problem List   Diagnosis Date Noted  . Flexural eczema 05/17/2018  . Eyelid dermatitis, allergic/contact 05/17/2018  . Medial epicondylitis, right 03/17/2018  . 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 Vitamin D deficiency 11/03/2017  . Shortened PR interval 09/27/2017  . Greater trochanteric bursitis of left hip 05/12/2017  . B12 deficiency 01/22/2017  . Memory changes 08/28/2016  . Nonulcer  dyspepsia 02/12/2016  . Benign fundic gland polyps of stomach 02/12/2016  . Paresthesia 12/17/2015  . Bladder spasm 12/13/2014  . H/O adenomatous polyp of colon 10/04/2014  . Screening for malignant neoplasm of cervix 05/31/2013  . Physical exam 05/31/2013  . Left L3 lumbar radiculitis 09/23/2012  . Neck pain 08/22/2012  . Adrenal nodule ([place]) 03/02/2012  . Essential hypertension, benign 03/02/2012  . IBS (irritable bowel syndrome)   . PAC (premature atrial contraction)   . Insomnia   . GERD (gastroesophageal reflux disease)   . DYSTHYMIA 09/30/2009  . HEADACHE 09/21/2008  . HEPATIC CYST 08/14/2008  . Hyperlipidemia 08/06/2008     PHYSICAL THERAPY DISCHARGE SUMMARY  Visits from Start of Care: 4  Current functional level related to goals / functional outcomes: See above clinical summary   Remaining deficits: Mild pain with cervical extension   Education / Equipment: HEP  Plan: Patient agrees to discharge.  Patient goals were partially met. Patient is being discharged due to meeting the stated rehab goals.  ?????     Nicole Crosby, Nicole Crosby, Nicole Crosby 06/27/19 6:05 PM   [facility] [address]  [address] [place], [place], [address] Phone: [phone]   Fax:  [phone]  Name: Nicole Crosby MRN: [id number] Date of Birth: [birth year]

## 2019-07-18 MED FILL — POTASSIUM CHLORIDE CRYS ER: 20 | 30 days supply | Qty: 60 | Fill #3

## 2019-08-07 ENCOUNTER — Other Ambulatory Visit: Payer: Self-pay | Admitting: Family Medicine

## 2019-08-07 MED FILL — LOSARTAN-HCTZ 50-12.5 MG TA: 50-12.5 | 30 days supply | Qty: 30 | Fill #0

## 2019-08-17 ENCOUNTER — Encounter: Payer: Self-pay | Admitting: Family Medicine

## 2019-08-17 ENCOUNTER — Emergency Department (INDEPENDENT_AMBULATORY_CARE_PROVIDER_SITE_OTHER)
Admission: EM | Admit: 2019-08-17 | Discharge: 2019-08-17 | Disposition: A | Discharge status: Home / Self Care | Payer: 59 | Source: Home / Self Care

## 2019-08-17 ENCOUNTER — Other Ambulatory Visit: Payer: Self-pay

## 2019-08-17 DIAGNOSIS — R202 Paresthesia of skin: Secondary | ICD-10-CM | POA: Diagnosis not present

## 2019-08-17 MED ORDER — PREDNISONE 20 MG PO TABS: ORAL_TABLET | ORAL | 1 refills | Status: DC

## 2019-08-17 MED FILL — predniSONE 20 MG TABS: 20 | 5 days supply | Qty: 5 | Fill #0

## 2019-08-17 NOTE — ED Provider Notes (Addendum)
Nicole Crosby CARE    CSN: [id number] Arrival date & time: 08/17/19  1445      History   Chief Complaint Chief Complaint  Patient presents with   Numbness    HPI Nicole Crosby is a 53 y.o. female.   Initial [facility] visit  Pt c/o LT side facial numbness x 2 days. Sometimes radiates to LT neck and shoulder. RT hand numbness today.  The numbness is spontaneous and patient states that she can feel normally when she touches her skin but she has a sense of deadness in the skin of her cheek up to the periorbital region and also in the left neck region.  This is a fairly constant phenomenon, unassociated with any pain or trauma.  She has had no change in her vision, hearing, movement of facial muscles, arms, or headache.  Patient's had no fever.  She takes a supplement that includes vitamins.   Note from 05/16/2019: History of Present Illness:  The patient was seen as a virtual video visit on 05/16/2019. She was last seen 7 months ago for vertigo and pressure in the back of her head. She felt a spinning sensation with her eyes moving, felt off balance, with nausea and dry heaving. She had an MRI brain with and without contrast ordered by ENT which I personally reviewed, no acute changes, brain normal. She went for vestibular therapy but was unable to have Epley maneuver done due to neck pain. She reports the dizziness is not as much as before, mostly occurring when she is supine on her cellphone, she feels like she is falling. She shifts position and the symptoms resolve. She thinks the problem is in her neck, she starts having neck pain at the end of the day and takes Flexeril, which causes her to have a headache the next day. She has neck pain when turning her head to the right, such as when changing lanes. One time she was brushing her teeth and turned her head to the right then felt like she would pass out, she had nausea and felt like she would fall over. She felt similarly when looking  up at the pantry cabinet. Symptoms resolved when she changed positions. She has occasional tingling in her arms, L>R. 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 No vision changes. She has chronic left hip pain.   There is an MRI cervical spine from 2013 reporting mild cervical spondylosis and tiny central disc protrusion at C6-7 without resulting stenosis. History of Present Illness:  The patient was seen as a virtual video visit on 05/16/2019. She was last seen 7 months ago for vertigo and pressure in the back of her head. She felt a spinning sensation with her eyes moving, felt off balance, with nausea and dry heaving. She had an MRI brain with and without contrast ordered by ENT which I personally reviewed, no acute changes, brain normal. She went for vestibular therapy but was unable to have Epley maneuver done due to neck pain. She reports the dizziness is not as much as before, mostly occurring when she is supine on her cellphone, she feels like she is falling. She shifts position and the symptoms resolve. She thinks the problem is in her neck, she starts having neck pain at the end of the day and takes Flexeril, which causes her to have a headache the next day. She has neck pain  when turning her head to the right, such as when changing lanes. One time she was brushing her teeth and turned her head to the right then felt like she would pass out, she had nausea and felt like she would fall over. She felt similarly when looking up at the pantry cabinet. Symptoms resolved when she changed positions. She has occasional tingling in her arms, L>R. 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 No vision changes. She has chronic left hip pain.   There is an MRI cervical spine from 2013 reporting mild cervical spondylosis and tiny  central disc protrusion at C6-7 without resulting stenosis.      Past Medical History:  Diagnosis Date   Allergy    Buspirone, Sulfa, Pantoprazole   Benign fundic gland polyps of stomach 02/12/2016   Chicken pox    DYSTHYMIA 09/30/2009   Functional dyspepsia    GERD (gastroesophageal reflux disease)    H/O adenomatous polyp of colon 10/04/2014   09/2014 - 3 mm adenoma - repeat colonoscopy 2022   Hyperlipidemia    no meds   Hypertension    pt denies   IBS (irritable bowel syndrome)    Insomnia    MEDIAL EPICONDYLITIS, RIGHT 07/17/2010   Nonulcer dyspepsia 02/12/2016   Other abnormal glucose    Tiny hypodensity left hepatic lobe-CT of abdomen and pelvis 99991111 82mm periumblical hernia   PAC (premature atrial contraction)    Palpitations     Patient Active Problem List   Diagnosis Date Noted   Flexural eczema 05/17/2018   Eyelid dermatitis, allergic/contact 05/17/2018   Medial epicondylitis, right 03/17/2018   Ulnar nerve entrapment at elbow, 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   Vitamin D deficiency 11/03/2017   Shortened PR interval 09/27/2017   Greater trochanteric bursitis of left hip 05/12/2017   B12 deficiency 01/22/2017   Memory changes 08/28/2016   Nonulcer dyspepsia 02/12/2016   Benign fundic gland polyps of stomach 02/12/2016   Paresthesia 12/17/2015   Bladder spasm 12/13/2014   H/O adenomatous polyp of colon 10/04/2014   Screening for malignant neoplasm of cervix 05/31/2013   Physical exam 05/31/2013   Left L3 lumbar radiculitis 09/23/2012   Neck pain 08/22/2012   Adrenal nodule ([place]) 03/02/2012   Essential hypertension, benign 03/02/2012   IBS (irritable bowel syndrome)    PAC (premature atrial contraction)    Insomnia    GERD (gastroesophageal reflux disease)    DYSTHYMIA 09/30/2009   HEADACHE 09/21/2008   HEPATIC CYST 08/14/2008   Hyperlipidemia 08/06/2008    Past Surgical History:  Procedure Laterality Date    CESAREAN SECTION  01/2001   COLONOSCOPY     DE QUERVAIN'S RELEASE  2000   Right wrist area    OB History    Gravida  2   Para  2   Term      Preterm      AB      Living        SAB      TAB      Ectopic      Multiple      Live Births               Home Medications    Prior to Admission medications   Medication Sig Start Date End Date Taking? Authorizing Provider  Calcium Carbonate-Vit D-Min (CALCIUM 1200 PO) Take 1,200 Units by mouth daily.    [provider]  cholecalciferol (VITAMIN D) 1000 UNITS tablet Take 1,000 Units by mouth daily.     [provider]  Crisaborole (EUCRISA) 2 % OINT Apply 1 application topically 2 (two) times daily. 05/04/18   Midge Minium, MD  diazepam (VALIUM) 5 MG tablet Take 1 tablet (5 mg total) by mouth every 8 (eight) hours as needed (vertigo). 12/19/17   Carlisle Cater, PA-C  losartan-hydrochlorothiazide (HYZAAR) 50-12.5 MG tablet Take 1 tablet by mouth daily. Please schedule a follow up with Dr. Birdie Riddle for further refills [phone] 08/07/19   Midge Minium, MD  meclizine (ANTIVERT) 25 MG tablet Take 25 mg by mouth 2 (two) times daily as needed for dizziness.     [provider]  OSCIMIN 0.125 MG SUBL  12/16/17   [provider]  potassium chloride SA (K-DUR) 20 MEQ tablet TAKE 1 TABLET BY MOUTH TWICE DAILY 04/03/19   Midge Minium, MD  predniSONE (DELTASONE) 20 MG tablet 1 daily with food 08/17/19   Robyn Haber, MD  tiZANidine (ZANAFLEX) 4 MG tablet Take 1 tablet as needed at bedtime for muscle spasms 05/16/19   Cameron Sprang, MD  vitamin B-12 (CYANOCOBALAMIN) 500 MCG tablet Take 500 mcg by mouth daily.    [provider]    Family History Family History  Problem Relation Age of Onset   Hypertension Mother    Hyperlipidemia Mother    Fibrocystic breast disease Mother    Hearing loss Mother    Osteopenia Mother    Prostate cancer Father 31        deceased   Hypertension Father    Diabetes Father    Kidney disease Father    Heart disease Father        MI   Hyperlipidemia Father    Other Brother        unknown   Thyroid disease Sister    Diabetes Sister        pre-diabetes   Hypertension Sister    Arthritis Sister    Diabetes Sister    Hypertension Sister    Hyperlipidemia Sister    Fibrocystic breast disease Sister    Colon cancer Maternal Uncle        died in late 2's   Diabetes Maternal Grandmother    Prostate cancer Maternal Grandfather    COPD Maternal Grandfather    Hyperlipidemia Sister    Hypertension Sister    Rectal cancer Neg Hx    Stomach cancer Neg Hx    Esophageal cancer Neg Hx    Pancreatic cancer Neg Hx     Social History Social History   Tobacco Use   Smoking status: Never Smoker   Smokeless tobacco: Never Used  Substance Use Topics   Alcohol use: Yes    Alcohol/week: 1.0 standard drinks    Types: 1 Glasses of wine per week    Comment: occasional   Drug use: No     Allergies   Buspirone, Esomeprazole, Lansoprazole, Omeprazole, and Sulfonamide derivatives   Review of Systems Review of Systems  Neurological: Positive for numbness.  All other systems reviewed and are negative.    Physical Exam Triage Vital Signs ED Triage Vitals [08/17/19 1455]  Enc Vitals Group     BP (!) 159/77     Pulse Rate 98     Resp 18     Temp 97.8 F (36.6 C)     Temp Source Temporal     SpO2 99 %  Weight      Height      Head Circumference      Peak Flow      Pain Score 0     Pain Loc      Pain Edu?      Excl. in [place]?    No data found.  Updated Vital Signs BP (!) 159/77 (BP Location: Right Arm)    Pulse 98    Temp 97.8 F (36.6 C) (Temporal)    Resp 18    Ht 5\' 1"  (1.549 m)    Wt 62.1 kg    SpO2 99%    BMI 25.89 kg/m    Physical Exam Vitals signs and nursing note reviewed.  Constitutional:      Appearance: Normal appearance. She is normal weight.    HENT:     Head: Normocephalic and atraumatic.     Right Ear: Tympanic membrane and external ear normal.     Left Ear: Tympanic membrane and external ear normal.     Mouth/Throat:     Mouth: Mucous membranes are moist.     Pharynx: Oropharynx is clear.  Eyes:     Extraocular Movements: Extraocular movements intact.     Conjunctiva/sclera: Conjunctivae normal.     Pupils: Pupils are equal, round, and reactive to light.  Neck:     Musculoskeletal: Normal range of motion and neck supple. No neck rigidity or muscular tenderness.     Vascular: No carotid bruit.  Cardiovascular:     Rate and Rhythm: Normal rate.     Heart sounds: Normal heart sounds.  Pulmonary:     Effort: Pulmonary effort is normal.  Musculoskeletal: Normal range of motion.  Skin:    General: Skin is warm and dry.  Neurological:     General: No focal deficit present.     Mental Status: She is alert and oriented to person, place, and time.     Cranial Nerves: No cranial nerve deficit.     Motor: No weakness.     Coordination: Coordination normal.     Gait: Gait normal.  Psychiatric:        Mood and Affect: Mood normal.        Behavior: Behavior normal.        Thought Content: Thought content normal.        Judgment: Judgment normal.      UC Treatments / Results  Labs (all labs ordered are listed, but only abnormal results are displayed) Labs Reviewed - No data to display  EKG   Radiology COMPARISON:  CT 07/15/2018.  MRI 02/03/2014.  FINDINGS: Brain: The brain has a normal appearance without evidence of malformation, atrophy, old or acute small or large vessel infarction, mass lesion, hemorrhage, hydrocephalus or extra-axial collection. CP angle regions are normal. Seventh and eighth nerve complexes are normal. No vestibular schwannoma. No enhancing neuritis. No visible inner ear lesion. Abnormal contrast enhancement.  Vascular: Major vessels at the base of the brain show flow. Venous sinuses  appear patent.  Skull and upper cervical spine: Normal.  Sinuses/Orbits: Clear/normal.  Other: None significant.  IMPRESSION: Normal examination. No abnormality seen to explain the presenting symptoms.   Electronically Signed   By: Nelson Chimes M.D.   On: 10/10/2018 10:18  Procedures Procedures (including critical care time)  Medications Ordered in UC Medications - No data to display  Initial Impression / Assessment and Plan / UC Course  I have reviewed the triage vital signs and the nursing notes.  Pertinent labs & imaging results that were available during my care of the patient were reviewed by me and considered in my medical decision making (see chart for details).    Final Clinical Impressions(s) / UC Diagnoses   Final diagnoses:  Paresthesia  This paresthesia seems most consistent with a peripheral 5th cranial nerve irritation.  Other considerations should be given to multiple sclerosis, acoustic neuroma, or some other intracranial lesion that is interfering with the 5th cranial nerve.  At this point, symptoms are mild in a degree can try to reduce inflammation of the 5th cranial nerve for the next several days without sacrificing risk of missing some other problem.   Discharge Instructions     The localizing nature of this numbness is most consistent with a peripheral nerve irritation.  The reason we are using a stronger anti-inflammatory than ibuprofen to try to get the nerve to function normally.  If you are not improving over the next 3 to 5 days, please return so we can discuss further evaluation    ED Prescriptions    Medication Sig Dispense Auth. Provider   predniSONE (DELTASONE) 20 MG tablet 1 daily with food 5 tablet Robyn Haber, MD     I have reviewed the PDMP during this encounter.   Robyn Haber, MD 08/17/19 1516    Robyn Haber, MD 08/17/19 1523

## 2019-08-17 NOTE — ED Triage Notes (Signed)
Pt c/o LT side facial numbness x 2 days. Sometimes radiates to LT neck and shoulder. RT hand numbness today.

## 2019-08-17 NOTE — Discharge Instructions (Addendum)
The localizing nature of this numbness is most consistent with a peripheral nerve irritation.  The reason we are using a stronger anti-inflammatory than ibuprofen to try to get the nerve to function normally.  If you are not improving over the next 3 to 5 days, please return so we can discuss further evaluation

## 2019-08-22 ENCOUNTER — Other Ambulatory Visit: Payer: Self-pay

## 2019-08-22 ENCOUNTER — Encounter (HOSPITAL_BASED_OUTPATIENT_CLINIC_OR_DEPARTMENT_OTHER): Payer: Self-pay | Admitting: *Deleted

## 2019-08-22 ENCOUNTER — Emergency Department (HOSPITAL_COMMUNITY): Payer: 59

## 2019-08-22 ENCOUNTER — Emergency Department (HOSPITAL_BASED_OUTPATIENT_CLINIC_OR_DEPARTMENT_OTHER)
Admission: EM | Admit: 2019-08-22 | Discharge: 2019-08-22 | Disposition: A | Discharge status: Home / Self Care | Payer: 59 | Attending: Emergency Medicine | Admitting: Emergency Medicine

## 2019-08-22 ENCOUNTER — Emergency Department (HOSPITAL_BASED_OUTPATIENT_CLINIC_OR_DEPARTMENT_OTHER): Payer: 59

## 2019-08-22 DIAGNOSIS — R079 Chest pain, unspecified: Secondary | ICD-10-CM | POA: Insufficient documentation

## 2019-08-22 DIAGNOSIS — R202 Paresthesia of skin: Secondary | ICD-10-CM | POA: Insufficient documentation

## 2019-08-22 DIAGNOSIS — R0602 Shortness of breath: Secondary | ICD-10-CM | POA: Insufficient documentation

## 2019-08-22 DIAGNOSIS — R6884 Jaw pain: Secondary | ICD-10-CM | POA: Diagnosis not present

## 2019-08-22 DIAGNOSIS — Z20828 Contact with and (suspected) exposure to other viral communicable diseases: Secondary | ICD-10-CM | POA: Diagnosis not present

## 2019-08-22 LAB — BASIC METABOLIC PANEL
Anion gap: 13 (ref 5–15)
BUN: 13 mg/dL (ref 6–20)
CO2: 28 mmol/L (ref 22–32)
Calcium: 10.5 mg/dL — ABNORMAL HIGH (ref 8.9–10.3)
Chloride: 96 mmol/L — ABNORMAL LOW (ref 98–111)
Creatinine, Ser: 0.63 mg/dL (ref 0.44–1.00)
GFR calc Af Amer: >60 mL/min (ref >60)
GFR calc non Af Amer: >60 mL/min (ref >60)
Glucose, Bld: 94 mg/dL (ref 70–99)
Potassium: 3.2 mmol/L — ABNORMAL LOW (ref 3.5–5.1)
Sodium: 137 mmol/L (ref 135–145)

## 2019-08-22 LAB — HCG, SERUM, QUALITATIVE: Preg, Serum: NEGATIVE

## 2019-08-22 LAB — CBC
HCT: 45.2 % (ref 36.0–46.0)
Hemoglobin: 14.5 g/dL (ref 12.0–15.0)
MCH: 29.8 pg (ref 26.0–34.0)
MCHC: 32.1 g/dL (ref 30.0–36.0)
MCV: 92.8 fL (ref 80.0–100.0)
Platelets: 340 10*3/uL (ref 150–400)
RBC: 4.87 MIL/uL (ref 3.87–5.11)
RDW: 12.2 % (ref 11.5–15.5)
WBC: 11.4 10*3/uL — ABNORMAL HIGH (ref 4.0–10.5)
nRBC: 0 % (ref 0.0–0.2)

## 2019-08-22 LAB — TROPONIN I (HIGH SENSITIVITY)
Troponin I (High Sensitivity): <2 ng/L (ref <18)
Troponin I (High Sensitivity): <2 ng/L (ref <18)

## 2019-08-22 LAB — SARS CORONAVIRUS 2 (TAT 6-24 HRS): SARS Coronavirus 2: NEGATIVE

## 2019-08-22 MED ORDER — GADOBUTROL 1 MMOL/ML IV SOLN
5.0000 mL | Freq: Once | INTRAVENOUS | Status: AC | PRN
  Administered 2019-08-22: 5 mL via INTRAVENOUS

## 2019-08-22 NOTE — ED Provider Notes (Signed)
Patient sent to [facility] from [facility] by Dr. Sedonia Small for MRI of head at recommendation of Dr. Cheral Marker of neurology.  Please see by notes by Dr. Sedonia Small for complete history and work-up.  Briefly: Patient has jaw pain and tingling in her lips and was dx with neuralgia by PCP and given steroids. Today states sx have persisted and now include left arm and leg paresthesia that feels unusual for her. Intermittent CP and SOB for 2 weeks -- patient received ACS workup which was negative.   Denies weakness or numbness, HA, vision changes, abdominal pain.    Plan is MRI -- if normal patient to follow-up with neurology outpatient. Otherwise discuss with neurology.     PE CONSTITUTIONAL:  well-appearing, NAD NEURO:  Alert and oriented x 3, no focal deficits; moves all 4 remedies with grossly equal strength.  Equal grip strength bilaterally Speech is normal. EYES:  pupils equal and reactive ENT/NECK:  trachea midline, no JVD CARDIO:  reg rate, reg rhythm, well-perfused PULM:  None labored breathing on NRB GI/GU:  Abdomin non-distended MSK/SPINE:  No gross deformities, no edema SKIN:  no rash, atraumatic PSYCH:  Appropriate speech and behavior   Discharge MRI without acute abnormality.  Discharged patient home patient states she has appointment with low our neurology already.  Encourage patient to schedule appointment for sooner for reevaluation.  Discussed strict return precautions with patient.  Discussed symptoms concerning for a stroke.  Patient is well-appearing and has vitals within normal limits at time of discharge.   Tedd Sias, [place] 08/23/19 0017    Lennice Sites, DO 08/23/19 0128

## 2019-08-22 NOTE — ED Provider Notes (Signed)
[facility] Emergency Department Provider Note MRN:  [id number]  Arrival date & time: 08/22/19     Chief Complaint   Paresthesia History of Present Illness   Nicole Crosby is a 53 y.o. year-old female with a history of hypertension presenting to the ED with chief complaint of paresthesia.  Patient explains that she began experiencing mild left jaw pain with tingling sensation to the left side of her face.  She was seen by a doctor a few days ago for this condition and was told she had inflammation of cranial nerve V and provided with a prescription for prednisone.  She explains that her symptoms have persisted and now involve the left arm and left leg.  Denies weakness or numbness but endorses a paresthesia to the side of the body and explains that "it just does not feel right".  Denies headache, no vision change.  Also endorsing intermittent chest pain and shortness of breath for the past 2 weeks.  Denies abdominal pain.  Review of Systems  A complete 10 system review of systems was obtained and all systems are negative except as noted in the HPI and PMH.   Patient's Health History    Past Medical History:  Diagnosis Date  . Allergy    Buspirone, Sulfa, Pantoprazole  . Benign fundic gland polyps of stomach 02/12/2016  . Chicken pox   . DYSTHYMIA 09/30/2009  . Functional dyspepsia   . GERD (gastroesophageal reflux disease)   . H/O adenomatous polyp of colon 10/04/2014   09/2014 - 3 mm adenoma - repeat colonoscopy 2022  . Hyperlipidemia    no meds  . Hypertension    pt denies  . IBS (irritable bowel syndrome)   . Insomnia   . MEDIAL EPICONDYLITIS, RIGHT 07/17/2010  . 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 Other abnormal glucose    Tiny hypodensity left hepatic lobe-CT of abdomen and pelvis [id number] 36mm periumblical hernia  . PAC (premature atrial contraction)   . Palpitations     Past Surgical History:  Procedure Laterality Date  . CESAREAN  SECTION  01/2001  . COLONOSCOPY    . DE QUERVAIN'S RELEASE  2000   Right wrist area    Family History  Problem Relation Age of Onset  . Hypertension Mother   . Hyperlipidemia Mother   . Fibrocystic breast disease Mother   . Hearing loss Mother   . Osteopenia Mother   . Prostate cancer Father 72       deceased  . Hypertension Father   . Diabetes Father   . Kidney disease Father   . Heart disease Father        MI  . Hyperlipidemia Father   . Other Brother        unknown  . Thyroid disease Sister   . Diabetes Sister        pre-diabetes  . Hypertension Sister   . Arthritis Sister   . Diabetes Sister   . Hypertension Sister   . Hyperlipidemia Sister   . Fibrocystic breast disease Sister   . Colon cancer Maternal Uncle        died in late 28's  . Diabetes Maternal Grandmother   . Prostate cancer Maternal Grandfather   . COPD Maternal Grandfather   . Hyperlipidemia Sister   . Hypertension Sister   . Rectal cancer Neg Hx   . Stomach cancer Neg Hx   . Esophageal cancer Neg Hx   . Pancreatic cancer Neg Hx  Social History   Socioeconomic History  . Marital status: Married    Spouse name: Not on file  . Number of children: 2  . Years of education: Not on file  . Highest education level: Not on file  Occupational History  . Occupation: Xray/CT Engineer, production: [employer]  . Financial resource strain: Not on file  . Food insecurity    Worry: Not on file    Inability: Not on file  . Transportation needs    Medical: Not on file    Non-medical: Not on file  Tobacco Use  . Smoking status: Never Smoker  . Smokeless tobacco: Never Used  Substance and Sexual Activity  . Alcohol use: Yes    Alcohol/week: 1.0 standard drinks    Types: 1 Glasses of wine per week    Comment: occasional  . Drug use: No  . Sexual activity: Yes    Birth control/protection: Inserts  Lifestyle  . Physical activity    Days per week: Not on file    Minutes per session:  Not on file  . Stress: Not on file  Relationships  . Social Herbalist on phone: Not on file    Gets together: Not on file    Attends religious service: Not on file    Active member of club or organization: Not on file    Attends meetings of clubs or organizations: Not on file    Relationship status: Not on file  . Intimate partner violence    Fear of current or ex partner: Not on file    Emotionally abused: Not on file    Physically abused: Not on file    Forced sexual activity: Not on file  Other Topics Concern  . 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    Alcohol Use - yes-wine         Full Time    Married             Physical Exam  Vital Signs and Nursing Notes reviewed Vitals:   08/22/19 1230 08/22/19 1300  BP: (!) 149/75 134/77  Pulse: 80 83  Resp: 16 15  Temp:    SpO2: 100% 98%    CONSTITUTIONAL: Well-appearing, NAD NEURO:  Alert and oriented x 3, normal and symmetric strength and sensation, normal coordination, normal speech EYES:  eyes equal and reactive ENT/NECK:  no LAD, no JVD CARDIO: Regular rate, well-perfused, normal S1 and S2 PULM:  CTAB no wheezing or rhonchi GI/GU:  normal bowel sounds, non-distended, non-tender MSK/SPINE:  No gross deformities, no edema SKIN:  no rash, atraumatic PSYCH:  Appropriate speech and behavior  Diagnostic and Interventional Summary    EKG Interpretation  Date/Time:  Tuesday August 22 2019 12:24:18 EDT Ventricular Rate:  78 PR Interval:    QRS Duration: 85 QT Interval:  395 QTC Calculation: 450 R Axis:   39 Text Interpretation:  Sinus rhythm Borderline short PR interval Probable left atrial enlargement Confirmed by Gerlene Fee [phone]) on 08/22/2019 1:56:14 PM      Labs Reviewed  CBC - Abnormal; Notable for the following components:      Result Value   WBC 11.4 (*)    All other components within normal limits  BASIC METABOLIC PANEL -  Abnormal; Notable for the following components:   Potassium 3.2 (*)    Chloride 96 (*)  Calcium 10.5 (*)    All other components within normal limits  HCG, SERUM, QUALITATIVE  TROPONIN I (HIGH SENSITIVITY)  TROPONIN I (HIGH SENSITIVITY)    DG Chest 2 View  Final Result    CT HEAD WO CONTRAST  Final Result    MR Brain W and Wo Contrast    (Results Pending)    Medications - No data to display   Procedures Critical Care  ED Course and Medical Decision Making  I have reviewed the triage vital signs and the nursing notes.  Pertinent labs & imaging results that were available during my care of the patient were reviewed by me and considered in my medical decision making (see below for details).  Patient was originally diagnosed with peripheral nerve disorder but now given the spread to the left arm and left leg this raises concern for a CNS problem, considering stroke, MS.  We will start with CT head and consult neurology to discuss need for MRI if CT head is unremarkable.  Would need transfer to [facility] or [facility].  Patient's chest pain is atypical, intermittent, not exertional, will screen with EKG and troponin.  No objective neurological deficits to suggest dissection or embolic phenomenon.  No leg pain or swelling, no shortness of breath, nothing to suggest PE.  Troponin is negative, patient's chest pain complaints are appropriate for outpatient follow-up.  Patient continues to have paresthesia to the left side of her body, discussed with Dr. Cheral Marker of neurology, who agrees that MRI today would be reasonable to exclude acute process.  Dr. Melina Copa is the accepting ED to ED transfer Dr., Patient will have her husband transport her to [facility].  With a negative MRI, anticipating discharge with neuro follow-up.  Barth Kirks. Sedonia Small, MD [place] [email or web address]  Final Clinical Impressions(s) / ED Diagnoses     ICD-10-CM   1.  Paresthesia  R20.2   2. Chest pain  R07.9 DG Chest 2 View    DG Chest 2 View    ED Discharge Orders    None      Discharge Instructions Discussed with and Provided to Patient: Discharge Instructions   None       Maudie Flakes, MD 08/22/19 1357

## 2019-08-22 NOTE — ED Notes (Signed)
Attempted IV to RAC, unsuccessful, tol well. Rod Holler RN to attempt IV via U/S

## 2019-08-22 NOTE — ED Notes (Signed)
Reviewed discharge instructions with pt.  Pt verbalized understanding

## 2019-08-22 NOTE — ED Notes (Signed)
Patient transported to CT 

## 2019-08-22 NOTE — ED Notes (Signed)
Pt on monitor 

## 2019-08-22 NOTE — Discharge Instructions (Signed)
Follow-up with with our neurology.  Please call to make an appointment when possible.  I have printed your MRI results for you.

## 2019-08-22 NOTE — ED Triage Notes (Signed)
Jaw pain and tingling in her lips since 11am.   She was seen for similar symptoms 5 days ago and diagnosed with neuralgia.

## 2019-08-22 NOTE — ED Notes (Signed)
Pt arrived POV , husband at bedside, IV intact . Awaiting MRI

## 2019-08-25 ENCOUNTER — Telehealth: Payer: Self-pay | Admitting: General Practice

## 2019-08-25 ENCOUNTER — Other Ambulatory Visit: Payer: Self-pay

## 2019-08-25 ENCOUNTER — Encounter: Payer: Self-pay | Admitting: Physician Assistant

## 2019-08-25 ENCOUNTER — Ambulatory Visit (HOSPITAL_BASED_OUTPATIENT_CLINIC_OR_DEPARTMENT_OTHER)
Admission: RE | Admit: 2019-08-25 | Discharge: 2019-08-25 | Disposition: A | Discharge status: Home / Self Care | Payer: 59 | Source: Ambulatory Visit | Attending: Physician Assistant | Admitting: Physician Assistant

## 2019-08-25 ENCOUNTER — Ambulatory Visit (INDEPENDENT_AMBULATORY_CARE_PROVIDER_SITE_OTHER): Payer: 59 | Admitting: Physician Assistant

## 2019-08-25 VITALS — HR 101 | Wt 131.0 lb

## 2019-08-25 DIAGNOSIS — M79662 Pain in left lower leg: Secondary | ICD-10-CM

## 2019-08-25 DIAGNOSIS — R600 Localized edema: Secondary | ICD-10-CM | POA: Diagnosis not present

## 2019-08-25 DIAGNOSIS — M7989 Other specified soft tissue disorders: Secondary | ICD-10-CM

## 2019-08-25 NOTE — Progress Notes (Signed)
I have discussed the procedure for the virtual visit with the patient who has given consent to proceed with assessment and treatment.   [name], CMA     

## 2019-08-25 NOTE — Telephone Encounter (Signed)
Pt called into the office today upset about her visit with ER. She advised that she presented to ER on 08/22/19 with SOB, numbness in extremities, intermittent Chest pain, SOB,  and calf pain x 2 weeks.   Pt was transported to [facility] for imaging. Per the note Dr. Durward Fortes that pt did not complain of the above symptoms. They drew troponin but did not evaluate pt for a clot.   Pt states that she is still having calf pain, does not see any redness, and denies any warmth to the area, co-workers have advised that the area appears to be swollen. Pt states she cannot tell if the calf is hard to the  .  Pt would like to know what next step should be as the ER was more concerned about neurologic follow up and not a possible clot.

## 2019-08-25 NOTE — Progress Notes (Signed)
Virtual Visit via Video   I connected with patient on 08/25/19 at  3:30 PM EDT by a video enabled telemedicine application and verified that I am speaking with the correct person using two identifiers.  Location patient: Home Location provider: [facility], Office Persons participating in the virtual visit: Patient, Provider, [place] (Patina Moore)  I discussed the limitations of evaluation and management by telemedicine and the availability of in person appointments. The patient expressed understanding and agreed to proceed.  Subjective:   HPI:   Left calf pain x 2-3 weeks, no injury or fall. Pain starts in calf and radiates to posterior knee. Endorses swelling today only, mainly noted by coworkers. Denies erythema, warmth. No recent air travel, no prolonged sitting or bed rest. No personal history of clots, dad has history of thrombosis. Denies chest pain. Has history of intermittent SOB going on for several months which has been assessed.   ROS:   See pertinent positives and negatives per HPI.  Patient Active Problem List   Diagnosis Date Noted  . Flexural eczema 05/17/2018  . Eyelid dermatitis, allergic/contact 05/17/2018  . Medial epicondylitis, right 03/17/2018  . 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 Vitamin D deficiency 11/03/2017  . Shortened PR interval 09/27/2017  . Greater trochanteric bursitis of left hip 05/12/2017  . B12 deficiency 01/22/2017  . Memory changes 08/28/2016  . 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 Benign fundic gland polyps of stomach 02/12/2016  . Paresthesia 12/17/2015  . Bladder spasm 12/13/2014  . H/O adenomatous polyp of colon 10/04/2014  . Screening for malignant neoplasm of cervix 05/31/2013  . Physical exam 05/31/2013  . Left L3 lumbar radiculitis 09/23/2012  . Neck pain 08/22/2012  . Adrenal nodule ([place]) 03/02/2012  . Essential hypertension, benign 03/02/2012  . IBS (irritable bowel syndrome)   . PAC (premature atrial  contraction)   . Insomnia   . GERD (gastroesophageal reflux disease)   . DYSTHYMIA 09/30/2009  . HEADACHE 09/21/2008  . HEPATIC CYST 08/14/2008  . Hyperlipidemia 08/06/2008    Social History   Tobacco Use  . Smoking status: Never Smoker  . Smokeless tobacco: Never Used  Substance Use Topics  . Alcohol use: Yes    Alcohol/week: 1.0 standard drinks    Types: 1 Glasses of wine per week    Comment: occasional    Current Outpatient Medications:  .  Calcium Carbonate-Vit D-Min (CALCIUM 1200 PO), Take 1,200 Units by mouth daily., Disp: , Rfl:  .  cholecalciferol (VITAMIN D) 1000 UNITS tablet, Take 1,000 Units by mouth daily. , Disp: , Rfl:  .  Crisaborole (EUCRISA) 2 % OINT, Apply 1 application topically 2 (two) times daily., Disp: 60 g, Rfl: 1 .  diazepam (VALIUM) 5 MG tablet, Take 1 tablet (5 mg total) by mouth every 8 (eight) hours as needed (vertigo)., Disp: 8 tablet, Rfl: 0 .  losartan-hydrochlorothiazide (HYZAAR) 50-12.5 MG tablet, Take 1 tablet by mouth daily. Please schedule a follow up with Dr. Birdie Riddle for further refills [phone], Disp: 30 tablet, Rfl: 0 .  meclizine (ANTIVERT) 25 MG tablet, Take 25 mg by mouth 2 (two) times daily as needed for dizziness. , Disp: , Rfl:  .  OSCIMIN 0.125 MG SUBL, , Disp: , Rfl: 5 .  potassium chloride SA (K-DUR) 20 MEQ tablet, TAKE 1 TABLET BY MOUTH TWICE DAILY, Disp: 60 tablet, Rfl: 6 .  tiZANidine (ZANAFLEX) 4 MG tablet, Take 1 tablet as needed at bedtime for muscle spasms, Disp: 30 tablet, Rfl: 5 .  vitamin B-12 (CYANOCOBALAMIN) 500 MCG tablet, Take 500 mcg by mouth daily., Disp: , Rfl:   Allergies  Allergen Reactions  . Buspirone Other (See Comments)     nightmare  . Esomeprazole Other (See Comments)    headache  . Lansoprazole     Abdominal pain  . Omeprazole     Headache  . Sulfonamide Derivatives Rash     Rash    Objective:   Pulse (!) 101   Wt 131 lb (59.4 kg)   BMI 24.75 kg/m   Patient is well-developed,  well-nourished in no acute distress.  Resting comfortably at home.  Head is normocephalic, atraumatic.  No labored breathing.  Speech is clear and coherent with logical content.  Patient is alert and oriented at baseline.   Assessment and Plan:   1. Pain of left calf 2. Calf swelling Will obtain US to rule our DVT. Will treat based on findings. She is scheduled at [facility] in just a bit. If + will send to ER for CTA as well due to her history of chronic intermittent SOB.  Cell phone number given for call report. Strict ER precautions reviewed with patient.  - US Venous Img Lower Unilateral Left; Future    Leeanne Rio, PA-C 08/25/2019

## 2019-08-25 NOTE — Telephone Encounter (Signed)
Nicole Crosby has a same day appt at 3:30 that could be done virtually to discuss her sxs and order the appropriate imaging if needed

## 2019-08-25 NOTE — Telephone Encounter (Signed)
Called and spoke with pt she is able to do DOXY with [facility].

## 2019-08-28 MED FILL — POTASSIUM CHLORIDE CRYS ER: 20 | 30 days supply | Qty: 60 | Fill #4

## 2019-08-29 ENCOUNTER — Encounter: Payer: Self-pay | Admitting: Emergency Medicine

## 2019-08-29 DIAGNOSIS — H40019 Open angle with borderline findings, low risk, unspecified eye: Secondary | ICD-10-CM | POA: Diagnosis not present

## 2019-08-29 DIAGNOSIS — H524 Presbyopia: Secondary | ICD-10-CM | POA: Diagnosis not present

## 2019-08-29 MED FILL — LOTEMAX 0.5% GEL: 0.5 | 30 days supply | Qty: 10 | Fill #0

## 2019-08-30 ENCOUNTER — Ambulatory Visit: Payer: 59 | Admitting: Family Medicine

## 2019-09-05 ENCOUNTER — Other Ambulatory Visit: Payer: Self-pay | Admitting: Family Medicine

## 2019-09-05 MED FILL — LOSARTAN-HCTZ 50-12.5 MG TA: 50-12.5 | 30 days supply | Qty: 30 | Fill #0

## 2019-09-06 ENCOUNTER — Encounter: Payer: Self-pay | Admitting: Family Medicine

## 2019-09-13 ENCOUNTER — Ambulatory Visit: Payer: 59 | Admitting: Family Medicine

## 2019-09-20 ENCOUNTER — Encounter: Payer: Self-pay | Admitting: Family Medicine

## 2019-09-20 ENCOUNTER — Ambulatory Visit (INDEPENDENT_AMBULATORY_CARE_PROVIDER_SITE_OTHER): Payer: 59 | Admitting: Family Medicine

## 2019-09-20 ENCOUNTER — Other Ambulatory Visit: Payer: Self-pay

## 2019-09-20 VITALS — BP 130/84 | HR 69 | Temp 97.4°F | Resp 14 | Ht 61.0 in | Wt 134.1 lb

## 2019-09-20 DIAGNOSIS — E876 Hypokalemia: Secondary | ICD-10-CM | POA: Diagnosis not present

## 2019-09-20 DIAGNOSIS — E538 Deficiency of other specified B group vitamins: Secondary | ICD-10-CM

## 2019-09-20 DIAGNOSIS — I10 Essential (primary) hypertension: Secondary | ICD-10-CM

## 2019-09-20 DIAGNOSIS — R200 Anesthesia of skin: Secondary | ICD-10-CM

## 2019-09-20 LAB — BASIC METABOLIC PANEL
BUN: 11 mg/dL (ref 6–23)
CO2: 29 mEq/L (ref 19–32)
Calcium: 9.4 mg/dL (ref 8.4–10.5)
Chloride: 101 mEq/L (ref 96–112)
Creatinine, Ser: 0.52 mg/dL (ref 0.40–1.20)
GFR: 122.09 mL/min (ref >60.00)
Glucose, Bld: 81 mg/dL (ref 70–99)
Potassium: 4.4 mEq/L (ref 3.5–5.1)
Sodium: 139 mEq/L (ref 135–145)

## 2019-09-20 LAB — B12 AND FOLATE PANEL
Folate: 10.5 ng/mL (ref >5.9)
Vitamin B-12: 1051 pg/mL — ABNORMAL HIGH (ref 211–911)

## 2019-09-20 LAB — MAGNESIUM: Magnesium: 1.9 mg/dL (ref 1.5–2.5)

## 2019-09-20 NOTE — Assessment & Plan Note (Signed)
Pt has hx of this.  If her levels are again low this may explain some of her numbness/tingling.  Check labs and replete PRN.

## 2019-09-20 NOTE — Patient Instructions (Addendum)
Schedule your complete physical in 6 months We'll notify you of your lab results and make any changes if needed BP looks great!  No changes at this time Ask your dentist office about who they use for TMJ Call with any questions or concerns Stay Safe!  Stay Healthy! Happy Holidays!

## 2019-09-20 NOTE — Assessment & Plan Note (Signed)
Pt has hx of this.  On Kdur 7meq BID but K+ was low at recent ER visit.  Check BMP and Mag to determine if replacement tx is needed.  Pt expressed understanding and is in agreement w/ plan.

## 2019-09-20 NOTE — Progress Notes (Signed)
   Subjective:    Patient ID: Nicole Crosby, female    DOB: April 30, 1966, 53 y.o.   MRN: [id number]  HPI Hypokalemia- pt's K 3.2 in ER on 10/20.  On HCTZ 12.5mg  daily.  On Kdur 27meq BID- admits to missing pills 'here and there'.  HTN- chronic problem, on Losartan HCTZ 50/12.5mg  daily w/ good control.  Home BPs have been excellent.  No CP, SOB, HAs, visual changes, edema.  L facial numbness- pt had complete w/u in ER.  Imaging WNL.  Has f/u w/ Neuro on 11/30.  No CN deficit.  Sensation intact.  Saw UC and was told CN5 'was inflamed'.  Took steroid w/o relief.  When she went to ER, L arm was also numb but this has resolved.  On 1000 B12 daily.  Review of Systems For ROS see HPI     Objective:   Physical Exam Vitals signs reviewed.  Constitutional:      General: She is not in acute distress.    Appearance: Normal appearance. She is well-developed.  HENT:     Head: Normocephalic and atraumatic.  Eyes:     Conjunctiva/sclera: Conjunctivae normal.     Pupils: Pupils are equal, round, and reactive to light.  Neck:     Musculoskeletal: Normal range of motion and neck supple.     Thyroid: No thyromegaly.  Cardiovascular:     Rate and Rhythm: Normal rate and regular rhythm.     Heart sounds: Normal heart sounds. No murmur.  Pulmonary:     Effort: Pulmonary effort is normal. No respiratory distress.     Breath sounds: Normal breath sounds.  Abdominal:     General: There is no distension.     Palpations: Abdomen is soft.     Tenderness: There is no abdominal tenderness.  Lymphadenopathy:     Cervical: No cervical adenopathy.  Skin:    General: Skin is warm and dry.  Neurological:     General: No focal deficit present.     Mental Status: She is alert and oriented to person, place, and time.     Cranial Nerves: No cranial nerve deficit.     Sensory: No sensory deficit.  Psychiatric:        Behavior: Behavior normal.           Assessment & Plan:  Facial numbness- new to  provider, ongoing for pt.  Had ER w/u including MRI that was unrevealing.  She has appt upcoming w/ Neuro.  Will follow along.

## 2019-09-20 NOTE — Assessment & Plan Note (Signed)
Chronic problem.  Well controlled on current meds.  Check labs.  No anticipated med changes.  Will follow.

## 2019-09-25 MED FILL — POTASSIUM CHLORIDE CRYS ER: 20 | 30 days supply | Qty: 60 | Fill #5

## 2019-09-26 ENCOUNTER — Telehealth (INDEPENDENT_AMBULATORY_CARE_PROVIDER_SITE_OTHER): Payer: 59 | Admitting: Neurology

## 2019-09-26 ENCOUNTER — Encounter: Payer: Self-pay | Admitting: Neurology

## 2019-09-26 ENCOUNTER — Other Ambulatory Visit: Payer: Self-pay

## 2019-09-26 VITALS — Ht 61.0 in | Wt 134.0 lb

## 2019-09-26 DIAGNOSIS — M542 Cervicalgia: Secondary | ICD-10-CM

## 2019-09-26 DIAGNOSIS — R200 Anesthesia of skin: Secondary | ICD-10-CM

## 2019-09-26 MED ORDER — GABAPENTIN 100 MG PO CAPS: ORAL_CAPSULE | ORAL | 11 refills | Status: DC

## 2019-09-26 MED FILL — GABAPENTIN 100 MG CAPSULE: 100 | 34 days supply | Qty: 60 | Fill #0

## 2019-09-26 NOTE — Progress Notes (Signed)
Virtual Visit via Video Note The purpose of this virtual visit is to provide medical care while limiting exposure to the novel coronavirus.    Consent was obtained for video visit:  Yes.   Answered questions that patient had about telehealth interaction:  Yes.   I discussed the limitations, risks, security and privacy concerns of performing an evaluation and management service by telemedicine. I also discussed with the patient that there may be a patient responsible charge related to this service. The patient expressed understanding and agreed to proceed.  Pt location: Home Physician Location: office Name of referring provider:  Lennice Sites, DO I connected with Nicole Crosby at patients initiation/request on 09/26/2019 at  3:00 PM EST by video enabled telemedicine application and verified that I am speaking with the correct person using two identifiers. Pt MRN:  [id number] Pt DOB:  [birth year] Video Participants:  Nicole Crosby   History of Present Illness:  The patient had a virtual video visit on 09/26/2019. She was last seen in the neurology clinic 4 months ago for vertigo and neck pain. She saw Physical therapy with only slightly improved range of motion in her neck, she still has pain and hears a crack when she turns her head. The dizziness with head movements is better. She presents today for a new symptoms of left-sided numbness and paresthesias. On 10/13, she started having numbness on the left side of her jaw, then on 10/15, numbness spread to her left cheek and eye. She went to Urgent Care and was felt to have inflammation of the 5th cranial nerve and given a course of steroids. There was no improvement in symptoms, and on 10/20, she started having tingling in her left arm and leg so she went back to the ER and had an MRI brain with and without contrast which I personally reviewed, no acute changes seen. The tingling on her left arm and leg have resolved, she has occasional  tingling in her left hand when she wakes up. However, the numbness on her face continues to recur, some days it feels more numb than others. When she touches her face, it feels the same on both sides, but feels "dead." Her left eye also feels full, her eye doctor told her the eye looked inflamed and also gave her steroid eye drops for 2 weeks. She feels like her tongue is swollen. She has seen her dentist who has told her she grinds her teeth at night. No swallowing difficulties, no change in taste. She denies any clear headaches but continues to report a lot of neck pain. She tried Tizanidine but does not recall if it helped. She denies any head injuries. She fell twice in the grocery. She has had intermittent nasal congestion since April.   There is an MRI cervical spine from 2013 reporting mild cervical spondylosis and tiny central disc protrusion at C6-7 without resulting stenosis.   History on Initial Assessment 08/28/2016: This is a 53 yo RH woman with a history of hypertension, GERD, who presented for headaches, memory loss, and dizziness. She reports the headaches were occurring frequently when she was taking medications for her reflux (Nexium, Prilosec). With stopping these, the headaches have significantly improved. They only seldom occur. She usually now just takes TUMS or rarely prn Famotidine. She reports that she does not have vertigo, but more of a "loopy sensation" which occurs only when she is sitting down or reading. She would feel like she is on a boat  or like someone is trying to push her over. No associated headache, nausea/vomiting, focal symptoms. She would usually take a meclizine which seems to help. They do not seem to occur when she is supine or standing. She was having these symptoms 3-4 times a week in February, but this has improved as well. She had sensations of dysequilibrium when walking around 5 years ago and had vestibular therapy, but was told symptoms were not due to  vestibular dysfunction. She has noticed some changes in her hearing but missed her hearing test last April. No tinnitus. Her main concern today is the memory change that started in the Fall of last year, but worse in February 2017. It appears she had reported memory changes in 2015 and had an MRI brain with and without contrast which I personally reviewed, which was normal. She has noticed that her brain is not processing things correctly. She asked to see a neurologist after she forgot about a work appointment last August. She had noticed that when she was taking Prilosec, she was definitely more confused, forgetting coworkers names, her address, using her maiden name when she had been married for 20+ years. This did improved when she stopped Prilosec, but she continues to notice some changes. Co-workers and her daughter has mentioned as well that she would repeat herself. She denies getting lost driving, no missed bill payments or medications. She denies misplacing things frequently and has not left the stove on. She has occasional difficulties multitasking. She noticed she has to lean back on her car's head rest to "avoid being loopy." She had numbness and tingling in her hands and feet in February and was found to have a low B12 level. She is now on monthly injections, most recent B12 level in August 2017 was 402. Paresthesias are better. Her mother may have dementia. She denies any significant head injuries, she drinks alcohol occasionally.      Current Outpatient Medications on File Prior to Visit  Medication Sig Dispense Refill  . Calcium Carbonate-Vit D-Min (CALCIUM 1200 PO) Take 1,200 Units by mouth daily.    . cholecalciferol (VITAMIN D) 1000 UNITS tablet Take 2,000 Units by mouth daily.     Stasia Cavalier (EUCRISA) 2 % OINT Apply 1 application topically 2 (two) times daily. 60 g 1  . diazepam (VALIUM) 5 MG tablet Take 1 tablet (5 mg total) by mouth every 8 (eight) hours as needed (vertigo). 8  tablet 0  . losartan-hydrochlorothiazide (HYZAAR) 50-12.5 MG tablet TAKE 1 TABLET BY MOUTH DAILY. PLEASE SCHEDULE A FOLLOW UP WITH DR. TABORI FOR FURTHER REFILLS [phone] 30 tablet 0  . meclizine (ANTIVERT) 25 MG tablet Take 25 mg by mouth 2 (two) times daily as needed for dizziness.     . OSCIMIN 0.125 MG SUBL   5  . potassium chloride SA (K-DUR) 20 MEQ tablet TAKE 1 TABLET BY MOUTH TWICE DAILY 60 tablet 6  . tiZANidine (ZANAFLEX) 4 MG tablet Take 1 tablet as needed at bedtime for muscle spasms 30 tablet 5  . vitamin B-12 (CYANOCOBALAMIN) 500 MCG tablet Take 500 mcg by mouth daily.     No current facility-administered medications on file prior to visit.      Observations/Objective:   Vitals:   09/26/19 1455  Weight: 134 lb (60.8 kg)  Height: 5\' 1"  (1.549 m)   GEN:  The patient appears stated age and is in NAD. Neurological examination: Patient is awake, alert, oriented x 3. No aphasia or dysarthria. Intact fluency and  comprehension. Remote and recent memory intact. Able to name and repeat. Cranial nerves: Extraocular movements intact with no nystagmus. No facial asymmetry. Motor: moves all extremities symmetrically, at least anti-gravity x 4. No incoordination on finger to nose testing. Gait: narrow-based and steady, able to tandem walk adequately. Negative Romberg test.  Assessment and Plan:   This is a 53 yo RH woman with a history of hypertension, B12 deficiency, GERD, seen initially for headaches, dysequilibrium, and memory changes. Her headaches had resolved after stopping reflux medication. Neuropsychological testing showed normal cognition, with cognitive complaints likely due to underlying stress. She presents with new symptoms of left facial numbness, prior to this she had an episode of left-sided paresthesias. MRI brain normal. Etiology of left facial numbness unclear, she continues to report neck pain, cervical MRI without contrast will be ordered to assess for high cervical  lesion causing facial symptoms. We discussed starting symptomatic treatment with gabapentin, side effects discussed, she agrees to start gabapentin 100mg  qhs x 1 week, then increase to 200mg  qhs. Follow-up in 6 months, she knows to call for any changes.   Follow Up Instructions:   -I discussed the assessment and treatment plan with the patient. The patient was provided an opportunity to ask questions and all were answered. The patient agreed with the plan and demonstrated an understanding of the instructions.   The patient was advised to call back or seek an in-person evaluation if the symptoms worsen or if the condition fails to improve as anticipated.    Cameron Sprang, MD

## 2019-10-01 ENCOUNTER — Other Ambulatory Visit: Payer: Self-pay

## 2019-10-01 ENCOUNTER — Ambulatory Visit (INDEPENDENT_AMBULATORY_CARE_PROVIDER_SITE_OTHER): Payer: 59

## 2019-10-01 DIAGNOSIS — M50222 Other cervical disc displacement at C5-C6 level: Secondary | ICD-10-CM | POA: Diagnosis not present

## 2019-10-01 DIAGNOSIS — R200 Anesthesia of skin: Secondary | ICD-10-CM | POA: Diagnosis not present

## 2019-10-01 DIAGNOSIS — M542 Cervicalgia: Secondary | ICD-10-CM

## 2019-10-01 DIAGNOSIS — M47812 Spondylosis without myelopathy or radiculopathy, cervical region: Secondary | ICD-10-CM | POA: Diagnosis not present

## 2019-10-01 DIAGNOSIS — M50223 Other cervical disc displacement at C6-C7 level: Secondary | ICD-10-CM | POA: Diagnosis not present

## 2019-10-02 ENCOUNTER — Telehealth: Payer: 59 | Admitting: Neurology

## 2019-10-02 ENCOUNTER — Ambulatory Visit: Payer: 59 | Admitting: Neurology

## 2019-10-04 ENCOUNTER — Telehealth: Payer: Self-pay

## 2019-10-04 NOTE — Telephone Encounter (Signed)
Left message for patient with results and recommendations and ask she let us know how she would like to proceed.

## 2019-10-04 NOTE — Telephone Encounter (Signed)
-----   Message from Cameron Sprang, MD sent at 10/03/2019  4:59 PM EST ----- Pls let her know the MRI cervical spine showed slight progression of arthritis changes since 2013, with pinching of the nerves on the right side. At this point since no improvement with PT, would recommend Ortho evaluation. If she is agreeable, pls send referral to [place], thanks!

## 2019-10-06 ENCOUNTER — Other Ambulatory Visit: Payer: Self-pay

## 2019-10-06 ENCOUNTER — Telehealth: Payer: Self-pay

## 2019-10-06 DIAGNOSIS — R200 Anesthesia of skin: Secondary | ICD-10-CM

## 2019-10-06 DIAGNOSIS — M47812 Spondylosis without myelopathy or radiculopathy, cervical region: Secondary | ICD-10-CM

## 2019-10-06 NOTE — Telephone Encounter (Signed)
-----   Message from Cameron Sprang, MD sent at 10/03/2019  4:59 PM EST ----- Pls let her know the MRI cervical spine showed slight progression of arthritis changes since 2013, with pinching of the nerves on the right side. At this point since no improvement with PT, would recommend Ortho evaluation. If she is agreeable, pls send referral to [place] [facility], thanks!

## 2019-10-06 NOTE — Telephone Encounter (Signed)
Left message for patient regarding results and how she would like to proceed.

## 2019-10-07 ENCOUNTER — Emergency Department (INDEPENDENT_AMBULATORY_CARE_PROVIDER_SITE_OTHER)
Admission: EM | Admit: 2019-10-07 | Discharge: 2019-10-07 | Disposition: A | Discharge status: Home / Self Care | Payer: 59 | Source: Home / Self Care | Attending: Family Medicine | Admitting: Family Medicine

## 2019-10-07 ENCOUNTER — Other Ambulatory Visit: Payer: Self-pay

## 2019-10-07 DIAGNOSIS — Z20828 Contact with and (suspected) exposure to other viral communicable diseases: Secondary | ICD-10-CM | POA: Diagnosis not present

## 2019-10-07 DIAGNOSIS — Z20822 Contact with and (suspected) exposure to covid-19: Secondary | ICD-10-CM

## 2019-10-07 DIAGNOSIS — J029 Acute pharyngitis, unspecified: Secondary | ICD-10-CM

## 2019-10-07 DIAGNOSIS — R0981 Nasal congestion: Secondary | ICD-10-CM | POA: Diagnosis not present

## 2019-10-07 LAB — POC SARS CORONAVIRUS 2 AG -  ED: SARS Coronavirus 2 Ag: NEGATIVE

## 2019-10-07 NOTE — Discharge Instructions (Addendum)
Isolate yourself until COVID-19 test result is available.   If COVID-19 test is positive, isolate yourself until the below conditions are met: 1)  At least 7 days since symptoms onset. AND 2)  > 72 hours after symptom resolution (absence of fever without the use of fever-reducing medicine, and improvement in respiratory symptoms.

## 2019-10-07 NOTE — ED Provider Notes (Signed)
[name]    CSN: [id number] Arrival date & time: 10/07/19  1022      History   Chief Complaint Chief Complaint  Patient presents with  . Covid Exposure  . Nasal Congestion    HPI Nicole Crosby is a 53 y.o. female.   Patient has a history of chronically recurring mild sinus congestion and sore throat which has not changed recently.  She feels well otherwise.   She denies chest tightness, shortness of breath, and changes in taste/smell.  She presents for COVID19 testing because her son just tested positive.    The history is provided by the patient.    Past Medical History:  Diagnosis Date  . Allergy    Buspirone, Sulfa, Pantoprazole  . Benign fundic gland polyps of stomach 02/12/2016  . Chicken pox   . DYSTHYMIA 09/30/2009  . Functional dyspepsia   . GERD (gastroesophageal reflux disease)   . H/O adenomatous polyp of colon 10/04/2014   09/2014 - 3 mm adenoma - repeat colonoscopy 2022  . Hyperlipidemia    no meds  . Hypertension    pt denies  . IBS (irritable bowel syndrome)   . Insomnia   . MEDIAL EPICONDYLITIS, RIGHT 07/17/2010  . 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 Other abnormal glucose    Tiny hypodensity left hepatic lobe-CT of abdomen and pelvis 0/08/711-[id number] 6m periumblical hernia  . PAC (premature atrial contraction)   . Palpitations     Patient Active Problem List   Diagnosis Date Noted  . Hypokalemia 09/20/2019  . Flexural eczema 05/17/2018  . Eyelid dermatitis, allergic/contact 05/17/2018  . Medial epicondylitis, right 03/17/2018  . 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 Vitamin D deficiency 11/03/2017  . Shortened PR interval 09/27/2017  . Greater trochanteric bursitis of left hip 05/12/2017  . B12 deficiency 01/22/2017  . Memory changes 08/28/2016  . 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 Benign fundic gland polyps of stomach 02/12/2016  . Paresthesia 12/17/2015  . Bladder spasm 12/13/2014  . H/O adenomatous polyp of  colon 10/04/2014  . Screening for malignant neoplasm of cervix 05/31/2013  . Physical exam 05/31/2013  . Left L3 lumbar radiculitis 09/23/2012  . Neck pain 08/22/2012  . Adrenal nodule ([place] 03/02/2012  . Essential hypertension, benign 03/02/2012  . IBS (irritable bowel syndrome)   . PAC (premature atrial contraction)   . Insomnia   . GERD (gastroesophageal reflux disease)   . DYSTHYMIA 09/30/2009  . HEADACHE 09/21/2008  . HEPATIC CYST 08/14/2008  . Hyperlipidemia 08/06/2008    Past Surgical History:  Procedure Laterality Date  . CESAREAN SECTION  01/2001  . COLONOSCOPY    . DE QUERVAIN'S RELEASE  2000   Right wrist area    OB History    Gravida  2   Para  2   Term      Preterm      AB      Living        SAB      TAB      Ectopic      Multiple      Live Births               Home Medications    Prior to Admission medications   Medication Sig Start Date End Date Taking? Authorizing Provider  Calcium Carbonate-Vit D-Min (CALCIUM 1200 PO) Take 1,200 Units by mouth daily.    [provider]  cholecalciferol (VITAMIN D) 1000 UNITS tablet Take 2,000 Units by  mouth daily.     [provider]  Crisaborole (EUCRISA) 2 % OINT Apply 1 application topically 2 (two) times daily. 05/04/18   Midge Minium, MD  diazepam (VALIUM) 5 MG tablet Take 1 tablet (5 mg total) by mouth every 8 (eight) hours as needed (vertigo). 12/19/17   Carlisle Cater, PA-C  gabapentin (NEURONTIN) 100 MG capsule Take 1 capsule every night for 1 week, then increase to 2 capsules every night 09/26/19   Cameron Sprang, MD  losartan-hydrochlorothiazide (HYZAAR) 50-12.5 MG tablet TAKE 1 TABLET BY MOUTH DAILY. PLEASE SCHEDULE A FOLLOW UP WITH DR. TABORI FOR FURTHER REFILLS [phone] 09/05/19   Midge Minium, MD  meclizine (ANTIVERT) 25 MG tablet Take 25 mg by mouth 2 (two) times daily as needed for dizziness.     [provider]  OSCIMIN 0.125 MG SUBL  12/16/17    [provider]  potassium chloride SA (K-DUR) 20 MEQ tablet TAKE 1 TABLET BY MOUTH TWICE DAILY 04/03/19   Midge Minium, MD  tiZANidine (ZANAFLEX) 4 MG tablet Take 1 tablet as needed at bedtime for muscle spasms 05/16/19   Cameron Sprang, MD  vitamin B-12 (CYANOCOBALAMIN) 500 MCG tablet Take 500 mcg by mouth daily.    [provider]    Family History Family History  Problem Relation Age of Onset  . Hypertension Mother   . Hyperlipidemia Mother   . Fibrocystic breast disease Mother   . Hearing loss Mother   . Osteopenia Mother   . Prostate cancer Father 48       deceased  . Hypertension Father   . Diabetes Father   . Kidney disease Father   . Heart disease Father        MI  . Hyperlipidemia Father   . Other Brother        unknown  . Thyroid disease Sister   . Diabetes Sister        pre-diabetes  . Hypertension Sister   . Arthritis Sister   . Diabetes Sister   . Hypertension Sister   . Hyperlipidemia Sister   . Fibrocystic breast disease Sister   . Colon cancer Maternal Uncle        died in late 46's  . Diabetes Maternal Grandmother   . Prostate cancer Maternal Grandfather   . COPD Maternal Grandfather   . Hyperlipidemia Sister   . Hypertension Sister   . Rectal cancer Neg Hx   . Stomach cancer Neg Hx   . Esophageal cancer Neg Hx   . Pancreatic cancer Neg Hx     Social History Social History   Tobacco Use  . Smoking status: Never Smoker  . Smokeless tobacco: Never Used  Substance Use Topics  . Alcohol use: Yes    Alcohol/week: 1.0 standard drinks    Types: 1 Glasses of wine per week    Comment: occasional  . Drug use: No     Allergies   Buspirone, Esomeprazole, Lansoprazole, Omeprazole, and Sulfonamide derivatives   Review of Systems Review of Systems + intermittent sore throat No cough No pleuritic pain No wheezing + nasal congestion + post-nasal drainage No sinus pain/pressure No itchy/red eyes No earache No  hemoptysis No SOB No fever/chills No nausea No vomiting No abdominal pain No diarrhea No urinary symptoms No skin rash No fatigue No myalgias No headache   Physical Exam Triage Vital Signs ED Triage Vitals [10/07/19 1046]  Enc Vitals Group     BP 107/77  Pulse Rate 93     Resp 16     Temp 98.8 F (37.1 C)     Temp Source Oral     SpO2 99 %     Weight 132 lb 4.4 oz (60 kg)     Height _0  (1.549 m)     Head Circumference      Peak Flow      Pain Score 0     Pain Loc      Pain Edu?      Excl. in [place]?    No data found.  Updated Vital Signs BP 107/77 (BP Location: Left Arm)   Pulse 93   Temp 98.8 F (37.1 C) (Oral)   Resp 16   Ht _1  (1.549 m)   Wt 60 kg   SpO2 99%   BMI 24.99 kg/m   Visual Acuity Right Eye Distance:   Left Eye Distance:   Bilateral Distance:    Right Eye Near:   Left Eye Near:    Bilateral Near:     Physical Exam Nursing notes and Vital Signs reviewed. Appearance:  Patient appears stated age, and in no acute distress Eyes:  Pupils are equal, round, and reactive to light and accomodation.  Extraocular movement is intact.  Conjunctivae are not inflamed  Ears:  Canals normal.  Tympanic membranes normal.  Nose:  Mildly congested turbinates.  No sinus tenderness.  Pharynx:  Normal Neck:  Supple.  Lateral nodes are mildly tender to palpation on the left.   Lungs:  Clear to auscultation.  Breath sounds are equal.  Moving air well. Heart:  Regular rate and rhythm without murmurs, rubs, or gallops.  Abdomen:  Nontender without masses or hepatosplenomegaly.  Bowel sounds are present.  No CVA or flank tenderness.  Extremities:  No edema.  Skin:  No rash present.   UC Treatments / Results  Labs (all labs ordered are listed, but only abnormal results are displayed) Labs Reviewed  NOVEL CORONAVIRUS, NAA  POC SARS CORONAVIRUS 2 AG -  ED negative    EKG   Radiology No results found.  Procedures Procedures (including critical care  time)  Medications Ordered in UC Medications - No data to display  Initial Impression / Assessment and Plan / UC Course  I have reviewed the triage vital signs and the nursing notes.  Pertinent labs & imaging results that were available during my care of the patient were reviewed by me and considered in my medical decision making (see chart for details).    Benign exam.  COVID19 send out Followup with Family Doctor if increased symptoms develop  Final Clinical Impressions(s) / UC Diagnoses   Final diagnoses:  Close exposure to COVID-19 virus     Discharge Instructions     Isolate yourself until COVID-19 test result is available.   If COVID-19 test is positive, isolate yourself until the below conditions are met: 1)  At least 7 days since symptoms onset. AND 2)  > 72 hours after symptom resolution (absence of fever without the use of fever-reducing medicine, and improvement in respiratory symptoms.        ED Prescriptions    None        Kandra Nicolas, MD 10/13/19 1017

## 2019-10-07 NOTE — ED Triage Notes (Signed)
Pt here today for covid test. Experiencing sinus congestion since yesterday as well as throat swelling. Son tested pos on Wed for [place]. Pt is a Furniture conservator/restorer.

## 2019-10-10 LAB — NOVEL CORONAVIRUS, NAA: SARS-CoV-2, NAA: NOT DETECTED

## 2019-10-16 ENCOUNTER — Other Ambulatory Visit: Payer: Self-pay | Admitting: Family Medicine

## 2019-10-16 MED FILL — LOSARTAN-HCTZ 50-12.5 MG TA: 50-12.5 | 30 days supply | Qty: 30 | Fill #0

## 2019-10-19 ENCOUNTER — Ambulatory Visit (INDEPENDENT_AMBULATORY_CARE_PROVIDER_SITE_OTHER): Payer: 59

## 2019-10-19 ENCOUNTER — Other Ambulatory Visit: Payer: Self-pay | Admitting: Family Medicine

## 2019-10-19 ENCOUNTER — Other Ambulatory Visit: Payer: Self-pay

## 2019-10-19 DIAGNOSIS — Z1239 Encounter for other screening for malignant neoplasm of breast: Secondary | ICD-10-CM | POA: Diagnosis not present

## 2019-10-19 DIAGNOSIS — Z1231 Encounter for screening mammogram for malignant neoplasm of breast: Secondary | ICD-10-CM | POA: Diagnosis not present

## 2019-10-30 MED FILL — POTASSIUM CHLORIDE CRYS ER: 20 | 30 days supply | Qty: 60 | Fill #6

## 2019-11-02 ENCOUNTER — Encounter: Payer: Self-pay | Admitting: Internal Medicine

## 2019-11-06 ENCOUNTER — Other Ambulatory Visit: Payer: Self-pay | Admitting: Family Medicine

## 2019-11-06 MED FILL — GABAPENTIN 100 MG CAPSULE: 100 | 34 days supply | Qty: 60 | Fill #1

## 2019-11-09 MED FILL — LOSARTAN-HCTZ 50-12.5 MG TA: 50-12.5 | 30 days supply | Qty: 30 | Fill #0

## 2019-12-04 ENCOUNTER — Other Ambulatory Visit: Payer: Self-pay | Admitting: Family Medicine

## 2019-12-04 MED FILL — POTASSIUM CHLORIDE CRYS ER: 20 | 30 days supply | Qty: 60 | Fill #0

## 2019-12-07 ENCOUNTER — Encounter (HOSPITAL_BASED_OUTPATIENT_CLINIC_OR_DEPARTMENT_OTHER): Payer: Self-pay | Admitting: *Deleted

## 2019-12-07 ENCOUNTER — Emergency Department (HOSPITAL_BASED_OUTPATIENT_CLINIC_OR_DEPARTMENT_OTHER): Payer: 59

## 2019-12-07 ENCOUNTER — Emergency Department (HOSPITAL_BASED_OUTPATIENT_CLINIC_OR_DEPARTMENT_OTHER)
Admission: EM | Admit: 2019-12-07 | Discharge: 2019-12-07 | Disposition: A | Discharge status: Home / Self Care | Payer: 59 | Attending: Emergency Medicine | Admitting: Emergency Medicine

## 2019-12-07 ENCOUNTER — Other Ambulatory Visit: Payer: Self-pay

## 2019-12-07 DIAGNOSIS — Z79899 Other long term (current) drug therapy: Secondary | ICD-10-CM | POA: Insufficient documentation

## 2019-12-07 DIAGNOSIS — R1013 Epigastric pain: Secondary | ICD-10-CM | POA: Insufficient documentation

## 2019-12-07 DIAGNOSIS — R6883 Chills (without fever): Secondary | ICD-10-CM | POA: Diagnosis not present

## 2019-12-07 DIAGNOSIS — Z888 Allergy status to other drugs, medicaments and biological substances status: Secondary | ICD-10-CM | POA: Insufficient documentation

## 2019-12-07 DIAGNOSIS — E785 Hyperlipidemia, unspecified: Secondary | ICD-10-CM | POA: Diagnosis not present

## 2019-12-07 DIAGNOSIS — Z882 Allergy status to sulfonamides status: Secondary | ICD-10-CM | POA: Insufficient documentation

## 2019-12-07 DIAGNOSIS — Z20822 Contact with and (suspected) exposure to covid-19: Secondary | ICD-10-CM | POA: Insufficient documentation

## 2019-12-07 DIAGNOSIS — R112 Nausea with vomiting, unspecified: Secondary | ICD-10-CM | POA: Diagnosis not present

## 2019-12-07 DIAGNOSIS — R000 Tachycardia, unspecified: Secondary | ICD-10-CM | POA: Diagnosis not present

## 2019-12-07 LAB — CBC WITH DIFFERENTIAL/PLATELET
Abs Immature Granulocytes: 0.03 10*3/uL (ref 0.00–0.07)
Basophils Absolute: 0 10*3/uL (ref 0.0–0.1)
Basophils Relative: 0 %
Eosinophils Absolute: 0 10*3/uL (ref 0.0–0.5)
Eosinophils Relative: 0 %
HCT: 46.6 % — ABNORMAL HIGH (ref 36.0–46.0)
Hemoglobin: 15 g/dL (ref 12.0–15.0)
Immature Granulocytes: 0 %
Lymphocytes Relative: 3 %
Lymphs Abs: 0.4 10*3/uL — ABNORMAL LOW (ref 0.7–4.0)
MCH: 29.9 pg (ref 26.0–34.0)
MCHC: 32.2 g/dL (ref 30.0–36.0)
MCV: 93 fL (ref 80.0–100.0)
Monocytes Absolute: 0.3 10*3/uL (ref 0.1–1.0)
Monocytes Relative: 2 %
Neutro Abs: 11.9 10*3/uL — ABNORMAL HIGH (ref 1.7–7.7)
Neutrophils Relative %: 95 %
Platelets: 265 10*3/uL (ref 150–400)
RBC: 5.01 MIL/uL (ref 3.87–5.11)
RDW: 12.2 % (ref 11.5–15.5)
WBC: 12.7 10*3/uL — ABNORMAL HIGH (ref 4.0–10.5)
nRBC: 0 % (ref 0.0–0.2)

## 2019-12-07 LAB — URINALYSIS, ROUTINE W REFLEX MICROSCOPIC
Bilirubin Urine: NEGATIVE
Glucose, UA: NEGATIVE mg/dL
Ketones, ur: NEGATIVE mg/dL
Leukocytes,Ua: NEGATIVE
Nitrite: NEGATIVE
Protein, ur: NEGATIVE mg/dL
Specific Gravity, Urine: 1.02 (ref 1.005–1.030)
pH: 8 (ref 5.0–8.0)

## 2019-12-07 LAB — SARS CORONAVIRUS 2 AG (30 MIN TAT): SARS Coronavirus 2 Ag: NEGATIVE

## 2019-12-07 LAB — LIPASE, BLOOD: Lipase: 27 U/L (ref 11–51)

## 2019-12-07 LAB — COMPREHENSIVE METABOLIC PANEL
ALT: 23 U/L (ref 0–44)
AST: 24 U/L (ref 15–41)
Albumin: 4.4 g/dL (ref 3.5–5.0)
Alkaline Phosphatase: 76 U/L (ref 38–126)
Anion gap: 8 (ref 5–15)
BUN: 13 mg/dL (ref 6–20)
CO2: 29 mmol/L (ref 22–32)
Calcium: 9.2 mg/dL (ref 8.9–10.3)
Chloride: 100 mmol/L (ref 98–111)
Creatinine, Ser: 0.53 mg/dL (ref 0.44–1.00)
GFR calc Af Amer: >60 mL/min (ref >60)
GFR calc non Af Amer: >60 mL/min (ref >60)
Glucose, Bld: 110 mg/dL — ABNORMAL HIGH (ref 70–99)
Potassium: 3.5 mmol/L (ref 3.5–5.1)
Sodium: 137 mmol/L (ref 135–145)
Total Bilirubin: 0.6 mg/dL (ref 0.3–1.2)
Total Protein: 8.2 g/dL — ABNORMAL HIGH (ref 6.5–8.1)

## 2019-12-07 LAB — URINALYSIS, MICROSCOPIC (REFLEX)

## 2019-12-07 LAB — SARS CORONAVIRUS 2 (TAT 6-24 HRS): SARS Coronavirus 2: NEGATIVE

## 2019-12-07 MED ORDER — SODIUM CHLORIDE 0.9 % IV BOLUS
500.0000 mL | Freq: Once | INTRAVENOUS | Status: AC
  Administered 2019-12-07: 500 mL via INTRAVENOUS

## 2019-12-07 MED ORDER — PROMETHAZINE HCL 25 MG/ML IJ SOLN
12.5000 mg | Freq: Once | INTRAMUSCULAR | Status: AC
  Administered 2019-12-07: 12.5 mg via INTRAVENOUS
  Filled 2019-12-07: qty 1

## 2019-12-07 MED ORDER — ONDANSETRON 4 MG PO TBDP: 4.0000 mg | ORAL_TABLET | Freq: Three times a day (TID) | ORAL | 0 refills | Status: DC | PRN

## 2019-12-07 MED ORDER — ONDANSETRON HCL 4 MG/2ML IJ SOLN
4.0000 mg | Freq: Once | INTRAMUSCULAR | Status: AC
  Administered 2019-12-07: 4 mg via INTRAVENOUS
  Filled 2019-12-07: qty 2

## 2019-12-07 MED FILL — ONDANSETRON ODT 4 MG TABLET: 4 | 7 days supply | Qty: 20 | Fill #0

## 2019-12-07 NOTE — ED Provider Notes (Signed)
[facility]ERGENC[facility] DEPARTMENT Provider Note   CSN: [id number] Arrival date & time: 12/07/19  [id number]     History Chief Complaint  Patient presents with  . Emesis  . Chills    Nicole Crosby is a 54 y.o. female with a past medical history significant for hyperlipidemia, IBS, GERD, and hypertension who presents to the ED due to 4 episodes of nonbloody, nonbilious emesis associated with generalized abdominal pain and chills that started this morning around 2:30 AM.  Patient admits to generalized abdominal pain most significant around the umbilicus. Patient admits to a similar episode like this one week ago that resolved on its own. Patient denies diarrhea. She denies sick contact and COVID exposures, but she is an employee here that works in [employer], so is exposed daily. She has received her first COVID vaccine and has her 2nd scheduled for tomorrow. Patient denies recent antibiotic usage and undercooked foods. Denies vaginal and urinary symptoms. She has tried a Valium and hyoscyamine sulfate with no relief. Patient denies fever, chest pain, shortness of breath, cough, and URI symptoms. She denies marijuana usage.     Past Medical History:  Diagnosis Date  . Allergy    Buspirone, Sulfa, Pantoprazole  . Benign fundic gland polyps of stomach 02/12/2016  . Chicken pox   . DYSTHYMIA 09/30/2009  . Functional dyspepsia   . GERD (gastroesophageal reflux disease)   . H/O adenomatous polyp of colon 10/04/2014   09/2014 - 3 mm adenoma - repeat colonoscopy 2022  . Hyperlipidemia    no meds  . Hypertension    pt denies  . IBS (irritable bowel syndrome)   . Insomnia   . MEDIAL EPICONDYLITIS, RIGHT 07/17/2010  . 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 Other abnormal glucose    Tiny hypodensity left hepatic lobe-CT of abdomen and pelvis 99991111 41mm periumblical hernia  . PAC (premature atrial contraction)   . Palpitations     Patient Active Problem List   Diagnosis Date Noted  .  Hypokalemia 09/20/2019  . Flexural eczema 05/17/2018  . Eyelid dermatitis, allergic/contact 05/17/2018  . Medial epicondylitis, right 03/17/2018  . 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 Vitamin D deficiency 11/03/2017  . Shortened PR interval 09/27/2017  . Greater trochanteric bursitis of left hip 05/12/2017  . B12 deficiency 01/22/2017  . Memory changes 08/28/2016  . 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 Benign fundic gland polyps of stomach 02/12/2016  . Paresthesia 12/17/2015  . Bladder spasm 12/13/2014  . H/O adenomatous polyp of colon 10/04/2014  . Screening for malignant neoplasm of cervix 05/31/2013  . Physical exam 05/31/2013  . Left L3 lumbar radiculitis 09/23/2012  . Neck pain 08/22/2012  . Adrenal nodule ([place]) 03/02/2012  . Essential hypertension, benign 03/02/2012  . IBS (irritable bowel syndrome)   . PAC (premature atrial contraction)   . Insomnia   . GERD (gastroesophageal reflux disease)   . DYSTHYMIA 09/30/2009  . HEADACHE 09/21/2008  . HEPATIC CYST 08/14/2008  . Hyperlipidemia 08/06/2008    Past Surgical History:  Procedure Laterality Date  . CESAREAN SECTION  01/2001  . COLONOSCOPY    . DE QUERVAIN'S RELEASE  2000   Right wrist area     OB History    Gravida  2   Para  2   Term      Preterm      AB      Living        SAB      TAB  Ectopic      Multiple      Live Births              Family History  Problem Relation Age of Onset  . Hypertension Mother   . Hyperlipidemia Mother   . Fibrocystic breast disease Mother   . Hearing loss Mother   . Osteopenia Mother   . Prostate cancer Father 33       deceased  . Hypertension Father   . Diabetes Father   . Kidney disease Father   . Heart disease Father        MI  . Hyperlipidemia Father   . Other Brother        unknown  . Thyroid disease Sister   . Diabetes Sister        pre-diabetes  . Hypertension Sister   . Arthritis Sister   . Diabetes Sister   .  Hypertension Sister   . Hyperlipidemia Sister   . Fibrocystic breast disease Sister   . Colon cancer Maternal Uncle        died in late 81's  . Diabetes Maternal Grandmother   . Prostate cancer Maternal Grandfather   . COPD Maternal Grandfather   . Hyperlipidemia Sister   . Hypertension Sister   . Rectal cancer Neg Hx   . Stomach cancer Neg Hx   . Esophageal cancer Neg Hx   . Pancreatic cancer Neg Hx     Social History   Tobacco Use  . Smoking status: Never Smoker  . Smokeless tobacco: Never Used  Substance Use Topics  . Alcohol use: Yes    Alcohol/week: 1.0 standard drinks    Types: 1 Glasses of wine per week    Comment: occasional  . Drug use: No    Home Medications Prior to Admission medications   Medication Sig Start Date End Date Taking? Authorizing Provider  Calcium Carbonate-Vit D-Min (CALCIUM 1200 PO) Take 1,200 Units by mouth daily.   Yes [provider]  cholecalciferol (VITAMIN D) 1000 UNITS tablet Take 2,000 Units by mouth daily.    Yes [provider]  diazepam (VALIUM) 5 MG tablet Take 1 tablet (5 mg total) by mouth every 8 (eight) hours as needed (vertigo). 12/19/17  Yes Carlisle Cater, PA-C  gabapentin (NEURONTIN) 100 MG capsule Take 1 capsule every night for 1 week, then increase to 2 capsules every night 09/26/19  Yes Cameron Sprang, MD  Hyoscyamine Sulfate SL 0.125 MG SUBL Place 0.125 mg under the tongue.   Yes [provider]  losartan-hydrochlorothiazide (HYZAAR) 50-12.5 MG tablet TAKE 1 TABLET BY MOUTH DAILY. PLEASE SCHEDULE A FOLLOW UP WITH DR. TABORI FOR FURTHER REFILLS [phone] 11/06/19  Yes Midge Minium, MD  potassium chloride SA (KLOR-CON) 20 MEQ tablet TAKE 1 TABLET BY MOUTH TWICE DAILY 12/04/19  Yes Midge Minium, MD  vitamin B-12 (CYANOCOBALAMIN) 500 MCG tablet Take 500 mcg by mouth daily.   Yes [provider]  Crisaborole (EUCRISA) 2 % OINT Apply 1 application topically 2 (two) times daily.  05/04/18   Midge Minium, MD  meclizine (ANTIVERT) 25 MG tablet Take 25 mg by mouth 2 (two) times daily as needed for dizziness.     [provider]  ondansetron (ZOFRAN ODT) 4 MG disintegrating tablet Take 1 tablet (4 mg total) by mouth every 8 (eight) hours as needed for nausea or vomiting. 12/07/19   Suzy Bouchard, PA-C  OSCIMIN 0.125 MG SUBL  12/16/17   [provider]  tiZANidine (ZANAFLEX) 4 MG tablet Take 1 tablet as needed at bedtime for muscle spasms 05/16/19   Cameron Sprang, MD    Allergies    Buspirone, Esomeprazole, Lansoprazole, Omeprazole, and Sulfonamide derivatives  Review of Systems   Review of Systems  Constitutional: Positive for chills. Negative for fever.  HENT: Negative.   Respiratory: Negative for cough and shortness of breath.   Cardiovascular: Negative for chest pain.  Gastrointestinal: Positive for abdominal pain, nausea and vomiting. Negative for diarrhea.  Genitourinary: Negative for dysuria and vaginal discharge.  All other systems reviewed and are negative.   Physical Exam Updated Vital Signs BP 127/79   Pulse (!) 107   Temp 98.1 F (36.7 C) (Oral)   Resp 15   Ht 5\' 1"  (1.549 m)   Wt 60.8 kg   SpO2 98%   BMI 25.32 kg/m   Physical Exam Vitals and nursing note reviewed.  Constitutional:      General: She is not in acute distress.    Appearance: She is not toxic-appearing.  HENT:     Head: Normocephalic.  Eyes:     Conjunctiva/sclera: Conjunctivae normal.  Cardiovascular:     Rate and Rhythm: Normal rate and regular rhythm.     Pulses: Normal pulses.     Heart sounds: Normal heart sounds. No murmur. No friction rub. No gallop.   Pulmonary:     Effort: Pulmonary effort is normal.     Breath sounds: Normal breath sounds.  Abdominal:     General: Abdomen is flat. There is no distension.     Palpations: Abdomen is soft.     Tenderness: There is abdominal tenderness. There is no right CVA tenderness, left CVA  tenderness, guarding or rebound.     Comments: Hyperactive BS in bilateral lower quadrants. Diffuse tenderness to palpation most significant in epigastric region. Negative Murphy's sign. No tenderness at McBurney's point. Negative CVA tenderness bilaterally. No rebound or guarding.   Musculoskeletal:     Cervical back: Neck supple.     Comments: Able to move all 4 extremities without difficulty. No lower extremity edema.   Skin:    General: Skin is warm and dry.  Neurological:     General: No focal deficit present.     Mental Status: She is alert.  Psychiatric:        Mood and Affect: Mood normal.        Behavior: Behavior normal.     ED Results / Procedures / Treatments   Labs (all labs ordered are listed, but only abnormal results are displayed) Labs Reviewed  CBC WITH DIFFERENTIAL/PLATELET - Abnormal; Notable for the following components:      Result Value   WBC 12.7 (*)    HCT 46.6 (*)    Neutro Abs 11.9 (*)    Lymphs Abs 0.4 (*)    All other components within normal limits  COMPREHENSIVE METABOLIC PANEL - Abnormal; Notable for the following components:   Glucose, Bld 110 (*)    Total Protein 8.2 (*)    All other components within normal limits  URINALYSIS, ROUTINE W REFLEX MICROSCOPIC - Abnormal; Notable for the following components:   APPearance CLOUDY (*)    Hgb urine dipstick TRACE (*)    All other components within normal limits  URINALYSIS, MICROSCOPIC (REFLEX) - Abnormal; Notable for the following components:   Bacteria, UA RARE (*)    All other components within normal limits  SARS CORONAVIRUS 2 AG (30 MIN  TAT)  SARS CORONAVIRUS 2 (TAT 6-24 HRS)  LIPASE, BLOOD    EKG EKG Interpretation  Date/Time:  Thursday December 07 2019 11:00:04 EST Ventricular Rate:  104 PR Interval:    QRS Duration: 77 QT Interval:  353 QTC Calculation: 465 R Axis:   47 Text Interpretation: Sinus tachycardia Borderline T abnormalities, anterior leads No STEMI Confirmed by Octaviano Glow [phone]) on 12/07/2019 11:20:46 AM   Radiology US Abdomen Limited RUQ  Result Date: 12/07/2019 CLINICAL DATA:  Nausea and vomiting EXAM: ULTRASOUND ABDOMEN LIMITED RIGHT UPPER QUADRANT COMPARISON:  05/27/2010 FINDINGS: Gallbladder: No gallstones or wall thickening visualized. No sonographic Murphy sign noted by sonographer. Common bile duct: Diameter: 3 mm Liver: No focal lesion identified. Within normal limits in parenchymal echogenicity. Portal vein is patent on color Doppler imaging with normal direction of blood flow towards the liver. Other: None. IMPRESSION: 1. Unremarkable right upper quadrant ultrasound. Electronically Signed   By: Randa Ngo M.D.   On: 12/07/2019 12:09    Procedures Procedures (including critical care time)  Medications Ordered in ED Medications  sodium chloride 0.9 % bolus 500 mL (0 mLs Intravenous Stopped 12/07/19 1050)  ondansetron (ZOFRAN) injection 4 mg (4 mg Intravenous Given 12/07/19 0949)  sodium chloride 0.9 % bolus 500 mL (0 mLs Intravenous Stopped 12/07/19 1218)  promethazine (PHENERGAN) injection 12.5 mg (12.5 mg Intravenous Given 12/07/19 1216)    ED Course  I have reviewed the triage vital signs and the nursing notes.  Pertinent labs & imaging results that were available during my care of the patient were reviewed by me and considered in my medical decision making (see chart for details).    MDM Rules/Calculators/A&P                     54 year old female presents to the ED due to 4 episodes of nonbloody, nonbilious emesis associated with abdominal pain that started this morning around 2:30 AM.  Denies marijuana use.  Upon arrival, patient is afebrile, tachycardic at 113, but otherwise reassuring vitals.  Patient in no acute distress and nontoxic-appearing.  Abdomen soft, nondistended with generalized abdominal pain most significant in the epigastric region.  No peritoneal signs.  Doubt acute abdomen.  Will obtain routine labs, UA, and Covid  test.  Significant for leukocytosis at 12.7, but otherwise reassuring.  CMP reassuring with normal renal function and no electrolyte abnormalities.  Lipase normal at 27.  Doubt pancreatitis.  UA negative for signs of infection.  Rapid Covid test negative.  Will send for outpatient PCR test.   Patient requested more nausea medication. Will obtain EKG prior to giving more nausea medication to check for prolonged QT. Discussed QT with Dr. Langston Masker. Will give Phenergan. Dr. Langston Masker evaluated patient at bedside and agrees with assessment and plan. Will obtain RUQ [place] to rule out gallstones and cholecystitis.   RUQ personally reviewed which is negative for signs of gallstones and cholecystitis.  Suspect patient's symptoms is related to gastroenteritis. Will discharge patient with Zofran. Patient advised to continue drinking fluids along with bland foods. Work noted provided. Strict ED precautions discussed with patient. Patient states understanding and agrees to plan. Patient discharged home in no acute distress and stable vitals  Final Clinical Impression(s) / ED Diagnoses Final diagnoses:  Epigastric abdominal pain  Non-intractable vomiting with nausea, unspecified vomiting type    Rx / DC Orders ED Discharge Orders         Ordered    ondansetron (ZOFRAN ODT) 4 MG  disintegrating tablet  Every 8 hours PRN     12/07/19 1239           Karie Kirks 12/07/19 1244    Wyvonnia Dusky, MD 12/07/19 1910

## 2019-12-07 NOTE — ED Notes (Signed)
Pt transported to US

## 2019-12-07 NOTE — ED Triage Notes (Addendum)
Vomiting x 4 and chills since this morning, 0230.

## 2019-12-07 NOTE — ED Notes (Signed)
ED Provider at bedside. 

## 2019-12-07 NOTE — Discharge Instructions (Addendum)
As discussed, all of your labs and ultrasound look good today. I am sending you home with Zofran. Take as prescribed. Call health at work in regards to getting your COVID vaccine tomorrow. Your COVID test is still pending and should be available within the next 24 hours. Follow-up with your GI doctor if your symptoms do not improve within the next week. Return to the ER for new or worsening symptoms. I would assume your symptoms are related to gastroenteritis. I hope you feel better!

## 2019-12-11 ENCOUNTER — Other Ambulatory Visit: Payer: Self-pay | Admitting: Internal Medicine

## 2019-12-11 ENCOUNTER — Other Ambulatory Visit: Payer: Self-pay | Admitting: Family Medicine

## 2019-12-12 MED FILL — OSCIMIN SL 0.125 MG TABLET: 0.125 | 10 days supply | Qty: 60 | Fill #0

## 2019-12-12 MED FILL — LOSARTAN-HCTZ 50-12.5 MG TA: 50-12.5 | 90 days supply | Qty: 90 | Fill #0

## 2019-12-14 ENCOUNTER — Ambulatory Visit: Payer: 59 | Admitting: Neurology

## 2019-12-15 ENCOUNTER — Ambulatory Visit: Payer: 59 | Admitting: Neurology

## 2019-12-19 ENCOUNTER — Telehealth: Payer: Self-pay

## 2019-12-19 NOTE — Telephone Encounter (Signed)
Given that she is symptomatic w/ GI issues we are not allowed to have pt evaluated in office (even w/ negative COVID test 10 days ago).  We can do a video visit to discuss.  She can also call GI and see if there's any way for her to be seen sooner or placed on a cancellation list

## 2019-12-19 NOTE — Telephone Encounter (Signed)
Patient called in stating that she has been dealing with abdominal pain, diarrhea, constipation, N/V since beginning of January. Has been seen in ER, negative COVID test, unable to get an appt with GI until next month. Requesting in office visit with PCP. Informed patient that I would have to get OK from PCP for in office visit. Please advise

## 2019-12-20 ENCOUNTER — Ambulatory Visit: Payer: 59 | Admitting: Family Medicine

## 2019-12-20 NOTE — Telephone Encounter (Signed)
Patient states that she does not understand why we are refusing to see her with the negative covid test especially since GI cannot see her until mid March. She would like for you to go ahead and see if they can see her sooner.

## 2019-12-20 NOTE — Telephone Encounter (Signed)
Pt has GI appt in mid-March but if possible needs to be seen sooner.  Please call GI office and see if there is a way they can get her in for her abd pain, N/V/D.  Also, please inform patient that we are not refusing to see her.  It is [facility] policy that anyone w/ active GI symptoms (nausea/vomiting/diarrhea) is not to be seen in office given the current pandemic (and a negative test 11 days doesn't ensure that she is COVID free).  I am happy to do a video visit to discuss her symptoms and determine the next steps b/c I have not seen her at all for this issue.

## 2019-12-20 NOTE — Telephone Encounter (Signed)
Called pt and left a detailed message to apologize for getting back to her so late and also to inform of PCP recommendations.

## 2019-12-20 NOTE — Telephone Encounter (Signed)
Pt is scheduled for Monday 12/25/2019 with [facility]

## 2019-12-22 ENCOUNTER — Ambulatory Visit: Payer: 59 | Admitting: Family Medicine

## 2019-12-25 ENCOUNTER — Ambulatory Visit (INDEPENDENT_AMBULATORY_CARE_PROVIDER_SITE_OTHER): Payer: 59 | Admitting: Gastroenterology

## 2019-12-25 ENCOUNTER — Ambulatory Visit (INDEPENDENT_AMBULATORY_CARE_PROVIDER_SITE_OTHER)
Admission: RE | Admit: 2019-12-25 | Discharge: 2019-12-25 | Disposition: A | Discharge status: Home / Self Care | Payer: 59 | Source: Ambulatory Visit | Attending: Gastroenterology | Admitting: Gastroenterology

## 2019-12-25 ENCOUNTER — Encounter: Payer: Self-pay | Admitting: Gastroenterology

## 2019-12-25 ENCOUNTER — Other Ambulatory Visit (INDEPENDENT_AMBULATORY_CARE_PROVIDER_SITE_OTHER): Payer: 59

## 2019-12-25 ENCOUNTER — Other Ambulatory Visit: Payer: Self-pay

## 2019-12-25 VITALS — BP 118/60 | HR 70 | Temp 97.5°F | Ht 61.0 in | Wt 135.0 lb

## 2019-12-25 DIAGNOSIS — R194 Change in bowel habit: Secondary | ICD-10-CM

## 2019-12-25 DIAGNOSIS — K5900 Constipation, unspecified: Secondary | ICD-10-CM

## 2019-12-25 DIAGNOSIS — R112 Nausea with vomiting, unspecified: Secondary | ICD-10-CM

## 2019-12-25 DIAGNOSIS — R109 Unspecified abdominal pain: Secondary | ICD-10-CM

## 2019-12-25 LAB — TSH: TSH: 0.99 u[IU]/mL (ref 0.35–4.50)

## 2019-12-25 MED ORDER — FAMOTIDINE 20 MG PO TABS: 20.0000 mg | ORAL_TABLET | Freq: Two times a day (BID) | ORAL | 1 refills | Status: DC

## 2019-12-25 MED FILL — FAMOTIDINE 20 MG TABS: 20 | 30 days supply | Qty: 60 | Fill #0

## 2019-12-25 NOTE — Progress Notes (Signed)
12/25/2019 Nicole Crosby [id number] 22-Feb-1966   HISTORY OF PRESENT ILLNESS: This is a 54 year old female who is a patient of Dr. Celesta Aver.  She has a history of benign fundic gland polyps of the stomach, functional dyspepsia, GERD, history of adenomatous polyps of the colon, and IBS.  She is here today with complaints of intermittent episodes of vomiting.  She says that since the end of January she has had a few episodes of vomiting that wake her up in the middle of the night.  Overall then she just has not really felt well.  She feels nauseous and feels like her constipation has been worse.  She has very minimal symptoms of acid reflux and just uses Tums and other similar antacids as needed.  She feels that PPIs give her headaches.  She was in the emergency department for these symptoms where she had an ultrasound performed and that was normal.  Labs were unremarkable except for a mildly elevated WBC count at 12.7K.  They told her that she likely had some type of viral gastroenteritis.  She feels like she may have a partial blockage.  She tells me that she has had similar symptoms in the past with abdominal pains waking her up from sleep at night.  Is using Zofran for nausea.  Only aking Colace stool softeners for her constipation.   Past Medical History:  Diagnosis Date  . Allergy    Buspirone, Sulfa, Pantoprazole  . Benign fundic gland polyps of stomach 02/12/2016  . Chicken pox   . DYSTHYMIA 09/30/2009  . Functional dyspepsia   . GERD (gastroesophageal reflux disease)   . H/O adenomatous polyp of colon 10/04/2014   09/2014 - 3 mm adenoma - repeat colonoscopy 2022  . Hyperlipidemia    no meds  . Hypertension    pt denies  . IBS (irritable bowel syndrome)   . Insomnia   . MEDIAL EPICONDYLITIS, RIGHT 07/17/2010  . 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 Other abnormal glucose    Tiny hypodensity left hepatic lobe-CT of abdomen and pelvis 99991111 50mm periumblical hernia  . PAC  (premature atrial contraction)   . Palpitations    Past Surgical History:  Procedure Laterality Date  . CESAREAN SECTION  01/2001  . COLONOSCOPY    . DE QUERVAIN'S RELEASE  2000   Right wrist area    reports that she has never smoked. She has never used smokeless tobacco. She reports current alcohol use of about 1.0 standard drinks of alcohol per week. She reports that she does not use drugs. family history includes Arthritis in her sister; COPD in her maternal grandfather; Colon cancer in her maternal uncle; Diabetes in her father, maternal grandmother, sister, and sister; Fibrocystic breast disease in her mother and sister; Hearing loss in her mother; Heart disease in her father; Hyperlipidemia in her father, mother, sister, and sister; Hypertension in her father, mother, sister, sister, and sister; Kidney disease in her father; Osteopenia in her mother; Other in her brother; Prostate cancer in her maternal grandfather; Prostate cancer (age of onset: 27) in her father; Thyroid disease in her sister. Allergies  Allergen Reactions  . Buspirone Other (See Comments)     nightmare  . Esomeprazole Other (See Comments)    headache  . Lansoprazole     Abdominal pain  . Omeprazole     Headache  . Sulfonamide Derivatives Rash     Rash      Outpatient Encounter Medications as of 12/25/2019  Medication  Sig  . Calcium Carbonate-Vit D-Min (CALCIUM 1200 PO) Take 1,200 Units by mouth daily.  . cholecalciferol (VITAMIN D) 1000 UNITS tablet Take 2,000 Units by mouth daily.   Stasia Cavalier (EUCRISA) 2 % OINT Apply 1 application topically 2 (two) times daily.  . diazepam (VALIUM) 5 MG tablet Take 1 tablet (5 mg total) by mouth every 8 (eight) hours as needed (vertigo).  . gabapentin (NEURONTIN) 100 MG capsule Take 1 capsule every night for 1 week, then increase to 2 capsules every night  . losartan-hydrochlorothiazide (HYZAAR) 50-12.5 MG tablet Take 1 tablet by mouth daily.  . meclizine (ANTIVERT) 25  MG tablet Take 25 mg by mouth 2 (two) times daily as needed for dizziness.   . ondansetron (ZOFRAN ODT) 4 MG disintegrating tablet Take 1 tablet (4 mg total) by mouth every 8 (eight) hours as needed for nausea or vomiting.  . OSCIMIN 0.125 MG SUBL PLACE 1 TABLET (0.125 MG TOTAL) UNDER THE TONGUE EVERY 4 (FOUR) HOURS AS NEEDED.  Marland Kitchen potassium chloride SA (KLOR-CON) 20 MEQ tablet TAKE 1 TABLET BY MOUTH TWICE DAILY  . tiZANidine (ZANAFLEX) 4 MG tablet Take 1 tablet as needed at bedtime for muscle spasms  . vitamin B-12 (CYANOCOBALAMIN) 500 MCG tablet Take 500 mcg by mouth daily.  . [DISCONTINUED] Hyoscyamine Sulfate SL 0.125 MG SUBL Place 0.125 mg under the tongue.   No facility-administered encounter medications on file as of 12/25/2019.     REVIEW OF SYSTEMS  : All other systems reviewed and negative except where noted in the History of Present Illness.   PHYSICAL EXAM: BP 118/60   Pulse 70   Temp (!) 97.5 F (36.4 C)   Ht 5\' 1"  (1.549 m)   Wt 135 lb (61.2 kg)   BMI 25.51 kg/m  General: Well developed white female in no acute distress Head: Normocephalic and atraumatic Eyes:  Sclerae anicteric, conjunctiva pink. Ears: Normal auditory acuity Lungs: Clear throughout to auscultation; no increased WOB. Heart: Regular rate and rhythm; no M/R/G. Abdomen: Soft, non-distended.  BS present.  Non-tender. Musculoskeletal: Symmetrical with no gross deformities  Skin: No lesions on visible extremities Extremities: No edema  Neurological: Alert oriented x 4, grossly non-focal Psychological:  Alert and cooperative. Normal mood and affect  ASSESSMENT AND PLAN: *54 year old female with complaints of intermittent vomiting waking her up from sleep at night.  Has history of waking up at night with abdominal pain as well.  Has felt poorly now for the past 3 weeks or so.  Abdominal ultrasound earlier this month was unremarkable.  Labs also relatively unremarkable except for a mildly elevated WBC count.   She is concerned about some type of "blockage".  Will check abdominal x-ray today.  I think that we should proceed with HIDA scan to rule out biliary dysfunction.  I am going to have her start Pepcid 20 mg twice daily as she says that PPIs tend to give her headaches. *Some change in bowel habits with slight worsening of her baseline constipation: Once again will check abdominal x-ray as above.  Will check TSH.  I have advised her to take several doses of MiraLAX until her bowels get moving well and then to begin taking it once or twice a day on a regular basis.   CC:  Midge Minium, MD

## 2019-12-25 NOTE — Patient Instructions (Addendum)
Your provider has requested that you have an abdominal x ray before leaving today. Please go to the basement floor to our Radiology department for the test.  Your provider has requested that you go to the basement level for lab work before leaving today. Press "B" on the elevator. The lab is located at the first door on the left as you exit the elevator.  We have sent the following medications to your pharmacy for you to pick up at your convenience: Pepcid  Use Miralax daily and as discussed with Janett Billow in office today.   You have been scheduled for a HIDA scan at [facility] (1st floor) on 01/03/20 Please arrive 15 minutes prior to your scheduled appointment at  [id number]. Make certain not to have anything to eat or drink at least 6 hours prior to your test. Should this appointment date or time not work well for you, please call radiology scheduling at [phone].  _____________________________________________________________________ hepatobiliary (HIDA) scan is an imaging procedure used to diagnose problems in the liver, gallbladder and bile ducts. In the HIDA scan, a radioactive chemical or tracer is injected into a vein in your arm. The tracer is handled by the liver like bile. Bile is a fluid produced and excreted by your liver that helps your digestive system break down fats in the foods you eat. Bile is stored in your gallbladder and the gallbladder releases the bile when you eat a meal. A special nuclear medicine scanner (gamma camera) tracks the flow of the tracer from your liver into your gallbladder and small intestine.  During your HIDA scan  You'll be asked to change into a hospital gown before your HIDA scan begins. Your health care team will position you on a table, usually on your back. The radioactive tracer is then injected into a vein in your arm.The tracer travels through your bloodstream to your liver, where it's taken up by the bile-producing cells. The radioactive tracer  travels with the bile from your liver into your gallbladder and through your bile ducts to your small intestine.You may feel some pressure while the radioactive tracer is injected into your vein. As you lie on the table, a special gamma camera is positioned over your abdomen taking pictures of the tracer as it moves through your body. The gamma camera takes pictures continually for about an hour. You'll need to keep still during the HIDA scan. This can become uncomfortable, but you may find that you can lessen the discomfort by taking deep breaths and thinking about other things. Tell your health care team if you're uncomfortable. The radiologist will watch on a computer the progress of the radioactive tracer through your body. The HIDA scan may be stopped when the radioactive tracer is seen in the gallbladder and enters your small intestine. This typically takes about an hour. In some cases extra imaging will be performed if original images aren't satisfactory, if morphine is given to help visualize the gallbladder or if the medication CCK is given to look at the contraction of the gallbladder. This test typically takes 2 hours to complete. ________________________________________________________________________    Thank you for choosing me and [facility].  Janett Billow Zehr-PA

## 2019-12-27 DIAGNOSIS — R42 Dizziness and giddiness: Secondary | ICD-10-CM | POA: Diagnosis not present

## 2019-12-27 DIAGNOSIS — I10 Essential (primary) hypertension: Secondary | ICD-10-CM | POA: Diagnosis not present

## 2019-12-27 DIAGNOSIS — M50223 Other cervical disc displacement at C6-C7 level: Secondary | ICD-10-CM | POA: Diagnosis not present

## 2019-12-27 DIAGNOSIS — M542 Cervicalgia: Secondary | ICD-10-CM | POA: Diagnosis not present

## 2019-12-27 DIAGNOSIS — M5412 Radiculopathy, cervical region: Secondary | ICD-10-CM | POA: Diagnosis not present

## 2019-12-27 DIAGNOSIS — Z6825 Body mass index (BMI) 25.0-25.9, adult: Secondary | ICD-10-CM | POA: Diagnosis not present

## 2020-01-03 ENCOUNTER — Ambulatory Visit (HOSPITAL_COMMUNITY): Payer: 59

## 2020-01-08 MED FILL — POTASSIUM CHLORIDE CRYS ER: 20 | 30 days supply | Qty: 60 | Fill #1

## 2020-01-10 ENCOUNTER — Ambulatory Visit: Payer: 59 | Admitting: Internal Medicine

## 2020-02-06 MED FILL — POTASSIUM CHLORIDE CRYS ER: 20 | 30 days supply | Qty: 60 | Fill #2

## 2020-02-13 DIAGNOSIS — L309 Dermatitis, unspecified: Secondary | ICD-10-CM | POA: Diagnosis not present

## 2020-02-13 DIAGNOSIS — L738 Other specified follicular disorders: Secondary | ICD-10-CM | POA: Diagnosis not present

## 2020-02-13 DIAGNOSIS — L814 Other melanin hyperpigmentation: Secondary | ICD-10-CM | POA: Diagnosis not present

## 2020-02-13 DIAGNOSIS — L821 Other seborrheic keratosis: Secondary | ICD-10-CM | POA: Diagnosis not present

## 2020-02-13 MED FILL — CLINDAMYCIN PH 1% GEL: 1 | 60 days supply | Qty: 60 | Fill #0

## 2020-02-13 MED FILL — metroNIDAZOLE 0.75 % CREA: 0.75 | 45 days supply | Qty: 45 | Fill #0

## 2020-03-05 ENCOUNTER — Other Ambulatory Visit: Payer: Self-pay | Admitting: Family Medicine

## 2020-03-05 MED FILL — FAMOTIDINE 20 MG TABS: 20 | 30 days supply | Qty: 60 | Fill #1

## 2020-03-05 MED FILL — LOSARTAN-HCTZ 50-12.5 MG TA: 50-12.5 | 90 days supply | Qty: 90 | Fill #0

## 2020-03-08 MED FILL — predniSONE 20 MG TABS: 20 | 5 days supply | Qty: 5 | Fill #1

## 2020-03-11 ENCOUNTER — Telehealth: Payer: Self-pay

## 2020-03-11 NOTE — Telephone Encounter (Signed)
Patient called in wanting to be seen today for heart palpitations and feeling lightheaded. I informed the patient that there was no availability today but informed her we would transfer to the triage nurse. Patient was agreeable.

## 2020-03-12 ENCOUNTER — Other Ambulatory Visit: Payer: Self-pay

## 2020-03-12 DIAGNOSIS — R200 Anesthesia of skin: Secondary | ICD-10-CM

## 2020-03-12 DIAGNOSIS — M542 Cervicalgia: Secondary | ICD-10-CM

## 2020-03-14 ENCOUNTER — Telehealth: Payer: Self-pay

## 2020-03-14 ENCOUNTER — Other Ambulatory Visit: Payer: Self-pay

## 2020-03-14 ENCOUNTER — Encounter: Payer: 59 | Admitting: Family Medicine

## 2020-03-14 DIAGNOSIS — M47812 Spondylosis without myelopathy or radiculopathy, cervical region: Secondary | ICD-10-CM

## 2020-03-14 DIAGNOSIS — R42 Dizziness and giddiness: Secondary | ICD-10-CM

## 2020-03-14 DIAGNOSIS — R200 Anesthesia of skin: Secondary | ICD-10-CM

## 2020-03-14 DIAGNOSIS — M542 Cervicalgia: Secondary | ICD-10-CM

## 2020-03-14 NOTE — Telephone Encounter (Signed)
Referral faxed to Dr Lynann Bologna at [phone]

## 2020-03-15 ENCOUNTER — Other Ambulatory Visit: Payer: Self-pay

## 2020-03-15 ENCOUNTER — Ambulatory Visit (INDEPENDENT_AMBULATORY_CARE_PROVIDER_SITE_OTHER): Payer: 59 | Admitting: Family Medicine

## 2020-03-15 ENCOUNTER — Encounter: Payer: Self-pay | Admitting: Family Medicine

## 2020-03-15 VITALS — BP 121/72 | HR 76 | Temp 97.9°F | Resp 16 | Ht 61.0 in | Wt 134.4 lb

## 2020-03-15 DIAGNOSIS — M5432 Sciatica, left side: Secondary | ICD-10-CM | POA: Diagnosis not present

## 2020-03-15 DIAGNOSIS — M5414 Radiculopathy, thoracic region: Secondary | ICD-10-CM

## 2020-03-15 DIAGNOSIS — L600 Ingrowing nail: Secondary | ICD-10-CM

## 2020-03-15 MED ORDER — CEPHALEXIN 500 MG PO CAPS: 500.0000 mg | ORAL_CAPSULE | Freq: Two times a day (BID) | ORAL | 0 refills | Status: AC

## 2020-03-15 MED ORDER — PREDNISONE 5 MG (21) PO TBPK: ORAL_TABLET | ORAL | 0 refills | Status: DC

## 2020-03-15 NOTE — Patient Instructions (Signed)
Follow up as needed or as scheduled I'll send today's note to Dr Lynann Bologna for review for your upcoming appt START the Pred Pack as directed Soak the toe in Epsom salts, pat dry, and apply antibiotic ointment TAKE the Cephalexin twice daily for infection Call with any questions or concerns Hang in there!

## 2020-03-15 NOTE — Progress Notes (Signed)
   Subjective:    Patient ID: Nicole Crosby, female    DOB: 1966-03-13, 54 y.o.   MRN: [id number]  HPI Numbness under shoulder blades- occurs with standing.  Worsening over the past year but has been present longer than that.  Starts on L and radiates across back.  Has had cervical radiculopathy and that is being followed by Dr Vertell Limber and as of yesterday referred to Dr Lynann Bologna.  Has not had thoracic spine work up.  Sciatica- pt reports she will have numbness of L leg from 'butt cheek to ankle'.  sxs have been 'off and on for years'.  sxs occur w/ sitting and lying down.  Recently completed 5 days of prednisone for facial numbness.  Had epidural injxns in the past.  L great toe redness- lateral nail edge red, swollen, some oozing between skin and nail.  1st noticed Monday.  Has been applying neosporin w/o relief   Review of Systems For ROS see HPI   This visit occurred during the SARS-CoV-2 public health emergency.  Safety protocols were in place, including screening questions prior to the visit, additional usage of staff PPE, and extensive cleaning of exam room while observing appropriate contact time as indicated for disinfecting solutions.       Objective:   Physical Exam Musculoskeletal:        General: No tenderness (no TTP along thoracic spine or across back).  Skin:    General: Skin is warm.     Findings: Erythema (redness along lateral edge of L great toe with swelling and some serous drainage present) present.  Neurological:     General: No focal deficit present.     Mental Status: She is oriented to person, place, and time.     Cranial Nerves: No cranial nerve deficit.     Motor: No weakness.     Coordination: Coordination normal.     Gait: Gait normal.     Deep Tendon Reflexes: Reflexes normal.     Comments: (-) SLR bilaterally           Assessment & Plan:  Thoracic Radiculopathy- pt has hx of cervical radiculopathy and DDD of lumbar spine.  Current sxs are  suspicious for thoracic radiculopathy.  Will start Pred taper and pt has upcoming appt w/ Dr Lynann Bologna.  I encouraged her to discuss this at this appt  Sciatica- pt has hx of similar and has required epidural injxns.  Sxs are again consistent w/ sciatica.  Will start Pred pack and pt to discuss sxs w/ Dr Lynann Bologna at her upcoming appt  Ingrown nail w/ infxn- new.  Start Epsom soaks.  Take Keflex as directed.  If no improvement will need Podiatry referral.

## 2020-03-19 ENCOUNTER — Encounter: Payer: Self-pay | Admitting: Family Medicine

## 2020-03-26 ENCOUNTER — Ambulatory Visit: Payer: 59 | Admitting: Orthopaedic Surgery

## 2020-03-30 ENCOUNTER — Encounter: Payer: Self-pay | Admitting: Family Medicine

## 2020-04-03 ENCOUNTER — Encounter: Payer: Self-pay | Admitting: General Practice

## 2020-04-12 DIAGNOSIS — M5416 Radiculopathy, lumbar region: Secondary | ICD-10-CM | POA: Diagnosis not present

## 2020-04-12 DIAGNOSIS — M542 Cervicalgia: Secondary | ICD-10-CM | POA: Diagnosis not present

## 2020-04-15 MED FILL — POTASSIUM CHLORIDE CRYS ER: 20 | 30 days supply | Qty: 60 | Fill #4

## 2020-04-16 ENCOUNTER — Other Ambulatory Visit: Payer: Self-pay | Admitting: Internal Medicine

## 2020-04-16 ENCOUNTER — Other Ambulatory Visit: Payer: Self-pay | Admitting: Gastroenterology

## 2020-04-16 MED FILL — FAMOTIDINE 20 MG TABS: 20 | 30 days supply | Qty: 60 | Fill #0

## 2020-04-23 DIAGNOSIS — H43312 Vitreous membranes and strands, left eye: Secondary | ICD-10-CM | POA: Diagnosis not present

## 2020-04-24 ENCOUNTER — Other Ambulatory Visit: Payer: Self-pay | Admitting: Orthopedic Surgery

## 2020-04-24 DIAGNOSIS — M5416 Radiculopathy, lumbar region: Secondary | ICD-10-CM

## 2020-04-30 ENCOUNTER — Ambulatory Visit: Payer: 59 | Admitting: Neurology

## 2020-05-08 DIAGNOSIS — E663 Overweight: Secondary | ICD-10-CM | POA: Diagnosis not present

## 2020-05-08 DIAGNOSIS — I491 Atrial premature depolarization: Secondary | ICD-10-CM | POA: Diagnosis not present

## 2020-05-08 DIAGNOSIS — Z6826 Body mass index (BMI) 26.0-26.9, adult: Secondary | ICD-10-CM | POA: Diagnosis not present

## 2020-05-08 DIAGNOSIS — K588 Other irritable bowel syndrome: Secondary | ICD-10-CM | POA: Diagnosis not present

## 2020-05-08 DIAGNOSIS — K219 Gastro-esophageal reflux disease without esophagitis: Secondary | ICD-10-CM | POA: Diagnosis not present

## 2020-05-08 DIAGNOSIS — M47812 Spondylosis without myelopathy or radiculopathy, cervical region: Secondary | ICD-10-CM | POA: Diagnosis not present

## 2020-05-08 DIAGNOSIS — I10 Essential (primary) hypertension: Secondary | ICD-10-CM | POA: Diagnosis not present

## 2020-05-11 ENCOUNTER — Ambulatory Visit (INDEPENDENT_AMBULATORY_CARE_PROVIDER_SITE_OTHER): Payer: 59

## 2020-05-11 ENCOUNTER — Other Ambulatory Visit: Payer: Self-pay

## 2020-05-11 DIAGNOSIS — R200 Anesthesia of skin: Secondary | ICD-10-CM | POA: Diagnosis not present

## 2020-05-11 DIAGNOSIS — M5117 Intervertebral disc disorders with radiculopathy, lumbosacral region: Secondary | ICD-10-CM | POA: Diagnosis not present

## 2020-05-11 DIAGNOSIS — M5416 Radiculopathy, lumbar region: Secondary | ICD-10-CM

## 2020-05-11 DIAGNOSIS — M5116 Intervertebral disc disorders with radiculopathy, lumbar region: Secondary | ICD-10-CM | POA: Diagnosis not present

## 2020-05-11 DIAGNOSIS — M5115 Intervertebral disc disorders with radiculopathy, thoracolumbar region: Secondary | ICD-10-CM | POA: Diagnosis not present

## 2020-05-20 MED FILL — POTASSIUM CHLORIDE CRYS ER: 20 | 30 days supply | Qty: 60 | Fill #5

## 2020-05-21 ENCOUNTER — Encounter: Payer: Self-pay | Admitting: Neurology

## 2020-05-21 DIAGNOSIS — M533 Sacrococcygeal disorders, not elsewhere classified: Secondary | ICD-10-CM | POA: Diagnosis not present

## 2020-05-21 NOTE — Progress Notes (Signed)
[facility]    Provider:  Dr Jaynee Eagles Requesting Provider: Erline Levine, MD Primary Care Provider:  Kathyrn Lass, MD  CC:  Left facial numbness  HPI:  Nicole Crosby is a 54 y.o. female here as requested by Dr. Vertell Limber for cervicalgia.  She was evaluated in neurology by Dr. Thresa Ross in the past and she was last seen November 2020 there for vertigo and neck pain, reviewed notes: Patient initially seen for vertigo, feeling like she is on a boat, no associated headaches nausea vomiting, meclizine seemed to help, not positional in quality, 3-4 times a week in February 2020, she reported the time sensations of disequilibrium when walking around 5 years prior and had vestibular therapy, she also reported memory changes since a fall of the prior year, reported memory changes since 2015 with normal MRI of the brain in 2015.  She was found to have low B12 levels in 2020, she was on injections, B12 at that time was 402 per Dr. Posey Pronto, and paresthesias improved on that.  States that patient still has pain and hears a crack when she turns her head, dizziness with head movements is better, she also reported left-sided numbness and paresthesias, left side numbness in the jaw, and numbness spread to the cheek and eye on 10/15, she went to urgent care and was felt to have inflammation of the cranial nerve and given a course of steroids, no improvement in symptoms, back to the ER when it involved her left arm and left leg and had an MRI of the brain with and without contrast with no acute changes seen and limb symptoms resolved when she last saw Dr. Delice Lesch but she continued to feel numbness in her face bilateral.  She did state at last appointment she continued to report a lot of neck pain, she tried tizanidine in the past, MRI of the cervical spine from 2013 reported mild cervical spondylosis and 1 central disc protrusion at C6-C7 without resulting stenosis.  She also reported memory loss to Dr.  Delice Lesch, she had headaches in the past which stopped when she discontinued her reflux medication.  Patient is here alone complaining of left side of her face feels numb from the ear down the jawline and the eye. No shooting pain. Started in October, no inciting event, no trauma, she was at work and noticed it acutely, then it started going down the left arm and left leg then went to the ED. The left arm and leg resolved. She saw Dr. Delice Lesch and Dr. Amparo Bristol notes stated it was bilateral however patient does not recall this, The numbness waxes and wanes and by lunch time she can feel numbness in the left side of the face. Prednisone didn't help, she did a dose pak and it didn't help.No other focal neurologic deficits, associated symptoms, inciting events or modifiable factors.  Reviewed notes, labs and imaging from outside physicians, which showed:  MRI cervical spine 09/2019: reviewed images and agree with following C2-3: Negative.  C3-4: Negative.  C4-5: Minimal disc bulging, stable to minimally increased. No stenosis.  C5-6: Slight enlargement of a small central disc protrusion without stenosis.  C6-7: Small central disc protrusion and new mild disc bulging result in new mild right neural foraminal stenosis and borderline spinal stenosis.  C7-T1: Negative.  IMPRESSION: 1. Mildly progressive cervical spondylosis most notable at C6-7 where there is new mild right neural foraminal stenosis. 2. No significant spinal stenosis.  Review of Systems: Patient complains of symptoms per HPI  as well as the following symptoms: Numbness in the face. Pertinent negatives and positives per HPI. All others negative.   Social History   Socioeconomic History  . Marital status: Married    Spouse name: Not on file  . Number of children: 2  . Years of education: Not on file  . Highest education level: Not on file  Occupational History  . Occupation: Xray/CT Engineer, production: [employer]    Tobacco Use  . Smoking status: Never Smoker  . Smokeless tobacco: Never Used  Vaping Use  . Vaping Use: Never used  Substance and Sexual Activity  . Alcohol use: Yes    Alcohol/week: 1.0 standard drink    Types: 1 Glasses of wine per week    Comment: occasional  . Drug use: No  . Sexual activity: Yes    Birth control/protection: Inserts  Other Topics Concern  . 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    Alcohol Use - yes-wine         Full Time    Married           Social Determinants of Health   Financial Resource Strain:   . Difficulty of Paying Living Expenses:   Food Insecurity:   . Worried About Charity fundraiser in the Last Year:   . Arboriculturist in the Last Year:   Transportation Needs:   . Film/video editor (Medical):   Marland Kitchen Lack of Transportation (Non-Medical):   Physical Activity:   . Days of Exercise per Week:   . Minutes of Exercise per Session:   Stress:   . Feeling of Stress :   Social Connections:   . Frequency of Communication with Friends and Family:   . Frequency of Social Gatherings with Friends and Family:   . Attends Religious Services:   . Active Member of Clubs or Organizations:   . Attends Archivist Meetings:   Marland Kitchen Marital Status:   Intimate Partner Violence:   . Fear of Current or Ex-Partner:   . Emotionally Abused:   Marland Kitchen Physically Abused:   . Sexually Abused:     Family History  Problem Relation Age of Onset  . Hypertension Mother   . Hyperlipidemia Mother   . Fibrocystic breast disease Mother   . Hearing loss Mother   . Osteopenia Mother   . Prostate cancer Father 81       deceased  . Hypertension Father   . Diabetes Father   . Kidney disease Father   . Heart disease Father        MI  . Hyperlipidemia Father   . Other Brother        unknown  . Thyroid disease Sister   . Diabetes Sister        pre-diabetes  . Hypertension Sister   .  Arthritis Sister   . Diabetes Sister   . Hypertension Sister   . Hyperlipidemia Sister   . Fibrocystic breast disease Sister   . Colon cancer Maternal Uncle        died in late 59's  . Diabetes Maternal Grandmother   . Prostate cancer Maternal Grandfather   . COPD Maternal Grandfather   . Hyperlipidemia Sister   . Hypertension Sister   . Rectal cancer Neg Hx   . Stomach cancer Neg Hx   . Esophageal cancer Neg Hx   .  Pancreatic cancer Neg Hx     Past Medical History:  Diagnosis Date  . Allergy    Buspirone, Sulfa, Pantoprazole  . Benign fundic gland polyps of stomach 02/12/2016  . Chicken pox   . Functional dyspepsia   . GERD (gastroesophageal reflux disease)   . H/O adenomatous polyp of colon 10/04/2014   09/2014 - 3 mm adenoma - repeat colonoscopy 2022  . Hyperlipidemia    no meds  . Hypertension    pt denies  . IBS (irritable bowel syndrome)   . Insomnia   . MEDIAL EPICONDYLITIS, RIGHT 07/17/2010  . 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 Other abnormal glucose    Tiny hypodensity left hepatic lobe-CT of abdomen and pelvis 04/22/3085-VHQIO 30mm periumblical hernia  . PAC (premature atrial contraction)   . Palpitations     Patient Active Problem List   Diagnosis Date Noted  . 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 Nausea and vomiting 12/25/2019  . Abdominal pain 12/25/2019  . Constipation 12/25/2019  . Change in bowel habit 12/25/2019  . Hypokalemia 09/20/2019  . Flexural eczema 05/17/2018  . Eyelid dermatitis, allergic/contact 05/17/2018  . Medial epicondylitis, right 03/17/2018  . 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 Vitamin D deficiency 11/03/2017  . Shortened PR interval 09/27/2017  . Greater trochanteric bursitis of left hip 05/12/2017  . B12 deficiency 01/22/2017  . Memory changes 08/28/2016  . 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 Benign fundic gland polyps of stomach 02/12/2016  . Paresthesia 12/17/2015  . Bladder spasm 12/13/2014  . H/O  adenomatous polyp of colon 10/04/2014  . Screening for malignant neoplasm of cervix 05/31/2013  . Physical exam 05/31/2013  . Left L3 lumbar radiculitis 09/23/2012  . Neck pain 08/22/2012  . Adrenal nodule ([place]) 03/02/2012  . Essential hypertension, benign 03/02/2012  . IBS (irritable bowel syndrome)   . PAC (premature atrial contraction)   . Insomnia   . GERD (gastroesophageal reflux disease)   . DYSTHYMIA 09/30/2009  . HEADACHE 09/21/2008  . HEPATIC CYST 08/14/2008  . Hyperlipidemia 08/06/2008    Past Surgical History:  Procedure Laterality Date  . CESAREAN SECTION  01/2001  . COLONOSCOPY    . DE QUERVAIN'S RELEASE  2000   Right wrist area    Current Outpatient Medications  Medication Sig Dispense Refill  . Calcium Carbonate-Vit D-Min (CALCIUM 1200 PO) Take 1,200 Units by mouth daily.    . cholecalciferol (VITAMIN D) 1000 UNITS tablet Take 2,000 Units by mouth daily.     . clindamycin (CLINDAGEL) 1 % gel APPLY TWICE DAILY TO AFFECTED AREAS IN GROIN AS NEEDED FOR FLARES.    Stasia Cavalier (EUCRISA) 2 % OINT Apply 1 application topically 2 (two) times daily. 60 g 1  . diazepam (VALIUM) 5 MG tablet Take 1 tablet (5 mg total) by mouth every 8 (eight) hours as needed (vertigo). 8 tablet 0  . famotidine (PEPCID) 20 MG tablet TAKE 1 TABLET BY MOUTH TWICE DAILY 60 tablet 1  . gabapentin (NEURONTIN) 100 MG capsule Take 1 capsule every night for 1 week, then increase to 2 capsules every night 60 capsule 11  . losartan-hydrochlorothiazide (HYZAAR) 50-12.5 MG tablet TAKE 1 TABLET BY MOUTH DAILY. 90 tablet 0  . meclizine (ANTIVERT) 25 MG tablet Take 25 mg by mouth 2 (two) times daily as needed for dizziness.     . melatonin 3 MG TABS tablet Take 3 mg by mouth at bedtime as needed.    . methocarbamol (ROBAXIN) 500 MG tablet Take 500  mg by mouth every 6 (six) hours as needed.    . metroNIDAZOLE (METROCREAM) 0.75 % cream APPLY TO THE NOSE TWICE DAILY    . ondansetron (ZOFRAN ODT) 4 MG  disintegrating tablet Take 1 tablet (4 mg total) by mouth every 8 (eight) hours as needed for nausea or vomiting. 20 tablet 0  . OSCIMIN 0.125 MG SUBL PLACE 1 TABLET (0.125 MG TOTAL) UNDER THE TONGUE EVERY 4 (FOUR) HOURS AS NEEDED. 60 tablet 0  . potassium chloride SA (KLOR-CON) 20 MEQ tablet TAKE 1 TABLET BY MOUTH TWICE DAILY 60 tablet 6  . predniSONE (STERAPRED UNI-PAK 21 TAB) 5 MG (21) TBPK tablet Take as directed on pack 21 each 0  . tiZANidine (ZANAFLEX) 4 MG tablet Take 1 tablet as needed at bedtime for muscle spasms 30 tablet 5  . vitamin B-12 (CYANOCOBALAMIN) 500 MCG tablet Take 500 mcg by mouth daily.     No current facility-administered medications for this visit.    Allergies as of 05/22/2020 - Review Complete 05/22/2020  Allergen Reaction Noted  . Buspirone Other (See Comments) 08/24/2012  . Esomeprazole Other (See Comments) 07/09/2016  . Lansoprazole  12/16/2015  . Omeprazole  12/16/2015  . Proton pump inhibitors  05/22/2020  . Sulfonamide derivatives Rash 11/21/2009    Vitals: BP 112/69   Pulse 90   Ht 5\' 1"  (1.549 m)   Wt 139 lb (63 kg)   LMP 11/15/2018   BMI 26.26 kg/m  Last Weight:  Wt Readings from Last 1 Encounters:  05/22/20 139 lb (63 kg)   Last Height:   Ht Readings from Last 1 Encounters:  05/22/20 5\' 1"  (1.549 m)     Physical exam: Exam: Gen: NAD, conversant, well nourised, well groomed                     CV: RRR, no MRG. No Carotid Bruits. No peripheral edema, warm, nontender Eyes: Conjunctivae clear without exudates or hemorrhage  Neuro: Detailed Neurologic Exam  Speech:    Speech is normal; fluent and spontaneous with normal comprehension.  Cognition:    The patient is oriented to person, place, and time;     recent and remote memory intact;     language fluent;     normal attention, concentration,     fund of knowledge Cranial Nerves:    The pupils are equal, round, and reactive to light. The fundi are flat. Visual fields are full  to finger confrontation. Extraocular movements are intact. Trigeminal sensation impaired on the left. Slight lid droop on the left, otherwise The face is symmetric. The palate elevates in the midline. Hearing intact. Voice is normal. Shoulder shrug is normal. The tongue has normal motion without fasciculations.   Coordination:    No dysmetria or ataxia  Gait:    Normal native gait  Motor Observation:    No asymmetry, no atrophy, and no involuntary movements noted. Tone:    Normal muscle tone.    Posture:    Posture is normal. normal erect    Strength:    Strength is V/V in the upper and lower limbs.      Sensation: intact to LT     Reflex Exam:  DTR's:    Deep tendon reflexes in the upper and lower extremities are symmetrical bilaterally.  1+ uppers 2+ lowers.  Toes:    The toes are downgoing bilaterally.   Clonus:    Clonus is absent.    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  Ongoing since October 2020, she saw Dr. Delice Lesch in the past for multiple symptoms including the sensory changes in the face which per notes were bilateral but patient does not remember stating that, as far she knows it is always been left side radiating from the front of the ear down to the jawline.  We discussed causes of trigeminal neuralgia/neuropathy as below and today we will order blood work and also order MRI of the head with trigeminal protocol.   Causes of trigeminal Neuropathy  Trauma:  Accidental, surgical, dental (especially at 3rd molar), chemical (glycerol rhizotomy), radiation Inflammatory/autoimmune:  Undifferentiated and mixed connective tissue disease  Progressive systemic scleroderma, Sjo gren's syndrome, sarcoidosis, multiple sclerosis Vascular:  Pontomedullary ischemia or hemorrhage (CNS mimic)  Vascular malformation Neoplastic:  Intra- or  extra-cranial compression (meningioma, trigeminal or vestibular schwannoma, nasopharyngeal carcinoma)  Perineural spread (adenoid cystic carcinoma, squamous cell carcinoma, lymphoma)  Metastasis (breast and lung carcinoma, melanoma)  Carcinomatous meningitis (breast and lung carcinoma, melanoma, lymphoma) Infectious:  Leprosy, viruses (varicella zoster virus, herpes simplex virus), Lyme disease, syphilis, fungi (actinomycosis) Degenerative:  Kennedy's disease Toxic-metabolic  Stilbamidine, trichloroethylene, oxaliplatin, diabetes mellitus Congenital:  Congenital trigeminal anesthesia with or without Goldenhar-Gorlin syndrome or Mo bius syndrome  Skull base anomalies Idiopathic trigeminal neuropathy Other:  Amyloidosis, pseudotumor cerebri   Orders Placed This Encounter  Procedures  . MR FACE/TRIGEMINAL WO/W CM  . ANA, IFA (with reflex)  . Sjogren's syndrome antibods(ssa + ssb)  . Rheumatoid factor  . B. burgdorfi Antibody  . Hemoglobin A1c   No orders of the defined types were placed in this encounter.   Cc:   Kathyrn Lass, MD, Erline Levine, MD  Sarina Ill, MD  [facility] [address] [place], [place] [address]  Phone [phone] Fax [phone]

## 2020-05-22 ENCOUNTER — Encounter: Payer: Self-pay | Admitting: Neurology

## 2020-05-22 ENCOUNTER — Ambulatory Visit (INDEPENDENT_AMBULATORY_CARE_PROVIDER_SITE_OTHER): Payer: 59 | Admitting: Neurology

## 2020-05-22 VITALS — BP 112/69 | HR 90 | Ht 61.0 in | Wt 139.0 lb

## 2020-05-22 DIAGNOSIS — G500 Trigeminal neuralgia: Secondary | ICD-10-CM | POA: Diagnosis not present

## 2020-05-22 DIAGNOSIS — G509 Disorder of trigeminal nerve, unspecified: Secondary | ICD-10-CM

## 2020-05-22 DIAGNOSIS — R200 Anesthesia of skin: Secondary | ICD-10-CM | POA: Diagnosis not present

## 2020-05-22 DIAGNOSIS — R202 Paresthesia of skin: Secondary | ICD-10-CM

## 2020-05-22 NOTE — Patient Instructions (Addendum)
  Trigeminal neuropathy  MRI head/face with trigeminal protocol Blood work

## 2020-05-23 ENCOUNTER — Encounter: Payer: Self-pay | Admitting: Neurology

## 2020-05-23 DIAGNOSIS — G509 Disorder of trigeminal nerve, unspecified: Secondary | ICD-10-CM | POA: Insufficient documentation

## 2020-05-24 LAB — B. BURGDORFI ANTIBODIES: Lyme IgG/IgM Ab: <0.91 {ISR} (ref 0.00–0.90)

## 2020-05-24 LAB — SJOGREN'S SYNDROME ANTIBODS(SSA + SSB)
ENA SSA (RO) Ab: <0.2 AI (ref 0.0–0.9)
ENA SSB (LA) Ab: <0.2 AI (ref 0.0–0.9)

## 2020-05-24 LAB — HEMOGLOBIN A1C
Est. average glucose Bld gHb Est-mCnc: 120 mg/dL
Hgb A1c MFr Bld: 5.8 % — ABNORMAL HIGH (ref 4.8–5.6)

## 2020-05-24 LAB — ANTINUCLEAR ANTIBODIES, IFA: ANA Titer 1: NEGATIVE

## 2020-05-24 LAB — RHEUMATOID FACTOR: Rheumatoid fact SerPl-aCnc: 17.2 IU/mL — ABNORMAL HIGH (ref 0.0–13.9)

## 2020-05-25 ENCOUNTER — Telehealth: Payer: Self-pay | Admitting: Neurology

## 2020-05-25 NOTE — Telephone Encounter (Signed)
[facility] no auth. Patient is scheduled at [facility] for 05/28/20.

## 2020-05-28 ENCOUNTER — Other Ambulatory Visit: Payer: Self-pay

## 2020-05-28 ENCOUNTER — Ambulatory Visit (INDEPENDENT_AMBULATORY_CARE_PROVIDER_SITE_OTHER): Payer: 59

## 2020-05-28 DIAGNOSIS — G500 Trigeminal neuralgia: Secondary | ICD-10-CM

## 2020-05-28 DIAGNOSIS — R202 Paresthesia of skin: Secondary | ICD-10-CM | POA: Diagnosis not present

## 2020-05-28 DIAGNOSIS — R200 Anesthesia of skin: Secondary | ICD-10-CM

## 2020-05-28 DIAGNOSIS — G509 Disorder of trigeminal nerve, unspecified: Secondary | ICD-10-CM | POA: Diagnosis not present

## 2020-05-28 MED ORDER — GADOBUTROL 1 MMOL/ML IV SOLN
6.0000 mL | Freq: Once | INTRAVENOUS | Status: AC | PRN
  Administered 2020-05-28: 7.5 mL via INTRAVENOUS

## 2020-06-13 MED FILL — LOSARTAN-HCTZ 50-12.5 MG TA: 50-12.5 | 90 days supply | Qty: 90 | Fill #0

## 2020-06-20 ENCOUNTER — Encounter: Payer: 59 | Admitting: Family Medicine

## 2020-06-24 MED FILL — POTASSIUM CHLORIDE CRYS ER: 20 | 30 days supply | Qty: 60 | Fill #6

## 2020-06-27 ENCOUNTER — Telehealth: Payer: 59 | Admitting: Neurology

## 2020-07-17 DIAGNOSIS — Z6825 Body mass index (BMI) 25.0-25.9, adult: Secondary | ICD-10-CM | POA: Diagnosis not present

## 2020-07-17 DIAGNOSIS — I10 Essential (primary) hypertension: Secondary | ICD-10-CM | POA: Diagnosis not present

## 2020-07-17 DIAGNOSIS — K588 Other irritable bowel syndrome: Secondary | ICD-10-CM | POA: Diagnosis not present

## 2020-07-17 DIAGNOSIS — K219 Gastro-esophageal reflux disease without esophagitis: Secondary | ICD-10-CM | POA: Diagnosis not present

## 2020-07-17 DIAGNOSIS — Z0000 Encounter for general adult medical examination without abnormal findings: Secondary | ICD-10-CM | POA: Diagnosis not present

## 2020-07-17 DIAGNOSIS — R110 Nausea: Secondary | ICD-10-CM | POA: Diagnosis not present

## 2020-07-24 ENCOUNTER — Other Ambulatory Visit: Payer: Self-pay | Admitting: Family Medicine

## 2020-07-24 DIAGNOSIS — R634 Abnormal weight loss: Secondary | ICD-10-CM

## 2020-07-24 DIAGNOSIS — R1013 Epigastric pain: Secondary | ICD-10-CM

## 2020-07-30 ENCOUNTER — Other Ambulatory Visit (HOSPITAL_BASED_OUTPATIENT_CLINIC_OR_DEPARTMENT_OTHER): Payer: Self-pay | Admitting: Family Medicine

## 2020-07-30 ENCOUNTER — Other Ambulatory Visit: Payer: Self-pay

## 2020-07-30 ENCOUNTER — Ambulatory Visit (INDEPENDENT_AMBULATORY_CARE_PROVIDER_SITE_OTHER): Payer: 59

## 2020-07-30 DIAGNOSIS — I700 Atherosclerosis of aorta: Secondary | ICD-10-CM | POA: Diagnosis not present

## 2020-07-30 DIAGNOSIS — R1013 Epigastric pain: Secondary | ICD-10-CM | POA: Diagnosis not present

## 2020-07-30 DIAGNOSIS — R634 Abnormal weight loss: Secondary | ICD-10-CM | POA: Diagnosis not present

## 2020-07-30 DIAGNOSIS — D3500 Benign neoplasm of unspecified adrenal gland: Secondary | ICD-10-CM | POA: Diagnosis not present

## 2020-07-30 DIAGNOSIS — K7689 Other specified diseases of liver: Secondary | ICD-10-CM | POA: Diagnosis not present

## 2020-07-30 MED ORDER — IOHEXOL 300 MG/ML  SOLN
100.0000 mL | Freq: Once | INTRAMUSCULAR | Status: AC | PRN
  Administered 2020-07-30: 100 mL via INTRAVENOUS

## 2020-08-01 ENCOUNTER — Other Ambulatory Visit (HOSPITAL_BASED_OUTPATIENT_CLINIC_OR_DEPARTMENT_OTHER): Payer: Self-pay | Admitting: Family Medicine

## 2020-08-01 MED FILL — POTASSIUM CL ER 20 MEQ TAB: 20 | 90 days supply | Qty: 180 | Fill #0

## 2020-08-19 DIAGNOSIS — N939 Abnormal uterine and vaginal bleeding, unspecified: Secondary | ICD-10-CM | POA: Diagnosis not present

## 2020-08-19 DIAGNOSIS — I700 Atherosclerosis of aorta: Secondary | ICD-10-CM | POA: Diagnosis not present

## 2020-08-19 DIAGNOSIS — R110 Nausea: Secondary | ICD-10-CM | POA: Diagnosis not present

## 2020-08-20 ENCOUNTER — Other Ambulatory Visit (HOSPITAL_BASED_OUTPATIENT_CLINIC_OR_DEPARTMENT_OTHER): Payer: Self-pay | Admitting: Family Medicine

## 2020-08-21 ENCOUNTER — Other Ambulatory Visit: Payer: Self-pay | Admitting: Family Medicine

## 2020-08-21 ENCOUNTER — Other Ambulatory Visit (HOSPITAL_COMMUNITY): Payer: Self-pay | Admitting: Family Medicine

## 2020-08-21 DIAGNOSIS — R110 Nausea: Secondary | ICD-10-CM

## 2020-08-21 MED FILL — FAMOTIDINE 20 MG TABS: 20 | 30 days supply | Qty: 60 | Fill #1

## 2020-08-26 ENCOUNTER — Other Ambulatory Visit (HOSPITAL_BASED_OUTPATIENT_CLINIC_OR_DEPARTMENT_OTHER): Payer: Self-pay | Admitting: Family Medicine

## 2020-08-26 DIAGNOSIS — N939 Abnormal uterine and vaginal bleeding, unspecified: Secondary | ICD-10-CM

## 2020-09-06 ENCOUNTER — Other Ambulatory Visit: Payer: Self-pay

## 2020-09-06 ENCOUNTER — Ambulatory Visit (HOSPITAL_BASED_OUTPATIENT_CLINIC_OR_DEPARTMENT_OTHER)
Admission: RE | Admit: 2020-09-06 | Discharge: 2020-09-06 | Disposition: A | Discharge status: Home / Self Care | Payer: 59 | Source: Ambulatory Visit | Attending: Family Medicine | Admitting: Family Medicine

## 2020-09-06 ENCOUNTER — Other Ambulatory Visit: Payer: Self-pay | Admitting: Family Medicine

## 2020-09-06 DIAGNOSIS — Z1231 Encounter for screening mammogram for malignant neoplasm of breast: Secondary | ICD-10-CM

## 2020-09-06 DIAGNOSIS — N950 Postmenopausal bleeding: Secondary | ICD-10-CM | POA: Diagnosis not present

## 2020-09-06 DIAGNOSIS — N939 Abnormal uterine and vaginal bleeding, unspecified: Secondary | ICD-10-CM | POA: Insufficient documentation

## 2020-09-12 MED FILL — LOSARTAN-HCTZ 50-12.5 MG TA: 50-12.5 | 90 days supply | Qty: 90 | Fill #0

## 2020-09-20 ENCOUNTER — Encounter (HOSPITAL_COMMUNITY)
Admission: RE | Admit: 2020-09-20 | Discharge: 2020-09-20 | Disposition: A | Discharge status: Home / Self Care | Payer: 59 | Source: Ambulatory Visit | Attending: Family Medicine | Admitting: Family Medicine

## 2020-09-20 ENCOUNTER — Other Ambulatory Visit: Payer: Self-pay

## 2020-09-20 DIAGNOSIS — R110 Nausea: Secondary | ICD-10-CM | POA: Diagnosis not present

## 2020-09-20 DIAGNOSIS — R109 Unspecified abdominal pain: Secondary | ICD-10-CM | POA: Diagnosis not present

## 2020-09-20 MED ORDER — TECHNETIUM TC 99M MEBROFENIN IV KIT
5.3000 | PACK | Freq: Once | INTRAVENOUS | Status: AC | PRN
  Administered 2020-09-20: 5.3 via INTRAVENOUS

## 2020-10-18 DIAGNOSIS — R110 Nausea: Secondary | ICD-10-CM | POA: Diagnosis not present

## 2020-10-18 DIAGNOSIS — Z6825 Body mass index (BMI) 25.0-25.9, adult: Secondary | ICD-10-CM | POA: Diagnosis not present

## 2020-10-23 ENCOUNTER — Ambulatory Visit (INDEPENDENT_AMBULATORY_CARE_PROVIDER_SITE_OTHER): Payer: 59

## 2020-10-23 ENCOUNTER — Other Ambulatory Visit: Payer: Self-pay

## 2020-10-23 DIAGNOSIS — Z1231 Encounter for screening mammogram for malignant neoplasm of breast: Secondary | ICD-10-CM | POA: Diagnosis not present

## 2020-10-29 DIAGNOSIS — H52223 Regular astigmatism, bilateral: Secondary | ICD-10-CM | POA: Diagnosis not present

## 2020-10-29 DIAGNOSIS — H524 Presbyopia: Secondary | ICD-10-CM | POA: Diagnosis not present

## 2020-10-29 DIAGNOSIS — H5203 Hypermetropia, bilateral: Secondary | ICD-10-CM | POA: Diagnosis not present

## 2020-11-06 MED FILL — POTASSIUM CHLORIDE CRYS ER: 20 | 90 days supply | Qty: 180 | Fill #0

## 2020-11-09 ENCOUNTER — Emergency Department (HOSPITAL_BASED_OUTPATIENT_CLINIC_OR_DEPARTMENT_OTHER)
Admission: EM | Admit: 2020-11-09 | Discharge: 2020-11-09 | Disposition: A | Discharge status: Home / Self Care | Payer: 59 | Attending: Emergency Medicine | Admitting: Emergency Medicine

## 2020-11-09 ENCOUNTER — Emergency Department (HOSPITAL_BASED_OUTPATIENT_CLINIC_OR_DEPARTMENT_OTHER): Payer: 59

## 2020-11-09 ENCOUNTER — Encounter (HOSPITAL_BASED_OUTPATIENT_CLINIC_OR_DEPARTMENT_OTHER): Payer: Self-pay | Admitting: Emergency Medicine

## 2020-11-09 ENCOUNTER — Other Ambulatory Visit: Payer: Self-pay

## 2020-11-09 DIAGNOSIS — Z20822 Contact with and (suspected) exposure to covid-19: Secondary | ICD-10-CM | POA: Diagnosis not present

## 2020-11-09 DIAGNOSIS — R52 Pain, unspecified: Secondary | ICD-10-CM

## 2020-11-09 DIAGNOSIS — M791 Myalgia, unspecified site: Secondary | ICD-10-CM | POA: Insufficient documentation

## 2020-11-09 DIAGNOSIS — R110 Nausea: Secondary | ICD-10-CM | POA: Diagnosis not present

## 2020-11-09 DIAGNOSIS — R202 Paresthesia of skin: Secondary | ICD-10-CM | POA: Insufficient documentation

## 2020-11-09 DIAGNOSIS — R079 Chest pain, unspecified: Secondary | ICD-10-CM | POA: Diagnosis not present

## 2020-11-09 DIAGNOSIS — I10 Essential (primary) hypertension: Secondary | ICD-10-CM | POA: Diagnosis not present

## 2020-11-09 LAB — COMPREHENSIVE METABOLIC PANEL
ALT: 15 U/L (ref 0–44)
AST: 26 U/L (ref 15–41)
Albumin: 4.2 g/dL (ref 3.5–5.0)
Alkaline Phosphatase: 66 U/L (ref 38–126)
Anion gap: 10 (ref 5–15)
BUN: 6 mg/dL (ref 6–20)
CO2: 26 mmol/L (ref 22–32)
Calcium: 9.3 mg/dL (ref 8.9–10.3)
Chloride: 100 mmol/L (ref 98–111)
Creatinine, Ser: 0.6 mg/dL (ref 0.44–1.00)
GFR, Estimated: >60 mL/min (ref >60)
Glucose, Bld: 123 mg/dL — ABNORMAL HIGH (ref 70–99)
Potassium: 3.7 mmol/L (ref 3.5–5.1)
Sodium: 136 mmol/L (ref 135–145)
Total Bilirubin: 0.8 mg/dL (ref 0.3–1.2)
Total Protein: 7.5 g/dL (ref 6.5–8.1)

## 2020-11-09 LAB — CBC
HCT: 42.5 % (ref 36.0–46.0)
Hemoglobin: 14.1 g/dL (ref 12.0–15.0)
MCH: 30.5 pg (ref 26.0–34.0)
MCHC: 33.2 g/dL (ref 30.0–36.0)
MCV: 91.8 fL (ref 80.0–100.0)
Platelets: 288 10*3/uL (ref 150–400)
RBC: 4.63 MIL/uL (ref 3.87–5.11)
RDW: 12.2 % (ref 11.5–15.5)
WBC: 5.3 10*3/uL (ref 4.0–10.5)
nRBC: 0 % (ref 0.0–0.2)

## 2020-11-09 LAB — TROPONIN I (HIGH SENSITIVITY): Troponin I (High Sensitivity): <2 ng/L (ref <18)

## 2020-11-09 MED ORDER — SODIUM CHLORIDE 0.9 % IV BOLUS
1000.0000 mL | Freq: Once | INTRAVENOUS | Status: AC
  Administered 2020-11-09: 1000 mL via INTRAVENOUS

## 2020-11-09 MED ORDER — PROCHLORPERAZINE EDISYLATE 10 MG/2ML IJ SOLN
5.0000 mg | Freq: Once | INTRAMUSCULAR | Status: AC
  Administered 2020-11-09: 5 mg via INTRAVENOUS
  Filled 2020-11-09: qty 2

## 2020-11-09 MED ORDER — ONDANSETRON 4 MG PO TBDP: 4.0000 mg | ORAL_TABLET | Freq: Three times a day (TID) | ORAL | 0 refills | Status: DC | PRN

## 2020-11-09 MED ORDER — DIPHENHYDRAMINE HCL 50 MG/ML IJ SOLN
25.0000 mg | Freq: Once | INTRAMUSCULAR | Status: AC
  Administered 2020-11-09: 25 mg via INTRAVENOUS
  Filled 2020-11-09: qty 1

## 2020-11-09 NOTE — Discharge Instructions (Signed)
You were evaluated in the Emergency Department and after careful evaluation, we did not find any emergent condition requiring admission or further testing in the hospital.  Your exam/testing today was overall reassuring.  Your symptoms may be due to a viral illness, possibly coronavirus. We have tested you for the coronavirus here in the Emergency Department.  Please isolate or quarantine at home until you receive a negative test result.  If positive, we recommend continued home quarantine per [facility] recommendations.   Please return to the Emergency Department if you experience any worsening of your condition.  Thank you for allowing [place] to be a part of your care.

## 2020-11-09 NOTE — ED Triage Notes (Signed)
Pt states yesterday she started having body aches  Later in the day started having nausea, tingling in her chest and hands  Has had some pain in her back but states she has had that off and on for a while  Pt states she took a zofran about 11 last night  Denies vomiting  Pt states she has had the tingling before when her potassium levels have been low

## 2020-11-09 NOTE — ED Provider Notes (Signed)
[facility] Emergency Department Provider Note MRN:  [id number]  Arrival date & time: 11/09/20     Chief Complaint   Tingling and Nausea   History of Present Illness   Nicole Crosby is a 55 y.o. year-old female with no pertinent past medical history presenting to the ED with chief complaint of tingling and nausea.  Symptoms began yesterday with body aches.  Today she is experiencing nausea, poor appetite, tingling in the hands and across the chest.  Denies shortness of breath, no cough, no fevers but endorsing chills off and on.  No diarrhea, no abdominal pain, no numbness or weakness to the arms or legs.  Feels similar to when she has low potassium.  Review of Systems  A complete 10 system review of systems was obtained and all systems are negative except as noted in the HPI and PMH.   Patient's Health History    Past Medical History:  Diagnosis Date  . Allergy    Buspirone, Sulfa, Pantoprazole  . Benign fundic gland polyps of stomach 02/12/2016  . Chicken pox   . Functional dyspepsia   . GERD (gastroesophageal reflux disease)   . H/O adenomatous polyp of colon 10/04/2014   09/2014 - 3 mm adenoma - repeat colonoscopy 2022  . Hyperlipidemia    no meds  . Hypertension    pt denies  . IBS (irritable bowel syndrome)   . Insomnia   . MEDIAL EPICONDYLITIS, RIGHT 07/17/2010  . 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 Other abnormal glucose    Tiny hypodensity left hepatic lobe-CT of abdomen and pelvis 2/70/6237-SEGBT 24mm periumblical hernia  . PAC (premature atrial contraction)   . Palpitations     Past Surgical History:  Procedure Laterality Date  . CESAREAN SECTION  01/2001  . COLONOSCOPY    . DE QUERVAIN'S RELEASE  2000   Right wrist area    Family History  Problem Relation Age of Onset  . Hypertension Mother   . Hyperlipidemia Mother   . Fibrocystic breast disease Mother   . Hearing loss Mother   . Osteopenia Mother   . Prostate cancer Father  71       deceased  . Hypertension Father   . Diabetes Father   . Kidney disease Father   . Heart disease Father        MI  . Hyperlipidemia Father   . Other Brother        unknown  . Thyroid disease Sister   . Diabetes Sister        pre-diabetes  . Hypertension Sister   . Arthritis Sister   . Diabetes Sister   . Hypertension Sister   . Hyperlipidemia Sister   . Fibrocystic breast disease Sister   . Colon cancer Maternal Uncle        died in late 75's  . Diabetes Maternal Grandmother   . Prostate cancer Maternal Grandfather   . COPD Maternal Grandfather   . Hyperlipidemia Sister   . Hypertension Sister   . Rectal cancer Neg Hx   . Stomach cancer Neg Hx   . Esophageal cancer Neg Hx   . Pancreatic cancer Neg Hx     Social History   Socioeconomic History  . Marital status: Married    Spouse name: Not on file  . Number of children: 2  . Years of education: Not on file  . Highest education level: Not on file  Occupational History  . 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 Smoking status: Never Smoker  . Smokeless tobacco: Never Used  Vaping Use  . Vaping Use: Never used  Substance and Sexual Activity  . Alcohol use: Yes    Alcohol/week: 1.0 standard drink    Types: 1 Glasses of wine per week    Comment: occasional  . Drug use: No  . Sexual activity: Yes    Birth control/protection: Inserts  Other Topics Concern  . 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    Alcohol Use - yes-wine         Full Time    Married           Social Determinants of Health   Financial Resource Strain: Not on file  Food Insecurity: Not on file  Transportation Needs: Not on file  Physical Activity: Not on file  Stress: Not on file  Social Connections: Not on file  Intimate Partner Violence: Not on file     Physical Exam   Vitals:   11/09/20 0405 11/09/20 0450  BP: (!) 141/82 138/73   Pulse: 89 87  Resp: 18 16  Temp: 98.2 F (36.8 C)   SpO2: 99% 99%    CONSTITUTIONAL: Well-appearing, NAD NEURO:  Alert and oriented x 3, no focal deficits EYES:  eyes equal and reactive ENT/NECK:  no LAD, no JVD CARDIO: Regular rate, well-perfused, normal S1 and S2 PULM:  CTAB no wheezing or rhonchi GI/GU:  normal bowel sounds, non-distended, non-tender MSK/SPINE:  No gross deformities, no edema SKIN:  no rash, atraumatic PSYCH:  Appropriate speech and behavior  *Additional and/or pertinent findings included in MDM below  Diagnostic and Interventional Summary    EKG Interpretation  Date/Time:  Saturday November 09 2020 04:47:18 EST Ventricular Rate:  86 PR Interval:    QRS Duration: 78 QT Interval:  396 QTC Calculation: 474 R Axis:   53 Text Interpretation: Sinus rhythm Low voltage, precordial leads Borderline T abnormalities, anterior leads Baseline wander in lead(s) V4 Confirmed by Gerlene Fee [phone]) on 11/09/2020 5:00:58 AM      Labs Reviewed  COMPREHENSIVE METABOLIC PANEL - Abnormal; Notable for the following components:      Result Value   Glucose, Bld 123 (*)    All other components within normal limits  SARS CORONAVIRUS 2 (TAT 6-24 HRS)  CBC  TROPONIN I (HIGH SENSITIVITY)    DG Chest Port 1 View  Final Result      Medications  sodium chloride 0.9 % bolus 1,000 mL (0 mLs Intravenous Stopped 11/09/20 0539)  prochlorperazine (COMPAZINE) injection 5 mg (5 mg Intravenous Given 11/09/20 0434)  diphenhydrAMINE (BENADRYL) injection 25 mg (25 mg Intravenous Given 11/09/20 0434)     Procedures  /  Critical Care Procedures  ED Course and Medical Decision Making  I have reviewed the triage vital signs, the nursing notes, and pertinent available records from the EMR.  Listed above are laboratory and imaging tests that I personally ordered, reviewed, and interpreted and then considered in my medical decision making (see below for details).  Multiple vague symptoms  including body aches, paresthesias, strange feeling in the chest.  Unlikely but will screen for ACS with EKG and troponin, will check electrolytes given she has a history of hypokalemia.  Overall favoring a viral process, possibly COVID-19.  Anticipating discharge if work-up is reassuring.       Barth Kirks. Sedonia Small, MD  [place] [email or web address]  Final Clinical Impressions(s) / ED Diagnoses     ICD-10-CM   1. Nausea  R11.0   2. Paresthesia  R20.2   3. Body aches  R52     ED Discharge Orders         Ordered    ondansetron (ZOFRAN ODT) 4 MG disintegrating tablet  Every 8 hours PRN        11/09/20 0603           Discharge Instructions Discussed with and Provided to Patient:     Discharge Instructions     You were evaluated in the Emergency Department and after careful evaluation, we did not find any emergent condition requiring admission or further testing in the hospital.  Your exam/testing today was overall reassuring.  Your symptoms may be due to a viral illness, possibly coronavirus. We have tested you for the coronavirus here in the Emergency Department.  Please isolate or quarantine at home until you receive a negative test result.  If positive, we recommend continued home quarantine per [facility] recommendations.   Please return to the Emergency Department if you experience any worsening of your condition.  Thank you for allowing [place] to be a part of your care.        Maudie Flakes, MD 11/09/20 [phone]

## 2020-11-10 LAB — SARS CORONAVIRUS 2 (TAT 6-24 HRS): SARS Coronavirus 2: NEGATIVE

## 2020-11-21 ENCOUNTER — Other Ambulatory Visit (INDEPENDENT_AMBULATORY_CARE_PROVIDER_SITE_OTHER): Payer: 59

## 2020-11-21 ENCOUNTER — Encounter: Payer: Self-pay | Admitting: Internal Medicine

## 2020-11-21 ENCOUNTER — Ambulatory Visit (INDEPENDENT_AMBULATORY_CARE_PROVIDER_SITE_OTHER): Payer: 59 | Admitting: Internal Medicine

## 2020-11-21 VITALS — BP 126/66 | HR 74 | Ht 61.0 in | Wt 129.0 lb

## 2020-11-21 DIAGNOSIS — R195 Other fecal abnormalities: Secondary | ICD-10-CM

## 2020-11-21 DIAGNOSIS — R112 Nausea with vomiting, unspecified: Secondary | ICD-10-CM

## 2020-11-21 DIAGNOSIS — R6881 Early satiety: Secondary | ICD-10-CM

## 2020-11-21 DIAGNOSIS — R634 Abnormal weight loss: Secondary | ICD-10-CM

## 2020-11-21 DIAGNOSIS — F439 Reaction to severe stress, unspecified: Secondary | ICD-10-CM

## 2020-11-21 DIAGNOSIS — R739 Hyperglycemia, unspecified: Secondary | ICD-10-CM | POA: Diagnosis not present

## 2020-11-21 LAB — BASIC METABOLIC PANEL
BUN: 12 mg/dL (ref 6–23)
CO2: 32 mEq/L (ref 19–32)
Calcium: 9.9 mg/dL (ref 8.4–10.5)
Chloride: 100 mEq/L (ref 96–112)
Creatinine, Ser: 0.6 mg/dL (ref 0.40–1.20)
GFR: 99.99 mL/min (ref >60.00)
Glucose, Bld: 86 mg/dL (ref 70–99)
Potassium: 3.4 mEq/L — ABNORMAL LOW (ref 3.5–5.1)
Sodium: 137 mEq/L (ref 135–145)

## 2020-11-21 LAB — TSH: TSH: 0.66 u[IU]/mL (ref 0.35–4.50)

## 2020-11-21 LAB — HEMOGLOBIN A1C: Hgb A1c MFr Bld: 5.7 % (ref 4.6–6.5)

## 2020-11-21 NOTE — Patient Instructions (Signed)
Your provider has requested that you go to the basement level for lab work before leaving today. Press "B" on the elevator. The lab is located at the first door on the left as you exit the elevator.  Due to recent changes in healthcare laws, you may see the results of your imaging and laboratory studies on MyChart before your provider has had a chance to review them.  We understand that in some cases there may be results that are confusing or concerning to you. Not all laboratory results come back in the same time frame and the provider may be waiting for multiple results in order to interpret others.  Please give [place] 48 hours in order for your provider to thoroughly review all the results before contacting the office for clarification of your results.   You have been scheduled for an endoscopy and colonoscopy. Please follow the written instructions given to you at your visit today. Please pick up your prep supplies at the pharmacy within the next 1-3 days. If you use inhalers (even only as needed), please bring them with you on the day of your procedure.  Normal BMI (Body Mass Index- based on height and weight) is between 19 and 25. Your BMI today is Body mass index is 24.37 kg/m. Marland Kitchen Please consider follow up  regarding your BMI with your Primary Care Provider.   I appreciate the opportunity to care for you. Silvano Rusk, MD, [facility]

## 2020-11-21 NOTE — Progress Notes (Signed)
Nicole Crosby 55 y.o. 10-08-1966 [id number]  Assessment & Plan:   Encounter Diagnoses  Name Primary?  . Loss of weight Yes  . Early satiety   . Nausea and vomiting, intractability of vomiting not specified, unspecified vomiting type   . Loose stools   . Hyperglycemia   . Situational stress    She has a long history of functional GI disturbance and that seems likely again though in the past she has not had weight loss.  Looking back as I have outlined her weight has really fluctuated over the past several years.  There are definite situational stressors also.  She does have a history of an adenomatous colon polyp last colonoscopy 2015, I am not suspicious of cancer based upon what we know but she needs an EGD and a colonoscopy to evaluate these problems.  She has been negative for celiac disease testing back in 2009.  Consider biopsies of the upper GI tract but I am not going to repeat celiac antibodies.  Labs as below.  I am puzzled by the hyperglycemia I do not think it is enough to drive her weight loss.  Question if it could be related to the adrenal lesion we will keep that in mind though suspect not.  From a therapeutic standpoint buspirone is out due to previous nightmares and PPIs tend to cause headaches.  She does have ondansetron to use as needed.   Orders Placed This Encounter  Procedures  . Basic metabolic panel  . TSH  . HgB A1c  . Ambulatory referral to Gastroenterology    I appreciate the opportunity to care for this patient. CC: Kathyrn Lass, MD    Subjective:   Chief Complaint: Abdominal pain nausea vomiting weight loss diarrhea  HPI Nicole Crosby is a 55 year old woman with a history of functional dyspepsia last seen by me in 2017, saw Alonza Bogus in 2021.  Other health problems have included hypertension and dyslipidemia.  Over the last year she has had a flare of nausea and vomiting episodically.  Nocturnal symptoms including upper abdominal pain.  She was  in the emergency department in February and she saw Alonza Bogus in our office after that.  Negative abdominal ultrasound in the ED.  Mild leukocytosis question gastroenteritis per ED.  Zofran was helping nausea some.  She was constipated then.  Janett Billow ordered HIDA scan with ejection fraction and she had an elevated ejection fraction.  CT scan of the abdomen and pelvis with contrast in September 2021 ordered by Dr. Sabra Heck, primary care provider, did not reveal cause of weight loss and she had also had some vaginal bleeding so a pelvic ultrasound was done.  That was negative.  She does have an adrenal lesion consistent with a right adrenal adenoma "lipid poor".  That was stable from 2013.  She also had a 4 mm lesion in the liver thought to be a cyst also stable.  She has gone on to have persistent problems and is losing weight.  There is some variable abdominal pain right upper quadrant pain off and on is a problem still.  She has early satiety as well and postprandial distress though not discrete pain.  She says sometimes it feels like her abdomen is stretching.  Bowel movements are loose - watery with mucoid discharge which is a new issue.  She has been hyperglycemic and her hemoglobin A1c was 5.8%.  No previous history of diabetes.  Wt Readings from Last 3 Encounters:  11/21/20 129 lb (58.5  kg)  11/09/20 129 lb (58.5 kg)  09/20/20 131 lb (59.4 kg)   She was 61 kg in February 2021 2020 she was in the 59 kg range and in 2019 she was a peak of 64.4 kg   She is on famotidine daily primary care had suggested going to twice a day but she says her pharmacist told her that would impair absorption of her potassium so she did not go to twice daily dosing.  PPIs tend to give her headaches.  She has tried buspirone in the past but it caused nightmares, when I treated her functional dyspepsia.  Note that she has had significant caregiver burden over the last year her mother was frail and ill and was dying and  she dealt with that her mom did pass away without a will and so Uva is having to deal with that as the lead among her siblings and there are some stressors there.  Over the summer she had a visitor neurology with a trigeminal neuralgia complaint and an MRI to work that up was negative.  That seems to be persistent but better. Allergies  Allergen Reactions  . Buspirone Other (See Comments)     nightmare  . Esomeprazole Other (See Comments)    headache  . Lansoprazole     Abdominal pain  . Omeprazole     Headache  . Proton Pump Inhibitors   . Sulfonamide Derivatives Rash     Rash   Current Meds  Medication Sig  . Calcium Carbonate-Vit D-Min (CALCIUM 1200 PO) Take 1,200 Units by mouth daily.  . cholecalciferol (VITAMIN D) 1000 UNITS tablet Take 1,200 Units by mouth daily.  . clindamycin (CLINDAGEL) 1 % gel APPLY TWICE DAILY TO AFFECTED AREAS IN GROIN AS NEEDED FOR FLARES.  Stasia Cavalier (EUCRISA) 2 % OINT Apply 1 application topically 2 (two) times daily.  . diazepam (VALIUM) 5 MG tablet Take 1 tablet (5 mg total) by mouth every 8 (eight) hours as needed (vertigo).  . famotidine (PEPCID) 20 MG tablet TAKE 1 TABLET BY MOUTH TWICE DAILY  . losartan-hydrochlorothiazide (HYZAAR) 50-12.5 MG tablet TAKE 1 TABLET BY MOUTH DAILY.  . meclizine (ANTIVERT) 25 MG tablet Take 25 mg by mouth 2 (two) times daily as needed for dizziness.   . melatonin 3 MG TABS tablet Take 3 mg by mouth at bedtime as needed.  . methocarbamol (ROBAXIN) 500 MG tablet Take 500 mg by mouth every 6 (six) hours as needed.  . metroNIDAZOLE (METROCREAM) 0.75 % cream APPLY TO THE NOSE TWICE DAILY  . ondansetron (ZOFRAN ODT) 4 MG disintegrating tablet Take 1 tablet (4 mg total) by mouth every 8 (eight) hours as needed for nausea or vomiting.  . OSCIMIN 0.125 MG SUBL PLACE 1 TABLET (0.125 MG TOTAL) UNDER THE TONGUE EVERY 4 (FOUR) HOURS AS NEEDED.  Marland Kitchen potassium chloride SA (KLOR-CON) 20 MEQ tablet TAKE 1 TABLET BY MOUTH TWICE  DAILY  . vitamin B-12 (CYANOCOBALAMIN) 500 MCG tablet Take 500 mcg by mouth daily.   Past Medical History:  Diagnosis Date  . Allergy    Buspirone, Sulfa, Pantoprazole  . Benign fundic gland polyps of stomach 02/12/2016  . Chicken pox   . Functional dyspepsia   . GERD (gastroesophageal reflux disease)   . H/O adenomatous polyp of colon 10/04/2014   09/2014 - 3 mm adenoma - repeat colonoscopy 2022  . Hyperlipidemia    no meds  . Hypertension    pt denies  . IBS (irritable bowel syndrome)   .  Insomnia   . MEDIAL EPICONDYLITIS, RIGHT 07/17/2010  . 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 Other abnormal glucose    Tiny hypodensity left hepatic lobe-CT of abdomen and pelvis 05/01/5283-XLKGM 6mm periumblical hernia  . PAC (premature atrial contraction)   . Palpitations    Past Surgical History:  Procedure Laterality Date  . CESAREAN SECTION  01/2001  . COLONOSCOPY    . 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    Alcohol Use - yes-wine         Full Time    Married           family history includes Arthritis in her sister; COPD in her maternal grandfather; Colon cancer in her maternal uncle; Diabetes in her father, maternal grandmother, sister, and sister; Fibrocystic breast disease in her mother and sister; Hearing loss in her mother; Heart disease in her father; Hyperlipidemia in her father, mother, sister, and sister; Hypertension in her father, mother, sister, sister, and sister; Kidney disease in her father; Osteopenia in her mother; Other in her brother; Prostate cancer in her maternal grandfather; Prostate cancer (age of onset: 71) in her father; Thyroid disease in her sister.   Review of Systems  See HPI Objective:   Physical Exam BP 126/66   Pulse 74   Ht 5\' 1"  (1.549 m)   Wt 129 lb (58.5 kg)   LMP 11/15/2018   BMI 24.37 kg/m  Well-developed well-nourished  white woman no acute distress Eyes anicteric Lungs are clear No thyromegaly Heart sounds are normal S1-S2 no rubs murmurs or gallops The abdomen is soft and nontender and benign overall without organomegaly or mass tenderness.  Bowel sounds are present and there are no bruits.  Appropriate mood and affect.  Alert and oriented x3.

## 2020-11-25 ENCOUNTER — Telehealth: Payer: Self-pay | Admitting: Cardiology

## 2020-11-25 NOTE — Telephone Encounter (Signed)
Pt c/o of Chest Pain: STAT if CP now or developed within 24 hours  1. Are you having CP right now?  Stabbing sharp pain ileft of the center- woke her up last night and it happen 2 times today and left shoulder pain  2. Are you experiencing any other symptoms (ex. SOB, nausea, vomiting, sweating)? Short of breath off and on  3. How long have you been experiencing CP? This episode started last night- it have been going on for a while  4. Is your CP continuous or coming and going?coming and going  5. Have you taken Nitroglycerin?  She does not take Nitroglycerin-pt wants to be seen- I scheduled for a Virtual Visit tomorrow(11-26-20) with Fabian Sharp?

## 2020-11-25 NOTE — Telephone Encounter (Signed)
Spoke with pt who report she's been experiencing off and on chest pain. She report last night two episodes of stabbing sharp pain on her left side that lasted 10-15 seconds. Pt also report two additional episodes today while at work. Pt voiced no aggravating or alleviating factors. Currently denies symptoms.  Appointment scheduled for Thursday 1/27 at 2:15 pm for further evaluations. Pt verbalized understanding to report to ER if symptoms worsen.

## 2020-11-26 ENCOUNTER — Telehealth: Payer: 59 | Admitting: Physician Assistant

## 2020-11-27 ENCOUNTER — Telehealth: Payer: 59 | Admitting: Physician Assistant

## 2020-11-27 NOTE — Progress Notes (Deleted)
Cardiology Office Note:   Date:  11/27/2020  NAME:  Nicole Crosby    MRN: [id number] DOB:  [birth year]   PCP:  Kathyrn Lass, MD  Cardiologist:  No primary care provider on file.  Electrophysiologist:  None   Referring MD: Kathyrn Lass, MD   No chief complaint on file. ***  History of Present Illness:   Nicole Crosby is a 55 y.o. female with a hx of HTN who presents for follow-up of chest pain. Seen by [facility] Breed in the past and has had normal stress tests in 2012, 2015, 2019.    Problem List 1. HTN 2. Chest pain -Negative stress echo 2012 -negative stress echo 2015 -ETT 2019: 9:42; 11.2 METS; negative for ischemia   Past Medical History: Past Medical History:  Diagnosis Date  . Allergy    Buspirone, Sulfa, Pantoprazole  . Benign fundic gland polyps of stomach 02/12/2016  . Chicken pox   . Eczema   . Functional dyspepsia   . GERD (gastroesophageal reflux disease)   . H/O adenomatous polyp of colon 10/04/2014   09/2014 - 3 mm adenoma - repeat colonoscopy 2022  . Hyperlipidemia    no meds  . Hypertension    pt denies  . IBS (irritable bowel syndrome)   . Insomnia   . MEDIAL EPICONDYLITIS, RIGHT 07/17/2010  . 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 Other abnormal glucose    Tiny hypodensity left hepatic lobe-CT of abdomen and pelvis 5/80/9983-JASNK 64mm periumblical hernia  . PAC (premature atrial contraction)   . Palpitations     Past Surgical History: Past Surgical History:  Procedure Laterality Date  . CESAREAN SECTION  01/2001  . COLONOSCOPY    . DE QUERVAIN'S RELEASE  2000   Right wrist area  . ESOPHAGOGASTRODUODENOSCOPY      Current Medications: No outpatient medications have been marked as taking for the 11/28/20 encounter (Appointment) with Geralynn Rile, MD.     Allergies:    Buspirone, Esomeprazole, Lansoprazole, Omeprazole, Proton pump inhibitors, and Sulfonamide derivatives   Social History: Social History   Socioeconomic History  . Marital  status: Married    Spouse name: Not on file  . Number of children: 2  . Years of education: Not on file  . Highest education level: Not on file  Occupational History  . Occupation: Xray/CT Engineer, production: [employer]  Tobacco Use  . Smoking status: Never Smoker  . Smokeless tobacco: Never Used  Vaping Use  . Vaping Use: Never used  Substance and Sexual Activity  . Alcohol use: Yes    Alcohol/week: 1.0 standard drink    Types: 1 Glasses of wine per week    Comment: occasional  . Drug use: No  . Sexual activity: Yes    Birth control/protection: Inserts  Other Topics Concern  . 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    Alcohol Use - yes-wine         Full Time    Married           Social Determinants of Health   Financial Resource Strain: Not on file  Food Insecurity: Not on file  Transportation Needs: Not on file  Physical Activity: Not on file  Stress: Not on file  Social Connections: Not on file     Family History: The patient's ***family history includes Arthritis in her sister; COPD in her maternal grandfather; Colon cancer  in her maternal uncle; Diabetes in her father, maternal grandmother, sister, and sister; Fibrocystic breast disease in her mother and sister; Hearing loss in her mother; Heart disease in her father; Hyperlipidemia in her father, mother, sister, and sister; Hypertension in her father, mother, sister, sister, and sister; Kidney disease in her father; Osteopenia in her mother; Other in her brother; Prostate cancer in her maternal grandfather; Prostate cancer (age of onset: 63) in her father; Thyroid disease in her sister. There is no history of Rectal cancer, Stomach cancer, Esophageal cancer, or Pancreatic cancer.  ROS:   All other ROS reviewed and negative. Pertinent positives noted in the HPI.     EKGs/Labs/Other Studies Reviewed:   The following studies were personally reviewed by  me today:  EKG:  EKG is *** ordered today.  The ekg ordered today demonstrates ***, and was personally reviewed by me.   Recent Labs: 11/09/2020: ALT 15; Hemoglobin 14.1; Platelets 288 11/21/2020: BUN 12; Creatinine, Ser 0.60; Potassium 3.4; Sodium 137; TSH 0.66   Recent Lipid Panel    Component Value Date/Time   CHOL 211 (H) 11/17/2018 0831   TRIG 142.0 11/17/2018 0831   HDL 58.20 11/17/2018 0831   CHOLHDL 4 11/17/2018 0831   VLDL 28.4 11/17/2018 0831   LDLCALC 125 (H) 11/17/2018 0831   LDLCALC 114 (H) 11/03/2017 1615    Physical Exam:   VS:  LMP 11/15/2018    Wt Readings from Last 3 Encounters:  11/21/20 129 lb (58.5 kg)  11/09/20 129 lb (58.5 kg)  09/20/20 131 lb (59.4 kg)    General: Well nourished, well developed, in no acute distress Head: Atraumatic, normal size  Eyes: PEERLA, EOMI  Neck: Supple, no JVD Endocrine: No thryomegaly Cardiac: Normal S1, S2; RRR; no murmurs, rubs, or gallops Lungs: Clear to auscultation bilaterally, no wheezing, rhonchi or rales  Abd: Soft, nontender, no hepatomegaly  Ext: No edema, pulses 2+ Musculoskeletal: No deformities, BUE and BLE strength normal and equal Skin: Warm and dry, no rashes   Neuro: Alert and oriented to person, place, time, and situation, CNII-XII grossly intact, no focal deficits  Psych: Normal mood and affect   ASSESSMENT:   Nicole Crosby is a 55 y.o. female who presents for the following: No diagnosis found.  PLAN:   There are no diagnoses linked to this encounter.  Disposition: No follow-ups on file.  Medication Adjustments/Labs and Tests Ordered: Current medicines are reviewed at length with the patient today.  Concerns regarding medicines are outlined above.  No orders of the defined types were placed in this encounter.  No orders of the defined types were placed in this encounter.   There are no Patient Instructions on file for this visit.   Time Spent with Patient: I have spent a total of ***  minutes with patient reviewing hospital notes, telemetry, EKGs, labs and examining the patient as well as establishing an assessment and plan that was discussed with the patient.  > 50% of time was spent in direct patient care.  Signed, Addison Naegeli. Audie Box, [place]  [address] [place], [place] [address] [phone]  11/27/2020 3:28 PM

## 2020-11-28 ENCOUNTER — Ambulatory Visit: Payer: 59 | Admitting: Medical

## 2020-11-28 ENCOUNTER — Telehealth: Payer: Self-pay | Admitting: Cardiology

## 2020-11-28 ENCOUNTER — Ambulatory Visit: Payer: 59 | Admitting: Cardiovascular Disease

## 2020-11-28 DIAGNOSIS — I10 Essential (primary) hypertension: Secondary | ICD-10-CM

## 2020-11-28 DIAGNOSIS — R079 Chest pain, unspecified: Secondary | ICD-10-CM

## 2020-11-28 NOTE — Telephone Encounter (Signed)
Patient states she just started having covid symptoms today, but has an appointment at 3:00 pm with Dr. Audie Box. She states she was told she needed to be seen this week, but does not feel comfortable coming into the office. She states she works for [employer] and is around [employer] everyday.

## 2020-11-28 NOTE — Telephone Encounter (Signed)
Agree.   [place] T. Audie Box, MD, [place]  [address] [place], [place] [address] [phone]  4:29 PM

## 2020-11-28 NOTE — Telephone Encounter (Signed)
Attempted to call patient, left message for patient to call back to office.   

## 2020-11-28 NOTE — Telephone Encounter (Signed)
Called patient, she will cancel appointment today- she is having covid symptoms will be tested today and awaiting results per [facility].  She is not currently having any chest pains now, and states her symptoms are improving- she was advised of when to report to urgent care/ed for severe chest pains, patient states she is okay to wait to see what covid results are and will monitor any chest pain and other symptoms- will call back to try to be seen next week once she gets covid results.   Will route to MD's to make them aware.

## 2020-12-10 NOTE — Progress Notes (Signed)
HPI: FU dyspnea and chest pain. Echocardiogram in August of 2010 revealed normal LV function and no valvular abnormalities. Stress echocardiogram December 2010 normal. CardioNet 2010 showed sinus to sinus tachycardia with rare PAC. Stress echo 6/15 normal. ETT 11/19 negative adequate. Contacted the office recently with chest pain.  Patient states that she has had occasional chest pain that is substernal.  It does not radiate.  No associated symptoms.  Described as a stabbing pain lasting minutes.  Not exertional.  She has dyspnea with more vigorous activities but not routine activities.  No orthopnea, PND or pedal edema.  No syncope.  Current Outpatient Medications  Medication Sig Dispense Refill  . Calcium Carbonate-Vit D-Min (CALCIUM 1200 PO) Take 1,200 Units by mouth daily.    . cholecalciferol (VITAMIN D) 1000 UNITS tablet Take 1,200 Units by mouth daily.    . clindamycin (CLINDAGEL) 1 % gel APPLY TWICE DAILY TO AFFECTED AREAS IN GROIN AS NEEDED FOR FLARES.    Stasia Cavalier (EUCRISA) 2 % OINT Apply 1 application topically 2 (two) times daily. 60 g 1  . diazepam (VALIUM) 5 MG tablet Take 1 tablet (5 mg total) by mouth every 8 (eight) hours as needed (vertigo). 8 tablet 0  . famotidine (PEPCID) 20 MG tablet TAKE 1 TABLET BY MOUTH TWICE DAILY 60 tablet 1  . losartan-hydrochlorothiazide (HYZAAR) 50-12.5 MG tablet TAKE 1 TABLET BY MOUTH DAILY. 90 tablet 0  . meclizine (ANTIVERT) 25 MG tablet Take 25 mg by mouth 2 (two) times daily as needed for dizziness.     . melatonin 3 MG TABS tablet Take 3 mg by mouth at bedtime as needed.    . methocarbamol (ROBAXIN) 500 MG tablet Take 500 mg by mouth every 6 (six) hours as needed.    . metroNIDAZOLE (METROCREAM) 0.75 % cream APPLY TO THE NOSE TWICE DAILY    . ondansetron (ZOFRAN ODT) 4 MG disintegrating tablet Take 1 tablet (4 mg total) by mouth every 8 (eight) hours as needed for nausea or vomiting. 20 tablet 0  . OSCIMIN 0.125 MG SUBL PLACE 1 TABLET  (0.125 MG TOTAL) UNDER THE TONGUE EVERY 4 (FOUR) HOURS AS NEEDED. 60 tablet 0  . potassium chloride SA (KLOR-CON) 20 MEQ tablet TAKE 1 TABLET BY MOUTH TWICE DAILY 60 tablet 6  . vitamin B-12 (CYANOCOBALAMIN) 500 MCG tablet Take 500 mcg by mouth daily.     No current facility-administered medications for this visit.     Past Medical History:  Diagnosis Date  . Allergy    Buspirone, Sulfa, Pantoprazole  . Benign fundic gland polyps of stomach 02/12/2016  . Chicken pox   . Eczema   . Functional dyspepsia   . GERD (gastroesophageal reflux disease)   . H/O adenomatous polyp of colon 10/04/2014   09/2014 - 3 mm adenoma - repeat colonoscopy 2022  . Hyperlipidemia    no meds  . Hypertension    pt denies  . IBS (irritable bowel syndrome)   . Insomnia   . MEDIAL EPICONDYLITIS, RIGHT 07/17/2010  . 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 Other abnormal glucose    Tiny hypodensity left hepatic lobe-CT of abdomen and pelvis 4/33/2951-OACZY 85mm periumblical hernia  . PAC (premature atrial contraction)   . Palpitations     Past Surgical History:  Procedure Laterality Date  . CESAREAN SECTION  01/2001  . COLONOSCOPY    . DE QUERVAIN'S RELEASE  2000   Right wrist area  . ESOPHAGOGASTRODUODENOSCOPY  Social History   Socioeconomic History  . Marital status: Married    Spouse name: Not on file  . Number of children: 2  . Years of education: Not on file  . Highest education level: Not on file  Occupational History  . Occupation: Xray/CT Engineer, production: [employer]  Tobacco Use  . Smoking status: Never Smoker  . Smokeless tobacco: Never Used  Vaping Use  . Vaping Use: Never used  Substance and Sexual Activity  . Alcohol use: Yes    Alcohol/week: 1.0 standard drink    Types: 1 Glasses of wine per week    Comment: occasional  . Drug use: No  . Sexual activity: Yes    Birth control/protection: Inserts  Other Topics Concern  . 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    Alcohol Use - yes-wine         Full Time    Married           Social Determinants of Health   Financial Resource Strain: Not on file  Food Insecurity: Not on file  Transportation Needs: Not on file  Physical Activity: Not on file  Stress: Not on file  Social Connections: Not on file  Intimate Partner Violence: Not on file    Family History  Problem Relation Age of Onset  . Hypertension Mother   . Hyperlipidemia Mother   . Fibrocystic breast disease Mother   . Hearing loss Mother   . Osteopenia Mother   . Prostate cancer Father 28       deceased  . Hypertension Father   . Diabetes Father   . Kidney disease Father   . Heart disease Father        MI  . Hyperlipidemia Father   . Other Brother        unknown  . Thyroid disease Sister   . Diabetes Sister        pre-diabetes  . Hypertension Sister   . Arthritis Sister   . Diabetes Sister   . Hypertension Sister   . Hyperlipidemia Sister   . Fibrocystic breast disease Sister   . Colon cancer Maternal Uncle        died in late 45's  . Diabetes Maternal Grandmother   . Prostate cancer Maternal Grandfather   . COPD Maternal Grandfather   . Hyperlipidemia Sister   . Hypertension Sister   . Rectal cancer Neg Hx   . Stomach cancer Neg Hx   . Esophageal cancer Neg Hx   . Pancreatic cancer Neg Hx     ROS: no fevers or chills, productive cough, hemoptysis, dysphasia, odynophagia, melena, hematochezia, dysuria, hematuria, rash, seizure activity, orthopnea, PND, pedal edema, claudication. Remaining systems are negative.  Physical Exam: Well-developed well-nourished in no acute distress.  Skin is warm and dry.  HEENT is normal.  Neck is supple.  Chest is clear to auscultation with normal expansion.  Cardiovascular exam is regular rate and rhythm.  Abdominal exam nontender or distended. No masses palpated. Extremities show no edema. neuro grossly intact  ECG-normal  sinus rhythm at a rate of 80, no ST changes.  Personally reviewed  A/P  1 chest pain-symptoms are atypical.  Electrocardiogram shows no ST changes.  We will arrange a CTA to rule out obstructive coronary disease.  2 hypertension-blood pressure controlled.  Continue present medications and follow.  Kirk Ruths, MD

## 2020-12-11 ENCOUNTER — Ambulatory Visit (INDEPENDENT_AMBULATORY_CARE_PROVIDER_SITE_OTHER): Payer: 59 | Admitting: Cardiology

## 2020-12-11 ENCOUNTER — Other Ambulatory Visit: Payer: Self-pay | Admitting: Cardiology

## 2020-12-11 ENCOUNTER — Other Ambulatory Visit: Payer: Self-pay

## 2020-12-11 ENCOUNTER — Encounter: Payer: Self-pay | Admitting: Cardiology

## 2020-12-11 VITALS — BP 122/75 | HR 79 | Ht 61.0 in | Wt 129.2 lb

## 2020-12-11 DIAGNOSIS — R072 Precordial pain: Secondary | ICD-10-CM | POA: Diagnosis not present

## 2020-12-11 DIAGNOSIS — I10 Essential (primary) hypertension: Secondary | ICD-10-CM

## 2020-12-11 MED ORDER — METOPROLOL TARTRATE 100 MG PO TABS: ORAL_TABLET | ORAL | 0 refills | Status: DC

## 2020-12-11 MED FILL — METOPROLOL TARTRATE 100 MG: 100 | 1 days supply | Qty: 1 | Fill #0

## 2020-12-11 NOTE — Patient Instructions (Signed)
Your cardiac CT will be scheduled at one of the below locations:   [facility] [address] [place], [place] [address] [phone]  If scheduled at [facility], please arrive at the [facility] main entrance (entrance A) of [facility] 30 minutes prior to test start time. Proceed to the [facility] Radiology Department (first floor) to check-in and test prep.   Please follow these instructions carefully (unless otherwise directed):  On the Night Before the Test: . Be sure to Drink plenty of water. . Do not consume any caffeinated/decaffeinated beverages or chocolate 12 hours prior to your test. . Do not take any antihistamines 12 hours prior to your test.   On the Day of the Test: . Drink plenty of water until 1 hour prior to the test. . Do not eat any food 4 hours prior to the test. . You may take your regular medications prior to the test.  . Take metoprolol (Lopressor) 100 mg two hours prior to test. . HOLD Furosemide/Hydrochlorothiazide morning of the test. . FEMALES- please wear underwire-free bra if available    After the Test: . Drink plenty of water. . After receiving IV contrast, you may experience a mild flushed feeling. This is normal. . On occasion, you may experience a mild rash up to 24 hours after the test. This is not dangerous. If this occurs, you can take Benadryl 25 mg and increase your fluid intake. . If you experience trouble breathing, this can be serious. If it is severe call 911 IMMEDIATELY. If it is mild, please call our office. . If you take any of these medications: Glipizide/Metformin, Avandament, Glucavance, please do not take 48 hours after completing test unless otherwise instructed.   Once we have confirmed authorization from your insurance company, we will call you to set up a date and time for your test. Based on how quickly your insurance processes prior authorizations requests, please allow up to 4 weeks to be  contacted for scheduling your Cardiac CT appointment. Be advised that routine Cardiac CT appointments could be scheduled as many as 8 weeks after your provider has ordered it.  For non-scheduling related questions, please contact the cardiac imaging nurse navigator should you have any questions/concerns: Marchia Bond, Cardiac Imaging Nurse Navigator Gordy Clement, Cardiac Imaging Nurse Navigator [facility] Direct Office Dial: [phone]   For scheduling needs, including cancellations and rescheduling, please call [place], [phone].   Your physician wants you to follow-up in: [place] will receive a reminder letter in the mail two months in advance. If you don't receive a letter, please call our office to schedule the follow-up appointment.

## 2020-12-16 MED FILL — LOSARTAN-HCTZ 50-12.5 MG TA: 50-12.5 | 90 days supply | Qty: 90 | Fill #1

## 2020-12-17 ENCOUNTER — Ambulatory Visit: Payer: 59 | Admitting: Cardiology

## 2020-12-23 ENCOUNTER — Ambulatory Visit (HOSPITAL_COMMUNITY): Payer: 59

## 2020-12-24 ENCOUNTER — Telehealth (HOSPITAL_COMMUNITY): Payer: Self-pay | Admitting: *Deleted

## 2020-12-24 NOTE — Telephone Encounter (Signed)
Reaching out to patient to offer assistance regarding upcoming cardiac imaging study; pt verbalizes understanding of appt date/time, parking situation and where to check in, pre-test NPO status and medications ordered, and verified current allergies; name and call back number provided for further questions should they arise  Lavine Hargrove RN Navigator Cardiac Imaging [facility] [phone] office [phone] cell  

## 2020-12-26 ENCOUNTER — Ambulatory Visit (HOSPITAL_COMMUNITY)
Admission: RE | Admit: 2020-12-26 | Discharge: 2020-12-26 | Disposition: A | Discharge status: Home / Self Care | Payer: 59 | Source: Ambulatory Visit | Attending: Cardiology | Admitting: Cardiology

## 2020-12-26 ENCOUNTER — Other Ambulatory Visit: Payer: Self-pay

## 2020-12-26 DIAGNOSIS — R072 Precordial pain: Secondary | ICD-10-CM | POA: Insufficient documentation

## 2020-12-26 MED ORDER — NITROGLYCERIN 0.4 MG SL SUBL
SUBLINGUAL_TABLET | SUBLINGUAL | Status: AC
  Filled 2020-12-26: qty 2

## 2020-12-26 MED ORDER — IOHEXOL 350 MG/ML SOLN
80.0000 mL | Freq: Once | INTRAVENOUS | Status: AC | PRN
  Administered 2020-12-26: 80 mL via INTRAVENOUS

## 2020-12-26 MED ORDER — NITROGLYCERIN 0.4 MG SL SUBL
0.8000 mg | SUBLINGUAL_TABLET | Freq: Once | SUBLINGUAL | Status: AC
  Administered 2020-12-26: 0.8 mg via SUBLINGUAL

## 2020-12-27 ENCOUNTER — Telehealth: Payer: Self-pay | Admitting: *Deleted

## 2020-12-27 ENCOUNTER — Other Ambulatory Visit: Payer: Self-pay | Admitting: Cardiology

## 2020-12-27 DIAGNOSIS — R931 Abnormal findings on diagnostic imaging of heart and coronary circulation: Secondary | ICD-10-CM

## 2020-12-27 MED ORDER — ROSUVASTATIN CALCIUM 20 MG PO TABS: 20.0000 mg | ORAL_TABLET | Freq: Every day | ORAL | 3 refills | Status: DC

## 2020-12-27 MED FILL — ROSUVASTATIN CALCIUM 20 MG: 20 | 90 days supply | Qty: 90 | Fill #0

## 2020-12-27 NOTE — Telephone Encounter (Signed)
Spoke with pt, Aware of dr crenshaw's recommendations. New script sent to the pharmacy and Lab orders mailed to the pt  

## 2020-12-27 NOTE — Telephone Encounter (Signed)
-----   Message from Lelon Perla, MD sent at 12/27/2020  7:14 AM EST ----- Minimal, nonobstructive CAD; add crestor 20 mg daily; lipids and liver 12 weeks Kirk Ruths

## 2020-12-27 NOTE — Telephone Encounter (Signed)
Patient is returning call.  °

## 2020-12-27 NOTE — Telephone Encounter (Signed)
Left message for pt to call.

## 2020-12-30 ENCOUNTER — Other Ambulatory Visit: Payer: Self-pay | Admitting: Internal Medicine

## 2020-12-30 MED FILL — FAMOTIDINE 20 MG TABLET: 20 | 30 days supply | Qty: 60 | Fill #0

## 2021-01-09 DIAGNOSIS — L309 Dermatitis, unspecified: Secondary | ICD-10-CM | POA: Diagnosis not present

## 2021-01-09 DIAGNOSIS — L814 Other melanin hyperpigmentation: Secondary | ICD-10-CM | POA: Diagnosis not present

## 2021-01-09 DIAGNOSIS — D485 Neoplasm of uncertain behavior of skin: Secondary | ICD-10-CM | POA: Diagnosis not present

## 2021-01-09 DIAGNOSIS — C44319 Basal cell carcinoma of skin of other parts of face: Secondary | ICD-10-CM | POA: Diagnosis not present

## 2021-01-22 DIAGNOSIS — D229 Melanocytic nevi, unspecified: Secondary | ICD-10-CM | POA: Diagnosis not present

## 2021-01-22 DIAGNOSIS — L814 Other melanin hyperpigmentation: Secondary | ICD-10-CM | POA: Diagnosis not present

## 2021-01-31 ENCOUNTER — Encounter: Payer: Self-pay | Admitting: Internal Medicine

## 2021-01-31 ENCOUNTER — Other Ambulatory Visit: Payer: Self-pay

## 2021-01-31 ENCOUNTER — Ambulatory Visit (AMBULATORY_SURGERY_CENTER): Payer: 59 | Admitting: Internal Medicine

## 2021-01-31 VITALS — BP 147/82 | HR 62 | Temp 96.8°F | Resp 13 | Ht 61.0 in | Wt 129.0 lb

## 2021-01-31 DIAGNOSIS — K6289 Other specified diseases of anus and rectum: Secondary | ICD-10-CM | POA: Diagnosis not present

## 2021-01-31 DIAGNOSIS — R634 Abnormal weight loss: Secondary | ICD-10-CM

## 2021-01-31 DIAGNOSIS — K317 Polyp of stomach and duodenum: Secondary | ICD-10-CM

## 2021-01-31 DIAGNOSIS — R1013 Epigastric pain: Secondary | ICD-10-CM | POA: Diagnosis not present

## 2021-01-31 DIAGNOSIS — R112 Nausea with vomiting, unspecified: Secondary | ICD-10-CM

## 2021-01-31 DIAGNOSIS — R195 Other fecal abnormalities: Secondary | ICD-10-CM

## 2021-01-31 DIAGNOSIS — R197 Diarrhea, unspecified: Secondary | ICD-10-CM

## 2021-01-31 MED ORDER — SODIUM CHLORIDE 0.9 % IV SOLN
500.0000 mL | Freq: Once | INTRAVENOUS | Status: DC
Start: 2021-01-31 — End: 2022-12-06

## 2021-01-31 NOTE — Op Note (Signed)
[facility] Patient Name: Nicole Crosby Procedure Date: 01/31/2021 9:43 AM MRN: [id number] Endoscopist: Gatha Mayer , MD Age: 55 Referring MD:  Date of Birth: 15-Mar-1966 Gender: Female Account #: [id number] Procedure:                Upper GI endoscopy Indications:              Functional Dyspepsia, Weight loss Medicines:                Propofol per Anesthesia, Monitored Anesthesia Care Procedure:                Pre-Anesthesia Assessment:                           - Prior to the procedure, a History and Physical                            was performed, and patient medications and                            allergies were reviewed. The patient's tolerance of                            previous anesthesia was also reviewed. The risks                            and benefits of the procedure and the sedation                            options and risks were discussed with the patient.                            All questions were answered, and informed consent                            was obtained. Prior Anticoagulants: The patient has                            taken no previous anticoagulant or antiplatelet                            agents. ASA Grade Assessment: II - A patient with                            mild systemic disease. After reviewing the risks                            and benefits, the patient was deemed in                            satisfactory condition to undergo the procedure.                           After obtaining informed consent, the endoscope was  passed under direct vision. Throughout the                            procedure, the patient's blood pressure, pulse, and                            oxygen saturations were monitored continuously. The                            Endoscope was introduced through the mouth, and                            advanced to the second part of duodenum. The upper                             GI endoscopy was accomplished without difficulty.                            The patient tolerated the procedure well. Scope In: Scope Out: Findings:                 A few diminutive sessile polyps were found in the                            cardia, in the gastric fundus and in the gastric                            body. Biopsies were taken with a cold forceps for                            histology. Verification of patient identification                            for the specimen was done. Estimated blood loss was                            minimal.                           The exam was otherwise without abnormality.                           The cardia and gastric fundus were normal on                            retroflexion. Complications:            No immediate complications. Estimated Blood Loss:     Estimated blood loss was minimal. Impression:               - A few gastric polyps. Biopsied.                           - The examination was otherwise normal. Recommendation:           - Patient has a contact number available  for                            emergencies. The signs and symptoms of potential                            delayed complications were discussed with the                            patient. Return to normal activities tomorrow.                            Written discharge instructions were provided to the                            patient.                           - Resume previous diet.                           - Continue present medications.                           - See the other procedure note for documentation of                            additional recommendations. Will consider                            duloxetine vs mirtazapine for sxs Gatha Mayer, MD 01/31/2021 10:22:39 AM This report has been signed electronically.

## 2021-01-31 NOTE — Progress Notes (Signed)
Medical history reviewed with no changes noted. VS assessed by C.W 

## 2021-01-31 NOTE — Patient Instructions (Addendum)
There were some stomach polyps again and I sampled them with biopsies to see if they were changing though I doubt it.  The colonoscopy did not reveal any polyps or inflammation.  You do have some hypertrophied anal papilla a which is what you feel or call that speed bump.  These are some thickened areas of skin and not of clinical concern.  In patients with your situation I usually end up treating with medication in a class called neuromodulators.  I do not have an explanation for why you feel the way you do but it seems to be in the muscles and nerves of your gut.  There are 2 medications that I do not think we have tried that could be tried (choose one).  Let us think about the possibility of duloxetine which is generic Cymbalta which has indications to help chronic pain, also anxiety and depression.  It can help people with functional dyspepsia which you have suffered with.  Another option would be a drug called mirtazapine which is generic Remeron which reduces nausea promotes sleep and increase his appetite.  It does not tend to help pain is much in my experience though it can.  Think about those and I will contact you after the polyp biopsies are back.  I am not anticipating a problem with them.  I appreciate the opportunity to care for you. Gatha Mayer, MD, [facility]  Resume previous diet. Continue present medications.  YOU HAD AN ENDOSCOPIC PROCEDURE TODAY AT [facility]:   Refer to the procedure report that was given to you for any specific questions about what was found during the examination.  If the procedure report does not answer your questions, please call your gastroenterologist to clarify.  If you requested that your care partner not be given the details of your procedure findings, then the procedure report has been included in a sealed envelope for you to review at your convenience later.  YOU SHOULD EXPECT: Some feelings of bloating in the abdomen. Passage of  more gas than usual.  Walking can help get rid of the air that was put into your GI tract during the procedure and reduce the bloating. If you had a lower endoscopy (such as a colonoscopy or flexible sigmoidoscopy) you may notice spotting of blood in your stool or on the toilet paper. If you underwent a bowel prep for your procedure, you may not have a normal bowel movement for a few days.  Please Note:  You might notice some irritation and congestion in your nose or some drainage.  This is from the oxygen used during your procedure.  There is no need for concern and it should clear up in a day or so.  SYMPTOMS TO REPORT IMMEDIATELY:   Following lower endoscopy (colonoscopy or flexible sigmoidoscopy):  Excessive amounts of blood in the stool  Significant tenderness or worsening of abdominal pains  Swelling of the abdomen that is new, acute  Fever of 100F or higher   Following upper endoscopy (EGD)  Vomiting of blood or coffee ground material  New chest pain or pain under the shoulder blades  Painful or persistently difficult swallowing  New shortness of breath  Fever of 100F or higher  Black, tarry-looking stools  For urgent or emergent issues, a gastroenterologist can be reached at any hour by calling [phone]. Do not use MyChart messaging for urgent concerns.    DIET:  We do recommend a small meal at first, but then  you may proceed to your regular diet.  Drink plenty of fluids but you should avoid alcoholic beverages for 24 hours.  ACTIVITY:  You should plan to take it easy for the rest of today and you should NOT DRIVE or use heavy machinery until tomorrow (because of the sedation medicines used during the test).    FOLLOW UP: Our staff will call the number listed on your records 48-72 hours following your procedure to check on you and address any questions or concerns that you may have regarding the information given to you following your procedure. If we do not reach you,  we will leave a message.  We will attempt to reach you two times.  During this call, we will ask if you have developed any symptoms of COVID 19. If you develop any symptoms (ie: fever, flu-like symptoms, shortness of breath, cough etc.) before then, please call [phone].  If you test positive for Covid 19 in the 2 weeks post procedure, please call and report this information to [place].    If any biopsies were taken you will be contacted by phone or by letter within the next 1-3 weeks.  Please call us at [phone] if you have not heard about the biopsies in 3 weeks.    SIGNATURES/CONFIDENTIALITY: You and/or your care partner have signed paperwork which will be entered into your electronic medical record.  These signatures attest to the fact that that the information above on your After Visit Summary has been reviewed and is understood.  Full responsibility of the confidentiality of this discharge information lies with you and/or your care-partner.

## 2021-01-31 NOTE — Progress Notes (Signed)
Report given to PACU, vss 

## 2021-01-31 NOTE — Progress Notes (Signed)
0953 Robinul 0.1 mg IV given due large amount of secretions upon assessment.  MD made aware, vss  

## 2021-01-31 NOTE — Op Note (Signed)
[facility] Patient Name: Nicole Crosby Procedure Date: 01/31/2021 9:42 AM MRN: [id number] Endoscopist: Gatha Mayer , MD Age: 55 Referring MD:  Date of Birth: 1966/08/02 Gender: Female Account #: [id number] Procedure:                Colonoscopy Indications:              Change in bowel habits - loose stools and weight                            loss Medicines:                Propofol per Anesthesia, Monitored Anesthesia Care Procedure:                Pre-Anesthesia Assessment:                           - Prior to the procedure, a History and Physical                            was performed, and patient medications and                            allergies were reviewed. The patient's tolerance of                            previous anesthesia was also reviewed. The risks                            and benefits of the procedure and the sedation                            options and risks were discussed with the patient.                            All questions were answered, and informed consent                            was obtained. Prior Anticoagulants: The patient has                            taken no previous anticoagulant or antiplatelet                            agents. ASA Grade Assessment: II - A patient with                            mild systemic disease. After reviewing the risks                            and benefits, the patient was deemed in                            satisfactory condition to undergo the procedure.  After obtaining informed consent, the colonoscope                            was passed under direct vision. Throughout the                            procedure, the patient's blood pressure, pulse, and                            oxygen saturations were monitored continuously. The                            Olympus [id number]-[id number] (#[id number]) Colonoscope was                            introduced through the anus and  advanced to the the                            terminal ileum, with identification of the                            appendiceal orifice and IC valve. The terminal                            ileum, the appendiceal orifice and the rectum were                            photographed. The quality of the bowel preparation                            was excellent. The colonoscopy was performed                            without difficulty. The patient tolerated the                            procedure well. The bowel preparation used was                            Miralax via split dose instruction. Scope In: 10:05:24 AM Scope Out: 10:13:37 AM Scope Withdrawal Time: 0 hours 5 minutes 24 seconds  Total Procedure Duration: 0 hours 8 minutes 13 seconds  Findings:                 The perianal and digital rectal examinations were                            normal.                           Anal papilla(e) were hypertrophied.                           The terminal ileum appeared normal.  The exam was otherwise without abnormality on                            direct and retroflexion views. Complications:            No immediate complications. Estimated blood loss:                            None. Estimated Blood Loss:     Estimated blood loss: none. Impression:               - Anal papilla(e) were hypertrophied.                           - The examined portion of the ileum was normal.                           - The examination was otherwise normal on direct                            and retroflexion views.                           - No specimens collected.                           - Personal history of colonic polyp - diminutive                            adenoma 2015. Recommendation:           - Repeat colonoscopy in 10 years.                           - Patient has a contact number available for                            emergencies. The signs and symptoms of  potential                            delayed complications were discussed with the                            patient. Return to normal activities tomorrow.                            Written discharge instructions were provided to the                            patient.                           - Resume previous diet.                           - Continue present medications.                           -  See the other procedure note for documentation of                            additional recommendations. Gatha Mayer, MD 01/31/2021 10:25:38 AM This report has been signed electronically.

## 2021-01-31 NOTE — Progress Notes (Signed)
Prior to discharge pt. Said she thought she had a corneal abrasion of her left eye.  O.S. was not watering and had not even a hint redness. Told pt. Her cornea might be dry following her sedation if her eye had not been completely closed.  Pt. 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  Told pt. To use her AT's as soon as she could access them, and to use them frequently.

## 2021-01-31 NOTE — Progress Notes (Signed)
Called to room to assist during endoscopic procedure.  Patient ID and intended procedure confirmed with present staff. Received instructions for my participation in the procedure from the performing physician.  

## 2021-02-04 ENCOUNTER — Telehealth: Payer: Self-pay

## 2021-02-04 NOTE — Telephone Encounter (Signed)
NO ANSWER, MESSAGE LEFT FOR PATIENT. 

## 2021-02-04 NOTE — Telephone Encounter (Signed)
  Follow up Call-  Call back number 01/31/2021  Post procedure Call Back phone  # [phone]  Permission to leave phone message Yes  Some recent data might be hidden     Patient questions:  Do you have a fever, pain , or abdominal swelling? No. Pain Score  0 *  Have you tolerated food without any problems? Yes.    Have you been able to return to your normal activities? Yes.    Do you have any questions about your discharge instructions: Diet   No. Medications  No. Follow up visit  No.  Do you have questions or concerns about your Care? No.  Actions: * If pain score is 4 or above: No action needed, pain <4.  1. Have you developed a fever since your procedure? no  2.   Have you had an respiratory symptoms (SOB or cough) since your procedure? no  3.   Have you tested positive for COVID 19 since your procedure no  4.   Have you had any family members/close contacts diagnosed with the COVID 19 since your procedure?  no   If yes to any of these questions please route to Joylene John, RN and Joella Prince, RN

## 2021-02-10 DIAGNOSIS — C4491 Basal cell carcinoma of skin, unspecified: Secondary | ICD-10-CM | POA: Insufficient documentation

## 2021-02-10 DIAGNOSIS — C44319 Basal cell carcinoma of skin of other parts of face: Secondary | ICD-10-CM | POA: Diagnosis not present

## 2021-02-16 MED FILL — Potassium Chloride Microencapsulated Crys ER Tab 20 mEq: ORAL | 90 days supply | Qty: 180 | Fill #0 | Status: AC

## 2021-02-17 ENCOUNTER — Other Ambulatory Visit (HOSPITAL_BASED_OUTPATIENT_CLINIC_OR_DEPARTMENT_OTHER): Payer: Self-pay

## 2021-02-24 DIAGNOSIS — H9041 Sensorineural hearing loss, unilateral, right ear, with unrestricted hearing on the contralateral side: Secondary | ICD-10-CM | POA: Diagnosis not present

## 2021-03-12 ENCOUNTER — Other Ambulatory Visit (HOSPITAL_BASED_OUTPATIENT_CLINIC_OR_DEPARTMENT_OTHER): Payer: Self-pay

## 2021-03-12 DIAGNOSIS — L2084 Intrinsic (allergic) eczema: Secondary | ICD-10-CM | POA: Diagnosis not present

## 2021-03-12 DIAGNOSIS — L718 Other rosacea: Secondary | ICD-10-CM | POA: Diagnosis not present

## 2021-03-12 MED ORDER — HYDROCORTISONE 2.5 % EX OINT
TOPICAL_OINTMENT | CUTANEOUS | 0 refills | Status: DC
  Filled 2021-03-12: qty 28.35, 15d supply, fill #0

## 2021-03-16 ENCOUNTER — Other Ambulatory Visit (HOSPITAL_BASED_OUTPATIENT_CLINIC_OR_DEPARTMENT_OTHER): Payer: Self-pay

## 2021-03-17 ENCOUNTER — Other Ambulatory Visit (HOSPITAL_BASED_OUTPATIENT_CLINIC_OR_DEPARTMENT_OTHER): Payer: Self-pay

## 2021-03-17 MED ORDER — LOSARTAN POTASSIUM-HCTZ 50-12.5 MG PO TABS
1.0000 | ORAL_TABLET | Freq: Every day | ORAL | 1 refills | Status: DC
  Filled 2021-03-17: qty 90, 90d supply, fill #0
  Filled 2021-06-22: qty 90, 90d supply, fill #1

## 2021-03-18 ENCOUNTER — Other Ambulatory Visit (HOSPITAL_BASED_OUTPATIENT_CLINIC_OR_DEPARTMENT_OTHER): Payer: Self-pay

## 2021-03-18 MED ORDER — LOSARTAN POTASSIUM-HCTZ 50-12.5 MG PO TABS
1.0000 | ORAL_TABLET | Freq: Every day | ORAL | 1 refills | Status: DC
  Filled 2021-03-18: qty 90, 90d supply, fill #0

## 2021-03-20 ENCOUNTER — Other Ambulatory Visit (HOSPITAL_BASED_OUTPATIENT_CLINIC_OR_DEPARTMENT_OTHER): Payer: Self-pay

## 2021-03-20 MED ORDER — CARESTART COVID-19 HOME TEST VI KIT
PACK | 0 refills | Status: DC
  Filled 2021-03-20: qty 4, 4d supply, fill #0

## 2021-03-26 ENCOUNTER — Other Ambulatory Visit (HOSPITAL_BASED_OUTPATIENT_CLINIC_OR_DEPARTMENT_OTHER): Payer: Self-pay

## 2021-04-17 DIAGNOSIS — U071 COVID-19: Secondary | ICD-10-CM | POA: Diagnosis not present

## 2021-04-21 ENCOUNTER — Other Ambulatory Visit (HOSPITAL_BASED_OUTPATIENT_CLINIC_OR_DEPARTMENT_OTHER): Payer: Self-pay

## 2021-04-21 MED ORDER — CARESTART COVID-19 HOME TEST VI KIT
PACK | 0 refills | Status: DC
  Filled 2021-04-21: qty 4, 4d supply, fill #0

## 2021-04-25 ENCOUNTER — Other Ambulatory Visit (HOSPITAL_BASED_OUTPATIENT_CLINIC_OR_DEPARTMENT_OTHER): Payer: Self-pay

## 2021-05-18 IMAGING — CT CT HEAD W/O CM
3 series · 16 of 47 positions shown, 19 images · non-contrast
Comparison: Brain MRI 10/10/2018.  Head CT scan 07/15/2018.

CLINICAL DATA: Onset jaw pain and tingling in the lips at 11 a.m.
this morning. The patient episode of similar symptoms 5 days ago.

EXAM:
CT HEAD WITHOUT CONTRAST
TECHNIQUE: Contiguous axial images were obtained from the base of the skull
through the vertex without intravenous contrast.

[Series 2: head wo · axial · 0.36mm/px · z∈[-206,-81]mm · 10 of 31 slices shown, 13 images]
[im 3/31  brain]
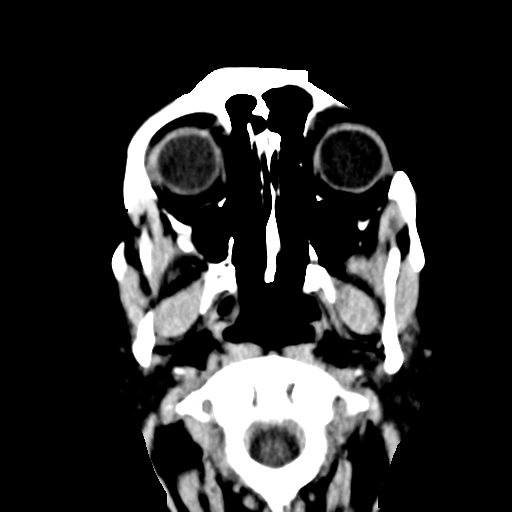
[im 3/31  bone]
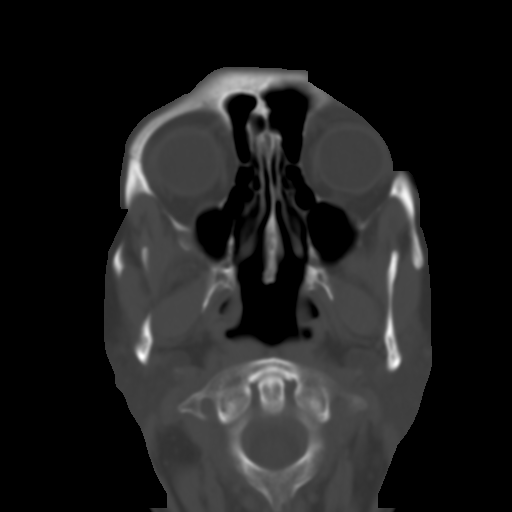
[im 6/31  brain]
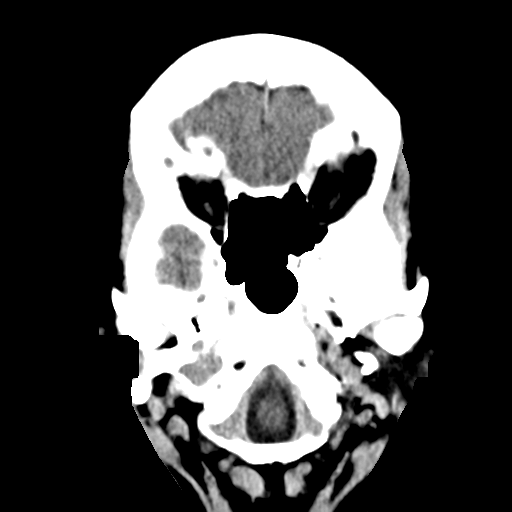
[im 9/31  brain]
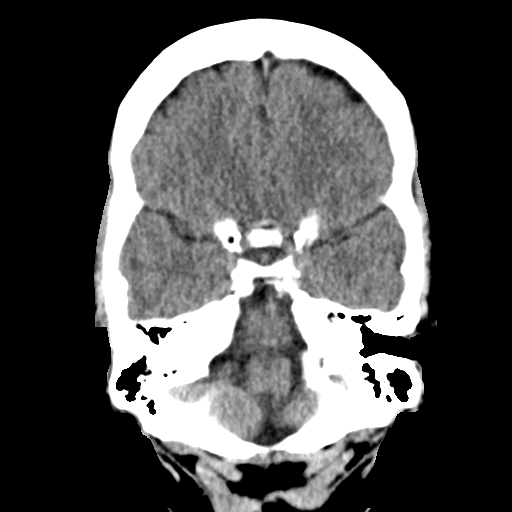
[im 11/31  brain]
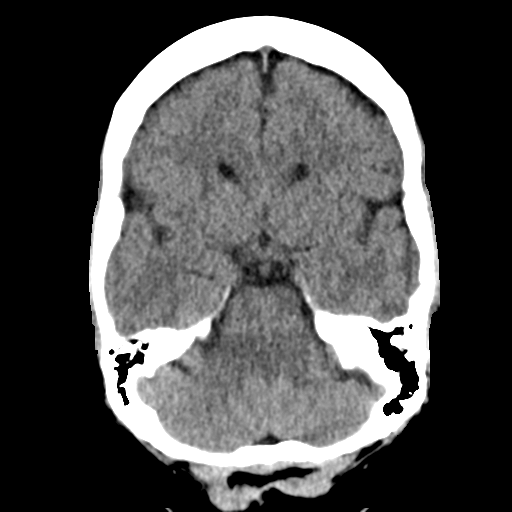
[im 14/31  brain]
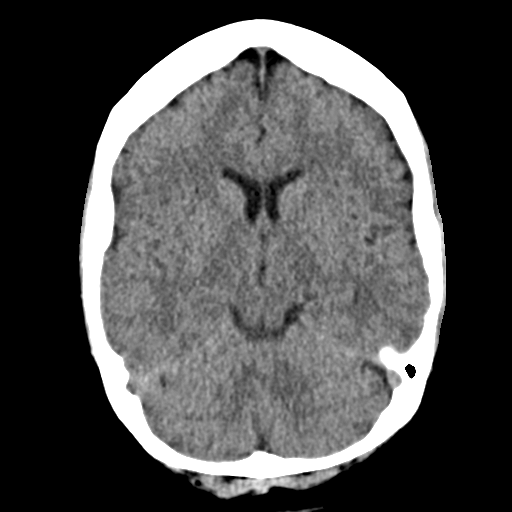
[im 14/31  bone]
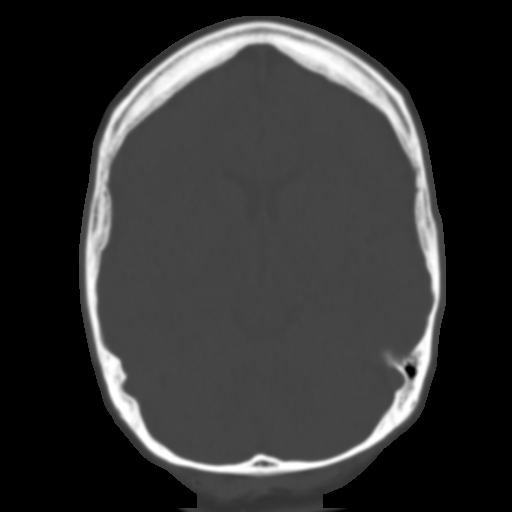
[im 17/31  brain]
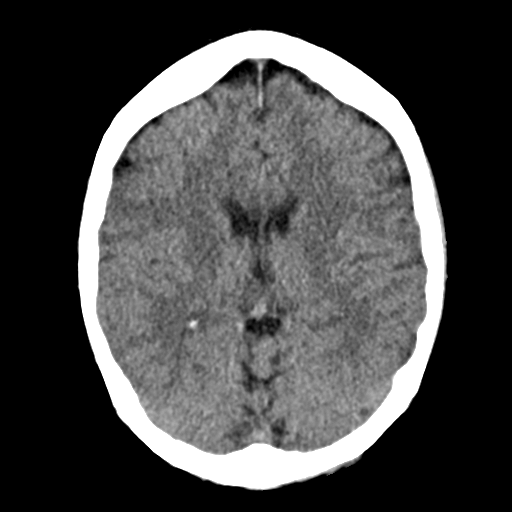
[im 20/31  brain]
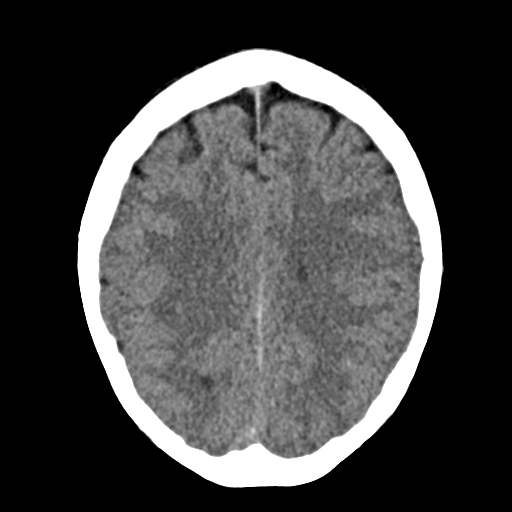
[im 23/31  brain]
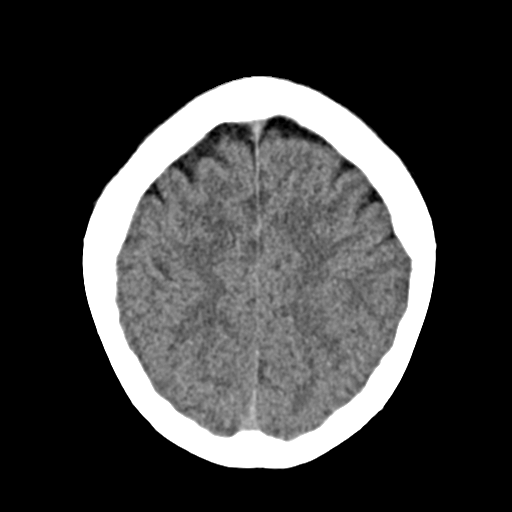
[im 25/31  brain]
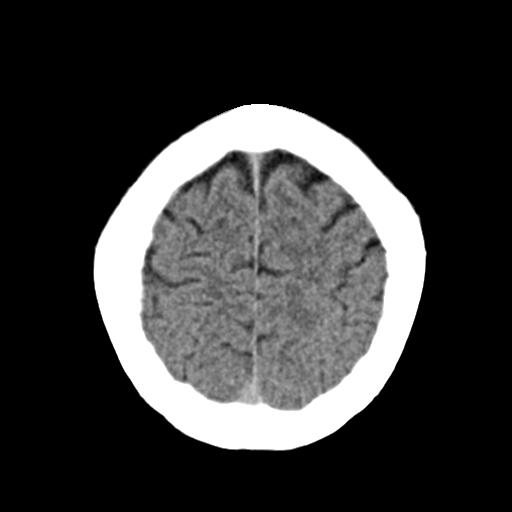
[im 25/31  bone]
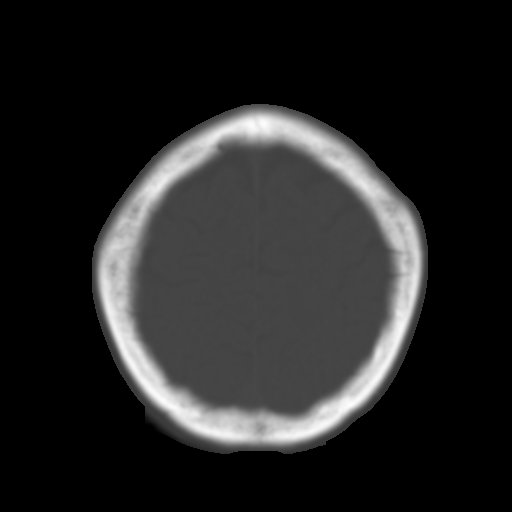
[im 28/31  brain]
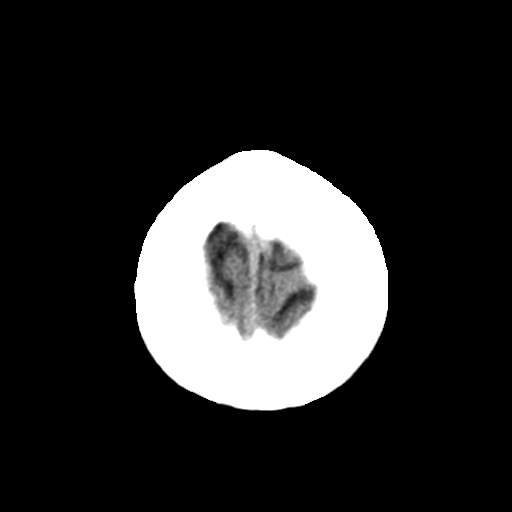

[Series 4: coronal soft · coronal · 0.30mm/px · 3 of 58 slices shown]
[im 20/58  brain]
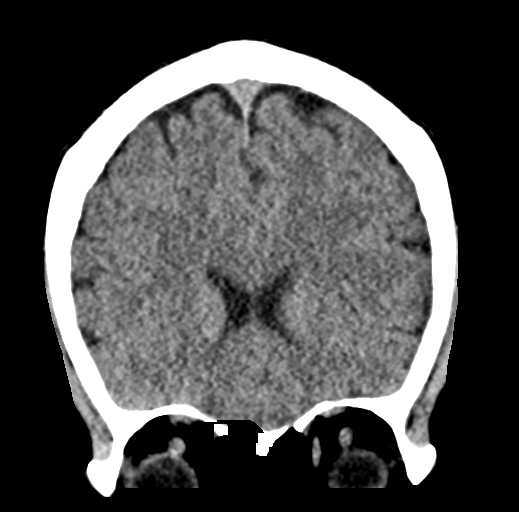
[im 26/58  brain]
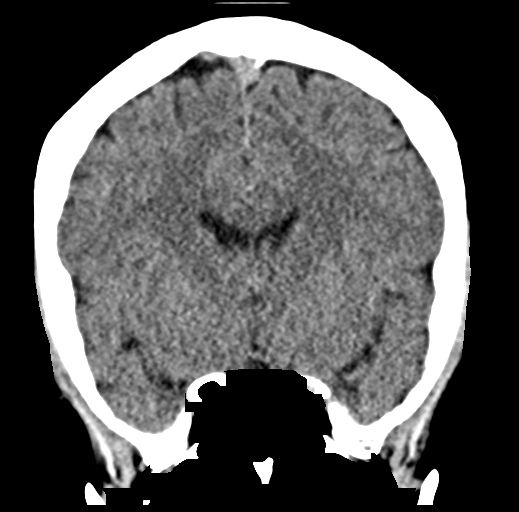
[im 32/58  brain]
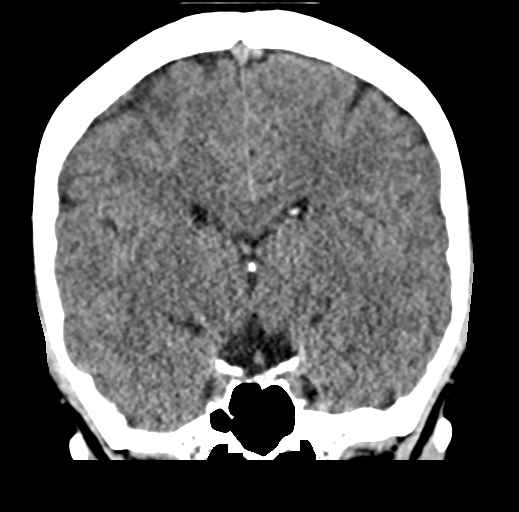

[Series 5: sag soft · sagittal · 0.30mm/px · 3 of 47 slices shown]
[im 16/47  brain]
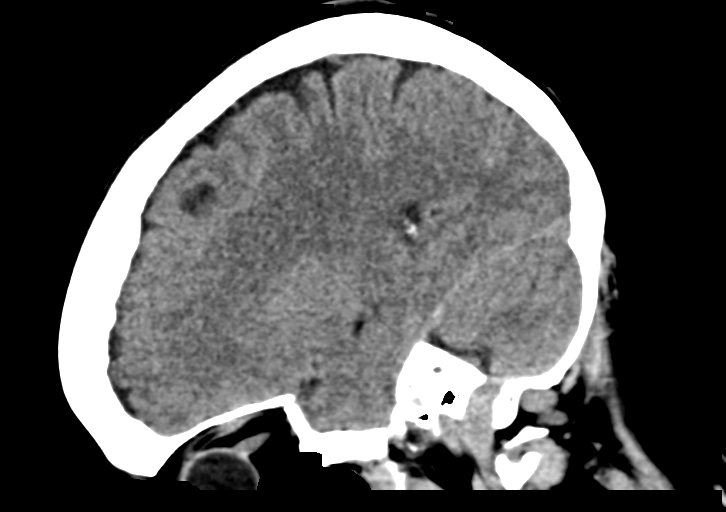
[im 24/47  brain]
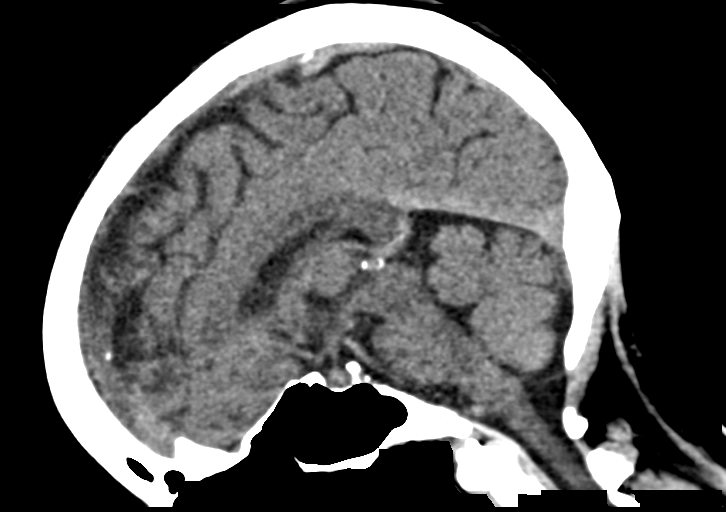
[im 31/47  brain]
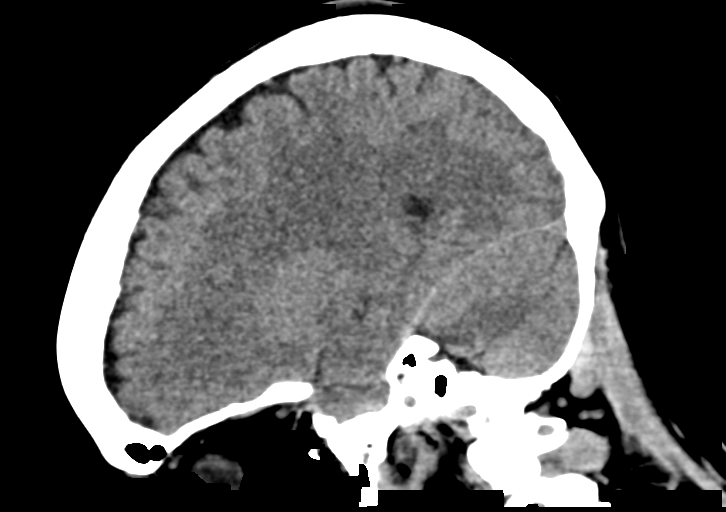

[16 of 47 positions shown; findings below may reference images not displayed]

FINDINGS: Brain: No evidence of acute infarction, hemorrhage, hydrocephalus,
extra-axial collection or mass lesion/mass effect.

Vascular: No hyperdense vessel or unexpected calcification.

Skull: Intact.  No focal lesion.

Sinuses/Orbits: Negative.

Other: None.
IMPRESSION: Normal head CT.

## 2021-05-18 IMAGING — MR MR HEAD WO/W CM
13 of 15 series · 40 of 48 positions shown · IV contrast (gadavist)
Comparison: Head CT 08/22/2019.  MRI 10/10/2018.

CLINICAL DATA: Left-sided facial paresthesia.  Left body tingling.

EXAM:
MRI HEAD WITHOUT AND WITH CONTRAST
TECHNIQUE: Multiplanar, multiecho pulse sequences of the brain and surrounding
structures were obtained without and with intravenous contrast.
CONTRAST:  5mL GADAVIST GADOBUTROL 1 MMOL/ML IV SOLN

[Series 5: DWI · axial · 3.0mm · 0.88mm/px · z∈[-99,+30]mm · 5 of 88 slices shown (1 of 4)]
[im 1/88]
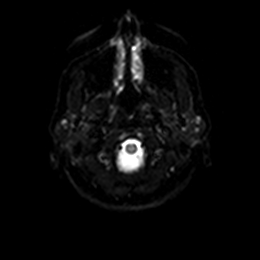
[im 22/88]
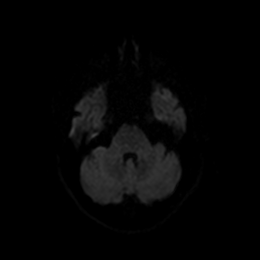
[im 44/88]
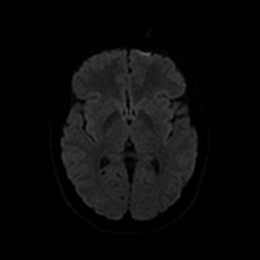
[im 66/88]
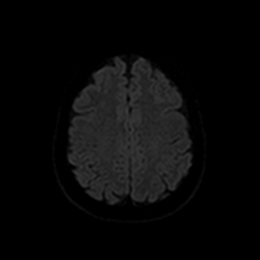
[im 88/88]
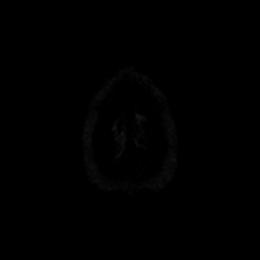

[Series 6: DWI · axial · 3.0mm · 0.88mm/px · z∈[-99,+30]mm · 3 of 44 slices shown (2 of 4)]
[im 1/44]
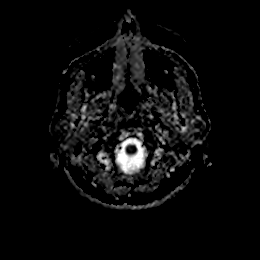
[im 22/44]
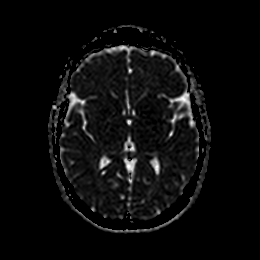
[im 44/44]
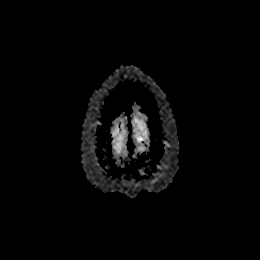

[Series 7: DWI · coronal · 4.0mm · 0.88mm/px · 4 of 64 slices shown (3 of 4)]
[im 1/64]
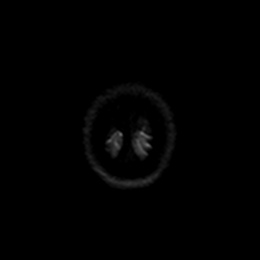
[im 22/64]
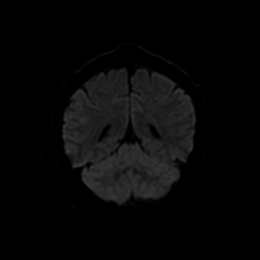
[im 43/64]
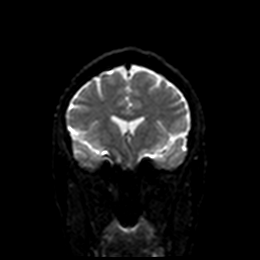
[im 64/64]
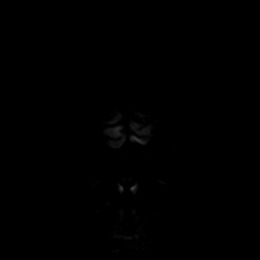

[Series 8: DWI · coronal · 4.0mm · 0.88mm/px · 2 of 32 slices shown (4 of 4)]
[im 1/32]
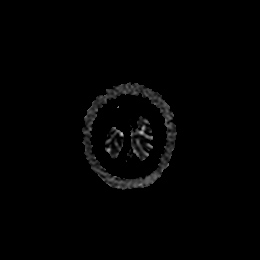
[im 32/32]
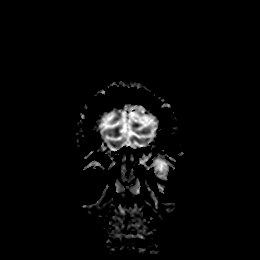

[Series 9: T1 · sagittal · 5.0mm · 0.75mm/px · 2 of 23 slices shown]
[im 1/23]
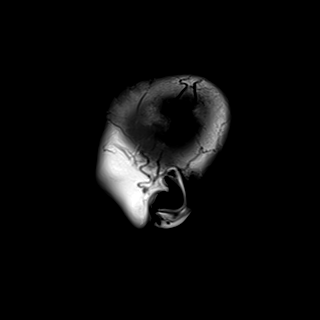
[im 23/23]
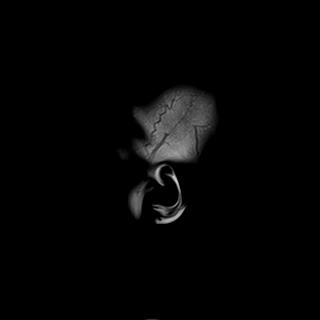

[Series 10: T2 · axial · 5.0mm · 0.72mm/px · z∈[-110,+34]mm · 2 of 25 slices shown]
[im 1/25]
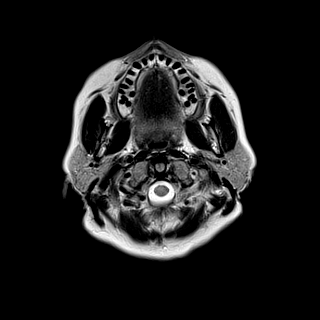
[im 25/25]
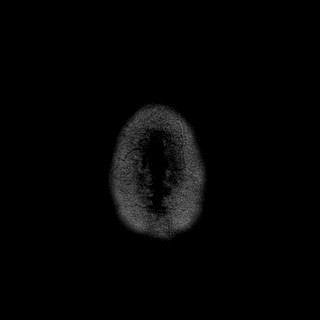

[Series 11: FLAIR · axial · 5.0mm · 0.45mm/px · z∈[-109,+35]mm · 2 of 25 slices shown]
[im 1/25]
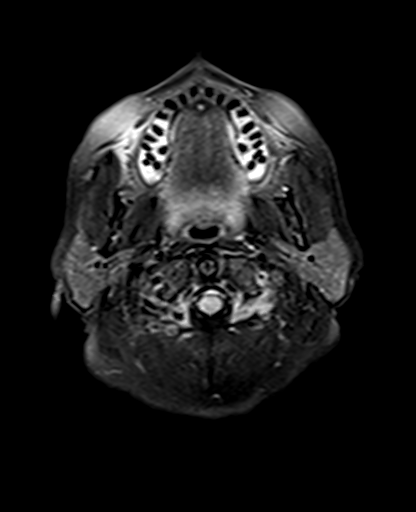
[im 25/25]
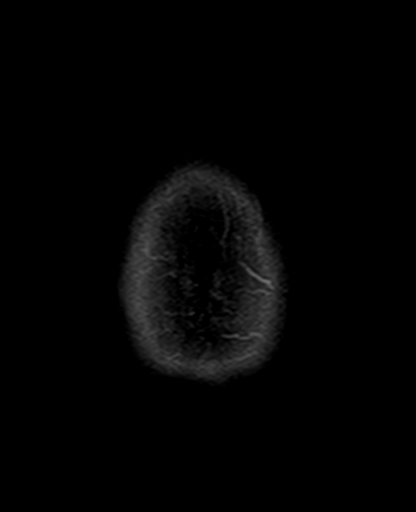

[Series 12: mag_images · axial · 3.0mm · 0.90mm/px · z∈[-120,+57]mm · 4 of 60 slices shown]
[im 1/60]
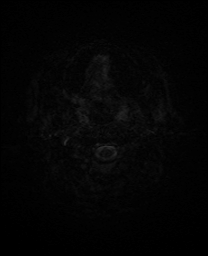
[im 20/60]
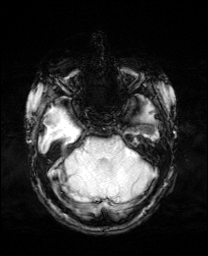
[im 40/60]
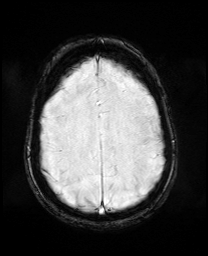
[im 60/60]
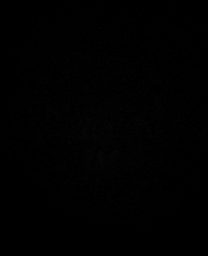

[Series 13: pha_images · axial · 3.0mm · 0.90mm/px · z∈[-120,+57]mm · 4 of 54 slices shown]
[im 1/54]
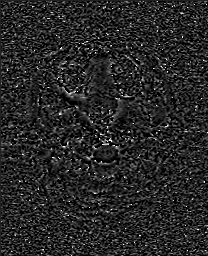
[im 18/54]
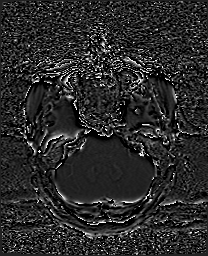
[im 36/54]
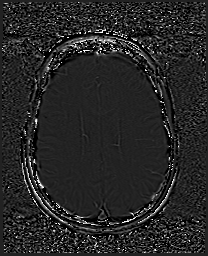
[im 54/54]
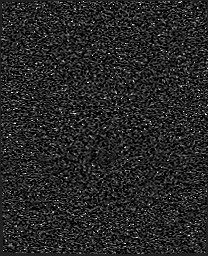

[Series 14: swi_images · axial · 3.0mm · 0.90mm/px · z∈[-120,+57]mm · 4 of 60 slices shown]
[im 1/60]
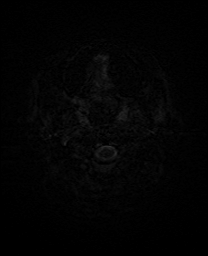
[im 20/60]
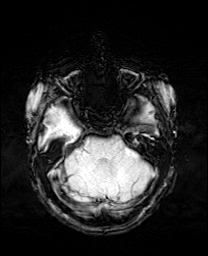
[im 40/60]
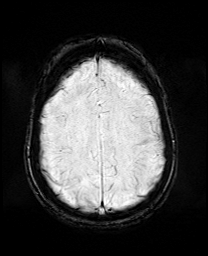
[im 60/60]
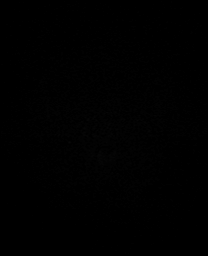

[Series 15: mip_images(sw) · axial · 24.0mm · 0.90mm/px · z∈[-110,+46]mm · 4 of 53 slices shown]
[im 1/53]
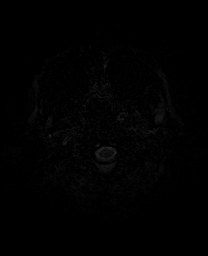
[im 18/53]
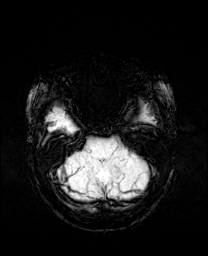
[im 35/53]
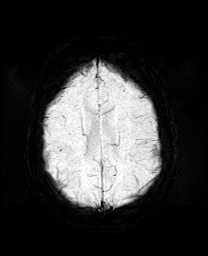
[im 53/53]
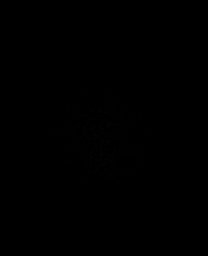

[Series 17: T2 post-contrast · coronal · 5.0mm · 0.72mm/px · 2 of 28 slices shown]
[im 1/28]
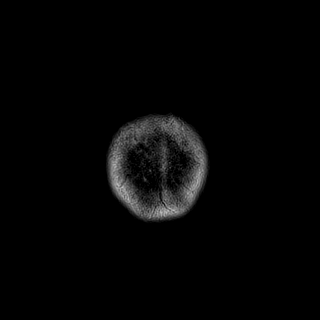
[im 28/28]
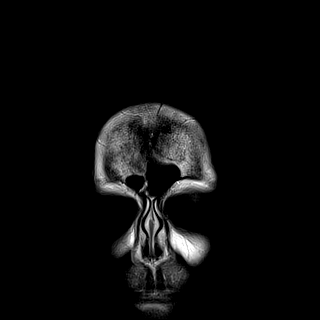

[Series 19: T1 post-contrast · coronal · 5.0mm · 0.34mm/px · 2 of 28 slices shown]
[im 1/28]
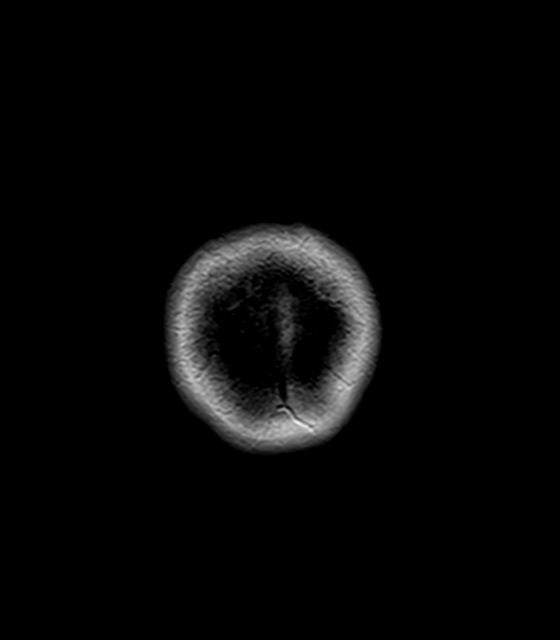
[im 28/28]
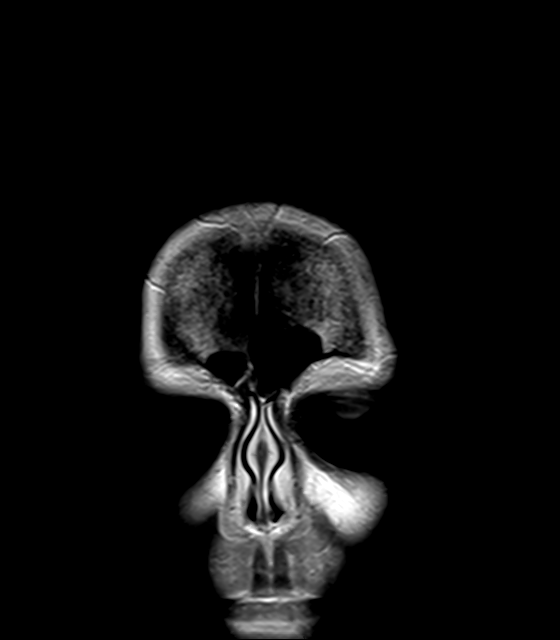

[40 of 48 positions shown; findings below may reference images not displayed]

FINDINGS: Brain: The brain has a normal appearance without evidence of
malformation, atrophy, old or acute small or large vessel
infarction, mass lesion, hemorrhage, hydrocephalus or extra-axial
collection.

Vascular: Major vessels at the base of the brain show flow. Venous
sinuses appear patent.

Skull and upper cervical spine: Normal.

Sinuses/Orbits: Clear/normal.

Other: None significant.
IMPRESSION: Normal examination.

## 2021-05-18 MED FILL — Potassium Chloride Microencapsulated Crys ER Tab 20 mEq: ORAL | 90 days supply | Qty: 180 | Fill #1 | Status: AC

## 2021-05-19 ENCOUNTER — Other Ambulatory Visit (HOSPITAL_BASED_OUTPATIENT_CLINIC_OR_DEPARTMENT_OTHER): Payer: Self-pay

## 2021-05-26 ENCOUNTER — Other Ambulatory Visit (HOSPITAL_BASED_OUTPATIENT_CLINIC_OR_DEPARTMENT_OTHER): Payer: Self-pay

## 2021-05-26 MED FILL — Famotidine Tab 20 MG: ORAL | 30 days supply | Qty: 60 | Fill #0 | Status: AC

## 2021-06-23 ENCOUNTER — Other Ambulatory Visit (HOSPITAL_BASED_OUTPATIENT_CLINIC_OR_DEPARTMENT_OTHER): Payer: Self-pay

## 2021-06-30 DIAGNOSIS — R52 Pain, unspecified: Secondary | ICD-10-CM | POA: Diagnosis not present

## 2021-06-30 DIAGNOSIS — M25672 Stiffness of left ankle, not elsewhere classified: Secondary | ICD-10-CM | POA: Diagnosis not present

## 2021-06-30 DIAGNOSIS — R0981 Nasal congestion: Secondary | ICD-10-CM | POA: Diagnosis not present

## 2021-07-29 DIAGNOSIS — Z1159 Encounter for screening for other viral diseases: Secondary | ICD-10-CM | POA: Diagnosis not present

## 2021-07-29 DIAGNOSIS — K219 Gastro-esophageal reflux disease without esophagitis: Secondary | ICD-10-CM | POA: Diagnosis not present

## 2021-07-29 DIAGNOSIS — I10 Essential (primary) hypertension: Secondary | ICD-10-CM | POA: Diagnosis not present

## 2021-07-29 DIAGNOSIS — Z0000 Encounter for general adult medical examination without abnormal findings: Secondary | ICD-10-CM | POA: Diagnosis not present

## 2021-07-29 DIAGNOSIS — K588 Other irritable bowel syndrome: Secondary | ICD-10-CM | POA: Diagnosis not present

## 2021-07-29 DIAGNOSIS — H918X1 Other specified hearing loss, right ear: Secondary | ICD-10-CM | POA: Diagnosis not present

## 2021-07-31 ENCOUNTER — Other Ambulatory Visit (HOSPITAL_BASED_OUTPATIENT_CLINIC_OR_DEPARTMENT_OTHER): Payer: Self-pay

## 2021-07-31 MED ORDER — CARESTART COVID-19 HOME TEST VI KIT
PACK | 0 refills | Status: DC
  Filled 2021-07-31: qty 4, 4d supply, fill #0

## 2021-08-20 ENCOUNTER — Other Ambulatory Visit (HOSPITAL_BASED_OUTPATIENT_CLINIC_OR_DEPARTMENT_OTHER): Payer: Self-pay

## 2021-08-20 MED ORDER — INFLUENZA VAC SPLIT QUAD 0.5 ML IM SUSY
PREFILLED_SYRINGE | INTRAMUSCULAR | 0 refills | Status: DC
  Filled 2021-08-20: qty 0.5, 1d supply, fill #0

## 2021-08-27 ENCOUNTER — Other Ambulatory Visit (HOSPITAL_BASED_OUTPATIENT_CLINIC_OR_DEPARTMENT_OTHER): Payer: Self-pay

## 2021-08-27 MED ORDER — ROSUVASTATIN CALCIUM 5 MG PO TABS
ORAL_TABLET | ORAL | 3 refills | Status: DC
  Filled 2021-08-27: qty 90, 90d supply, fill #0

## 2021-08-29 DIAGNOSIS — M542 Cervicalgia: Secondary | ICD-10-CM | POA: Diagnosis not present

## 2021-08-29 DIAGNOSIS — M47812 Spondylosis without myelopathy or radiculopathy, cervical region: Secondary | ICD-10-CM | POA: Diagnosis not present

## 2021-08-31 ENCOUNTER — Other Ambulatory Visit (HOSPITAL_BASED_OUTPATIENT_CLINIC_OR_DEPARTMENT_OTHER): Payer: Self-pay

## 2021-09-01 ENCOUNTER — Other Ambulatory Visit (HOSPITAL_BASED_OUTPATIENT_CLINIC_OR_DEPARTMENT_OTHER): Payer: Self-pay

## 2021-09-01 MED ORDER — POTASSIUM CHLORIDE ER 20 MEQ PO TBCR
EXTENDED_RELEASE_TABLET | ORAL | 2 refills | Status: DC
  Filled 2021-09-01: qty 180, 90d supply, fill #0
  Filled 2021-12-03 – 2022-05-25 (×3): qty 180, 90d supply, fill #1

## 2021-09-02 ENCOUNTER — Other Ambulatory Visit (HOSPITAL_BASED_OUTPATIENT_CLINIC_OR_DEPARTMENT_OTHER): Payer: Self-pay

## 2021-09-03 ENCOUNTER — Other Ambulatory Visit (HOSPITAL_BASED_OUTPATIENT_CLINIC_OR_DEPARTMENT_OTHER): Payer: Self-pay

## 2021-09-10 DIAGNOSIS — R002 Palpitations: Secondary | ICD-10-CM | POA: Diagnosis not present

## 2021-09-10 DIAGNOSIS — R42 Dizziness and giddiness: Secondary | ICD-10-CM | POA: Diagnosis not present

## 2021-09-16 ENCOUNTER — Other Ambulatory Visit (HOSPITAL_BASED_OUTPATIENT_CLINIC_OR_DEPARTMENT_OTHER): Payer: Self-pay

## 2021-09-16 MED ORDER — CARESTART COVID-19 HOME TEST VI KIT
PACK | 0 refills | Status: DC
  Filled 2021-09-16: qty 2, 4d supply, fill #0

## 2021-09-19 ENCOUNTER — Other Ambulatory Visit (HOSPITAL_BASED_OUTPATIENT_CLINIC_OR_DEPARTMENT_OTHER): Payer: Self-pay

## 2021-09-19 MED ORDER — ZOSTER VAC RECOMB ADJUVANTED 50 MCG/0.5ML IM SUSR
INTRAMUSCULAR | 1 refills | Status: DC
Start: 2021-09-19 — End: 2022-08-09
  Filled 2021-09-19: qty 1, 1d supply, fill #0
  Filled 2021-12-05: qty 1, 1d supply, fill #1

## 2021-09-20 ENCOUNTER — Other Ambulatory Visit (HOSPITAL_BASED_OUTPATIENT_CLINIC_OR_DEPARTMENT_OTHER): Payer: Self-pay

## 2021-09-22 ENCOUNTER — Other Ambulatory Visit (HOSPITAL_BASED_OUTPATIENT_CLINIC_OR_DEPARTMENT_OTHER): Payer: Self-pay

## 2021-09-22 MED ORDER — LOSARTAN POTASSIUM-HCTZ 50-12.5 MG PO TABS
1.0000 | ORAL_TABLET | Freq: Every day | ORAL | 2 refills | Status: DC
  Filled 2021-09-22: qty 90, 90d supply, fill #0
  Filled 2021-12-25: qty 90, 90d supply, fill #1
  Filled 2022-03-22: qty 90, 90d supply, fill #2

## 2021-09-22 MED FILL — Famotidine Tab 20 MG: ORAL | 30 days supply | Qty: 60 | Fill #1 | Status: AC

## 2021-10-06 ENCOUNTER — Other Ambulatory Visit (HOSPITAL_BASED_OUTPATIENT_CLINIC_OR_DEPARTMENT_OTHER): Payer: Self-pay

## 2021-10-06 MED ORDER — CARESTART COVID-19 HOME TEST VI KIT
PACK | 0 refills | Status: DC
  Filled 2021-10-06: qty 4, 4d supply, fill #0

## 2021-10-10 NOTE — Progress Notes (Signed)
HPI: FU dyspnea and chest pain. Echocardiogram in August of 2010 revealed normal LV function and no valvular abnormalities. Stress echocardiogram December 2010 normal. CardioNet 2010 showed sinus to sinus tachycardia with rare PAC. Stress echo 6/15 normal. ETT 11/19 negative adequate.  Cardiac CTA February 2022 showed minimal nonobstructive coronary disease and calcium score of 0.7.  Recent LDL 110 September 2022.  Since last seen she denies dyspnea or syncope.  She describes a fluttering at times.  She has pain in her left and right scapula that is not related to exertion.  She denies exertional chest pain.  Current Outpatient Medications  Medication Sig Dispense Refill   Calcium Carbonate-Vit D-Min (CALCIUM 1200 PO) Take 1,200 Units by mouth daily.     cholecalciferol (VITAMIN D) 1000 UNITS tablet Take 1,200 Units by mouth daily.     clindamycin (CLINDAGEL) 1 % gel      COVID-19 At Home Antigen Test (CARESTART COVID-19 HOME TEST) KIT Use as directed within package instructions 4 each 0   Crisaborole (EUCRISA) 2 % OINT Apply 1 application topically 2 (two) times daily. 60 g 1   diazepam (VALIUM) 5 MG tablet Take 1 tablet (5 mg total) by mouth every 8 (eight) hours as needed (vertigo). 8 tablet 0   famotidine (PEPCID) 20 MG tablet TAKE 1 TABLET BY MOUTH TWICE DAILY 60 tablet 11   hydrocortisone 2.5 % ointment APPLY TWICE A DAY FOR 2 WEEKS 28.35 g 0   influenza vac split quadrivalent PF (FLUARIX) 0.5 ML injection Inject into the muscle. 0.5 mL 0   losartan-hydrochlorothiazide (HYZAAR) 50-12.5 MG tablet TAKE 1 TABLET BY MOUTH ONCE DAILY 90 tablet 2   meclizine (ANTIVERT) 25 MG tablet Take 25 mg by mouth 2 (two) times daily as needed for dizziness.     melatonin 3 MG TABS tablet Take 3 mg by mouth at bedtime as needed.     methocarbamol (ROBAXIN) 500 MG tablet Take 500 mg by mouth every 6 (six) hours as needed.     metroNIDAZOLE (METROCREAM) 0.75 % cream      ondansetron (ZOFRAN ODT) 4 MG  disintegrating tablet Take 1 tablet (4 mg total) by mouth every 8 (eight) hours as needed for nausea or vomiting. 20 tablet 0   OSCIMIN 0.125 MG SUBL PLACE 1 TABLET (0.125 MG TOTAL) UNDER THE TONGUE EVERY 4 (FOUR) HOURS AS NEEDED. 60 tablet 0   Potassium Chloride ER 20 MEQ TBCR Take 1 tablet by mouth 2 times daily with food 180 tablet 2   rosuvastatin (CRESTOR) 5 MG tablet Take 1 tablet by mouth once a day 90 tablet 3   vitamin B-12 (CYANOCOBALAMIN) 500 MCG tablet Take 500 mcg by mouth daily.     Zinc 50 MG TABS      Zoster Vaccine Adjuvanted [facility]) injection Inject into the muscle. 1 each 1   COVID-19 At Home Antigen Test (CARESTART COVID-19 HOME TEST) KIT Use as directed per package instructions 4 each 0   losartan-hydrochlorothiazide (HYZAAR) 50-12.5 MG tablet TAKE 1 TABLET BY MOUTH DAILY. 90 tablet 0   losartan-hydrochlorothiazide (HYZAAR) 50-12.5 MG tablet TAKE 1 TABLET BY MOUTH ONCE DAILY 90 tablet 1   Magnesium 200 MG TABS      metoprolol tartrate (LOPRESSOR) 100 MG tablet TAKE 1 TABLET BY MOUTH 2 HOURS PRIOR TO CT (Patient not taking: Reported on 01/31/2021) 1 tablet 0   rosuvastatin (CRESTOR) 20 MG tablet TAKE 1 TABLET BY MOUTH ONCE DAILY (Patient not taking: Reported on  01/31/2021) 90 tablet 3   Current Facility-Administered Medications  Medication Dose Route Frequency Provider Last Rate Last Admin   0.9 %  sodium chloride infusion  500 mL Intravenous Once Gatha Mayer, MD         Past Medical History:  Diagnosis Date   Allergy    Buspirone, Sulfa, Pantoprazole   Benign fundic gland polyps of stomach 02/12/2016   Chicken pox    Eczema    Functional dyspepsia    GERD (gastroesophageal reflux disease)    H/O adenomatous polyp of colon 10/04/2014   09/2014 - 3 mm adenoma - repeat colonoscopy 2022   Hyperlipidemia    no meds   Hypertension    pt denies   IBS (irritable bowel syndrome)    Insomnia    MEDIAL EPICONDYLITIS, RIGHT 07/17/2010   Nonulcer dyspepsia 02/12/2016    Other abnormal glucose    Tiny hypodensity left hepatic lobe-CT of abdomen and pelvis 04/21/3558-RCBUL 39m periumblical hernia   PAC (premature atrial contraction)    Palpitations     Past Surgical History:  Procedure Laterality Date   CESAREAN SECTION  01/2001   COLONOSCOPY     DE QUERVAIN'S RELEASE  2000   Right wrist area   ESOPHAGOGASTRODUODENOSCOPY      Social History   Socioeconomic History   Marital status: Married    Spouse name: Not on file   Number of children: 2   Years of education: Not on file   Highest education level: Not on file  Occupational History   Occupation: Xray/CT TEngineer, production [place]  Tobacco Use   Smoking status: Never   Smokeless tobacco: Never  Vaping Use   Vaping Use: Never used  Substance and Sexual Activity   Alcohol use: Yes    Alcohol/week: 1.0 standard drink    Types: 1 Glasses of wine per week    Comment: occasional   Drug use: No   Sexual activity: Yes    Birth control/protection: Inserts  Other Topics Concern   Not on file  Social History Narrative   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    Alcohol Use - yes-wine         Full Time    Married           Social Determinants of Health   Financial Resource Strain: Not on file  Food Insecurity: Not on file  Transportation Needs: Not on file  Physical Activity: Not on file  Stress: Not on file  Social Connections: Not on file  Intimate Partner Violence: Not on file    Family History  Problem Relation Age of Onset   Hypertension Mother    Hyperlipidemia Mother    Fibrocystic breast disease Mother    Hearing loss Mother    Osteopenia Mother    Prostate cancer Father [id number]      deceased   Hypertension Father    Diabetes Father    Kidney disease Father    Heart disease Father        MI   Hyperlipidemia Father    Other Brother        unknown   Thyroid disease Sister    Diabetes Sister        pre-diabetes   Hypertension Sister     Arthritis Sister    Diabetes Sister    Hypertension Sister    Hyperlipidemia Sister    Fibrocystic breast disease Sister  Colon cancer Maternal Uncle        died in late 74's   Diabetes Maternal Grandmother    Prostate cancer Maternal Grandfather    COPD Maternal Grandfather    Hyperlipidemia Sister    Hypertension Sister    Rectal cancer Neg Hx    Stomach cancer Neg Hx    Esophageal cancer Neg Hx    Pancreatic cancer Neg Hx     ROS: no fevers or chills, productive cough, hemoptysis, dysphasia, odynophagia, melena, hematochezia, dysuria, hematuria, rash, seizure activity, orthopnea, PND, pedal edema, claudication. Remaining systems are negative.  Physical Exam: Well-developed well-nourished in no acute distress.  Skin is warm and dry.  HEENT is normal.  Neck is supple.  Chest is clear to auscultation with normal expansion.  Cardiovascular exam is regular rate and rhythm.  Abdominal exam nontender or distended. No masses palpated. Extremities show no edema. neuro grossly intact  ECG-normal sinus rhythm at a rate of 83, no ST changes.  Personally reviewed  A/P  1 chest pain-symptoms remain atypical.  She complains of scapular pain on the left and right unrelated to activities.  Electrocardiogram shows no new ST changes.  Previous CTA demonstrated minimal coronary disease.  Will not pursue further ischemia evaluation.  2 hyperlipidemia-she has minimal plaque.  Increase Crestor to 20 mg daily.  Check lipids and liver in 8 weeks.  3 hypertension-patient's blood pressure is controlled.  Continue present medications and follow.  4 palpitations-she complains of palpitations.  We will arrange a 7-day Zio patch to further assess.  We will add Toprol 12.5 mg daily.  She had difficulties tolerating this in the past.  I have asked her to take it in the evening.  Kirk Ruths, MD

## 2021-10-17 ENCOUNTER — Other Ambulatory Visit: Payer: Self-pay

## 2021-10-17 ENCOUNTER — Other Ambulatory Visit (HOSPITAL_BASED_OUTPATIENT_CLINIC_OR_DEPARTMENT_OTHER): Payer: Self-pay

## 2021-10-17 ENCOUNTER — Ambulatory Visit (INDEPENDENT_AMBULATORY_CARE_PROVIDER_SITE_OTHER): Payer: 59 | Admitting: Cardiology

## 2021-10-17 ENCOUNTER — Ambulatory Visit (INDEPENDENT_AMBULATORY_CARE_PROVIDER_SITE_OTHER): Payer: 59

## 2021-10-17 ENCOUNTER — Encounter: Payer: Self-pay | Admitting: Cardiology

## 2021-10-17 VITALS — BP 124/76 | HR 86 | Ht 61.0 in | Wt 136.0 lb

## 2021-10-17 DIAGNOSIS — R002 Palpitations: Secondary | ICD-10-CM

## 2021-10-17 DIAGNOSIS — R931 Abnormal findings on diagnostic imaging of heart and coronary circulation: Secondary | ICD-10-CM

## 2021-10-17 DIAGNOSIS — I10 Essential (primary) hypertension: Secondary | ICD-10-CM | POA: Diagnosis not present

## 2021-10-17 DIAGNOSIS — R072 Precordial pain: Secondary | ICD-10-CM | POA: Diagnosis not present

## 2021-10-17 MED ORDER — ROSUVASTATIN CALCIUM 20 MG PO TABS
20.0000 mg | ORAL_TABLET | Freq: Every day | ORAL | 3 refills | Status: DC
  Filled 2021-10-17: qty 90, 90d supply, fill #0

## 2021-10-17 MED ORDER — METOPROLOL SUCCINATE ER 25 MG PO TB24
12.5000 mg | ORAL_TABLET | Freq: Every day | ORAL | 3 refills | Status: DC
Start: 2021-10-17 — End: 2022-10-10
  Filled 2021-10-17: qty 45, 90d supply, fill #0
  Filled 2022-01-25: qty 45, 90d supply, fill #1
  Filled 2022-05-02: qty 45, 90d supply, fill #2
  Filled 2022-07-19: qty 45, 90d supply, fill #3

## 2021-10-17 NOTE — Progress Notes (Unsigned)
Enrolled patient for a 7 day Zio XT Monitor to be mailed to patients home.  

## 2021-10-17 NOTE — Patient Instructions (Signed)
Medication Instructions:   INCREASE ROSUVASTATIN TO 20 MG ONCE DAILY=4 OF THE 5 MG TABLETS ONCE DAILY  *If you need a refill on your cardiac medications before your next appointment, please call your pharmacy*   Lab Work:  Your physician recommends that you return for lab work in: [place]  If you have labs (blood work) drawn today and your tests are completely normal, you will receive your results only by: [place] (if you have MyChart) OR A paper copy in the mail If you have any lab test that is abnormal or we need to change your treatment, we will call you to review the results.   Testing/Procedures:  [facility]- Long Term Monitor Instructions  Your physician has requested you wear a ZIO patch monitor for 7 days.  This is a single patch monitor. [employer] supplies one patch monitor per enrollment. Additional stickers are not available. Please do not apply patch if you will be having a Nuclear Stress Test,  Echocardiogram, Cardiac CT, MRI, or Chest Xray during the period you would be wearing the  monitor. The patch cannot be worn during these tests. You cannot remove and re-apply the  ZIO XT patch monitor.  Your ZIO patch monitor will be mailed 3 day USPS to your address on file. It may take 3-5 days  to receive your monitor after you have been enrolled.  Once you have received your monitor, please review the enclosed instructions. Your monitor  has already been registered assigning a specific monitor serial # to you.  Billing and Patient Assistance Program Information  We have supplied [employer] with any of your insurance information on file for billing purposes. [employer] offers a sliding scale Patient Assistance Program for patients that do not have  insurance, or whose insurance does not completely cover the cost of the ZIO monitor.  You must apply for the Patient Assistance Program to qualify for this discounted rate.  To apply, please call [employer] at [phone],  select option 4, select option 2, ask to apply for  Patient Assistance Program. Theodore Demark will ask your household income, and how many people  are in your household. They will quote your out-of-pocket cost based on that information.  [employer] will also be able to set up a 54-month, interest-free payment plan if needed.  Applying the monitor   Shave hair from upper left chest.  Hold abrader disc by orange tab. Rub abrader in 40 strokes over the upper left chest as  indicated in your monitor instructions.  Clean area with 4 enclosed alcohol pads. Let dry.  Apply patch as indicated in monitor instructions. Patch will be placed under collarbone on left  side of chest with arrow pointing upward.  Rub patch adhesive wings for 2 minutes. Remove white label marked "1". Remove the white  label marked "2". Rub patch adhesive wings for 2 additional minutes.  While looking in a mirror, press and release button in center of patch. A small green light will  flash 3-4 times. This will be your only indicator that the monitor has been turned on.  Do not shower for the first 24 hours. You may shower after the first 24 hours.  Press the button if you feel a symptom. You will hear a small click. Record Date, Time and  Symptom in the Patient Logbook.  When you are ready to remove the patch, follow instructions on the last 2 pages of Patient  Logbook. Stick patch monitor onto the last page of Patient  Logbook.  Place Patient Logbook in the blue and white box. Use locking tab on box and tape box closed  securely. The blue and white box has prepaid postage on it. Please place it in the mailbox as  soon as possible. Your physician should have your test results approximately 7 days after the  monitor has been mailed back to [facility].  Call [place] at [phone] if you have questions regarding  your ZIO XT patch monitor. Call them immediately if you see an orange light blinking on your   monitor.  If your monitor falls off in less than 4 days, contact our Monitor department at [phone].  If your monitor becomes loose or falls off after 4 days call [employer] at [phone] for  suggestions on securing your monitor    Follow-Up: At [facility], you and your health needs are our priority.  As part of our continuing mission to provide you with exceptional heart care, we have created designated Provider Care Teams.  These Care Teams include your primary Cardiologist (physician) and Advanced Practice Providers (APPs -  Physician Assistants and Nurse Practitioners) who all work together to provide you with the care you need, when you need it.  We recommend signing up for the patient portal called "MyChart".  Sign up information is provided on this After Visit Summary.  MyChart is used to connect with patients for Virtual Visits (Telemedicine).  Patients are able to view lab/test results, encounter notes, upcoming appointments, etc.  Non-urgent messages can be sent to your provider as well.   To learn more about what you can do with MyChart, go to [email or web address].    Your next appointment:   6 month(s)  The format for your next appointment:   In Person  Provider:   Kirk Ruths MD

## 2021-10-20 DIAGNOSIS — R002 Palpitations: Secondary | ICD-10-CM

## 2021-10-23 DIAGNOSIS — L905 Scar conditions and fibrosis of skin: Secondary | ICD-10-CM | POA: Diagnosis not present

## 2021-10-28 ENCOUNTER — Other Ambulatory Visit (HOSPITAL_BASED_OUTPATIENT_CLINIC_OR_DEPARTMENT_OTHER): Payer: Self-pay | Admitting: Family Medicine

## 2021-10-28 DIAGNOSIS — Z1231 Encounter for screening mammogram for malignant neoplasm of breast: Secondary | ICD-10-CM

## 2021-10-31 ENCOUNTER — Encounter: Payer: Self-pay | Admitting: Cardiology

## 2021-10-31 DIAGNOSIS — R002 Palpitations: Secondary | ICD-10-CM | POA: Diagnosis not present

## 2021-11-10 ENCOUNTER — Encounter: Payer: Self-pay | Admitting: *Deleted

## 2021-11-12 ENCOUNTER — Encounter: Payer: Self-pay | Admitting: *Deleted

## 2021-11-17 ENCOUNTER — Ambulatory Visit (INDEPENDENT_AMBULATORY_CARE_PROVIDER_SITE_OTHER): Payer: 59 | Admitting: Neurology

## 2021-11-17 ENCOUNTER — Other Ambulatory Visit (HOSPITAL_BASED_OUTPATIENT_CLINIC_OR_DEPARTMENT_OTHER): Payer: Self-pay

## 2021-11-17 ENCOUNTER — Encounter: Payer: Self-pay | Admitting: Neurology

## 2021-11-17 VITALS — BP 121/76 | HR 83 | Ht 61.0 in | Wt 140.0 lb

## 2021-11-17 DIAGNOSIS — R519 Headache, unspecified: Secondary | ICD-10-CM | POA: Diagnosis not present

## 2021-11-17 DIAGNOSIS — H9312 Tinnitus, left ear: Secondary | ICD-10-CM

## 2021-11-17 DIAGNOSIS — H5712 Ocular pain, left eye: Secondary | ICD-10-CM

## 2021-11-17 DIAGNOSIS — R4189 Other symptoms and signs involving cognitive functions and awareness: Secondary | ICD-10-CM

## 2021-11-17 DIAGNOSIS — H9191 Unspecified hearing loss, right ear: Secondary | ICD-10-CM | POA: Diagnosis not present

## 2021-11-17 DIAGNOSIS — H5462 Unqualified visual loss, left eye, normal vision right eye: Secondary | ICD-10-CM | POA: Diagnosis not present

## 2021-11-17 DIAGNOSIS — H905 Unspecified sensorineural hearing loss: Secondary | ICD-10-CM

## 2021-11-17 MED ORDER — CARESTART COVID-19 HOME TEST VI KIT
PACK | 0 refills | Status: DC
  Filled 2021-11-17: qty 2, 4d supply, fill #0

## 2021-11-17 NOTE — Patient Instructions (Addendum)
[email or web address]

## 2021-11-17 NOTE — Progress Notes (Addendum)
[facility]    Provider:  Dr Jaynee Eagles Requesting Provider: Kathyrn Lass MD Primary Care Provider:  Kathyrn Lass, MD  CC:  right  temporal hedache, left vision loss, pain, dizziness  Patient seen in the past, here for new issue. She had covid n June and felt "off' and dizziness, she feels she has cognitive issues since covid, random, happens sometimes forget maybe what to do next, she may forget some names. The name will eventually come to her. The dizziness is better but she still has it, no room spinning but something doesn't feel "normal in my head", she has ringing in the left ear, hearing loss right ear, no weakness, no numbness, she has some neck pain and had PT and sports medicine, cervicogenic dizziness or cervicalgia. She has left eye pressure and changes in vision left eye. Hgba1c 5.7 11/21/2020, TSH normal 11/21/2020. MRI Trigeminal nerve negative (05/29/2020)   HPI 05/22/20:  Nicole Crosby is a 56 y.o. female here as requested by Dr. Vertell Limber for cervicalgia.  She was evaluated in neurology by Dr. Thresa Ross in the past and she was last seen November 2020 there for vertigo and neck pain, reviewed notes: Patient initially seen for vertigo, feeling like she is on a boat, no associated headaches nausea vomiting, meclizine seemed to help, not positional in quality, 3-4 times a week in February 2020, she reported the time sensations of disequilibrium when walking around 5 years prior and had vestibular therapy, she also reported memory changes since a fall of the prior year, reported memory changes since 2015 with normal MRI of the brain in 2015.  She was found to have low B12 levels in 2020, she was on injections, B12 at that time was 402 per Dr. Posey Pronto, and paresthesias improved on that.  States that patient still has pain and hears a crack when she turns her head, dizziness with head movements is better, she also reported left-sided numbness and paresthesias, left side numbness in  the jaw, and numbness spread to the cheek and eye on 10/15, she went to urgent care and was felt to have inflammation of the cranial nerve and given a course of steroids, no improvement in symptoms, back to the ER when it involved her left arm and left leg and had an MRI of the brain with and without contrast with no acute changes seen and limb symptoms resolved when she last saw Dr. Delice Lesch but she continued to feel numbness in her face bilateral.  She did state at last appointment she continued to report a lot of neck pain, she tried tizanidine in the past, MRI of the cervical spine from 2013 reported mild cervical spondylosis and 1 central disc protrusion at C6-C7 without resulting stenosis.  She also reported memory loss to Dr. Delice Lesch, she had headaches in the past which stopped when she discontinued her reflux medication.  Patient is here alone complaining of left side of her face feels numb from the ear down the jawline and the eye. No shooting pain. Started in October, no inciting event, no trauma, she was at work and noticed it acutely, then it started going down the left arm and left leg then went to the ED. The left arm and leg resolved. She saw Dr. Delice Lesch and Dr. Amparo Bristol notes stated it was bilateral however patient does not recall this, The numbness waxes and wanes and by lunch time she can feel numbness in the left side of the face. Prednisone didn't help, she did  a dose pak and it didn't help.No other focal neurologic deficits, associated symptoms, inciting events or modifiable factors.  Reviewed notes, labs and imaging from outside physicians, which showed:  MRI cervical spine 09/2019: reviewed images and agree with following C2-3: Negative.   C3-4: Negative.   C4-5: Minimal disc bulging, stable to minimally increased. No stenosis.   C5-6: Slight enlargement of a small central disc protrusion without stenosis.   C6-7: Small central disc protrusion and new mild disc bulging result in  new mild right neural foraminal stenosis and borderline spinal stenosis.   C7-T1: Negative.   IMPRESSION: 1. Mildly progressive cervical spondylosis most notable at C6-7 where there is new mild right neural foraminal stenosis. 2. No significant spinal stenosis.  Review of Systems: Patient complains of symptoms per HPI as well as the following symptoms: eye pain . Pertinent negatives and positives per HPI. All others negative    Social History   Socioeconomic History   Marital status: Married    Spouse name: Not on file   Number of children: 2   Years of education: Not on file   Highest education level: Not on file  Occupational History   Occupation: Xray/CT Engineer, production: [employer]  Tobacco Use   Smoking status: Never   Smokeless tobacco: Never  Vaping Use   Vaping Use: Never used  Substance and Sexual Activity   Alcohol use: Yes    Alcohol/week: 1.0 standard drink    Types: 1 Glasses of wine per week    Comment: occasional   Drug use: No   Sexual activity: Yes    Birth control/protection: Inserts  Other Topics Concern   Not on file  Social History Narrative   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    Alcohol Use - yes-wine         Full Time    Married           Social Determinants of Health   Financial Resource Strain: Not on file  Food Insecurity: Not on file  Transportation Needs: Not on file  Physical Activity: Not on file  Stress: Not on file  Social Connections: Not on file  Intimate Partner Violence: Not on file    Family History  Problem Relation Age of Onset   Hypertension Mother    Hyperlipidemia Mother    Fibrocystic breast disease Mother    Hearing loss Mother    Osteopenia Mother    Dementia Mother    Prostate cancer Father 20       deceased   Hypertension Father    Diabetes Father    Kidney disease Father    Heart disease Father        MI   Hyperlipidemia Father    Thyroid disease Sister     Diabetes Sister        pre-diabetes   Hypertension Sister    Arthritis Sister    Diabetes Sister    Hypertension Sister    Hyperlipidemia Sister    Fibrocystic breast disease Sister    Hyperlipidemia Sister    Hypertension Sister    Other Brother        unknown   Colon cancer Maternal Uncle        died in late 62's   Diabetes Maternal Grandmother    Prostate cancer Maternal Grandfather    COPD Maternal Grandfather    Rectal cancer Neg Hx    Stomach cancer Neg  Hx    Esophageal cancer Neg Hx    Pancreatic cancer Neg Hx     Past Medical History:  Diagnosis Date   Allergy    Buspirone, Sulfa, Pantoprazole   Benign fundic gland polyps of stomach 02/12/2016   Chicken pox    Dizziness    Eczema    Functional dyspepsia    GERD (gastroesophageal reflux disease)    H/O adenomatous polyp of colon 10/04/2014   09/2014 - 3 mm adenoma - repeat colonoscopy 2022   Hyperlipidemia    no meds   Hypertension    pt denies   IBS (irritable bowel syndrome)    Insomnia    MEDIAL EPICONDYLITIS, RIGHT 07/17/2010   Nonulcer dyspepsia 02/12/2016   Other abnormal glucose    Tiny hypodensity left hepatic lobe-CT of abdomen and pelvis 12/14/9415-EYCXK 57m periumblical hernia   PAC (premature atrial contraction)    Palpitations     Patient Active Problem List   Diagnosis Date Noted   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   Nausea and vomiting 12/25/2019   Abdominal pain 12/25/2019   Constipation 12/25/2019   Change in bowel habit 12/25/2019   Hypokalemia 09/20/2019   Flexural eczema 05/17/2018   Eyelid dermatitis, allergic/contact 05/17/2018   Medial epicondylitis, right 03/17/2018   Ulnar nerve entrapment at elbow, 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   Vitamin D deficiency 11/03/2017   Shortened PR interval 09/27/2017   Greater trochanteric bursitis of left hip 05/12/2017   B12 deficiency 01/22/2017   Memory changes 08/28/2016   Nonulcer dyspepsia 02/12/2016   Benign fundic gland polyps of stomach  02/12/2016   Paresthesia 12/17/2015   Bladder spasm 12/13/2014   H/O adenomatous polyp of colon 10/04/2014   Screening for malignant neoplasm of cervix 05/31/2013   Physical exam 05/31/2013   Left L3 lumbar radiculitis 09/23/2012   Neck pain 08/22/2012   Adrenal nodule (H[place] 03/02/2012   Essential hypertension, benign 03/02/2012   IBS (irritable bowel syndrome)    PAC (premature atrial contraction)    Insomnia    GERD (gastroesophageal reflux disease)    DYSTHYMIA 09/30/2009   HEADACHE 09/21/2008   HEPATIC CYST 08/14/2008   Hyperlipidemia 08/06/2008    Past Surgical History:  Procedure Laterality Date   CESAREAN SECTION  01/2001   COLONOSCOPY     DE QUERVAIN'S RELEASE  2000   Right wrist area   ESOPHAGOGASTRODUODENOSCOPY      Current Outpatient Medications  Medication Sig Dispense Refill   Calcium Carbonate-Vit D-Min (CALCIUM 1200 PO) Take 1,200 Units by mouth daily.     cholecalciferol (VITAMIN D) 1000 UNITS tablet Take 1,200 Units by mouth daily.     clindamycin (CLINDAGEL) 1 % gel      COVID-19 At Home Antigen Test (CARESTART COVID-19 HOME TEST) KIT Use as directed 2 kit 0   Crisaborole (EUCRISA) 2 % OINT Apply 1 application topically 2 (two) times daily. 60 g 1   diazepam (VALIUM) 5 MG tablet Take 1 tablet (5 mg total) by mouth every 8 (eight) hours as needed (vertigo). 8 tablet 0   famotidine (PEPCID) 20 MG tablet TAKE 1 TABLET BY MOUTH TWICE DAILY 60 tablet 11   hydrocortisone 2.5 % ointment APPLY TWICE A DAY FOR 2 WEEKS 28.35 g 0   influenza vac split quadrivalent PF (FLUARIX) 0.5 ML injection Inject into the muscle. 0.5 mL 0   losartan-hydrochlorothiazide (HYZAAR) 50-12.5 MG tablet TAKE 1 TABLET BY MOUTH ONCE DAILY 90 tablet 2   Magnesium 200 MG TABS  meclizine (ANTIVERT) 25 MG tablet Take 25 mg by mouth 2 (two) times daily as needed for dizziness.     melatonin 3 MG TABS tablet Take 3 mg by mouth at bedtime as needed.     methocarbamol (ROBAXIN) 500 MG tablet  Take 500 mg by mouth every 6 (six) hours as needed.     metoprolol succinate (TOPROL XL) 25 MG 24 hr tablet Take 1/2 tablet (12.5 mg total) by mouth at bedtime. 45 tablet 3   metroNIDAZOLE (METROCREAM) 0.75 % cream      ondansetron (ZOFRAN ODT) 4 MG disintegrating tablet Take 1 tablet (4 mg total) by mouth every 8 (eight) hours as needed for nausea or vomiting. 20 tablet 0   OSCIMIN 0.125 MG SUBL PLACE 1 TABLET (0.125 MG TOTAL) UNDER THE TONGUE EVERY 4 (FOUR) HOURS AS NEEDED. 60 tablet 0   Potassium Chloride ER 20 MEQ TBCR Take 1 tablet by mouth 2 times daily with food 180 tablet 2   rosuvastatin (CRESTOR) 20 MG tablet Take 1 tablet (20 mg total) by mouth daily. 90 tablet 3   vitamin B-12 (CYANOCOBALAMIN) 500 MCG tablet Take 500 mcg by mouth daily.     Zinc 50 MG TABS      Zoster Vaccine Adjuvanted [facility]) injection Inject into the muscle. 1 each 1   Current Facility-Administered Medications  Medication Dose Route Frequency Provider Last Rate Last Admin   0.9 %  sodium chloride infusion  500 mL Intravenous Once Gatha Mayer, MD        Allergies as of 11/17/2021 - Review Complete 11/17/2021  Allergen Reaction Noted   Buspirone Other (See Comments) 08/24/2012   Esomeprazole Other (See Comments) 07/09/2016   Lansoprazole  12/16/2015   Omeprazole  12/16/2015   Proton pump inhibitors  05/22/2020   Sulfonamide derivatives Rash 11/21/2009    Vitals: BP 121/76    Pulse 83    Ht _0  (1.549 m)    Wt 140 lb (63.5 kg)    LMP 11/15/2018    BMI 26.45 kg/m  Last Weight:  Wt Readings from Last 1 Encounters:  11/17/21 140 lb (63.5 kg)   Last Height:   Ht Readings from Last 1 Encounters:  11/17/21 _1  (1.549 m)    Exam: NAD, pleasant                  Speech:    Speech is normal; fluent and spontaneous with normal comprehension.  Cognition:    The patient is oriented to person, place, and time;     recent and remote memory intact;     language fluent;    Cranial Nerves:    The  pupils are equal, round, and reactive to light.Trigeminal sensation is intact and the muscles of mastication are normal. The face is symmetric. The palate elevates in the midline. Hearing intact. Voice is normal. Shoulder shrug is normal. The tongue has normal motion without fasciculations.   Coordination:  No dysmetria  Motor Observation:    No asymmetry, no atrophy, and no involuntary movements noted. Tone:    Normal muscle tone.     Strength:    Strength is V/V in the upper and lower limbs.      Sensation: intact to LT     Assessment/Plan: I saw patient in the past for left facial numbness, MRI with trigeminal nerve was negative (personally reviewed images), today she is here for new issues feeling cognitive changes since COVID, some confusion, dizziness, not feeling "  normal in the head", ringing in the left ear, hearing loss right ear, right temporal headache, left eye pressure and vision changes.  I think she needs an MRI of the brain.  Much less likely but will check an ESR and CRP for temporal areteritis.  MS protocol; THIN CUTS THROUH IAC: MRI brain due to concerning symptoms of right temporal headaches, left vision changes and left eye pressure , dizziness, cognition changes to look for space occupying mass, chiari or intracranial hypertension (pseudotumor), demyelination, strokes, masses, schwannoma or other lesions  Musculoskeletal neckpain: conservative treatment, dry needling  Orders Placed This Encounter  Procedures   MR BRAIN W WO CONTRAST   Sedimentation rate   C-reactive protein   Basic Metabolic Panel     Cc:   Kathyrn Lass, MD  Sarina Ill, MD  [facility] [address] [place], [place] [address]  Phone [phone] Fax [phone]  I spent over 40 minutes of face-to-face and non-face-to-face time with patient on the  1. Left temporal headache   2. Vision loss of left eye   3. Left eye pain   4. Cognitive changes    5. Left-sided tinnitus   6. Sensorineural hearing loss (SNHL) of right ear, unspecified hearing status on contralateral side    diagnosis.  This included previsit chart review, lab review, study review, order entry, electronic health record documentation, patient education on the different diagnostic and therapeutic options, counseling and coordination of care, risks and benefits of management, compliance, or risk factor reduction

## 2021-11-18 LAB — BASIC METABOLIC PANEL
BUN/Creatinine Ratio: 28 — ABNORMAL HIGH (ref 9–23)
BUN: 17 mg/dL (ref 6–24)
CO2: 30 mmol/L — ABNORMAL HIGH (ref 20–29)
Calcium: 10 mg/dL (ref 8.7–10.2)
Chloride: 100 mmol/L (ref 96–106)
Creatinine, Ser: 0.61 mg/dL (ref 0.57–1.00)
Glucose: 156 mg/dL — ABNORMAL HIGH (ref 70–99)
Potassium: 4.1 mmol/L (ref 3.5–5.2)
Sodium: 142 mmol/L (ref 134–144)
eGFR: 104 mL/min/{1.73_m2} (ref >59)

## 2021-11-18 LAB — C-REACTIVE PROTEIN: CRP: 1 mg/L (ref 0–10)

## 2021-11-18 LAB — SEDIMENTATION RATE: Sed Rate: 4 mm/hr (ref 0–40)

## 2021-11-19 ENCOUNTER — Other Ambulatory Visit: Payer: Self-pay

## 2021-11-19 ENCOUNTER — Ambulatory Visit (INDEPENDENT_AMBULATORY_CARE_PROVIDER_SITE_OTHER): Payer: 59

## 2021-11-19 DIAGNOSIS — Z1231 Encounter for screening mammogram for malignant neoplasm of breast: Secondary | ICD-10-CM

## 2021-11-21 ENCOUNTER — Encounter: Payer: Self-pay | Admitting: Neurology

## 2021-11-25 ENCOUNTER — Ambulatory Visit (INDEPENDENT_AMBULATORY_CARE_PROVIDER_SITE_OTHER): Payer: 59

## 2021-11-25 ENCOUNTER — Other Ambulatory Visit: Payer: Self-pay

## 2021-11-25 DIAGNOSIS — H9312 Tinnitus, left ear: Secondary | ICD-10-CM | POA: Diagnosis not present

## 2021-11-25 DIAGNOSIS — G44009 Cluster headache syndrome, unspecified, not intractable: Secondary | ICD-10-CM | POA: Diagnosis not present

## 2021-11-25 DIAGNOSIS — R519 Headache, unspecified: Secondary | ICD-10-CM | POA: Diagnosis not present

## 2021-11-25 DIAGNOSIS — H5712 Ocular pain, left eye: Secondary | ICD-10-CM | POA: Diagnosis not present

## 2021-11-25 DIAGNOSIS — R4189 Other symptoms and signs involving cognitive functions and awareness: Secondary | ICD-10-CM

## 2021-11-25 DIAGNOSIS — H5462 Unqualified visual loss, left eye, normal vision right eye: Secondary | ICD-10-CM

## 2021-11-25 DIAGNOSIS — H9191 Unspecified hearing loss, right ear: Secondary | ICD-10-CM | POA: Diagnosis not present

## 2021-11-25 MED ORDER — GADOBUTROL 1 MMOL/ML IV SOLN
6.0000 mL | Freq: Once | INTRAVENOUS | Status: AC | PRN
  Administered 2021-11-25: 14:00:00 6 mL via INTRAVENOUS

## 2021-11-27 ENCOUNTER — Telehealth: Payer: Self-pay | Admitting: Neurology

## 2021-11-27 NOTE — Telephone Encounter (Signed)
Spoke with patient. She is aware of the MRI result and she accepted the apology that unfortunately the full imaging of her brain was not completed. She is in agreement to go back and have the remaining imaging completed and understands this should be at no additional charge to her. She is also aware that Dr Jaynee Eagles is out of the office unexpectedly and she is ok with waiting until Dr Jaynee Eagles is back in the office. She was appreciative for the call.

## 2021-11-27 NOTE — Telephone Encounter (Signed)
Please see my result note for her MRI and call patient as requested thanks

## 2021-11-27 NOTE — Telephone Encounter (Signed)
Melvenia Beam, MD  11/27/2021  1:17 PM EST Back to Top    Patient brain MRI was unremarkable, nothing concerning. Unfortunately they did not image the back of the brain, this must have been a misunderstanding because I ordered "MS protocol; THIN CUTS THROUH IAC:" and not just a trigeminal protocol. Its unfortunate but patient may have to go back and get the rest of her brain imaged. Please call and discuss with her and if she is amenable I will emails the neuroradiologist and she can go back at no charge for the rest of her brain. Please discuss with patient and let me know, apologize for the inconvenience. thanks

## 2021-12-01 ENCOUNTER — Encounter: Payer: Self-pay | Admitting: Neurology

## 2021-12-03 ENCOUNTER — Other Ambulatory Visit (HOSPITAL_BASED_OUTPATIENT_CLINIC_OR_DEPARTMENT_OTHER): Payer: Self-pay

## 2021-12-04 ENCOUNTER — Encounter: Payer: Self-pay | Admitting: Neurology

## 2021-12-04 ENCOUNTER — Other Ambulatory Visit (HOSPITAL_BASED_OUTPATIENT_CLINIC_OR_DEPARTMENT_OTHER): Payer: Self-pay

## 2021-12-04 ENCOUNTER — Encounter: Payer: Self-pay | Admitting: Internal Medicine

## 2021-12-04 MED ORDER — POTASSIUM CHLORIDE CRYS ER 20 MEQ PO TBCR
20.0000 meq | EXTENDED_RELEASE_TABLET | Freq: Two times a day (BID) | ORAL | 2 refills | Status: DC
  Filled 2021-12-04: qty 180, 90d supply, fill #0

## 2021-12-05 ENCOUNTER — Other Ambulatory Visit (HOSPITAL_BASED_OUTPATIENT_CLINIC_OR_DEPARTMENT_OTHER): Payer: Self-pay

## 2021-12-17 ENCOUNTER — Encounter: Payer: Self-pay | Admitting: Cardiology

## 2021-12-24 DIAGNOSIS — R931 Abnormal findings on diagnostic imaging of heart and coronary circulation: Secondary | ICD-10-CM | POA: Diagnosis not present

## 2021-12-24 LAB — LIPID PANEL

## 2021-12-25 ENCOUNTER — Other Ambulatory Visit (HOSPITAL_BASED_OUTPATIENT_CLINIC_OR_DEPARTMENT_OTHER): Payer: Self-pay

## 2021-12-25 LAB — HEPATIC FUNCTION PANEL
ALT: 22 IU/L (ref 0–32)
AST: 22 IU/L (ref 0–40)
Albumin: 4.6 g/dL (ref 3.8–4.9)
Alkaline Phosphatase: 76 IU/L (ref 44–121)
Bilirubin Total: 0.3 mg/dL (ref 0.0–1.2)
Bilirubin, Direct: 0.12 mg/dL (ref 0.00–0.40)
Total Protein: 7.5 g/dL (ref 6.0–8.5)

## 2021-12-25 LAB — LIPID PANEL
Chol/HDL Ratio: 2.6 ratio (ref 0.0–4.4)
Cholesterol, Total: 152 mg/dL (ref 100–199)
HDL: 58 mg/dL (ref >39)
LDL Chol Calc (NIH): 76 mg/dL (ref 0–99)
Triglycerides: 97 mg/dL (ref 0–149)
VLDL Cholesterol Cal: 18 mg/dL (ref 5–40)

## 2021-12-29 ENCOUNTER — Other Ambulatory Visit: Payer: Self-pay | Admitting: *Deleted

## 2021-12-29 ENCOUNTER — Other Ambulatory Visit (HOSPITAL_BASED_OUTPATIENT_CLINIC_OR_DEPARTMENT_OTHER): Payer: Self-pay

## 2021-12-29 DIAGNOSIS — R931 Abnormal findings on diagnostic imaging of heart and coronary circulation: Secondary | ICD-10-CM

## 2021-12-29 MED ORDER — ROSUVASTATIN CALCIUM 40 MG PO TABS
40.0000 mg | ORAL_TABLET | Freq: Every day | ORAL | 3 refills | Status: DC
  Filled 2021-12-29: qty 90, 90d supply, fill #0

## 2021-12-30 ENCOUNTER — Encounter: Payer: Self-pay | Admitting: Cardiology

## 2022-01-01 ENCOUNTER — Other Ambulatory Visit (HOSPITAL_BASED_OUTPATIENT_CLINIC_OR_DEPARTMENT_OTHER): Payer: Self-pay

## 2022-01-03 ENCOUNTER — Encounter: Payer: Self-pay | Admitting: Cardiology

## 2022-01-20 ENCOUNTER — Encounter: Payer: Self-pay | Admitting: Cardiology

## 2022-01-21 ENCOUNTER — Other Ambulatory Visit (HOSPITAL_BASED_OUTPATIENT_CLINIC_OR_DEPARTMENT_OTHER): Payer: Self-pay

## 2022-01-21 MED ORDER — CARESTART COVID-19 HOME TEST VI KIT
PACK | 0 refills | Status: DC
  Filled 2022-01-21: qty 2, 4d supply, fill #0

## 2022-01-26 ENCOUNTER — Other Ambulatory Visit (HOSPITAL_BASED_OUTPATIENT_CLINIC_OR_DEPARTMENT_OTHER): Payer: Self-pay

## 2022-02-01 ENCOUNTER — Encounter: Payer: Self-pay | Admitting: Cardiology

## 2022-02-01 ENCOUNTER — Other Ambulatory Visit: Payer: Self-pay | Admitting: Internal Medicine

## 2022-02-02 ENCOUNTER — Other Ambulatory Visit (HOSPITAL_BASED_OUTPATIENT_CLINIC_OR_DEPARTMENT_OTHER): Payer: Self-pay

## 2022-02-02 MED ORDER — FAMOTIDINE 20 MG PO TABS
ORAL_TABLET | Freq: Two times a day (BID) | ORAL | 11 refills | Status: DC
  Filled 2022-02-02: qty 60, 30d supply, fill #0
  Filled 2022-06-19: qty 60, 30d supply, fill #1
  Filled 2022-10-07: qty 60, 30d supply, fill #2

## 2022-02-11 ENCOUNTER — Other Ambulatory Visit (HOSPITAL_BASED_OUTPATIENT_CLINIC_OR_DEPARTMENT_OTHER): Payer: Self-pay

## 2022-02-11 DIAGNOSIS — L0292 Furuncle, unspecified: Secondary | ICD-10-CM | POA: Diagnosis not present

## 2022-02-11 DIAGNOSIS — L570 Actinic keratosis: Secondary | ICD-10-CM | POA: Diagnosis not present

## 2022-02-11 DIAGNOSIS — Z85828 Personal history of other malignant neoplasm of skin: Secondary | ICD-10-CM | POA: Diagnosis not present

## 2022-02-11 DIAGNOSIS — L821 Other seborrheic keratosis: Secondary | ICD-10-CM | POA: Diagnosis not present

## 2022-02-11 DIAGNOSIS — D2272 Melanocytic nevi of left lower limb, including hip: Secondary | ICD-10-CM | POA: Diagnosis not present

## 2022-02-11 DIAGNOSIS — Z08 Encounter for follow-up examination after completed treatment for malignant neoplasm: Secondary | ICD-10-CM | POA: Diagnosis not present

## 2022-02-11 DIAGNOSIS — L814 Other melanin hyperpigmentation: Secondary | ICD-10-CM | POA: Diagnosis not present

## 2022-02-11 MED ORDER — FLUOROURACIL 5 % EX CREA
TOPICAL_CREAM | CUTANEOUS | 0 refills | Status: DC
  Filled 2022-02-11: qty 40, 30d supply, fill #0

## 2022-02-11 MED ORDER — CLINDAMYCIN PHOSPHATE 1 % EX LOTN
TOPICAL_LOTION | CUTANEOUS | 5 refills | Status: DC
  Filled 2022-02-11: qty 60, 30d supply, fill #0

## 2022-02-13 ENCOUNTER — Other Ambulatory Visit (HOSPITAL_BASED_OUTPATIENT_CLINIC_OR_DEPARTMENT_OTHER): Payer: Self-pay

## 2022-02-20 ENCOUNTER — Emergency Department (INDEPENDENT_AMBULATORY_CARE_PROVIDER_SITE_OTHER)
Admission: EM | Admit: 2022-02-20 | Discharge: 2022-02-20 | Disposition: A | Discharge status: Home / Self Care | Payer: 59 | Source: Home / Self Care

## 2022-02-20 ENCOUNTER — Other Ambulatory Visit (HOSPITAL_BASED_OUTPATIENT_CLINIC_OR_DEPARTMENT_OTHER): Payer: Self-pay

## 2022-02-20 DIAGNOSIS — L03032 Cellulitis of left toe: Secondary | ICD-10-CM

## 2022-02-20 DIAGNOSIS — R42 Dizziness and giddiness: Secondary | ICD-10-CM | POA: Diagnosis not present

## 2022-02-20 MED ORDER — AMOXICILLIN-POT CLAVULANATE 875-125 MG PO TABS
1.0000 | ORAL_TABLET | Freq: Two times a day (BID) | ORAL | 0 refills | Status: DC
Start: 2022-02-20 — End: 2022-05-27
  Filled 2022-02-20: qty 14, 7d supply, fill #0

## 2022-02-20 NOTE — ED Provider Notes (Signed)
?[facility] ? ? ? ?CSN: [id number] ?Arrival date & time: 02/20/22  1549 ? ? ?  ? ?History   ?Chief Complaint ?Chief Complaint  ?Patient presents with  ? Dizziness  ? ? ?HPI ?Nicole Crosby is a 56 y.o. female.  ? ?HPI 56 year old female presents with dizziness that started suddenly last night at 9 PM and woke up again with it in the evening and again this morning.  Patient reports history of vertigo 5 to 6 years ago and took meclizine this morning, with some relief.  Additionally, patient would like her left great toe check for possible infection.  PMH significant for HTN, hypokalemia, and trigeminal nerve disorder. ? ?Past Medical History:  ?Diagnosis Date  ? Allergy   ? Buspirone, Sulfa, Pantoprazole  ? Benign fundic gland polyps of stomach 02/12/2016  ? Chicken pox   ? Dizziness   ? Eczema   ? Functional dyspepsia   ? GERD (gastroesophageal reflux disease)   ? H/O adenomatous polyp of colon 10/04/2014  ? 09/2014 - 3 mm adenoma - repeat colonoscopy 2022  ? Hyperlipidemia   ? no meds  ? Hypertension   ? pt denies  ? IBS (irritable bowel syndrome)   ? Insomnia   ? MEDIAL EPICONDYLITIS, RIGHT 07/17/2010  ? Nonulcer dyspepsia 02/12/2016  ? Other abnormal glucose   ? Tiny hypodensity left hepatic lobe-CT of abdomen and pelvis 0/16/0109-NATFT 34m periumblical hernia  ? PAC (premature atrial contraction)   ? Palpitations   ? ? ?Patient Active Problem List  ? Diagnosis Date Noted  ? 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 Nausea and vomiting 12/25/2019  ? Abdominal pain 12/25/2019  ? Constipation 12/25/2019  ? Change in bowel habit 12/25/2019  ? Hypokalemia 09/20/2019  ? Flexural eczema 05/17/2018  ? Eyelid dermatitis, allergic/contact 05/17/2018  ? Medial epicondylitis, right 03/17/2018  ? 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 Vitamin D deficiency 11/03/2017  ? Shortened PR interval 09/27/2017  ? Greater trochanteric bursitis of left hip 05/12/2017  ? B12 deficiency 01/22/2017  ? Memory  changes 08/28/2016  ? Nonulcer dyspepsia 02/12/2016  ? Benign fundic gland polyps of stomach 02/12/2016  ? Paresthesia 12/17/2015  ? Bladder spasm 12/13/2014  ? H/O adenomatous polyp of colon 10/04/2014  ? Screening for malignant neoplasm of cervix 05/31/2013  ? Physical exam 05/31/2013  ? Left L3 lumbar radiculitis 09/23/2012  ? Neck pain 08/22/2012  ? Adrenal nodule (HVincent 03/02/2012  ? Essential hypertension, benign 03/02/2012  ? IBS (irritable bowel syndrome)   ? PAC (premature atrial contraction)   ? Insomnia   ? GERD (gastroesophageal reflux disease)   ? DYSTHYMIA 09/30/2009  ? HEADACHE 09/21/2008  ? HEPATIC CYST 08/14/2008  ? Hyperlipidemia 08/06/2008  ? ? ?Past Surgical History:  ?Procedure Laterality Date  ? CESAREAN SECTION  01/2001  ? COLONOSCOPY    ? [place]  2000  ? Right wrist area  ? ESOPHAGOGASTRODUODENOSCOPY    ? ? ?OB History   ? ? Gravida  ?2  ? Para  ?2  ? Term  ?   ? Preterm  ?   ? AB  ?   ? Living  ?   ?  ? ? SAB  ?   ? IAB  ?   ? Ectopic  ?   ? Multiple  ?   ? Live Births  ?   ?   ?  ?  ? ? ? ?Home Medications   ? ?Prior to  Admission medications   ?Medication Sig Start Date End Date Taking? Authorizing Provider  ?amoxicillin-clavulanate (AUGMENTIN) 875-125 MG tablet Take 1 tablet by mouth every 12 (twelve) hours. 02/20/22  Yes Eliezer Lofts, FNP  ?Calcium Carbonate-Vit D-Min (CALCIUM 1200 PO) Take 1,200 Units by mouth daily.    [provider]  ?cholecalciferol (VITAMIN D) 1000 UNITS tablet Take 1,200 Units by mouth daily.    [provider]  ?clindamycin (CLEOCIN T) 1 % lotion Apply to rash once daily as needed 02/11/22     ?clindamycin (CLINDAGEL) 1 % gel  02/13/20   [provider]  ?COVID-19 At Home Antigen Test [facility] COVID-19 HOME TEST) KIT Use as directed. 01/21/22   Clementeen Graham, [place]  ?Crisaborole (EUCRISA) 2 % OINT Apply 1 application topically 2 (two) times daily. 05/04/18   Midge Minium, MD  ?diazepam (VALIUM) 5 MG tablet Take 1  tablet (5 mg total) by mouth every 8 (eight) hours as needed (vertigo). 12/19/17   Carlisle Cater, PA-C  ?famotidine (PEPCID) 20 MG tablet TAKE 1 TABLET BY MOUTH TWICE DAILY 02/02/22 02/02/23  Gatha Mayer, MD  ?fluorouracil (EFUDEX) 5 % cream Apply two times a day to forehead for 3 weeks. Wash off in am. 02/11/22     ?hydrocortisone 2.5 % ointment APPLY TWICE A DAY FOR 2 WEEKS 03/12/21     ?influenza vac split quadrivalent PF (FLUARIX) 0.5 ML injection Inject into the muscle. 08/20/21   Carlyle Basques, MD  ?losartan-hydrochlorothiazide (HYZAAR) 50-12.5 MG tablet TAKE 1 TABLET BY MOUTH ONCE DAILY 09/22/21     ?Magnesium 200 MG TABS     [provider]  ?meclizine (ANTIVERT) 25 MG tablet Take 25 mg by mouth 2 (two) times daily as needed for dizziness.    [provider]  ?melatonin 3 MG TABS tablet Take 3 mg by mouth at bedtime as needed.    [provider]  ?methocarbamol (ROBAXIN) 500 MG tablet Take 500 mg by mouth every 6 (six) hours as needed. 04/23/20   [provider]  ?metoprolol succinate (TOPROL XL) 25 MG 24 hr tablet Take 1/2 tablet (12.5 mg total) by mouth at bedtime. 10/17/21   Lelon Perla, MD  ?metroNIDAZOLE (METROCREAM) 0.75 % cream  02/13/20   [provider]  ?ondansetron (ZOFRAN ODT) 4 MG disintegrating tablet Take 1 tablet (4 mg total) by mouth every 8 (eight) hours as needed for nausea or vomiting. 11/09/20   Maudie Flakes, MD  ?OSCIMIN 0.125 MG SUBL PLACE 1 TABLET (0.125 MG TOTAL) UNDER THE TONGUE EVERY 4 (FOUR) HOURS AS NEEDED. 12/12/19   Gatha Mayer, MD  ?Potassium Chloride ER 20 MEQ TBCR Take 1 tablet by mouth 2 times daily with food 09/01/21     ?potassium chloride SA (KLOR-CON M) 20 MEQ tablet Take 1 tablet (20 mEq total) by mouth 2 (two) times daily. 12/04/21     ?rosuvastatin (CRESTOR) 40 MG tablet Take 1 tablet (40 mg total) by mouth daily. 12/29/21 04/01/22  Lelon Perla, MD  ?vitamin B-12 (CYANOCOBALAMIN) 500 MCG tablet Take 500 mcg by  mouth daily.    [provider]  ?Zinc 50 MG TABS     [provider]  ?Zoster Vaccine Adjuvanted [facility]) injection Inject into the muscle. 09/19/21   Carlyle Basques, MD  ? ? ?Family History ?Family History  ?Problem Relation Age of Onset  ? Hypertension Mother   ? Hyperlipidemia Mother   ? Fibrocystic breast disease Mother   ? Hearing  loss Mother   ? Osteopenia Mother   ? Dementia Mother   ? Prostate cancer Father 22  ?     deceased  ? Hypertension Father   ? Diabetes Father   ? Kidney disease Father   ? Heart disease Father   ?     MI  ? Hyperlipidemia Father   ? Thyroid disease Sister   ? Diabetes Sister   ?     pre-diabetes  ? Hypertension Sister   ? Arthritis Sister   ? Diabetes Sister   ? Hypertension Sister   ? Hyperlipidemia Sister   ? Fibrocystic breast disease Sister   ? Hyperlipidemia Sister   ? Hypertension Sister   ? Other Brother   ?     unknown  ? Colon cancer Maternal Uncle   ?     died in late 32's  ? Diabetes Maternal Grandmother   ? Prostate cancer Maternal Grandfather   ? COPD Maternal Grandfather   ? Rectal cancer Neg Hx   ? Stomach cancer Neg Hx   ? Esophageal cancer Neg Hx   ? Pancreatic cancer Neg Hx   ? ? ?Social History ?Social History  ? ?Tobacco Use  ? Smoking status: Never  ? Smokeless tobacco: Never  ?Vaping Use  ? Vaping Use: Never used  ?Substance Use Topics  ? Alcohol use: Yes  ?  Alcohol/week: 1.0 standard drink  ?  Types: 1 Glasses of wine per week  ?  Comment: occasional  ? Drug use: No  ? ? ? ?Allergies   ?Buspirone, Esomeprazole, Lansoprazole, Omeprazole, Proton pump inhibitors, and Sulfonamide derivatives ? ? ?Review of Systems ?Review of Systems  ?Skin:  Positive for rash.  ?Neurological:  Positive for dizziness.  ?All other systems reviewed and are negative. ? ? ?Physical Exam ?Triage Vital Signs ?ED Triage Vitals [02/20/22 1609]  ?Enc Vitals Group  ?   BP (!) 143/79  ?   Pulse Rate 84  ?   Resp 17  ?   Temp 98.7 ?F (37.1 ?C)  ?   Temp Source Oral  ?    SpO2 99 %  ?   Weight   ?   Height   ?   Head Circumference   ?   Peak Flow   ?   Pain Score   ?   Pain Loc   ?   Pain Edu?   ?   Excl. in [place]?   ? ?No data found. ? ?Updated Vital Signs ?BP (!) 143/79 (BP Location:

## 2022-02-20 NOTE — ED Triage Notes (Signed)
Pt c/o dizziness that started suddenly last night at 9pm. Woke up again with it in the evening. Also again this morning. Took meclizine this am at 545am. Overall just feeling "swimmy headed" today. Hx of vertigo 5-6 years ago. Would also like her LT big toe checked for possible infection.  ?

## 2022-02-20 NOTE — Discharge Instructions (Addendum)
Instructed patient to take medication as directed with food to completion.  Encouraged patient to increase daily water intake while taking this medication.  Advised patient if dizziness worsens and/or unresolved please follow-up with PCP or here for further evaluation. ?

## 2022-02-26 DIAGNOSIS — L03032 Cellulitis of left toe: Secondary | ICD-10-CM | POA: Diagnosis not present

## 2022-03-03 ENCOUNTER — Other Ambulatory Visit (HOSPITAL_BASED_OUTPATIENT_CLINIC_OR_DEPARTMENT_OTHER): Payer: Self-pay

## 2022-03-10 DIAGNOSIS — H524 Presbyopia: Secondary | ICD-10-CM | POA: Diagnosis not present

## 2022-03-10 DIAGNOSIS — H52223 Regular astigmatism, bilateral: Secondary | ICD-10-CM | POA: Diagnosis not present

## 2022-03-10 DIAGNOSIS — H5203 Hypermetropia, bilateral: Secondary | ICD-10-CM | POA: Diagnosis not present

## 2022-03-11 ENCOUNTER — Other Ambulatory Visit (HOSPITAL_BASED_OUTPATIENT_CLINIC_OR_DEPARTMENT_OTHER): Payer: Self-pay

## 2022-03-11 MED ORDER — COVID-19 AT HOME ANTIGEN TEST VI KIT
PACK | 0 refills | Status: DC
  Filled 2022-03-11: qty 4, 8d supply, fill #0

## 2022-03-23 ENCOUNTER — Other Ambulatory Visit (HOSPITAL_BASED_OUTPATIENT_CLINIC_OR_DEPARTMENT_OTHER): Payer: Self-pay

## 2022-04-24 DIAGNOSIS — R931 Abnormal findings on diagnostic imaging of heart and coronary circulation: Secondary | ICD-10-CM | POA: Diagnosis not present

## 2022-04-25 LAB — LIPID PANEL
Chol/HDL Ratio: 3.3 ratio (ref 0.0–4.4)
Cholesterol, Total: 182 mg/dL (ref 100–199)
HDL: 56 mg/dL (ref >39)
LDL Chol Calc (NIH): 111 mg/dL — ABNORMAL HIGH (ref 0–99)
Triglycerides: 84 mg/dL (ref 0–149)
VLDL Cholesterol Cal: 15 mg/dL (ref 5–40)

## 2022-04-25 LAB — HEPATIC FUNCTION PANEL
ALT: 14 IU/L (ref 0–32)
AST: 19 IU/L (ref 0–40)
Albumin: 4.4 g/dL (ref 3.8–4.9)
Alkaline Phosphatase: 76 IU/L (ref 44–121)
Bilirubin Total: 0.4 mg/dL (ref 0.0–1.2)
Bilirubin, Direct: 0.12 mg/dL (ref 0.00–0.40)
Total Protein: 7.2 g/dL (ref 6.0–8.5)

## 2022-04-29 ENCOUNTER — Other Ambulatory Visit: Payer: Self-pay | Admitting: *Deleted

## 2022-04-29 ENCOUNTER — Other Ambulatory Visit (HOSPITAL_BASED_OUTPATIENT_CLINIC_OR_DEPARTMENT_OTHER): Payer: Self-pay

## 2022-04-29 DIAGNOSIS — R931 Abnormal findings on diagnostic imaging of heart and coronary circulation: Secondary | ICD-10-CM

## 2022-04-29 MED ORDER — EZETIMIBE 10 MG PO TABS
10.0000 mg | ORAL_TABLET | Freq: Every day | ORAL | 3 refills | Status: DC
  Filled 2022-04-29: qty 90, 90d supply, fill #0

## 2022-05-04 ENCOUNTER — Other Ambulatory Visit (HOSPITAL_BASED_OUTPATIENT_CLINIC_OR_DEPARTMENT_OTHER): Payer: Self-pay

## 2022-05-18 NOTE — Progress Notes (Signed)
HPI: FU dyspnea and chest pain. Echocardiogram in August of 2010 revealed normal LV function and no valvular abnormalities. Stress echocardiogram December 2010 normal. CardioNet 2010 showed sinus to sinus tachycardia with rare PAC. Stress echo 6/15 normal. ETT 11/19 negative adequate.  Cardiac CTA February 2022 showed minimal nonobstructive coronary disease and calcium score of 0.7.  Monitor December 2022 showed sinus rhythm with occasional PAC, brief PAT and rare PVC.  Since last seen she has occasional sharp pains in the left lateral chest area lasting seconds.  There is associated dyspnea.  Otherwise no exertional chest pain.  She has occasional palpitations with dizziness.  She has not had syncope.  Current Outpatient Medications  Medication Sig Dispense Refill   Calcium Carbonate-Vit D-Min (CALCIUM 1200 PO) Take 1,200 Units by mouth daily.     cholecalciferol (VITAMIN D) 1000 UNITS tablet Take 1,200 Units by mouth daily.     clindamycin (CLEOCIN T) 1 % lotion Apply to rash once daily as needed 60 mL 5   COVID-19 At Home Antigen Test (CARESTART COVID-19 HOME TEST) KIT Use as directed 4 kit 0   Crisaborole (EUCRISA) 2 % OINT Apply 1 application topically 2 (two) times daily. 60 g 1   diazepam (VALIUM) 5 MG tablet Take 1 tablet (5 mg total) by mouth every 8 (eight) hours as needed (vertigo). 8 tablet 0   ezetimibe (ZETIA) 10 MG tablet Take 1 tablet (10 mg total) by mouth daily. 90 tablet 3   famotidine (PEPCID) 20 MG tablet TAKE 1 TABLET BY MOUTH TWICE DAILY 60 tablet 11   hydrocortisone 2.5 % ointment APPLY TWICE A DAY FOR 2 WEEKS 28.35 g 0   influenza vac split quadrivalent PF (FLUARIX) 0.5 ML injection Inject into the muscle. 0.5 mL 0   losartan-hydrochlorothiazide (HYZAAR) 50-12.5 MG tablet TAKE 1 TABLET BY MOUTH ONCE DAILY 90 tablet 2   Magnesium 200 MG TABS      meclizine (ANTIVERT) 25 MG tablet Take 25 mg by mouth 2 (two) times daily as needed for dizziness.     melatonin 3 MG  TABS tablet Take 3 mg by mouth at bedtime as needed.     methocarbamol (ROBAXIN) 500 MG tablet Take 500 mg by mouth every 6 (six) hours as needed.     metoprolol succinate (TOPROL XL) 25 MG 24 hr tablet Take 1/2 tablet (12.5 mg total) by mouth at bedtime. 45 tablet 3   metroNIDAZOLE (METROCREAM) 0.75 % cream      ondansetron (ZOFRAN ODT) 4 MG disintegrating tablet Take 1 tablet (4 mg total) by mouth every 8 (eight) hours as needed for nausea or vomiting. 20 tablet 0   OSCIMIN 0.125 MG SUBL PLACE 1 TABLET (0.125 MG TOTAL) UNDER THE TONGUE EVERY 4 (FOUR) HOURS AS NEEDED. 60 tablet 0   Potassium Chloride ER 20 MEQ TBCR Take 1 tablet by mouth 2 times daily with food 180 tablet 2   potassium chloride SA (KLOR-CON M) 20 MEQ tablet Take 1 tablet (20 mEq total) by mouth 2 (two) times daily. 180 tablet 2   vitamin B-12 (CYANOCOBALAMIN) 500 MCG tablet Take 500 mcg by mouth daily.     Zinc 50 MG TABS      Zoster Vaccine Adjuvanted [facility]) injection Inject into the muscle. 1 each 1   Current Facility-Administered Medications  Medication Dose Route Frequency Provider Last Rate Last Admin   0.9 %  sodium chloride infusion  500 mL Intravenous Once Gatha Mayer, MD  Past Medical History:  Diagnosis Date   Allergy    Buspirone, Sulfa, Pantoprazole   Benign fundic gland polyps of stomach 02/12/2016   Chicken pox    Dizziness    Eczema    Functional dyspepsia    GERD (gastroesophageal reflux disease)    H/O adenomatous polyp of colon 10/04/2014   09/2014 - 3 mm adenoma - repeat colonoscopy 2022   Hyperlipidemia    no meds   Hypertension    pt denies   IBS (irritable bowel syndrome)    Insomnia    MEDIAL EPICONDYLITIS, RIGHT 07/17/2010   Nonulcer dyspepsia 02/12/2016   Other abnormal glucose    Tiny hypodensity left hepatic lobe-CT of abdomen and pelvis 04/29/3150-VOHYW 85m periumblical hernia   PAC (premature atrial contraction)    Palpitations     Past Surgical History:   Procedure Laterality Date   CESAREAN SECTION  01/2001   COLONOSCOPY     DE QUERVAIN'S RELEASE  2000   Right wrist area   ESOPHAGOGASTRODUODENOSCOPY      Social History   Socioeconomic History   Marital status: Married    Spouse name: Not on file   Number of children: 2   Years of education: Not on file   Highest education level: Not on file  Occupational History   Occupation: Xray/CT TEngineer, production [place]  Tobacco Use   Smoking status: Never   Smokeless tobacco: Never  Vaping Use   Vaping Use: Never used  Substance and Sexual Activity   Alcohol use: Yes    Alcohol/week: 1.0 standard drink of alcohol    Types: 1 Glasses of wine per week    Comment: occasional   Drug use: No   Sexual activity: Yes    Birth control/protection: Inserts  Other Topics Concern   Not on file  Social History Narrative   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    Alcohol Use - yes-wine         Full Time    Married           Social Determinants of Health   Financial Resource Strain: Not on file  Food Insecurity: Not on file  Transportation Needs: Not on file  Physical Activity: Not on file  Stress: Not on file  Social Connections: Not on file  Intimate Partner Violence: Not on file    Family History  Problem Relation Age of Onset   Hypertension Mother    Hyperlipidemia Mother    Fibrocystic breast disease Mother    Hearing loss Mother    Osteopenia Mother    Dementia Mother    Prostate cancer Father 730      deceased   Hypertension Father    Diabetes Father    Kidney disease Father    Heart disease Father        MI   Hyperlipidemia Father    Thyroid disease Sister    Diabetes Sister        pre-diabetes   Hypertension Sister    Arthritis Sister    Diabetes Sister    Hypertension Sister    Hyperlipidemia Sister    Fibrocystic breast disease Sister    Hyperlipidemia Sister    Hypertension Sister    Other Brother        unknown   Colon  cancer Maternal Uncle        died in late 748's  Diabetes Maternal Grandmother  Prostate cancer Maternal Grandfather    COPD Maternal Grandfather    Rectal cancer Neg Hx    Stomach cancer Neg Hx    Esophageal cancer Neg Hx    Pancreatic cancer Neg Hx     ROS: no fevers or chills, productive cough, hemoptysis, dysphasia, odynophagia, melena, hematochezia, dysuria, hematuria, rash, seizure activity, orthopnea, PND, pedal edema, claudication. Remaining systems are negative.  Physical Exam: Well-developed well-nourished in no acute distress.  Skin is warm and dry.  HEENT is normal.  Neck is supple.  Chest is clear to auscultation with normal expansion.  Cardiovascular exam is regular rate and rhythm.  Abdominal exam nontender or distended. No masses palpated. Extremities show no edema. neuro grossly intact  ECG-normal sinus rhythm at a rate of 88, no ST changes.  Personally reviewed  A/P  1 history of chest pain-patient has occasional atypical chest pain but previous CTA demonstrated minimal coronary disease.  Electrocardiogram showed no ST changes.  We will continue to follow for now.  2 hypertension-blood pressure controlled.  Continue present medical regimen.  3 hyperlipidemia-continue zetia; she has some myalgias on Crestor 40 mg daily and we will decrease to 20 mg daily.  Check lipids and liver in 8 weeks.  Minimal calcification noted on previous CTA.  4 palpitations-continue Toprol.  We discussed increasing Toprol but this made her fatigue previously.  We also discussed purchasing a smart watch to record any rhythm disturbances.  She will consider.  Kirk Ruths, MD

## 2022-05-22 ENCOUNTER — Encounter: Payer: Self-pay | Admitting: Cardiology

## 2022-05-25 ENCOUNTER — Other Ambulatory Visit (HOSPITAL_BASED_OUTPATIENT_CLINIC_OR_DEPARTMENT_OTHER): Payer: Self-pay

## 2022-05-27 ENCOUNTER — Ambulatory Visit (INDEPENDENT_AMBULATORY_CARE_PROVIDER_SITE_OTHER): Payer: 59 | Admitting: Cardiology

## 2022-05-27 ENCOUNTER — Encounter: Payer: Self-pay | Admitting: Cardiology

## 2022-05-27 VITALS — BP 108/76 | HR 88 | Ht 61.0 in | Wt 135.0 lb

## 2022-05-27 DIAGNOSIS — R002 Palpitations: Secondary | ICD-10-CM

## 2022-05-27 DIAGNOSIS — I10 Essential (primary) hypertension: Secondary | ICD-10-CM | POA: Diagnosis not present

## 2022-05-27 DIAGNOSIS — R931 Abnormal findings on diagnostic imaging of heart and coronary circulation: Secondary | ICD-10-CM

## 2022-05-27 DIAGNOSIS — R072 Precordial pain: Secondary | ICD-10-CM

## 2022-05-27 MED ORDER — ROSUVASTATIN CALCIUM 20 MG PO TABS: 20.0000 mg | ORAL_TABLET | Freq: Every day | ORAL | 3 refills | Status: DC

## 2022-05-27 NOTE — Patient Instructions (Signed)
Medication Instructions:   DECREASE ROSUVASTATIN TO 20 MG ONCE DAILY  *If you need a refill on your cardiac medications before your next appointment, please call your pharmacy*   Lab Work:  Your physician recommends that you return for lab work in: [place]  If you have labs (blood work) drawn today and your tests are completely normal, you will receive your results only by: [place] (if you have MyChart) OR A paper copy in the mail If you have any lab test that is abnormal or we need to change your treatment, we will call you to review the results.   Follow-Up: At [facility], you and your health needs are our priority.  As part of our continuing mission to provide you with exceptional heart care, we have created designated Provider Care Teams.  These Care Teams include your primary Cardiologist (physician) and Advanced Practice Providers (APPs -  Physician Assistants and Nurse Practitioners) who all work together to provide you with the care you need, when you need it.  We recommend signing up for the patient portal called "MyChart".  Sign up information is provided on this After Visit Summary.  MyChart is used to connect with patients for Virtual Visits (Telemedicine).  Patients are able to view lab/test results, encounter notes, upcoming appointments, etc.  Non-urgent messages can be sent to your provider as well.   To learn more about what you can do with MyChart, go to [email or web address].    Your next appointment:   12 month(s)  The format for your next appointment:   In Person  Provider:   Kirk Ruths, MD      Important Information About Sugar

## 2022-06-04 ENCOUNTER — Other Ambulatory Visit (HOSPITAL_BASED_OUTPATIENT_CLINIC_OR_DEPARTMENT_OTHER): Payer: Self-pay

## 2022-06-11 ENCOUNTER — Encounter: Payer: Self-pay | Admitting: Cardiology

## 2022-06-11 DIAGNOSIS — E785 Hyperlipidemia, unspecified: Secondary | ICD-10-CM

## 2022-06-15 NOTE — Addendum Note (Signed)
Addended by: Caprice Beaver T on: 06/15/2022 10:04 AM   Modules accepted: Orders

## 2022-06-19 ENCOUNTER — Other Ambulatory Visit (HOSPITAL_BASED_OUTPATIENT_CLINIC_OR_DEPARTMENT_OTHER): Payer: Self-pay

## 2022-06-19 DIAGNOSIS — U071 COVID-19: Secondary | ICD-10-CM | POA: Diagnosis not present

## 2022-06-22 ENCOUNTER — Other Ambulatory Visit (HOSPITAL_BASED_OUTPATIENT_CLINIC_OR_DEPARTMENT_OTHER): Payer: Self-pay

## 2022-06-22 MED ORDER — LOSARTAN POTASSIUM-HCTZ 50-12.5 MG PO TABS
1.0000 | ORAL_TABLET | Freq: Every day | ORAL | 0 refills | Status: DC
Start: 2022-06-22 — End: 2022-09-21
  Filled 2022-06-22: qty 90, 90d supply, fill #0

## 2022-07-09 ENCOUNTER — Ambulatory Visit: Payer: 59 | Attending: Cardiology | Admitting: Pharmacist Clinician (PhC)/ Clinical Pharmacy Specialist

## 2022-07-09 ENCOUNTER — Other Ambulatory Visit (HOSPITAL_BASED_OUTPATIENT_CLINIC_OR_DEPARTMENT_OTHER): Payer: Self-pay

## 2022-07-09 DIAGNOSIS — E785 Hyperlipidemia, unspecified: Secondary | ICD-10-CM

## 2022-07-09 MED ORDER — ROSUVASTATIN CALCIUM 10 MG PO TABS
10.0000 mg | ORAL_TABLET | Freq: Every day | ORAL | 3 refills | Status: DC
  Filled 2022-07-09: qty 90, 90d supply, fill #0

## 2022-07-09 NOTE — Patient Instructions (Signed)
Your Results:             Your most recent labs Goal  Total Cholesterol 182 < 200  Triglycerides 84 < 150  HDL (happy/good cholesterol) 56 > 40  LDL (lousy/bad cholesterol 111 < 100   Medication changes:  Restart rosuvastatin at 10 mg three times weekly for 2-3 weeks.  If you do well, increase to 10 mg daily.    Lab orders:  We want to repeat labs after 2-3 months.  We will send you a lab order to remind you once we get closer to that time.     Thank you for choosing [facility]

## 2022-07-09 NOTE — Progress Notes (Unsigned)
07/09/2022 Nicole Crosby 1966/02/07 [id number]   HPI:  Nicole Crosby is a 56 y.o. female patient of Dr Stanford Breed, who presents today for a lipid clinic evaluation.  See pertinent past medical history below.  Thought was doing okay on 10 crestor; was bumped to 40 mg - but caused memory issues, fatigue, flu like, myalgias in feet/ankles, dropped to 20 mg but symptoms did not resolve  Ezetimibe - after 3 days abdominal pain, diarrhea  Past Medical History:  0.7  - 70th percentile                   Current Medications: rosuvastatin 20 ezetimibe 10  Cholesterol Goals:   Intolerant/previously tried:  Family history: father had MI and stents at 26. Mother with AF   Diet: chicken, baked or grilled; oatmeal   Exercise:    Labs: TC 182, TG 84, HDL 56, LDL 111  (was 76 6 months ago)   Current Outpatient Medications  Medication Sig Dispense Refill   Calcium Carbonate-Vit D-Min (CALCIUM 1200 PO) Take 1,200 Units by mouth daily.     cholecalciferol (VITAMIN D) 1000 UNITS tablet Take 1,200 Units by mouth daily.     clindamycin (CLEOCIN T) 1 % lotion Apply to rash once daily as needed 60 mL 5   COVID-19 At Home Antigen Test (CARESTART COVID-19 HOME TEST) KIT Use as directed 4 kit 0   Crisaborole (EUCRISA) 2 % OINT Apply 1 application topically 2 (two) times daily. 60 g 1   diazepam (VALIUM) 5 MG tablet Take 1 tablet (5 mg total) by mouth every 8 (eight) hours as needed (vertigo). 8 tablet 0   ezetimibe (ZETIA) 10 MG tablet Take 1 tablet (10 mg total) by mouth daily. 90 tablet 3   famotidine (PEPCID) 20 MG tablet TAKE 1 TABLET BY MOUTH TWICE DAILY 60 tablet 11   hydrocortisone 2.5 % ointment APPLY TWICE A DAY FOR 2 WEEKS 28.35 g 0   influenza vac split quadrivalent PF (FLUARIX) 0.5 ML injection Inject into the muscle. 0.5 mL 0   losartan-hydrochlorothiazide (HYZAAR) 50-12.5 MG tablet TAKE 1 TABLET BY MOUTH ONCE DAILY 90 tablet 0   Magnesium 200 MG TABS      meclizine  (ANTIVERT) 25 MG tablet Take 25 mg by mouth 2 (two) times daily as needed for dizziness.     melatonin 3 MG TABS tablet Take 3 mg by mouth at bedtime as needed.     methocarbamol (ROBAXIN) 500 MG tablet Take 500 mg by mouth every 6 (six) hours as needed.     metoprolol succinate (TOPROL XL) 25 MG 24 hr tablet Take 1/2 tablet (12.5 mg total) by mouth at bedtime. 45 tablet 3   metroNIDAZOLE (METROCREAM) 0.75 % cream      ondansetron (ZOFRAN ODT) 4 MG disintegrating tablet Take 1 tablet (4 mg total) by mouth every 8 (eight) hours as needed for nausea or vomiting. 20 tablet 0   OSCIMIN 0.125 MG SUBL PLACE 1 TABLET (0.125 MG TOTAL) UNDER THE TONGUE EVERY 4 (FOUR) HOURS AS NEEDED. 60 tablet 0   Potassium Chloride ER 20 MEQ TBCR Take 1 tablet by mouth 2 times daily with food 180 tablet 2   potassium chloride SA (KLOR-CON M) 20 MEQ tablet Take 1 tablet (20 mEq total) by mouth 2 (two) times daily. 180 tablet 2   rosuvastatin (CRESTOR) 20 MG tablet Take 1 tablet (20 mg total) by mouth daily. 90 tablet 3   vitamin B-12 (CYANOCOBALAMIN) 500  MCG tablet Take 500 mcg by mouth daily.     Zinc 50 MG TABS      Zoster Vaccine Adjuvanted [facility]) injection Inject into the muscle. 1 each 1   Current Facility-Administered Medications  Medication Dose Route Frequency Provider Last Rate Last Admin   0.9 %  sodium chloride infusion  500 mL Intravenous Once Gatha Mayer, MD        Allergies  Allergen Reactions   Buspirone Other (See Comments)     nightmare   Esomeprazole Other (See Comments)    headache   Lansoprazole     Abdominal pain   Omeprazole     Headache   Proton Pump Inhibitors    Sulfonamide Derivatives Rash     Rash    Past Medical History:  Diagnosis Date   Allergy    Buspirone, Sulfa, Pantoprazole   Benign fundic gland polyps of stomach 02/12/2016   Chicken pox    Dizziness    Eczema    Functional dyspepsia    GERD (gastroesophageal reflux disease)    H/O adenomatous polyp of  colon 10/04/2014   09/2014 - 3 mm adenoma - repeat colonoscopy 2022   Hyperlipidemia    no meds   Hypertension    pt denies   IBS (irritable bowel syndrome)    Insomnia    MEDIAL EPICONDYLITIS, RIGHT 07/17/2010   Nonulcer dyspepsia 02/12/2016   Other abnormal glucose    Tiny hypodensity left hepatic lobe-CT of abdomen and pelvis 3/96/8864-GEFUW 28m periumblical hernia   PAC (premature atrial contraction)    Palpitations     Last menstrual period 11/15/2018.   No problem-specific Assessment & Plan notes found for this encounter.   KTommy MedalPharmD CPP [id number] Fair[address] StreetSLime LakeGCentennial Ma[place] [phone]

## 2022-07-13 ENCOUNTER — Encounter: Payer: Self-pay | Admitting: Pharmacist Clinician (PhC)/ Clinical Pharmacy Specialist

## 2022-07-13 NOTE — Assessment & Plan Note (Signed)
Patient with hyperlipidemia and minimal CAD on coronary calcium screen.  Reviewed insurance requirements that patients try/fail 2 different statins before looking at PCSK-9, bempedoic acid or inclisiran.  Patient would like to re-challenge with lower dose rosuvastatin again.  Will start with 10 mg three times weekly then increase to once daily after 2-3 weeks if no symptoms appear.  She will need to repeat cholesterol labs in 3 months.

## 2022-07-16 ENCOUNTER — Encounter: Payer: Self-pay | Admitting: Neurology

## 2022-07-20 ENCOUNTER — Other Ambulatory Visit (HOSPITAL_BASED_OUTPATIENT_CLINIC_OR_DEPARTMENT_OTHER): Payer: Self-pay

## 2022-07-24 ENCOUNTER — Other Ambulatory Visit (HOSPITAL_BASED_OUTPATIENT_CLINIC_OR_DEPARTMENT_OTHER): Payer: Self-pay

## 2022-07-24 MED ORDER — FLUARIX QUADRIVALENT 0.5 ML IM SUSY
PREFILLED_SYRINGE | INTRAMUSCULAR | 0 refills | Status: DC
Start: 2022-07-24 — End: 2022-08-09
  Filled 2022-07-24: qty 0.5, 1d supply, fill #0

## 2022-08-06 ENCOUNTER — Ambulatory Visit (INDEPENDENT_AMBULATORY_CARE_PROVIDER_SITE_OTHER): Payer: 59 | Admitting: Neurology

## 2022-08-06 ENCOUNTER — Encounter: Payer: Self-pay | Admitting: Neurology

## 2022-08-06 VITALS — BP 117/62 | HR 77 | Ht 61.0 in | Wt 135.0 lb

## 2022-08-06 DIAGNOSIS — G451 Carotid artery syndrome (hemispheric): Secondary | ICD-10-CM | POA: Diagnosis not present

## 2022-08-06 DIAGNOSIS — M5432 Sciatica, left side: Secondary | ICD-10-CM | POA: Diagnosis not present

## 2022-08-06 DIAGNOSIS — M898X1 Other specified disorders of bone, shoulder: Secondary | ICD-10-CM | POA: Diagnosis not present

## 2022-08-06 DIAGNOSIS — G450 Vertebro-basilar artery syndrome: Secondary | ICD-10-CM | POA: Diagnosis not present

## 2022-08-06 DIAGNOSIS — R55 Syncope and collapse: Secondary | ICD-10-CM | POA: Diagnosis not present

## 2022-08-06 NOTE — Patient Instructions (Addendum)
CTA of the head and neck for carotid or vertebral insufficiency causing presyncope with movement of neck in particular neck extension.   Physical therapy for shoulder blade and left leg and low back symptoms  If PT does not help can come back in 6 months as needed or can schedule a 6 month appointment today

## 2022-08-06 NOTE — Progress Notes (Signed)
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 Thin cuts trough the IAC did not reveal any etiology for her dizziness. No strokes seen, MRI brain largely normal.That is what can be causing her   Today she is here for something new. For several months looks up she almost passes out, lightheaded with head extension and with neck turning, she moves her neck back into neutral position and feels better, feels like she is going to vomit. Vision goes dark, feels like she is going to pass out, pre-syncopal. She is not having any abnormal lip movements today and no hx of anti-dopamine blocking agents so unlikely TD or other movement disorder may consick a tic or habit from wearing a mask. Numbness under her left shoulder blade when she walks it goes numb. (She has had it for years). Getting worse. Now the other shoulder blade, only with particular movements, recommend PT and if that fails would image the t-spine. MRI c-spine in 2020 did not show any etioligy at that level. Also possible sciatica left leg, can also try to have PT address it and if still continues with numbness and tingling down the left leg we can order MR Lumbar spine.   11/17/2021: Patient seen in the past, here for new issue. She had covid n June and felt "off' and dizziness, she feels she has cognitive issues since covid, random, happens sometimes forget maybe what to do next, she may forget some names. The name will eventually come to her. The dizziness is better but she still has it, no room spinning but something doesn't feel "normal in my head", she has ringing in the left ear, hearing loss right ear, no weakness, no numbness, she has some neck pain and  had PT and sports medicine, cervicogenic dizziness or cervicalgia. She has left eye pressure and changes in vision left eye. Hgba1c 5.7 11/21/2020, TSH normal 11/21/2020. MRI Trigeminal nerve negative (05/29/2020)   MRI brain :  ADDENDUM: Patient returns for additional sequences through the posterior fossa. No abnormal enhancement along the course of the trigeminal nerves and included divisions. Inner ear structures demonstrate an unremarkable MR appearance. 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     Electronically Signed   By: Macy Mis M.D.   On: 12/02/2021 17:11    Addended by Macy Mis, MD on 12/02/2021  5:13 PM   Study Result  Narrative & Impression  CLINICAL DATA:  Headache, cluster/trigeminal MS protocol   EXAM: MRI HEAD WITHOUT AND WITH CONTRAST   TECHNIQUE: Multiplanar, multiecho pulse sequences of the brain and surrounding structures were obtained without and with intravenous contrast.   CONTRAST:  82m GADAVIST GADOBUTROL 1 MMOL/ML IV SOLN   COMPARISON:  08/22/2019   FINDINGS: Brain: There is no acute infarction or intracranial hemorrhage. There is no intracranial mass, mass effect, or edema. There is no hydrocephalus or extra-axial fluid collection. Ventricles and sulci are normal in size and configuration. Minimal punctate foci of T2 hyperintensity in the supratentorial white matter likely reflecting nonspecific gliosis/demyelination. No abnormal enhancement.   Vascular: Major vessel flow voids at the skull base are preserved.   Skull and upper cervical spine: Normal marrow signal is preserved.  Sinuses/Orbits: Minor mucosal thickening.  Orbits are unremarkable.   Other: Sella is unremarkable.  Mastoid air cells are clear.   IMPRESSION: No intracranial mass or abnormal enhancement.   Minimal foci of probable nonspecific gliosis/demyelination in the cerebral white matter,  which may not be of clinical significance.   Imaging through the posterior fossa was not performed as requested. If desired, patient may return for this imaging at no additional cost.   MRI c-spine: 2020: C3-4: Negative.   C4-5: Minimal disc bulging, stable to minimally increased. No stenosis.   C5-6: Slight enlargement of a small central disc protrusion without stenosis.   C6-7: Small central disc protrusion and new mild disc bulging result in new mild right neural foraminal stenosis and borderline spinal stenosis.   C7-T1: Negative.   IMPRESSION: 1. Mildly progressive cervical spondylosis most notable at C6-7 where there is new mild right neural foraminal stenosis. 2. No significant spinal stenosis.  HPI 05/22/20:  GLENDON FISER is a 57 y.o. female here as requested by Dr. Vertell Limber for cervicalgia.  She was evaluated in neurology by Dr. Thresa Ross in the past and she was last seen November 2020 there for vertigo and neck pain, reviewed notes: Patient initially seen for vertigo, feeling like she is on a boat, no associated headaches nausea vomiting, meclizine seemed to help, not positional in quality, 3-4 times a week in February 2020, she reported the time sensations of disequilibrium when walking around 5 years prior and had vestibular therapy, she also reported memory changes since a fall of the prior year, reported memory changes since 2015 with normal MRI of the brain in 2015.  She was found to have low B12 levels in 2020, she was on injections, B12 at that time was 402 per Dr. Posey Pronto, and paresthesias improved on that.  States that patient still has pain and hears a crack when she turns her head, dizziness with head movements is better, she also reported left-sided numbness and paresthesias, left side numbness in the jaw, and numbness spread to the cheek and eye on 10/15, she went to urgent care and was felt to have inflammation of the cranial nerve and given a course of steroids, no  improvement in symptoms, back to the ER when it involved her left arm and left leg and had an MRI of the brain with and without contrast with no acute changes seen and limb symptoms resolved when she last saw Dr. Delice Lesch but she continued to feel numbness in her face bilateral.  She did state at last appointment she continued to report a lot of neck pain, she tried tizanidine in the past, MRI of the cervical spine from 2013 reported mild cervical spondylosis and 1 central disc protrusion at C6-C7 without resulting stenosis.  She also reported memory loss to Dr. Delice Lesch, she had headaches in the past which stopped when she discontinued her reflux medication.  Patient is here alone complaining of left side of her face feels numb from the ear down the jawline and the eye. No shooting pain. Started in October, no inciting event, no trauma, she was at work and noticed it acutely, then it started going down the left arm and left leg then went to the ED. The left arm and leg resolved. She saw Dr. Delice Lesch and Dr. Amparo Bristol notes stated it was bilateral however patient does not recall this, The numbness waxes and wanes and by lunch time she can feel numbness in the left side of the face. Prednisone  didn't help, she did a dose pak and it didn't help.No other focal neurologic deficits, associated symptoms, inciting events or modifiable factors.  Reviewed notes, labs and imaging from outside physicians, which showed:  MRI cervical spine 09/2019: reviewed images and agree with following C2-3: Negative.   C3-4: Negative.   C4-5: Minimal disc bulging, stable to minimally increased. No stenosis.   C5-6: Slight enlargement of a small central disc protrusion without stenosis.   C6-7: Small central disc protrusion and new mild disc bulging result in new mild right neural foraminal stenosis and borderline spinal stenosis.   C7-T1: Negative.   IMPRESSION: 1. Mildly progressive cervical spondylosis most notable at  C6-7 where there is new mild right neural foraminal stenosis. 2. No significant spinal stenosis.  Review of Systems: Patient complains of symptoms per HPI as well as the following symptoms: eye pain . Pertinent negatives and positives per HPI. All others negative    Social History   Socioeconomic History   Marital status: Married    Spouse name: Not on file   Number of children: 2   Years of education: Not on file   Highest education level: Not on file  Occupational History   Occupation: Xray/CT Engineer, production: [employer]  Tobacco Use   Smoking status: Never   Smokeless tobacco: Never  Vaping Use   Vaping Use: Never used  Substance and Sexual Activity   Alcohol use: Yes    Alcohol/week: 1.0 standard drink of alcohol    Types: 1 Glasses of wine per week    Comment: occasional   Drug use: No   Sexual activity: Yes    Birth control/protection: Inserts  Other Topics Concern   Not on file  Social History Narrative   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    Alcohol Use - yes-wine         Full Time    Married           Social Determinants of Health   Financial Resource Strain: Not on file  Food Insecurity: Not on file  Transportation Needs: Not on file  Physical Activity: Not on file  Stress: Not on file  Social Connections: Not on file  Intimate Partner Violence: Not on file    Family History  Problem Relation Age of Onset   Hypertension Mother    Hyperlipidemia Mother    Fibrocystic breast disease Mother    Hearing loss Mother    Osteopenia Mother    Dementia Mother    Prostate cancer Father 50       deceased   Hypertension Father    Diabetes Father    Kidney disease Father    Heart disease Father        MI   Hyperlipidemia Father    Thyroid disease Sister    Diabetes Sister        pre-diabetes   Hypertension Sister    Arthritis Sister    Diabetes Sister    Hypertension Sister    Hyperlipidemia Sister    Fibrocystic  breast disease Sister    Neuropathy Sister    Hyperlipidemia Sister    Hypertension Sister    Other Brother        unknown   Colon cancer Maternal Uncle        died in late 75's   Diabetes Maternal Grandmother    Prostate cancer Maternal Grandfather    COPD Maternal Grandfather  Rectal cancer Neg Hx    Stomach cancer Neg Hx    Esophageal cancer Neg Hx    Pancreatic cancer Neg Hx     Past Medical History:  Diagnosis Date   Allergy    Buspirone, Sulfa, Pantoprazole   Benign fundic gland polyps of stomach 02/12/2016   Chicken pox    Dizziness    Eczema    Functional dyspepsia    GERD (gastroesophageal reflux disease)    H/O adenomatous polyp of colon 10/04/2014   09/2014 - 3 mm adenoma - repeat colonoscopy 2022   Hyperlipidemia    no meds   Hypertension    pt denies   IBS (irritable bowel syndrome)    Insomnia    MEDIAL EPICONDYLITIS, RIGHT 07/17/2010   Nonulcer dyspepsia 02/12/2016   Other abnormal glucose    Tiny hypodensity left hepatic lobe-CT of abdomen and pelvis 3/55/7322-GURKY 56m periumblical hernia   PAC (premature atrial contraction)    Palpitations     Patient Active Problem List   Diagnosis Date Noted   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   Nausea and vomiting 12/25/2019   Abdominal pain 12/25/2019   Constipation 12/25/2019   Change in bowel habit 12/25/2019   Hypokalemia 09/20/2019   Flexural eczema 05/17/2018   Eyelid dermatitis, allergic/contact 05/17/2018   Medial epicondylitis, right 03/17/2018   Ulnar nerve entrapment at elbow, 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   Vitamin D deficiency 11/03/2017   Shortened PR interval 09/27/2017   Greater trochanteric bursitis of left hip 05/12/2017   B12 deficiency 01/22/2017   Memory changes 08/28/2016   Nonulcer dyspepsia 02/12/2016   Benign fundic gland polyps of stomach 02/12/2016   Paresthesia 12/17/2015   Bladder spasm 12/13/2014   H/O adenomatous polyp of colon 10/04/2014   Screening for malignant neoplasm of  cervix 05/31/2013   Physical exam 05/31/2013   Left L3 lumbar radiculitis 09/23/2012   Neck pain 08/22/2012   [place]   Essential hypertension, benign 03/02/2012   IBS (irritable bowel syndrome)    PAC (premature atrial contraction)    Insomnia    GERD (gastroesophageal reflux disease)    DYSTHYMIA 09/30/2009   HEADACHE 09/21/2008   HEPATIC CYST 08/14/2008   Hyperlipidemia 08/06/2008    Past Surgical History:  Procedure Laterality Date   CESAREAN SECTION  01/2001   COLONOSCOPY     DE QUERVAIN'S RELEASE  2000   Right wrist area   ESOPHAGOGASTRODUODENOSCOPY      Current Outpatient Medications  Medication Sig Dispense Refill   Calcium Carbonate-Vit D-Min (CALCIUM 1200 PO) Take 1,200 Units by mouth daily.     cholecalciferol (VITAMIN D) 1000 UNITS tablet Take 1,200 Units by mouth daily.     diazepam (VALIUM) 5 MG tablet Take 1 tablet (5 mg total) by mouth every 8 (eight) hours as needed (vertigo). 8 tablet 0   famotidine (PEPCID) 20 MG tablet TAKE 1 TABLET BY MOUTH TWICE DAILY 60 tablet 11   hydrocortisone 2.5 % ointment APPLY TWICE A DAY FOR 2 WEEKS 28.35 g 0   influenza vac split quadrivalent PF (FLUARIX) 0.5 ML injection Inject into the muscle. 0.5 mL 0   losartan-hydrochlorothiazide (HYZAAR) 50-12.5 MG tablet TAKE 1 TABLET BY MOUTH ONCE DAILY 90 tablet 0   meclizine (ANTIVERT) 25 MG tablet Take 25 mg by mouth 2 (two) times daily as needed for dizziness.     melatonin 3 MG TABS tablet Take 3 mg by mouth at bedtime as needed.     methocarbamol (ROBAXIN) 500 MG  tablet Take 500 mg by mouth every 6 (six) hours as needed.     metoprolol succinate (TOPROL XL) 25 MG 24 hr tablet Take 1/2 tablet (12.5 mg total) by mouth at bedtime. 45 tablet 3   metroNIDAZOLE (METROCREAM) 0.75 % cream      ondansetron (ZOFRAN ODT) 4 MG disintegrating tablet Take 1 tablet (4 mg total) by mouth every 8 (eight) hours as needed for nausea or vomiting. 20 tablet 0   OSCIMIN 0.125 MG  SUBL PLACE 1 TABLET (0.125 MG TOTAL) UNDER THE TONGUE EVERY 4 (FOUR) HOURS AS NEEDED. 60 tablet 0   Potassium Chloride ER 20 MEQ TBCR Take 1 tablet by mouth 2 times daily with food 180 tablet 2   potassium chloride SA (KLOR-CON M) 20 MEQ tablet Take 1 tablet (20 mEq total) by mouth 2 (two) times daily. 180 tablet 2   rosuvastatin (CRESTOR) 10 MG tablet Take 1 tablet (10 mg total) by mouth daily. 90 tablet 3   vitamin B-12 (CYANOCOBALAMIN) 500 MCG tablet Take 500 mcg by mouth daily.     Current Facility-Administered Medications  Medication Dose Route Frequency Provider Last Rate Last Admin   0.9 %  sodium chloride infusion  500 mL Intravenous Once Gatha Mayer, MD        Allergies as of 08/06/2022 - Review Complete 08/06/2022  Allergen Reaction Noted   Buspirone Other (See Comments) 08/24/2012   Esomeprazole Other (See Comments) 07/09/2016   Lansoprazole  12/16/2015   Omeprazole  12/16/2015   Proton pump inhibitors  05/22/2020   Sulfonamide derivatives Rash 11/21/2009    Vitals: BP 117/62   Pulse 77   Ht '5\' 1"'$  (1.549 m)   Wt 135 lb (61.2 kg)   LMP 11/15/2018   BMI 25.51 kg/m  Last Weight:  Wt Readings from Last 1 Encounters:  08/06/22 135 lb (61.2 kg)   Last Height:   Ht Readings from Last 1 Encounters:  08/06/22 '5\' 1"'$  (1.549 m)  Exam: NAD, pleasant                  Speech:    Speech is normal; fluent and spontaneous with normal comprehension.  Cognition:    The patient is oriented to person, place, and time;     recent and remote memory intact;     language fluent;    Cranial Nerves:    The pupils are equal, round, and reactive to light.Trigeminal sensation is intact and the muscles of mastication are normal. The face is symmetric. The palate elevates in the midline. Hearing intact. Voice is normal. Shoulder shrug is normal. The tongue has normal motion without fasciculations.   Coordination:  No dysmetria  Motor Observation:    No asymmetry, no atrophy, and no  involuntary movements noted. Tone:    Normal muscle tone.     Strength:    Strength is V/V in the upper and lower limbs.      Sensation: intact to LT  Reflexes: normal    Assessment/Plan: I saw patient in the past multiple times for various symptoms last for left facial numbness and multiple other symptoms today here for new concerns including left numbness under the shoulder blade, left numbness and tingling of the left leg, pre-syncope with head turns and head extension Last appointment was here  for new issues feeling cognitive changes since COVID, some confusion, dizziness, not feeling "normal in the head", ringing in the left ear, hearing loss right ear, right temporal headache, left eye  pressure and vision changes.   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 Thin cuts trough the IAC did not reveal any etiology for her dizziness. No strokes seen, MRI brain largely normal.That is what can be causing her left facial numbness, she does not want to address it.  - Pre-syncope: possible artery insufficiency: Today she is here for something new again: For several months looks up she almost passes out, lightheaded with head extension and with neck turning, she moves her neck back into neutral position and feels better, feels like she is going to vomit. Vision goes dark, feels like she is going to pass out, pre-syncopal. CTA of the head and neck for carotid or vertebral insufficiency causing presyncope with movement of neck in particular neck extension.   - Physical therapy for shoulder blade and left leg and low back symptoms. [facility].   - If PT does not help can come back in 6 months as needed or can schedule a 6 month appointment today  Abnormal lip movement: She is not having any abnormal lip movements today and no hx of anti-dopamine blocking agents so unlikely TD or other movement  disorder may consick a tic or habit from wearing a mask.   - Numbness under her left shoulder blade when she walks it goes numb and MRI c-spine in 2020 did not show any etiology.  Getting worse. Now the other shoulder blade, only with particular movements, recommend PT and if that fails would image the t-spine.  - Also possible sciatica left leg, can also try to have PT address it and if still continues with numbness and tingling down the left leg we can order MR Lumbar spine.   - Musculoskeletal neckpain: conservative treatment, dry needling  Orders Placed This Encounter  Procedures   CT ANGIO NECK W OR WO CONTRAST   CT ANGIO HEAD W OR WO CONTRAST   Ambulatory referral to Physical Therapy     Cc:   Kathyrn Lass, MD  Sarina Ill, MD  [facility] [address] [place], [place] [address]  Phone [phone] Fax [phone]  I spent over 40 minutes of face-to-face and non-face-to-face time with patient on the  1. Pre-syncope   2. Vertebrobasilar artery insufficiency   3. Carotid insufficiency   4. Shoulder blade pain   5. Sciatica of left side     diagnosis.  This included previsit chart review, lab review, study review, order entry, electronic health record documentation, patient education on the different diagnostic and therapeutic options, counseling and coordination of care, risks and benefits of management, compliance, or risk factor reduction

## 2022-08-10 ENCOUNTER — Telehealth: Payer: Self-pay | Admitting: Neurology

## 2022-08-10 ENCOUNTER — Encounter: Payer: Self-pay | Admitting: Pharmacist Clinician (PhC)/ Clinical Pharmacy Specialist

## 2022-08-10 NOTE — Telephone Encounter (Signed)
[facility] NPR sent to GI

## 2022-08-12 ENCOUNTER — Ambulatory Visit (INDEPENDENT_AMBULATORY_CARE_PROVIDER_SITE_OTHER): Payer: 59

## 2022-08-12 DIAGNOSIS — R55 Syncope and collapse: Secondary | ICD-10-CM

## 2022-08-12 DIAGNOSIS — G451 Carotid artery syndrome (hemispheric): Secondary | ICD-10-CM | POA: Diagnosis not present

## 2022-08-12 DIAGNOSIS — G450 Vertebro-basilar artery syndrome: Secondary | ICD-10-CM | POA: Diagnosis not present

## 2022-08-12 DIAGNOSIS — I6521 Occlusion and stenosis of right carotid artery: Secondary | ICD-10-CM | POA: Diagnosis not present

## 2022-08-12 MED ORDER — IOHEXOL 350 MG/ML SOLN
75.0000 mL | Freq: Once | INTRAVENOUS | Status: AC | PRN
  Administered 2022-08-12: 75 mL via INTRAVENOUS

## 2022-08-25 ENCOUNTER — Other Ambulatory Visit: Payer: Self-pay | Admitting: Family Medicine

## 2022-08-25 DIAGNOSIS — Z85828 Personal history of other malignant neoplasm of skin: Secondary | ICD-10-CM | POA: Diagnosis not present

## 2022-08-25 DIAGNOSIS — Z8262 Family history of osteoporosis: Secondary | ICD-10-CM | POA: Diagnosis not present

## 2022-08-25 DIAGNOSIS — I700 Atherosclerosis of aorta: Secondary | ICD-10-CM | POA: Diagnosis not present

## 2022-08-25 DIAGNOSIS — M791 Myalgia, unspecified site: Secondary | ICD-10-CM | POA: Diagnosis not present

## 2022-08-25 DIAGNOSIS — Z0000 Encounter for general adult medical examination without abnormal findings: Secondary | ICD-10-CM | POA: Diagnosis not present

## 2022-08-25 DIAGNOSIS — R7303 Prediabetes: Secondary | ICD-10-CM | POA: Diagnosis not present

## 2022-08-25 DIAGNOSIS — Z6826 Body mass index (BMI) 26.0-26.9, adult: Secondary | ICD-10-CM | POA: Diagnosis not present

## 2022-08-25 DIAGNOSIS — I10 Essential (primary) hypertension: Secondary | ICD-10-CM | POA: Diagnosis not present

## 2022-08-26 ENCOUNTER — Encounter: Payer: Self-pay | Admitting: Physical Therapy

## 2022-08-26 ENCOUNTER — Ambulatory Visit: Payer: 59 | Attending: Neurology | Admitting: Physical Therapy

## 2022-08-26 DIAGNOSIS — M5459 Other low back pain: Secondary | ICD-10-CM | POA: Insufficient documentation

## 2022-08-26 DIAGNOSIS — M546 Pain in thoracic spine: Secondary | ICD-10-CM | POA: Diagnosis not present

## 2022-08-26 DIAGNOSIS — R252 Cramp and spasm: Secondary | ICD-10-CM | POA: Insufficient documentation

## 2022-08-26 DIAGNOSIS — M898X1 Other specified disorders of bone, shoulder: Secondary | ICD-10-CM | POA: Diagnosis not present

## 2022-08-26 DIAGNOSIS — M5432 Sciatica, left side: Secondary | ICD-10-CM | POA: Insufficient documentation

## 2022-08-26 NOTE — Therapy (Signed)
OUTPATIENT PHYSICAL THERAPY THORACOLUMBAR EVALUATION   Patient Name: Nicole Crosby MRN: [id number] DOB:05-Jan-1966, 56 y.o., female Today's Date: 08/26/2022   PT End of Session - 08/26/22 1544     Visit Number 1    Number of Visits 12    Date for PT Re-Evaluation 10/07/22    Authorization Type [facility]    PT Start Time 2703    PT Stop Time 5009    PT Time Calculation (min) 42 min    Activity Tolerance Patient tolerated treatment well    Behavior During Therapy WFL for tasks assessed/performed             Past Medical History:  Diagnosis Date   Allergy    Buspirone, Sulfa, Pantoprazole   Benign fundic gland polyps of stomach 02/12/2016   Chicken pox    Dizziness    Eczema    Functional dyspepsia    GERD (gastroesophageal reflux disease)    H/O adenomatous polyp of colon 10/04/2014   09/2014 - 3 mm adenoma - repeat colonoscopy 2022   Hyperlipidemia    no meds   Hypertension    pt denies   IBS (irritable bowel syndrome)    Insomnia    MEDIAL EPICONDYLITIS, RIGHT 07/17/2010   Nonulcer dyspepsia 02/12/2016   Other abnormal glucose    Tiny hypodensity left hepatic lobe-CT of abdomen and pelvis 3/81/8299-BZJIR 61m periumblical hernia   PAC (premature atrial contraction)    Palpitations    Past Surgical History:  Procedure Laterality Date   CESAREAN SECTION  01/2001   COLONOSCOPY     DE QUERVAIN'S RELEASE  2000   Right wrist area   ESOPHAGOGASTRODUODENOSCOPY     Patient Active Problem List   Diagnosis Date Noted   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   Nausea and vomiting 12/25/2019   Abdominal pain 12/25/2019   Constipation 12/25/2019   Change in bowel habit 12/25/2019   Hypokalemia 09/20/2019   Flexural eczema 05/17/2018   Eyelid dermatitis, allergic/contact 05/17/2018   Medial epicondylitis, right 03/17/2018   Ulnar nerve entrapment at elbow, 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   Vitamin D deficiency 11/03/2017   Shortened PR interval 09/27/2017   Greater  trochanteric bursitis of left hip 05/12/2017   B12 deficiency 01/22/2017   Memory changes 08/28/2016   Nonulcer dyspepsia 02/12/2016   Benign fundic gland polyps of stomach 02/12/2016   Paresthesia 12/17/2015   Bladder spasm 12/13/2014   H/O adenomatous polyp of colon 10/04/2014   Screening for malignant neoplasm of cervix 05/31/2013   Physical exam 05/31/2013   Left L3 lumbar radiculitis 09/23/2012   Neck pain 08/22/2012   [place]   Essential hypertension, benign 03/02/2012   IBS (irritable bowel syndrome)    PAC (premature atrial contraction)    Insomnia    GERD (gastroesophageal reflux disease)    DYSTHYMIA 09/30/2009   HEADACHE 09/21/2008   HEPATIC CYST 08/14/2008   Hyperlipidemia 08/06/2008    PCP: MKathyrn Lass MD   REFERRING PROVIDER: AMelvenia Beam MD  REFERRING DIAG:  [phone](ICD-10-CM) - Shoulder blade pain  M54.32 (ICD-10-CM) - Sciatica of left side    Rationale for Evaluation and Treatment Rehabilitation  THERAPY DIAG:  Pain in thoracic spine  Other low back pain  Cramp and spasm  ONSET DATE: chronic several years  SUBJECTIVE:  SUBJECTIVE STATEMENT: Patient reports she has had L shoulder blade numbness intermittently throughout the years, standing and doing laundry or washing dishes seems to cause it.  It gets numb and tingling along the medial side and bottom.  Notices it also with walking- was walking 15 min during lunch.  She has had sciatica for years, that hasn't changed.  The tailbone is new, notices sitting and starting to stand up.  She was on a statin and having a lot of neck pain, they decreased the dosage and since then the neck pain has gotten better.    PERTINENT HISTORY:  Chronic back/sciatica, c-section  PAIN:  Are you having  pain? Yes: NPRS scale: 4/10 Pain location: tailbone Pain description: aches  Aggravating factors: sitting down, getting up Relieving factors: haven't tried anything.   Are you having pain? Yes: NPRS scale: o/10 Pain location: L shoulder blade Pain description: numb, tingling, uncomfortable Aggravating factors: prolonged standing, walking  Relieving factors: sitting down   PRECAUTIONS: None  WEIGHT BEARING RESTRICTIONS: No  FALLS:  Has patient fallen in last 6 months? No  LIVING ENVIRONMENT: Lives with: lives with their family Lives in: House/apartment Stairs: Yes: External: 2 steps; none Has following equipment at home: None  OCCUPATION: Imaging tech  PLOF: Independent, 15 min walking/15 min bike daily  PATIENT GOALS: make symptoms better    OBJECTIVE:   DIAGNOSTIC FINDINGS:  No relevant imaging  PATIENT SURVEYS:  Quick Dash 6.8%  COGNITION: Overall cognitive status: Within functional limits for tasks assessed     SENSATION: WFL  MUSCLE LENGTH: Hamstrings: Right 90 deg; Left 90 deg   POSTURE: No Significant postural limitations  PALPATION: Tenderness over bil SIJ, bil piriformis and glut med.  Tenderness L Thoracic paraspinals, Rhomboids   LUMBAR ROM:   AROM eval  Flexion To ankles  Extension WNL, *   Right lateral flexion To knee, tight  Left lateral flexion To knee, tight  Right rotation WNL *  Left rotation WNL *    (Blank rows = not tested) * pain   LOWER EXTREMITY ROM:   good hip ROM bil, symmetric but increased pain at end range with external rotation   LOWER EXTREMITY MMT:     MMT Right eval Left eval  Hip flexion 5 5  Hip extension 5 5  Hip abduction 5 5  Hip adduction 5 5  Knee flexion 5 5  Knee extension 5 5  Ankle dorsiflexion 5 5  Ankle plantarflexion 5 5   (Blank rows = not tested)  UPPER EXTREMITY ROM:  full ROM, symmetric and pain free  LUMBAR SPECIAL TESTS:  Straight leg raise test: Negative, SI  Compression/distraction test: Positive, and FABER test: Positive  FUNCTIONAL TESTS:  5 times sit to stand: 11.75 seconds without UE assist, no pain.   GAIT: Distance walked: 20' Assistive device utilized: None Level of assistance: Complete Independence Comments: no significant deviation or device  TODAY'S TREATMENT:  DATE:   08/26/2022 - see patient education     PATIENT EDUCATION:  Education details: plan of care, findings, limits of imaging, education on dry needling including demonstration with anatomical model, SI exercise for posterior preference  Person educated: Patient Education method: Explanation, Demonstration, Verbal cues, and Handouts Education comprehension: verbalized understanding and returned demonstration  HOME EXERCISE PROGRAM: TBA  ASSESSMENT:  CLINICAL IMPRESSION: Nicole Crosby  is a 56 y.o. female who was seen today for physical therapy evaluation and treatment for L shoulder blade pain and L sciatica.  She reports numbness and tingling in L periscapular region rather than pain, noted trigger points in L thoracic multifidi and rhomboids, but otherwise no significant findings.  She reports no numbness or tingling in LLE, primarily just tailbone pain with transitional movements.  With testing today noted increased pain with SI compression, distraction, FABER, and pain over bil SIJ and piriformis.  She did not have pain in her lumbar spine with PA mobs, suggesting SIJ dysfunction.  She was given repeated step and bend for posterior SIJ preference, as this movement did not cause any pain.  ERYCA BOLTE would benefit from skilled physical therapy to decrease pain in pelvis and shoulder blade.     OBJECTIVE IMPAIRMENTS: increased muscle spasms, impaired sensation, and pain.   ACTIVITY LIMITATIONS: standing, transfers, and locomotion  level  PARTICIPATION LIMITATIONS: cleaning, laundry, and community activity  PERSONAL FACTORS: Time since onset of injury/illness/exacerbation and 1 comorbidity: chronic LBP  are also affecting patient's functional outcome.   REHAB POTENTIAL: Excellent  CLINICAL DECISION MAKING: Stable/uncomplicated  EVALUATION COMPLEXITY: Low   GOALS: Goals reviewed with patient? Yes  SHORT TERM GOALS: Target date: 09/09/2022   Patient will be independent with initial HEP.  Baseline: needs Goal status: INITIAL    LONG TERM GOALS: Target date: 10/07/2022    Patient will be independent with advanced/ongoing HEP to improve outcomes and carryover.  Baseline: needs progression  Goal status: INITIAL  2.  Patient will report 75% improvement in low back/SIJ/coccygeal pain to improve QOL.  Baseline:  Goal status: INITIAL  3.  Patient will demonstrate full pain free lumbar ROM to perform ADLs.   Baseline: see objective Goal status: INITIAL  4.  Patient will report 75% improvement in numbness/tingling in L shoulder blade with activity Baseline: increases with prolonged standing/walking Goal status: INITIAL  5.  Patient will tolerate 15 min of walking to exercise without pain. Baseline: increased L shoulder blade pain Goal status: INITIAL PLAN:  PT FREQUENCY: 1-2x/week  PT DURATION: 6 weeks  PLANNED INTERVENTIONS: Therapeutic exercises, Therapeutic activity, Neuromuscular re-education, Balance training, Gait training, Patient/Family education, Self Care, Joint mobilization, Orthotic/Fit training, Dry Needling, Electrical stimulation, Spinal mobilization, Cryotherapy, Moist heat, Traction, Ultrasound, Manual therapy, and Re-evaluation.  PLAN FOR NEXT SESSION: core stabilization exercises, neutral spine, mobs to L scapula, manual therapy , dry needling thoracic multifidi, glutes/piriformis.    Rennie Natter, PT, DPT  08/26/2022, 5:15 PM

## 2022-09-01 ENCOUNTER — Encounter: Payer: Self-pay | Admitting: Physical Therapy

## 2022-09-01 ENCOUNTER — Ambulatory Visit: Payer: 59 | Admitting: Physical Therapy

## 2022-09-01 DIAGNOSIS — M898X1 Other specified disorders of bone, shoulder: Secondary | ICD-10-CM | POA: Diagnosis not present

## 2022-09-01 DIAGNOSIS — M5459 Other low back pain: Secondary | ICD-10-CM | POA: Diagnosis not present

## 2022-09-01 DIAGNOSIS — M546 Pain in thoracic spine: Secondary | ICD-10-CM | POA: Diagnosis not present

## 2022-09-01 DIAGNOSIS — M5432 Sciatica, left side: Secondary | ICD-10-CM | POA: Diagnosis not present

## 2022-09-01 DIAGNOSIS — R252 Cramp and spasm: Secondary | ICD-10-CM | POA: Diagnosis not present

## 2022-09-01 NOTE — Therapy (Signed)
OUTPATIENT PHYSICAL THERAPY TREATMENT NOTES   Patient Name: Nicole Crosby MRN: [id number] DOB:[birth year], 56 y.o., female Today's Date: 09/01/2022   PT End of Session - 09/01/22 1619     Visit Number 2    Number of Visits 12    Date for PT Re-Evaluation 10/07/22    Authorization Type [facility]    PT Start Time 8786    PT Stop Time 1708    PT Time Calculation (min) 51 min    Activity Tolerance Patient tolerated treatment well    Behavior During Therapy [facility] for tasks assessed/performed              Past Medical History:  Diagnosis Date   Allergy    Buspirone, Sulfa, Pantoprazole   Benign fundic gland polyps of stomach 02/12/2016   Chicken pox    Dizziness    Eczema    Functional dyspepsia    GERD (gastroesophageal reflux disease)    H/O adenomatous polyp of colon 10/04/2014   09/2014 - 3 mm adenoma - repeat colonoscopy 2022   Hyperlipidemia    no meds   Hypertension    pt denies   IBS (irritable bowel syndrome)    Insomnia    MEDIAL EPICONDYLITIS, RIGHT 07/17/2010   Nonulcer dyspepsia 02/12/2016   Other abnormal glucose    Tiny hypodensity left hepatic lobe-CT of abdomen and pelvis 7/67/2094-[facility] 11m periumblical hernia   PAC (premature atrial contraction)    Palpitations    Past Surgical History:  Procedure Laterality Date   CESAREAN SECTION  01/2001   [place]  [place]   ESOPHAGOGASTRODUODENOSCOPY     Patient Active Problem List   Diagnosis Date Noted   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   Nausea and vomiting 12/25/2019   Abdominal pain 12/25/2019   Constipation 12/25/2019   Change in bowel habit 12/25/2019   Hypokalemia 09/20/2019   Flexural eczema 05/17/2018   Eyelid dermatitis, allergic/contact 05/17/2018   Medial epicondylitis, right 03/17/2018   Ulnar nerve entrapment at elbow, 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   Vitamin D deficiency 11/03/2017   Shortened PR interval 09/27/2017   Greater trochanteric  bursitis of left hip 05/12/2017   B12 deficiency 01/22/2017   Memory changes 08/28/2016   Nonulcer dyspepsia 02/12/2016   Benign fundic gland polyps of stomach 02/12/2016   Paresthesia 12/17/2015   Bladder spasm 12/13/2014   H/O adenomatous polyp of colon 10/04/2014   Screening for malignant neoplasm of cervix 05/31/2013   Physical exam 05/31/2013   Left L3 lumbar radiculitis 09/23/2012   Neck pain 08/22/2012   [place]   Essential hypertension, benign 03/02/2012   IBS (irritable bowel syndrome)    PAC (premature atrial contraction)    Insomnia    GERD (gastroesophageal reflux disease)    DYSTHYMIA 09/30/2009   HEADACHE 09/21/2008   HEPATIC CYST 08/14/2008   Hyperlipidemia 08/06/2008    PCP: MKathyrn Lass MD   REFERRING PROVIDER: AMelvenia Beam MD  REFERRING DIAG:  [id number](ICD-10-CM) - Shoulder blade pain  M54.32 (ICD-10-CM) - Sciatica of left side    Rationale for Evaluation and Treatment Rehabilitation  THERAPY DIAG:  Pain in thoracic spine  Other low back pain  Cramp and spasm  ONSET DATE: chronic several years  SUBJECTIVE:  SUBJECTIVE STATEMENT: STEPH CHEADLE reports didn't notice anything with the lunges so didn't do them.  Notices pain across shoulder blades when doing laundry.   Just sore in tailbone today.    PERTINENT HISTORY:  Chronic back/sciatica, c-section  PAIN:  Are you having pain? Yes: NPRS scale: 0/10 Pain location: tailbone Pain description: aches  Aggravating factors: sitting down, getting up Relieving factors: haven't tried anything.   Are you having pain? Yes: NPRS scale: 0/10 Pain location: L shoulder blade Pain description: numb, tingling, uncomfortable Aggravating factors: prolonged standing, walking  Relieving  factors: sitting down   PRECAUTIONS: None  WEIGHT BEARING RESTRICTIONS: No  FALLS:  Has patient fallen in last 6 months? No  LIVING ENVIRONMENT: Lives with: lives with their family Lives in: House/apartment Stairs: Yes: External: 2 steps; none Has following equipment at home: None  OCCUPATION: Imaging tech  PLOF: Independent, 15 min walking/15 min bike daily  PATIENT GOALS: make symptoms better    OBJECTIVE:   DIAGNOSTIC FINDINGS:  No relevant imaging  PATIENT SURVEYS:  Quick Dash 6.8%  COGNITION: Overall cognitive status: Within functional limits for tasks assessed     SENSATION: WFL  MUSCLE LENGTH: Hamstrings: Right 90 deg; Left 90 deg   POSTURE: No Significant postural limitations  PALPATION: Tenderness over bil SIJ, bil piriformis and glut med.  Tenderness L Thoracic paraspinals, Rhomboids   LUMBAR ROM:   AROM eval  Flexion To ankles  Extension WNL, *   Right lateral flexion To knee, tight  Left lateral flexion To knee, tight  Right rotation WNL *  Left rotation WNL *    (Blank rows = not tested) * pain   LOWER EXTREMITY ROM:   good hip ROM bil, symmetric but increased pain at end range with external rotation   LOWER EXTREMITY MMT:     MMT Right eval Left eval  Hip flexion 5 5  Hip extension 5 5  Hip abduction 5 5  Hip adduction 5 5  Knee flexion 5 5  Knee extension 5 5  Ankle dorsiflexion 5 5  Ankle plantarflexion 5 5   (Blank rows = not tested)  UPPER EXTREMITY ROM:  full ROM, symmetric and pain free  LUMBAR SPECIAL TESTS:  Straight leg raise test: Negative, SI Compression/distraction test: Positive, and FABER test: Positive  FUNCTIONAL TESTS:  5 times sit to stand: 11.75 seconds without UE assist, no pain.   GAIT: Distance walked: 75' Assistive device utilized: None Level of assistance: Complete Independence Comments: no significant deviation or device  TODAY'S TREATMENT:                                                                                                                               DATE:   09/01/2022 Therapeutic Exercise: to improve strength and mobility.  Demo, verbal and tactile cues throughout for technique. Nustep L5 x 6 min  PPT x 10  Bridges x 10  MET resisted L hip  extension - 3 rounds, last round with single leg bridge, negative retest of supine to long sit test PPT with hip adduction squeeze 10 x 5 sec  Supine clams RTB x 15 - increased pressure on tailbone S/L clams RTB x 10 bil - improved pain Manual Therapy: to decrease muscle spasm and pain and improve mobility PA/UPA mobs to thoracic spine, STM/TPR to thoracic paraspinals (T6-T8) STM/TPR to L glute med and piriformis, skilled palpation and monitoring during dry needling. Trigger Point Dry-Needling  Treatment instructions: Expect mild to moderate muscle soreness. S/S of pneumothorax if dry needled over a lung field, and to seek immediate medical attention should they occur. Patient verbalized understanding of these instructions and education. Patient Consent Given: Yes Education handout provided: Yes Muscles treated: L glut med, L piriformis Electrical stimulation performed: No Parameters: N/A Treatment response/outcome: Twitch Response Elicited and Palpable Increase in Muscle Length  08/26/2022 - see patient education     PATIENT EDUCATION:  Education details:dry needling, HEP Person educated: Patient Education method: Consulting civil engineer, Demonstration, Verbal cues, and Handouts Education comprehension: verbalized understanding and returned demonstration  HOME EXERCISE PROGRAM: Access Code: [email or web address] URL: [email or web address] Date: 09/01/2022 Prepared by: Glenetta Hew  Exercises - Supine Posterior Pelvic Tilt  - 1 x daily - 7 x weekly - 2 sets - 10 reps - Supine Hip Adduction Isometric with Ball  - 1 x daily - 7 x weekly - 2 sets - 10 reps - Supine Bridge  - 1 x daily - 7 x weekly - 2 sets - 10 reps -  Clamshell with Resistance  - 1 x daily - 7 x weekly - 2 sets - 10 reps  ASSESSMENT:  CLINICAL IMPRESSION: ABBEGAYLE DENAULT reports no changes since initial evaluation, although noted pain across shoulders when doing laundry.  Today focused on SIJ pain, noted positive pelvic rotation on L with supine to long sit test, corrected with muscle energy technique, followed up with lumbar stabilization exercises then manual therapy including dry needling. HELENE BERNSTEIN continues to demonstrate potential for improvement and would benefit from continued skilled therapy to address impairments.     OBJECTIVE IMPAIRMENTS: increased muscle spasms, impaired sensation, and pain.   ACTIVITY LIMITATIONS: standing, transfers, and locomotion level  PARTICIPATION LIMITATIONS: cleaning, laundry, and community activity  PERSONAL FACTORS: Time since onset of injury/illness/exacerbation and 1 comorbidity: chronic LBP  are also affecting patient's functional outcome.   REHAB POTENTIAL: Excellent  CLINICAL DECISION MAKING: Stable/uncomplicated  EVALUATION COMPLEXITY: Low   GOALS: Goals reviewed with patient? Yes  SHORT TERM GOALS: Target date: 09/09/2022   Patient will be independent with initial HEP.  Baseline: needs Goal status: IN PROGRESS    LONG TERM GOALS: Target date: 10/07/2022    Patient will be independent with advanced/ongoing HEP to improve outcomes and carryover.  Baseline: needs progression  Goal status: IN PROGRESS  2.  Patient will report 75% improvement in low back/SIJ/coccygeal pain to improve QOL.  Baseline:  Goal status: IN PROGRESS  3.  Patient will demonstrate full pain free lumbar ROM to perform ADLs.   Baseline: see objective Goal status: IN PROGRESS  4.  Patient will report 75% improvement in numbness/tingling in L shoulder blade with activity Baseline: increases with prolonged standing/walking Goal status: IN PROGRESS  5.  Patient will tolerate 15 min of walking to  exercise without pain. Baseline: increased L shoulder blade pain Goal status: IN PROGRESS PLAN:  PT FREQUENCY: 1-2x/week  PT DURATION: 6 weeks  PLANNED INTERVENTIONS: Therapeutic exercises, Therapeutic  activity, Neuromuscular re-education, Balance training, Gait training, Patient/Family education, Self Care, Joint mobilization, Orthotic/Fit training, Dry Needling, Electrical stimulation, Spinal mobilization, Cryotherapy, Moist heat, Traction, Ultrasound, Manual therapy, and Re-evaluation.  PLAN FOR NEXT SESSION: thoracic mobilization exercises, manual to thoracic spine.    Rennie Natter, PT, DPT  09/01/2022, 6:11 PM

## 2022-09-03 ENCOUNTER — Encounter: Payer: Self-pay | Admitting: Cardiology

## 2022-09-03 DIAGNOSIS — L720 Epidermal cyst: Secondary | ICD-10-CM | POA: Diagnosis not present

## 2022-09-04 ENCOUNTER — Other Ambulatory Visit (HOSPITAL_BASED_OUTPATIENT_CLINIC_OR_DEPARTMENT_OTHER): Payer: Self-pay

## 2022-09-07 ENCOUNTER — Other Ambulatory Visit (HOSPITAL_BASED_OUTPATIENT_CLINIC_OR_DEPARTMENT_OTHER): Payer: Self-pay

## 2022-09-07 DIAGNOSIS — I700 Atherosclerosis of aorta: Secondary | ICD-10-CM | POA: Diagnosis not present

## 2022-09-07 DIAGNOSIS — I10 Essential (primary) hypertension: Secondary | ICD-10-CM | POA: Diagnosis not present

## 2022-09-07 DIAGNOSIS — Z713 Dietary counseling and surveillance: Secondary | ICD-10-CM | POA: Diagnosis not present

## 2022-09-07 DIAGNOSIS — R7303 Prediabetes: Secondary | ICD-10-CM | POA: Diagnosis not present

## 2022-09-07 MED ORDER — POTASSIUM CHLORIDE 20 MEQ PO PACK
20.0000 meq | PACK | Freq: Every day | ORAL | 0 refills | Status: DC
  Filled 2022-09-07: qty 30, 30d supply, fill #0

## 2022-09-08 ENCOUNTER — Encounter: Payer: 59 | Admitting: Physical Therapy

## 2022-09-08 ENCOUNTER — Other Ambulatory Visit (HOSPITAL_BASED_OUTPATIENT_CLINIC_OR_DEPARTMENT_OTHER): Payer: Self-pay

## 2022-09-08 DIAGNOSIS — J343 Hypertrophy of nasal turbinates: Secondary | ICD-10-CM | POA: Insufficient documentation

## 2022-09-08 DIAGNOSIS — J3489 Other specified disorders of nose and nasal sinuses: Secondary | ICD-10-CM | POA: Diagnosis not present

## 2022-09-08 MED ORDER — FLUTICASONE PROPIONATE 50 MCG/ACT NA SUSP
NASAL | 5 refills | Status: DC
  Filled 2022-09-08: qty 16, 30d supply, fill #0
  Filled 2022-10-07: qty 16, 30d supply, fill #1

## 2022-09-09 ENCOUNTER — Encounter: Payer: Self-pay | Admitting: Physical Therapy

## 2022-09-09 ENCOUNTER — Ambulatory Visit: Payer: 59 | Attending: Neurology | Admitting: Physical Therapy

## 2022-09-09 ENCOUNTER — Other Ambulatory Visit (HOSPITAL_BASED_OUTPATIENT_CLINIC_OR_DEPARTMENT_OTHER): Payer: Self-pay

## 2022-09-09 DIAGNOSIS — M5459 Other low back pain: Secondary | ICD-10-CM | POA: Insufficient documentation

## 2022-09-09 DIAGNOSIS — R252 Cramp and spasm: Secondary | ICD-10-CM | POA: Diagnosis not present

## 2022-09-09 DIAGNOSIS — M546 Pain in thoracic spine: Secondary | ICD-10-CM | POA: Diagnosis not present

## 2022-09-09 NOTE — Therapy (Signed)
OUTPATIENT PHYSICAL THERAPY TREATMENT NOTES   Patient Name: Nicole Crosby MRN: [id number] DOB:July 18, 1966, 56 y.o., female Today's Date: 09/09/2022   PT End of Session - 09/09/22 1533     Visit Number 3    Number of Visits 12    Date for PT Re-Evaluation 10/07/22    Authorization Type [facility]    PT Start Time 1533    PT Stop Time 1630    PT Time Calculation (min) 57 min    Activity Tolerance Patient tolerated treatment well    Behavior During Therapy [facility] for tasks assessed/performed              Past Medical History:  Diagnosis Date   Allergy    Buspirone, Sulfa, Pantoprazole   Benign fundic gland polyps of stomach 02/12/2016   Chicken pox    Dizziness    Eczema    Functional dyspepsia    GERD (gastroesophageal reflux disease)    H/O adenomatous polyp of colon 10/04/2014   09/2014 - 3 mm adenoma - repeat colonoscopy 2022   Hyperlipidemia    no meds   Hypertension    pt denies   IBS (irritable bowel syndrome)    Insomnia    MEDIAL EPICONDYLITIS, RIGHT 07/17/2010   Nonulcer dyspepsia 02/12/2016   Other abnormal glucose    Tiny hypodensity left hepatic lobe-CT of abdomen and pelvis 0/07/3817-EXHBZ 52m periumblical hernia   PAC (premature atrial contraction)    Palpitations    Past Surgical History:  Procedure Laterality Date   CESAREAN SECTION  01/2001   COLONOSCOPY     DE QUERVAIN'S RELEASE  2000   Right wrist area   ESOPHAGOGASTRODUODENOSCOPY     Patient Active Problem List   Diagnosis Date Noted   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   Nausea and vomiting 12/25/2019   Abdominal pain 12/25/2019   Constipation 12/25/2019   Change in bowel habit 12/25/2019   Hypokalemia 09/20/2019   Flexural eczema 05/17/2018   Eyelid dermatitis, allergic/contact 05/17/2018   Medial epicondylitis, right 03/17/2018   Ulnar nerve entrapment at elbow, 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   Vitamin D deficiency 11/03/2017   Shortened PR interval 09/27/2017   Greater trochanteric  bursitis of left hip 05/12/2017   B12 deficiency 01/22/2017   Memory changes 08/28/2016   Nonulcer dyspepsia 02/12/2016   Benign fundic gland polyps of stomach 02/12/2016   Paresthesia 12/17/2015   Bladder spasm 12/13/2014   H/O adenomatous polyp of colon 10/04/2014   Screening for malignant neoplasm of cervix 05/31/2013   Physical exam 05/31/2013   Left L3 lumbar radiculitis 09/23/2012   Neck pain 08/22/2012   Adrenal nodule ([place] 03/02/2012   Essential hypertension, benign 03/02/2012   IBS (irritable bowel syndrome)    PAC (premature atrial contraction)    Insomnia    GERD (gastroesophageal reflux disease)    DYSTHYMIA 09/30/2009   HEADACHE 09/21/2008   HEPATIC CYST 08/14/2008   Hyperlipidemia 08/06/2008    PCP: MKathyrn Lass MD   REFERRING PROVIDER: AMelvenia Beam MD  REFERRING DIAG:  [phone](ICD-10-CM) - Shoulder blade pain  M54.32 (ICD-10-CM) - Sciatica of left side    Rationale for Evaluation and Treatment Rehabilitation  THERAPY DIAG:  Pain in thoracic spine  Other low back pain  Cramp and spasm  ONSET DATE: chronic several years  SUBJECTIVE:  SUBJECTIVE STATEMENT: Nicole Crosby reports the bridges gave her vertigo, so stopped doing them.  "I'm not worried about my posture anymore, so I'm not putting them back or trying to pull my shoulders back."   Tailbone is feeling better.   PERTINENT HISTORY:  Chronic back/sciatica, c-section  PAIN:  Are you having pain? Yes: NPRS scale: 0/10 Pain location: tailbone Pain description: aches  Aggravating factors: sitting down, getting up Relieving factors: haven't tried anything.   Are you having pain? Yes: NPRS scale: 0/10 Pain location: L shoulder blade Pain description: numb, tingling, uncomfortable Aggravating factors:  prolonged standing, walking  Relieving factors: sitting down   PRECAUTIONS: None  WEIGHT BEARING RESTRICTIONS: No  FALLS:  Has patient fallen in last 6 months? No  LIVING ENVIRONMENT: Lives with: lives with their family Lives in: House/apartment Stairs: Yes: External: 2 steps; none Has following equipment at home: None  OCCUPATION: Imaging tech  PLOF: Independent, 15 min walking/15 min bike daily  PATIENT GOALS: make symptoms better    OBJECTIVE:   DIAGNOSTIC FINDINGS:  No relevant imaging  PATIENT SURVEYS:  Quick Dash 6.8%  COGNITION: Overall cognitive status: Within functional limits for tasks assessed     SENSATION: WFL  MUSCLE LENGTH: Hamstrings: Right 90 deg; Left 90 deg   POSTURE: No Significant postural limitations  PALPATION: Tenderness over bil SIJ, bil piriformis and glut med.  Tenderness L Thoracic paraspinals, Rhomboids   LUMBAR ROM:   AROM eval  Flexion To ankles  Extension WNL, *   Right lateral flexion To knee, tight  Left lateral flexion To knee, tight  Right rotation WNL *  Left rotation WNL *    (Blank rows = not tested) * pain   LOWER EXTREMITY ROM:   good hip ROM bil, symmetric but increased pain at end range with external rotation   LOWER EXTREMITY MMT:     MMT Right eval Left eval  Hip flexion 5 5  Hip extension 5 5  Hip abduction 5 5  Hip adduction 5 5  Knee flexion 5 5  Knee extension 5 5  Ankle dorsiflexion 5 5  Ankle plantarflexion 5 5   (Blank rows = not tested)  UPPER EXTREMITY ROM:  full ROM, symmetric and pain free  LUMBAR SPECIAL TESTS:  Straight leg raise test: Negative, SI Compression/distraction test: Positive, and FABER test: Positive  FUNCTIONAL TESTS:  5 times sit to stand: 11.75 seconds without UE assist, no pain.   GAIT: Distance walked: 4' Assistive device utilized: None Level of assistance: Complete Independence Comments: no significant deviation or device  TODAY'S TREATMENT:                                                                                                                               DATE:  09/09/2022 Therapeutic Exercise: to improve strength and mobility.  Demo, verbal and tactile cues throughout for technique. UBE x 8 min ([id number]) Rows RTB x 15 Shoulder  extension RTB x 15 Standing open books x 10 bil  Wall angels x 10 - pulling in R low back, improved with modification, unable to perform full ROM Thoracic self mobs with foam roller in standing at wall x 10 Low back mobs with foam roller in standing at wall x 10  Thoracic extension shoulders against foam roller x 10 alternating.  Manual Therapy: to decrease muscle spasm and pain and improve mobility STM/TPR to R lumbar paraspinals and R QL, IASTM with foam roller to bil glutes and proximal hamstrings.  skilled palpation and monitoring during dry needling. Trigger Point Dry-Needling  Treatment instructions: Expect mild to moderate muscle soreness. S/S of pneumothorax if dry needled over a lung field, and to seek immediate medical attention should they occur. Patient verbalized understanding of these instructions and education. Patient Consent Given: Yes Education handout provided: Previously provided Muscles treated: R QL Electrical stimulation performed: No Parameters: N/A Treatment response/outcome: Twitch Response Elicited and Palpable Increase in Muscle Length  09/01/2022 Therapeutic Exercise: to improve strength and mobility.  Demo, verbal and tactile cues throughout for technique. Nustep L5 x 6 min  PPT x 10  Bridges x 10  MET resisted L hip extension - 3 rounds, last round with single leg bridge, negative retest of supine to long sit test PPT with hip adduction squeeze 10 x 5 sec  Supine clams RTB x 15 - increased pressure on tailbone S/L clams RTB x 10 bil - improved pain Manual Therapy: to decrease muscle spasm and pain and improve mobility PA/UPA mobs to thoracic spine, STM/TPR to thoracic  paraspinals (T6-T8) STM/TPR to L glute med and piriformis, skilled palpation and monitoring during dry needling. Trigger Point Dry-Needling  Treatment instructions: Expect mild to moderate muscle soreness. S/S of pneumothorax if dry needled over a lung field, and to seek immediate medical attention should they occur. Patient verbalized understanding of these instructions and education. Patient Consent Given: Yes Education handout provided: Yes Muscles treated: L glut med, L piriformis Electrical stimulation performed: No Parameters: N/A Treatment response/outcome: Twitch Response Elicited and Palpable Increase in Muscle Length  08/26/2022 - see patient education     PATIENT EDUCATION:  Education details: HEP progression 09/09/2022 Person educated: Patient Education method: Explanation, Demonstration, Verbal cues, and Handouts Education comprehension: verbalized understanding and returned demonstration  HOME EXERCISE PROGRAM: Access Code: [email or web address] URL: [email or web address] Date: 09/09/2022 Prepared by: Glenetta Hew  Exercises Added  - Standing Thoracic Open Book at [place]  - 1 x daily - 7 x weekly - 1-2 sets - 10 reps - Shoulder extension with resistance - Neutral  - 1 x daily - 7 x weekly - 3 sets - 10 reps - Standing Shoulder Row with Anchored Resistance  - 1 x daily - 7 x weekly - 3 sets - 10 reps - Wall Angels  - 1 x daily - 7 x weekly - 2 sets - 10 reps - Wall Quarter Squat with Foam Roller  - 1 x daily - 7 x weekly - 2 sets - 10 reps - Alternating Shoulder Flexion at Wall with Foam Roller  - 1 x daily - 7 x weekly - 2 sets - 10 reps  ASSESSMENT:  CLINICAL IMPRESSION: Nicole Crosby feels last session did help with tailbone pain, apprehensive about dry needling in thoracic spine.  Focused today on thoracic mobilization exercises, performed in standing due to complaint of vertigo with supine exercises.  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  She had spasm in her R QL, improved with STM/TPR, and then she did request trial of DN to QL as well, which she tolerated well and reported decreased pain/tightness following.  Nicole Crosby continues to demonstrate potential for improvement and would benefit from continued skilled therapy to address impairments.     OBJECTIVE IMPAIRMENTS: increased muscle spasms, impaired sensation, and pain.   ACTIVITY LIMITATIONS: standing, transfers, and locomotion level  PARTICIPATION LIMITATIONS: cleaning, laundry, and community activity  PERSONAL FACTORS: Time since onset of injury/illness/exacerbation and 1 comorbidity: chronic LBP  are also affecting patient's functional outcome.   REHAB POTENTIAL: Excellent  CLINICAL DECISION MAKING: Stable/uncomplicated  EVALUATION COMPLEXITY: Low   GOALS: Goals reviewed with patient? Yes  SHORT TERM GOALS: Target date: 09/09/2022   Patient will be independent with initial HEP.  Baseline: needs Goal status: IN PROGRESS  09/09/2022- completes most of exercises, avoids supine due to vertigo.     LONG TERM GOALS: Target date: 10/07/2022    Patient will be independent with advanced/ongoing HEP to improve outcomes and carryover.  Baseline: needs progression  Goal status: IN PROGRESS  2.  Patient will report 75% improvement in low back/SIJ/coccygeal pain to improve QOL.  Baseline:  Goal status: IN PROGRESS  3.  Patient will demonstrate full pain free lumbar ROM to perform ADLs.   Baseline: see objective Goal status: IN PROGRESS  4.  Patient will report 75% improvement in numbness/tingling in L shoulder blade with activity Baseline: increases with prolonged standing/walking Goal status: IN PROGRESS  5.  Patient will tolerate 15 min of walking to exercise without pain. Baseline: increased L shoulder blade pain Goal  status: IN PROGRESS PLAN:  PT FREQUENCY: 1-2x/week  PT DURATION: 6 weeks  PLANNED INTERVENTIONS: Therapeutic exercises, Therapeutic activity, Neuromuscular re-education, Balance training, Gait training, Patient/Family education, Self Care, Joint mobilization, Orthotic/Fit training, Dry Needling, Electrical stimulation, Spinal mobilization, Cryotherapy, Moist heat, Traction, Ultrasound, Manual therapy, and Re-evaluation.  PLAN FOR NEXT SESSION: review and progress exercises for thoracic and core strengthening as tolerated, does not tolerate supine well due to complaint of vertigo.    Rennie Natter, PT, DPT  09/09/2022, 4:43 PM

## 2022-09-14 ENCOUNTER — Other Ambulatory Visit (HOSPITAL_BASED_OUTPATIENT_CLINIC_OR_DEPARTMENT_OTHER): Payer: Self-pay

## 2022-09-14 MED ORDER — POTASSIUM CHLORIDE ER 20 MEQ PO TBCR
1.0000 | EXTENDED_RELEASE_TABLET | Freq: Two times a day (BID) | ORAL | 0 refills | Status: DC
  Filled 2022-10-07: qty 180, fill #0
  Filled 2022-10-10: qty 180, 90d supply, fill #0

## 2022-09-15 ENCOUNTER — Encounter: Payer: 59 | Admitting: Physical Therapy

## 2022-09-16 ENCOUNTER — Ambulatory Visit (INDEPENDENT_AMBULATORY_CARE_PROVIDER_SITE_OTHER): Payer: 59

## 2022-09-16 ENCOUNTER — Encounter: Payer: Self-pay | Admitting: Neurology

## 2022-09-16 DIAGNOSIS — Z780 Asymptomatic menopausal state: Secondary | ICD-10-CM | POA: Diagnosis not present

## 2022-09-16 DIAGNOSIS — Z8262 Family history of osteoporosis: Secondary | ICD-10-CM

## 2022-09-16 DIAGNOSIS — M791 Myalgia, unspecified site: Secondary | ICD-10-CM | POA: Diagnosis not present

## 2022-09-16 DIAGNOSIS — M810 Age-related osteoporosis without current pathological fracture: Secondary | ICD-10-CM | POA: Diagnosis not present

## 2022-09-16 NOTE — Telephone Encounter (Signed)
Spoke to patient during rehab she extended and flexed head and neck . Pt states after that her gait was off and walking side ways Pt states missed a day of work due to those symptoms. Pt states she doesn't think its  vertigo and she doesn't think she is having  a stroke  Pt did make a appointment  with Amy Lomax,NP for Monday 09/21/22. Did advise patient to go to Urgent care or ED to get a evaluation. Pt states will not go to Urgent Care because they don't have a CT scan and she want go to ED due to long wait time . Pt states she will wait until Monday to see Amy,NP

## 2022-09-17 ENCOUNTER — Ambulatory Visit: Payer: 59

## 2022-09-17 DIAGNOSIS — M546 Pain in thoracic spine: Secondary | ICD-10-CM | POA: Diagnosis not present

## 2022-09-17 DIAGNOSIS — R252 Cramp and spasm: Secondary | ICD-10-CM | POA: Diagnosis not present

## 2022-09-17 DIAGNOSIS — M5459 Other low back pain: Secondary | ICD-10-CM | POA: Diagnosis not present

## 2022-09-17 NOTE — Progress Notes (Signed)
Chief Complaint  Patient presents with   Follow-up    RM 14, alone. Last seen 08/06/22. Unable to lay flat, has to lay on two pillows.  Has had vertigo and this episode was different. Was doing supine bridge for PT at home. Sx started after things. Felt like she was falling. Took valium, Zofran. Zofran helped some. Got up in middle of night to go to the bathroom. Wanted to fall left. Very unsteady.  Next morning, did not go to work. Still felt off.     HISTORY OF PRESENT ILLNESS:  09/21/22 ALL:  Nicole Crosby is a 56 y.o. female here today for follow up for dizziness and concerns of gait changes after working with PT 09/15/2022. History of chronic neck pain and vertigo. She called to report that while working with PT they were doing exercises of flexion and extension the hips, she felt  sensation that she was falling. It got worse with each rep. When she got up, her gait was off and she was "walking sideways". She took Zofran and Valium that helped her go to sleep. She missed work the following day. She was advised to go to the ER for evaluation of new symptoms but refused. She was seen 10/5 by Dr Jaynee Eagles and reported dizziness and presyncopal sensation when looking upward. CTA head and neck unremarkable. MRI 11/2021 was unremarkable with exception of left superior cerebellar artery was close to left cisternal trigeminal nerve.   Today, she reports symptoms have improved. She has been able to go back to work. Gait is baseline. She continues to feel an unusual sensation in her head but reports it is improved. Her physical therapist recommended vestibular exercises but she was concerned as she has chronic neck pain. MR cervical soine 2020 showed mild progression of arthritic changes with possible pinched nerve. She was referred to ortho who recommended injection therapy. She declined. She manages neck pain with OTC analgesics. She occasionally has left sided radiating pain.   She is a Archivist. Works  five eight hour shifts. She sleeps fairly well. She does take melatonin at bedtime.   HISTORY (copied from Dr Cathren Laine previous note)  Follow up 08/06/2022: Mri was largely unremarkable except the left superior cerebellar artery abuts the left cisternal trigeminal nerve. Thin cuts trough the IAC did not reveal any etiology for her dizziness. No strokes seen, MRI brain largely normal.That is what can be causing her    Today she is here for something new. For several months looks up she almost passes out, lightheaded with head extension and with neck turning, she moves her neck back into neutral position and feels better, feels like she is going to vomit. Vision goes dark, feels like she is going to pass out, pre-syncopal. She is not having any abnormal lip movements today and no hx of anti-dopamine blocking agents so unlikely TD or other movement disorder may consick a tic or habit from wearing a mask. Numbness under her left shoulder blade when she walks it goes numb. (She has had it for years). Getting worse. Now the other shoulder blade, only with particular movements, recommend PT and if that fails would image the t-spine. MRI c-spine in 2020 did not show any etioligy at that level. Also possible sciatica left leg, can also try to have PT address it and if still continues with numbness and tingling down the left leg we can order MR Lumbar spine.    11/17/2021: Patient seen in the past,  here for new issue. She had covid n June and felt "off' and dizziness, she feels she has cognitive issues since covid, random, happens sometimes forget maybe what to do next, she may forget some names. The name will eventually come to her. The dizziness is better but she still has it, no room spinning but something doesn't feel "normal in my head", she has ringing in the left ear, hearing loss right ear, no weakness, no numbness, she has some neck pain and had PT and sports medicine, cervicogenic dizziness or cervicalgia. She  has left eye pressure and changes in vision left eye. Hgba1c 5.7 11/21/2020, TSH normal 11/21/2020. MRI Trigeminal nerve negative (05/29/2020)   REVIEW OF SYSTEMS: Out of a complete 14 system review of symptoms, the patient complains only of the following symptoms, chronic neck pain with radiating symptoms to left arm, dizziness, syncopal sensation with looking upward, and all other reviewed systems are negative.   ALLERGIES: Allergies  Allergen Reactions   Buspirone Other (See Comments)     nightmare   Esomeprazole Other (See Comments)    headache   Ezetimibe     Abdominal pain, diarrhea   Lansoprazole     Abdominal pain   Omeprazole     Headache   Proton Pump Inhibitors    Sulfonamide Derivatives Rash     Rash     HOME MEDICATIONS: Outpatient Medications Prior to Visit  Medication Sig Dispense Refill   Calcium Carbonate-Vit D-Min (CALCIUM 1200 PO) Take 1,200 Units by mouth daily.     cholecalciferol (VITAMIN D) 1000 UNITS tablet Take 2,000 Units by mouth daily.     clindamycin (CLINDAGEL) 1 % gel Apply 1 Application topically as needed.     diazepam (VALIUM) 5 MG tablet Take 1 tablet (5 mg total) by mouth every 8 (eight) hours as needed (vertigo). 8 tablet 0   famotidine (PEPCID) 20 MG tablet TAKE 1 TABLET BY MOUTH TWICE DAILY 60 tablet 11   fluticasone (FLONASE) 50 MCG/ACT nasal spray Administer 2 sprays in each nostril daily. 16 g 5   hydrocortisone 2.5 % ointment APPLY TWICE A DAY FOR 2 WEEKS 28.35 g 0   influenza vac split quadrivalent PF (FLUARIX) 0.5 ML injection Inject into the muscle. 0.5 mL 0   meclizine (ANTIVERT) 25 MG tablet Take 25 mg by mouth 2 (two) times daily as needed for dizziness.     melatonin 3 MG TABS tablet Take 3 mg by mouth at bedtime as needed.     methocarbamol (ROBAXIN) 500 MG tablet Take 500 mg by mouth every 6 (six) hours as needed.     metoprolol succinate (TOPROL XL) 25 MG 24 hr tablet Take 1/2 tablet (12.5 mg total) by mouth at bedtime. 45  tablet 3   metroNIDAZOLE (METROCREAM) 0.75 % cream      ondansetron (ZOFRAN ODT) 4 MG disintegrating tablet Take 1 tablet (4 mg total) by mouth every 8 (eight) hours as needed for nausea or vomiting. 20 tablet 0   OSCIMIN 0.125 MG SUBL PLACE 1 TABLET (0.125 MG TOTAL) UNDER THE TONGUE EVERY 4 (FOUR) HOURS AS NEEDED. 60 tablet 0   potassium chloride (KLOR-CON) 20 MEQ packet Take 20 mEq by mouth daily with food. 30 packet 0   Potassium Chloride ER 20 MEQ TBCR Take 1 tablet by mouth 2 (two) times daily. 180 tablet 0   potassium chloride SA (KLOR-CON M) 20 MEQ tablet Take 1 tablet (20 mEq total) by mouth 2 (two) times daily. 180 tablet  2   rosuvastatin (CRESTOR) 10 MG tablet Take 1 tablet (10 mg total) by mouth daily. 90 tablet 3   vitamin B-12 (CYANOCOBALAMIN) 500 MCG tablet Take 500 mcg by mouth daily.     losartan-hydrochlorothiazide (HYZAAR) 50-12.5 MG tablet TAKE 1 TABLET BY MOUTH ONCE DAILY 90 tablet 0   Facility-Administered Medications Prior to Visit  Medication Dose Route Frequency Provider Last Rate Last Admin   0.9 %  sodium chloride infusion  500 mL Intravenous Once Gatha Mayer, MD         PAST MEDICAL HISTORY: Past Medical History:  Diagnosis Date   Allergy    Buspirone, Sulfa, Pantoprazole   Benign fundic gland polyps of stomach 02/12/2016   Chicken pox    Dizziness    Eczema    Functional dyspepsia    GERD (gastroesophageal reflux disease)    H/O adenomatous polyp of colon 10/04/2014   09/2014 - 3 mm adenoma - repeat colonoscopy 2022   Hyperlipidemia    no meds   Hypertension    pt denies   IBS (irritable bowel syndrome)    Insomnia    MEDIAL EPICONDYLITIS, RIGHT 07/17/2010   Nonulcer dyspepsia 02/12/2016   Other abnormal glucose    Tiny hypodensity left hepatic lobe-CT of abdomen and pelvis 4/31/5400-QQPYP 96m periumblical hernia   PAC (premature atrial contraction)    Palpitations      PAST SURGICAL HISTORY: Past Surgical History:  Procedure Laterality  Date   CESAREAN SECTION  01/2001   COLONOSCOPY     DE QUERVAIN'S RELEASE  2000   Right wrist area   ESOPHAGOGASTRODUODENOSCOPY       FAMILY HISTORY: Family History  Problem Relation Age of Onset   Hypertension Mother    Hyperlipidemia Mother    Fibrocystic breast disease Mother    Hearing loss Mother    Osteopenia Mother    Dementia Mother    Prostate cancer Father [id number]      deceased   Hypertension Father    Diabetes Father    Kidney disease Father    Heart disease Father        MI   Hyperlipidemia Father    Thyroid disease Sister    Diabetes Sister        pre-diabetes   Hypertension Sister    Arthritis Sister    Diabetes Sister    Hypertension Sister    Hyperlipidemia Sister    Fibrocystic breast disease Sister    Neuropathy Sister    Hyperlipidemia Sister    Hypertension Sister    Other Brother        unknown   Colon cancer Maternal Uncle        died in late 781's  Diabetes Maternal Grandmother    Prostate cancer Maternal Grandfather    COPD Maternal Grandfather    Rectal cancer Neg Hx    Stomach cancer Neg Hx    Esophageal cancer Neg Hx    Pancreatic cancer Neg Hx      SOCIAL HISTORY: Social History   Socioeconomic History   Marital status: Married    Spouse name: Not on file   Number of children: 2   Years of education: Not on file   Highest education level: Not on file  Occupational History   Occupation: Xray/CT TEngineer, production [place]  Tobacco Use   Smoking status: Never   Smokeless tobacco: Never  Vaping Use   Vaping Use: Never used  Substance and  Sexual Activity   Alcohol use: Yes    Alcohol/week: 1.0 standard drink of alcohol    Types: 1 Glasses of wine per week    Comment: occasional   Drug use: No   Sexual activity: Yes    Birth control/protection: Inserts  Other Topics Concern   Not on file  Social History Narrative   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    Alcohol Use - yes-wine         Full  Time    Married           Social Determinants of Health   Financial Resource Strain: Not on file  Food Insecurity: Not on file  Transportation Needs: Not on file  Physical Activity: Not on file  Stress: Not on file  Social Connections: Not on file  Intimate Partner Violence: Not on file     PHYSICAL EXAM  Vitals:   09/21/22 1412  BP: 132/80  Pulse: 83  Weight: 137 lb 6.4 oz (62.3 kg)  Height: '5\' 1"'$  (1.549 m)   Body mass index is 25.96 kg/m.  Generalized: Well developed, in no acute distress  Cardiology: normal rate and rhythm, no murmur auscultated  Respiratory: clear to auscultation bilaterally    Neurological examination  Mentation: Alert oriented to time, place, history taking. Follows all commands speech and language fluent Cranial nerve II-XII: Pupils were equal round reactive to light. Extraocular movements were full, visual field were full on confrontational test. No nystagmus noted. 30 gaze normal. Facial sensation and strength were normal.  Head turning and shoulder shrug  were normal and symmetric. Motor: The motor testing reveals 5 over 5 strength of all 4 extremities. Good symmetric motor tone is noted throughout.  Sensory: Sensory testing is intact to soft touch on all 4 extremities. No evidence of extinction is noted.  Coordination: Cerebellar testing reveals good finger-nose-finger and heel-to-shin bilaterally.  Gait and station: Gait is normal.  Reflexes: Deep tendon reflexes are symmetric and normal bilaterally.    DIAGNOSTIC DATA (LABS, IMAGING, TESTING) - I reviewed patient records, labs, notes, testing and imaging myself where available.  Lab Results  Component Value Date   WBC 5.3 11/09/2020   HGB 14.1 11/09/2020   HCT 42.5 11/09/2020   MCV 91.8 11/09/2020   PLT 288 11/09/2020      Component Value Date/Time   NA 142 11/17/2021 1159   K 4.1 11/17/2021 1159   CL 100 11/17/2021 1159   CO2 30 (H) 11/17/2021 1159   GLUCOSE 156 (H) 11/17/2021  1159   GLUCOSE 86 11/21/2020 1145   BUN 17 11/17/2021 1159   CREATININE 0.61 11/17/2021 1159   CREATININE 0.64 05/04/2018 1644   CALCIUM 10.0 11/17/2021 1159   PROT 7.2 04/24/2022 1003   ALBUMIN 4.4 04/24/2022 1003   AST 19 04/24/2022 1003   ALT 14 04/24/2022 1003   ALKPHOS 76 04/24/2022 1003   BILITOT 0.4 04/24/2022 1003   GFRNONAA >60 11/09/2020 0424   GFRAA >60 12/07/2019 0920   Lab Results  Component Value Date   CHOL 182 04/24/2022   HDL 56 04/24/2022   LDLCALC 111 (H) 04/24/2022   TRIG 84 04/24/2022   CHOLHDL 3.3 04/24/2022   Lab Results  Component Value Date   HGBA1C 5.7 11/21/2020   Lab Results  Component Value Date   VITAMINB12 1,051 (H) 09/20/2019   Lab Results  Component Value Date   TSH 0.66 11/21/2020  No data to display              08/28/2016    9:00 AM  Montreal Cognitive Assessment   Visuospatial/ Executive (0/5) 5  Naming (0/3) 3  Attention: Read list of digits (0/2) 2  Attention: Read list of letters (0/1) 1  Attention: Serial 7 subtraction starting at 100 (0/3) 3  Language: Repeat phrase (0/2) 2  Language : Fluency (0/1) 1  Abstraction (0/2) 2  Delayed Recall (0/5) 5  Orientation (0/6) 6  Total 30  Adjusted Score (based on education) 30     ASSESSMENT AND PLAN  56 y.o. year old female  has a past medical history of Allergy, Benign fundic gland polyps of stomach (02/12/2016), Chicken pox, Dizziness, Eczema, Functional dyspepsia, GERD (gastroesophageal reflux disease), H/O adenomatous polyp of colon (10/04/2014), Hyperlipidemia, Hypertension, IBS (irritable bowel syndrome), Insomnia, MEDIAL EPICONDYLITIS, RIGHT (07/17/2010), Nonulcer dyspepsia (02/12/2016), Other abnormal glucose, PAC (premature atrial contraction), and Palpitations. here with    Gait difficulty  Neck pain  Vertigo - Plan: PT vestibular rehab  MAUI AHART reports an episode last week while doing hip exercises with PT where she suddenly started  having a feeling that she was falling and felt dizzy and off balance when standing. Symptoms were relived with vertigo treatment previously taken including valium and zofran. Neuro workup for dizziness and presyncopal sensation when looking upward have revealed no significant etiology. We will send referral to vestibular therapy. We did review her MR cervical spine in 2020. I have encouraged her to consider return to ortho for continued discussion of neck pain management options. Healthy lifestyle habits encouraged. She will follow up with PCP as directed. She will return to see me as needed. She verbalizes understanding and agreement with this plan.   Orders Placed This Encounter  Procedures   PT vestibular rehab    Standing Status:   Future    Standing Expiration Date:   09/22/2023     No orders of the defined types were placed in this encounter.   I spent 30 minutes of face-to-face and non-face-to-face time with patient.  This included previsit chart review, lab review, study review, order entry, electronic health record documentation, patient education.   Debbora Presto, MSN, FNP-C 09/21/2022, 3:06 PM  [facility] [address], [place], [place] [address] [phone]

## 2022-09-17 NOTE — Patient Instructions (Signed)
Below is our plan:  We will continue to monitor. Consider discussion with ortho about previous cervical stenosis/pinched nerve. We will send you to PT for vestibular therapy   Please make sure you are staying well hydrated. I recommend 50-60 ounces daily. Well balanced diet and regular exercise encouraged. Consistent sleep schedule with 6-8 hours recommended.   Please continue follow up with care team as directed.   Follow up with me as needed, pending vestibular therapy   You may receive a survey regarding today's visit. I encourage you to leave honest feed back as I do use this information to improve patient care. Thank you for seeing me today!

## 2022-09-20 ENCOUNTER — Other Ambulatory Visit (HOSPITAL_BASED_OUTPATIENT_CLINIC_OR_DEPARTMENT_OTHER): Payer: Self-pay

## 2022-09-21 ENCOUNTER — Other Ambulatory Visit (HOSPITAL_BASED_OUTPATIENT_CLINIC_OR_DEPARTMENT_OTHER): Payer: Self-pay

## 2022-09-21 ENCOUNTER — Ambulatory Visit (INDEPENDENT_AMBULATORY_CARE_PROVIDER_SITE_OTHER): Payer: 59 | Admitting: Family Medicine

## 2022-09-21 ENCOUNTER — Encounter: Payer: Self-pay | Admitting: Family Medicine

## 2022-09-21 VITALS — BP 132/80 | HR 83 | Ht 61.0 in | Wt 137.4 lb

## 2022-09-21 DIAGNOSIS — M542 Cervicalgia: Secondary | ICD-10-CM | POA: Diagnosis not present

## 2022-09-21 DIAGNOSIS — R42 Dizziness and giddiness: Secondary | ICD-10-CM | POA: Diagnosis not present

## 2022-09-21 DIAGNOSIS — R269 Unspecified abnormalities of gait and mobility: Secondary | ICD-10-CM

## 2022-09-21 MED ORDER — LOSARTAN POTASSIUM-HCTZ 50-12.5 MG PO TABS
1.0000 | ORAL_TABLET | Freq: Every day | ORAL | 2 refills | Status: DC
  Filled 2022-09-21: qty 90, 90d supply, fill #0
  Filled 2022-12-18: qty 90, 90d supply, fill #1
  Filled 2023-03-19: qty 90, 90d supply, fill #2

## 2022-09-22 ENCOUNTER — Encounter: Payer: 59 | Admitting: Physical Therapy

## 2022-09-28 ENCOUNTER — Telehealth: Payer: Self-pay | Admitting: Pharmacist Clinician (PhC)/ Clinical Pharmacy Specialist

## 2022-09-28 DIAGNOSIS — E785 Hyperlipidemia, unspecified: Secondary | ICD-10-CM

## 2022-09-28 NOTE — Telephone Encounter (Signed)
-----   Message from Rockne Menghini, Bowman sent at 07/13/2022  4:34 PM EDT ----- Regarding: lipid labs Lipid labs 3 months on rosuva 10

## 2022-09-29 ENCOUNTER — Other Ambulatory Visit: Payer: Self-pay | Admitting: Pharmacist Clinician (PhC)/ Clinical Pharmacy Specialist

## 2022-09-29 DIAGNOSIS — E785 Hyperlipidemia, unspecified: Secondary | ICD-10-CM

## 2022-10-05 ENCOUNTER — Encounter: Payer: Self-pay | Admitting: Family Medicine

## 2022-10-06 ENCOUNTER — Other Ambulatory Visit: Payer: Self-pay | Admitting: *Deleted

## 2022-10-06 DIAGNOSIS — R42 Dizziness and giddiness: Secondary | ICD-10-CM

## 2022-10-07 ENCOUNTER — Other Ambulatory Visit (HOSPITAL_BASED_OUTPATIENT_CLINIC_OR_DEPARTMENT_OTHER): Payer: Self-pay

## 2022-10-08 DIAGNOSIS — E785 Hyperlipidemia, unspecified: Secondary | ICD-10-CM | POA: Diagnosis not present

## 2022-10-09 ENCOUNTER — Encounter: Payer: Self-pay | Admitting: Cardiology

## 2022-10-09 LAB — HEPATIC FUNCTION PANEL
ALT: 17 IU/L (ref 0–32)
AST: 22 IU/L (ref 0–40)
Albumin: 4.5 g/dL (ref 3.8–4.9)
Alkaline Phosphatase: 74 IU/L (ref 44–121)
Bilirubin Total: 0.4 mg/dL (ref 0.0–1.2)
Bilirubin, Direct: 0.14 mg/dL (ref 0.00–0.40)
Total Protein: 7.2 g/dL (ref 6.0–8.5)

## 2022-10-10 ENCOUNTER — Other Ambulatory Visit: Payer: Self-pay | Admitting: Cardiology

## 2022-10-10 DIAGNOSIS — R002 Palpitations: Secondary | ICD-10-CM

## 2022-10-12 ENCOUNTER — Other Ambulatory Visit: Payer: Self-pay

## 2022-10-12 ENCOUNTER — Other Ambulatory Visit (HOSPITAL_BASED_OUTPATIENT_CLINIC_OR_DEPARTMENT_OTHER): Payer: Self-pay

## 2022-10-13 ENCOUNTER — Other Ambulatory Visit (HOSPITAL_BASED_OUTPATIENT_CLINIC_OR_DEPARTMENT_OTHER): Payer: Self-pay

## 2022-10-13 MED ORDER — METOPROLOL SUCCINATE ER 25 MG PO TB24
12.5000 mg | ORAL_TABLET | Freq: Every day | ORAL | 3 refills | Status: DC
  Filled 2022-10-13: qty 45, 90d supply, fill #0
  Filled 2023-02-17: qty 45, 90d supply, fill #1
  Filled 2023-05-18: qty 45, 90d supply, fill #2

## 2022-10-14 ENCOUNTER — Ambulatory Visit: Payer: 59 | Attending: Neurology

## 2022-10-14 DIAGNOSIS — R2681 Unsteadiness on feet: Secondary | ICD-10-CM | POA: Insufficient documentation

## 2022-10-14 DIAGNOSIS — R42 Dizziness and giddiness: Secondary | ICD-10-CM | POA: Insufficient documentation

## 2022-10-14 NOTE — Therapy (Unsigned)
OUTPATIENT PHYSICAL THERAPY VESTIBULAR EVALUATION     Patient Name: Nicole Crosby MRN: [id number] DOB:08-20-1966, 56 y.o., female Today's Date: 10/14/2022  END OF SESSION:  PT End of Session - 10/14/22 1531     Visit Number 1    Number of Visits 1    Authorization Type [facility]    PT Start Time 6237    PT Stop Time 6283    PT Time Calculation (min) 45 min    Activity Tolerance Patient tolerated treatment well    Behavior During Therapy WFL for tasks assessed/performed             Past Medical History:  Diagnosis Date   Allergy    Buspirone, Sulfa, Pantoprazole   Benign fundic gland polyps of stomach 02/12/2016   Chicken pox    Dizziness    Eczema    Functional dyspepsia    GERD (gastroesophageal reflux disease)    H/O adenomatous polyp of colon 10/04/2014   09/2014 - 3 mm adenoma - repeat colonoscopy 2022   Hyperlipidemia    no meds   Hypertension    pt denies   IBS (irritable bowel syndrome)    Insomnia    MEDIAL EPICONDYLITIS, RIGHT 07/17/2010   Nonulcer dyspepsia 02/12/2016   Other abnormal glucose    Tiny hypodensity left hepatic lobe-CT of abdomen and pelvis 1/51/7616-WVPXT 32m periumblical hernia   PAC (premature atrial contraction)    Palpitations    Past Surgical History:  Procedure Laterality Date   CESAREAN SECTION  01/2001   COLONOSCOPY     DE QUERVAIN'S RELEASE  2000   Right wrist area   ESOPHAGOGASTRODUODENOSCOPY     Patient Active Problem List   Diagnosis Date Noted   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   Nausea and vomiting 12/25/2019   Abdominal pain 12/25/2019   Constipation 12/25/2019   Change in bowel habit 12/25/2019   Hypokalemia 09/20/2019   Flexural eczema 05/17/2018   Eyelid dermatitis, allergic/contact 05/17/2018   Medial epicondylitis, right 03/17/2018   Ulnar nerve entrapment at elbow, 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   Vitamin D deficiency 11/03/2017   Shortened PR interval 09/27/2017   Greater trochanteric bursitis of left hip  05/12/2017   B12 deficiency 01/22/2017   Memory changes 08/28/2016   Nonulcer dyspepsia 02/12/2016   Benign fundic gland polyps of stomach 02/12/2016   Paresthesia 12/17/2015   Bladder spasm 12/13/2014   H/O adenomatous polyp of colon 10/04/2014   Screening for malignant neoplasm of cervix 05/31/2013   Physical exam 05/31/2013   Left L3 lumbar radiculitis 09/23/2012   Neck pain 08/22/2012   [place]   Essential hypertension, benign 03/02/2012   IBS (irritable bowel syndrome)    PAC (premature atrial contraction)    Insomnia    GERD (gastroesophageal reflux disease)    DYSTHYMIA 09/30/2009   HEADACHE 09/21/2008   HEPATIC CYST 08/14/2008   Hyperlipidemia 08/06/2008    PCP: LKathyrn Lass MD REFERRING PROVIDER: LDebbora Presto NP  REFERRING DIAG: R42 (ICD-10-CM) - Vertigo  THERAPY DIAG:  Dizziness and giddiness  Unsteadiness on feet  ONSET DATE: end of October  Rationale for Evaluation and Treatment: Rehabilitation  SUBJECTIVE:   SUBJECTIVE STATEMENT: Patient reports that she was doing her PT exercises at home (doing a bridge) and felt funny in the head, then couldn't stand and walk and experienced dizziness. Has previously had vertigo 2x and this time felt different. Previously experienced where her eyes "felt like she was looking through a pinball machine." This time,  she felt as though she was falling, even though she was laying down. Has not experienced this dizziness since that one episode. Does report that she has been careful with her movements though so as to limit risk for developing dizziness. Reports no difficulty with driving.  Pt accompanied by: self  PERTINENT HISTORY: vertigo 2x; cervical pain  PAIN:  Are you having pain?  Typical neck pain  PRECAUTIONS: Fall  WEIGHT BEARING RESTRICTIONS: No  FALLS: Has patient fallen in last 6 months? No  LIVING ENVIRONMENT: Lives with: lives with their family Lives in: House/apartment Stairs:  Yes: External: 3 steps; none Has following equipment at home: None  PLOF: Independent  PATIENT GOALS: "if it's the crystals- fix that; if it's not the crystals, figure out what's wrong"   OBJECTIVE:   DIAGNOSTIC FINDINGS: CTA neck 10/11: 1. Normal noncontrast head CT. 2. Patent vasculature of the head and neck without hemodynamically significant stenosis or occlusion. Minimal plaque at the right carotid bifurcation. 3. 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    COGNITION: Overall cognitive status: Within functional limits for tasks assessed   SENSATION: WFL  POSTURE:  No Significant postural limitations  Cervical ROM:   All movements slightly limited by chronic neck pain  STRENGTH: WFL  BED MOBILITY:  Does report some dizziness rolling to the L; able to complete independently   GAIT: Gait pattern: WFL  PATIENT SURVEYS:  FOTO staff did not capture   VESTIBULAR ASSESSMENT:  GENERAL OBSERVATION: NAD   SYMPTOM BEHAVIOR:  Subjective history: see above  Non-Vestibular symptoms: nausea/vomiting  Type of dizziness:  feels as though she's falling   Frequency: 1x  Duration: unable to state as she took rx to go to sleep   Aggravating factors:  when doing a hooklying bridge  Relieving factors: medication  Progression of symptoms: better  VBI: clear B   OCULOMOTOR EXAM:  Ocular Alignment: normal  Ocular ROM: No Limitations  Spontaneous Nystagmus: absent  Gaze-Induced Nystagmus: absent  Smooth Pursuits: intact  Saccades: intact  Convergence/Divergence: <5 cm    VESTIBULAR - OCULAR REFLEX:   Slow VOR: Normal  VOR Cancellation: Normal  Head-Impulse Test: HIT Right: negative HIT Left: negative  Dynamic Visual Acuity: Static: line 11 Dynamic: line 9   POSITIONAL TESTING: Right Roll Test: no nystagmus Left Roll Test: no nystagmus Right Sidelying: no nystagmus Left Sidelying: no nystagmus  MOTION  SENSITIVITY:  Motion Sensitivity Quotient Intensity: 0 = none, 1 = Lightheaded, 2 = Mild, 3 = Moderate, 4 = Severe, 5 = Vomiting  Intensity  1. Sitting to supine 0  2. Supine to L side 0  3. Supine to R side 0  4. Supine to sitting 0  5. L Hallpike-Dix   6. Up from L    7. R Hallpike-Dix   8. Up from R    9. Sitting, head tipped to L knee 0  10. Head up from L knee 1  11. Sitting, head tipped to R knee 0  12. Head up from R knee 0  13. Sitting head turns x5 1  14.Sitting head nods x5 1  15. In stance, 180 turn to L  0  16. In stance, 180 turn to R 1    M-CTSIB:   -condition 1: 30s no sway  -condition 2: 30s slight L anterior sway  -condition 3: 30s no sway  -condition 4: 30s slight L anterior sway   VESTIBULAR TREATMENT:  N/A eval  PATIENT EDUCATION: Education details: PT POC, exam findings Person educated: {Person educated:25204} Education method: {Education Method:25205} Education comprehension: {Education Comprehension:25206}  HOME EXERCISE PROGRAM:  GOALS: Not needed as patient does not require skilled PT services  ASSESSMENT:  CLINICAL IMPRESSION: Patient is a *** y.o. *** who was seen today for physical therapy evaluation and treatment for ***.   OBJECTIVE IMPAIRMENTS: {opptimpairments:25111}.   ACTIVITY LIMITATIONS: {activitylimitations:27494}  PARTICIPATION LIMITATIONS: {participationrestrictions:25113}  PERSONAL FACTORS: {Personal factors:25162} are also affecting patient's functional outcome.   REHAB POTENTIAL: {rehabpotential:25112}  CLINICAL DECISION MAKING: {clinical decision making:25114}  EVALUATION COMPLEXITY: {Evaluation complexity:25115}   PLAN:  PT FREQUENCY: {rehab frequency:25116}  PT DURATION: {rehab duration:25117}  PLANNED INTERVENTIONS: {rehab planned interventions:25118::"Therapeutic exercises","Therapeutic  activity","Neuromuscular re-education","Balance training","Gait training","Patient/Family education","Self Care","Joint mobilization"}  PLAN FOR NEXT SESSION: ***   Debbora Dus, PT Debbora Dus, PT, DPT, CBIS  10/14/2022, 4:19 PM

## 2022-10-19 ENCOUNTER — Encounter: Payer: Self-pay | Admitting: Family Medicine

## 2022-10-28 ENCOUNTER — Other Ambulatory Visit: Payer: Self-pay | Admitting: Family Medicine

## 2022-10-28 ENCOUNTER — Ambulatory Visit (INDEPENDENT_AMBULATORY_CARE_PROVIDER_SITE_OTHER): Payer: 59

## 2022-10-28 DIAGNOSIS — M549 Dorsalgia, unspecified: Secondary | ICD-10-CM

## 2022-10-28 DIAGNOSIS — R079 Chest pain, unspecified: Secondary | ICD-10-CM | POA: Diagnosis not present

## 2022-10-28 DIAGNOSIS — R1030 Lower abdominal pain, unspecified: Secondary | ICD-10-CM

## 2022-10-28 DIAGNOSIS — R0602 Shortness of breath: Secondary | ICD-10-CM | POA: Diagnosis not present

## 2022-10-28 DIAGNOSIS — Z6825 Body mass index (BMI) 25.0-25.9, adult: Secondary | ICD-10-CM | POA: Diagnosis not present

## 2022-10-28 DIAGNOSIS — R3915 Urgency of urination: Secondary | ICD-10-CM | POA: Diagnosis not present

## 2022-10-30 ENCOUNTER — Ambulatory Visit (INDEPENDENT_AMBULATORY_CARE_PROVIDER_SITE_OTHER): Payer: 59

## 2022-10-30 DIAGNOSIS — M549 Dorsalgia, unspecified: Secondary | ICD-10-CM | POA: Diagnosis not present

## 2022-10-30 DIAGNOSIS — R1031 Right lower quadrant pain: Secondary | ICD-10-CM

## 2022-10-30 DIAGNOSIS — K4090 Unilateral inguinal hernia, without obstruction or gangrene, not specified as recurrent: Secondary | ICD-10-CM | POA: Diagnosis not present

## 2022-10-30 DIAGNOSIS — R1030 Lower abdominal pain, unspecified: Secondary | ICD-10-CM

## 2022-10-30 MED ORDER — IOHEXOL 300 MG/ML  SOLN
100.0000 mL | Freq: Once | INTRAMUSCULAR | Status: AC | PRN
  Administered 2022-10-30: 100 mL via INTRAVENOUS

## 2022-11-30 ENCOUNTER — Other Ambulatory Visit (HOSPITAL_BASED_OUTPATIENT_CLINIC_OR_DEPARTMENT_OTHER): Payer: Self-pay | Admitting: Family Medicine

## 2022-11-30 DIAGNOSIS — Z1231 Encounter for screening mammogram for malignant neoplasm of breast: Secondary | ICD-10-CM

## 2022-12-01 ENCOUNTER — Ambulatory Visit (HOSPITAL_BASED_OUTPATIENT_CLINIC_OR_DEPARTMENT_OTHER)
Admission: RE | Admit: 2022-12-01 | Discharge: 2022-12-01 | Disposition: A | Discharge status: Home / Self Care | Payer: 59 | Source: Ambulatory Visit | Attending: Family Medicine | Admitting: Family Medicine

## 2022-12-01 DIAGNOSIS — Z1231 Encounter for screening mammogram for malignant neoplasm of breast: Secondary | ICD-10-CM | POA: Diagnosis not present

## 2022-12-06 ENCOUNTER — Ambulatory Visit
Admission: EM | Admit: 2022-12-06 | Discharge: 2022-12-06 | Disposition: A | Discharge status: Home / Self Care | Payer: 59 | Attending: Family Medicine | Admitting: Family Medicine

## 2022-12-06 ENCOUNTER — Ambulatory Visit: Admit: 2022-12-06 | Payer: 59

## 2022-12-06 ENCOUNTER — Encounter: Payer: Self-pay | Admitting: Emergency Medicine

## 2022-12-06 DIAGNOSIS — N3001 Acute cystitis with hematuria: Secondary | ICD-10-CM

## 2022-12-06 DIAGNOSIS — R350 Frequency of micturition: Secondary | ICD-10-CM | POA: Diagnosis present

## 2022-12-06 LAB — POCT URINALYSIS DIP (MANUAL ENTRY)
Bilirubin, UA: NEGATIVE
Glucose, UA: NEGATIVE mg/dL
Ketones, POC UA: NEGATIVE mg/dL
Nitrite, UA: POSITIVE — AB
Protein Ur, POC: 30 mg/dL — AB
Spec Grav, UA: >=1.03 — AB (ref 1.010–1.025)
Urobilinogen, UA: 0.2 E.U./dL
pH, UA: 5.5 (ref 5.0–8.0)

## 2022-12-06 MED ORDER — NITROFURANTOIN MONOHYD MACRO 100 MG PO CAPS: 100.0000 mg | ORAL_CAPSULE | Freq: Two times a day (BID) | ORAL | 0 refills | Status: DC

## 2022-12-06 NOTE — ED Triage Notes (Signed)
Patient c/o possible UTI, urinary frequency and urgency since today.  Patient denies any OTC pain meds.

## 2022-12-06 NOTE — Discharge Instructions (Signed)
Take antibiotic 2 times a day Continue to drink lots of water  The urine has been sent to the laboratory for culture.  If the culture indicates the need for a change in antibiotics you will be called  Follow-up with your usual doctor

## 2022-12-06 NOTE — ED Provider Notes (Signed)
[facility]    CSN: [id number] Arrival date & time: 12/06/22  1352      History   Chief Complaint Chief Complaint  Patient presents with   Urinary Frequency    HPI Nicole Crosby is a 57 y.o. female.   HPI Here for urinary frequency and some suprapubic discomfort.  Is not prone to urinary tract infections.  No fever or chills.  No nausea or vomiting.  No flank pain or kidney pain.  Chart is reviewed.  She has a number of multiple medical problems, all well-controlled.   Past Medical History:  Diagnosis Date   Allergy    Buspirone, Sulfa, Pantoprazole   Benign fundic gland polyps of stomach 02/12/2016   Chicken pox    Dizziness    Eczema    Functional dyspepsia    GERD (gastroesophageal reflux disease)    H/O adenomatous polyp of colon 10/04/2014   09/2014 - 3 mm adenoma - repeat colonoscopy 2022   Hyperlipidemia    no meds   Hypertension    pt denies   IBS (irritable bowel syndrome)    Insomnia    MEDIAL EPICONDYLITIS, RIGHT 07/17/2010   Nonulcer dyspepsia 02/12/2016   Other abnormal glucose    Tiny hypodensity left hepatic lobe-CT of abdomen and pelvis 3/66/[id number] 44m periumblical hernia   PAC (premature atrial contraction)    Palpitations     Patient Active Problem List   Diagnosis Date Noted   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   Nausea and vomiting 12/25/2019   Abdominal pain 12/25/2019   Constipation 12/25/2019   Change in bowel habit 12/25/2019   Hypokalemia 09/20/2019   Flexural eczema 05/17/2018   Eyelid dermatitis, allergic/contact 05/17/2018   Medial epicondylitis, right 03/17/2018   Ulnar nerve entrapment at elbow, 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   Vitamin D deficiency 11/03/2017   Shortened PR interval 09/27/2017   Greater trochanteric bursitis of left hip 05/12/2017   B12 deficiency 01/22/2017   Memory changes 08/28/2016   Nonulcer dyspepsia 02/12/2016   Benign fundic gland polyps of stomach 02/12/2016   Paresthesia 12/17/2015    Bladder spasm 12/13/2014   H/O adenomatous polyp of colon 10/04/2014   Screening for malignant neoplasm of cervix 05/31/2013   Physical exam 05/31/2013   Left L3 lumbar radiculitis 09/23/2012   Neck pain 08/22/2012   Adrenal nodule ([place] 03/02/2012   Essential hypertension, benign 03/02/2012   IBS (irritable bowel syndrome)    PAC (premature atrial contraction)    Insomnia    GERD (gastroesophageal reflux disease)    DYSTHYMIA 09/30/2009   HEADACHE 09/21/2008   HEPATIC CYST 08/14/2008   Hyperlipidemia 08/06/2008    Past Surgical History:  Procedure Laterality Date   CESAREAN SECTION  01/2001   COLONOSCOPY     DE QUERVAIN'S RELEASE  2000   Right wrist area   ESOPHAGOGASTRODUODENOSCOPY      OB History     Gravida  2   Para  2   Term      Preterm      AB      Living         SAB      IAB      Ectopic      Multiple      Live Births               Home Medications    Prior to Admission medications   Medication Sig Start Date End Date Taking? Authorizing Provider  Calcium Carbonate-Vit D-Min (CALCIUM  1200 PO) Take 1,200 Units by mouth daily.   Yes [provider]  cholecalciferol (VITAMIN D) 1000 UNITS tablet Take 2,000 Units by mouth daily.   Yes [provider]  clindamycin (CLINDAGEL) 1 % gel Apply 1 Application topically as needed.   Yes [provider]  famotidine (PEPCID) 20 MG tablet TAKE 1 TABLET BY MOUTH TWICE DAILY 02/02/22 02/02/23 Yes Gatha Mayer, MD  fluticasone [facility]) 50 MCG/ACT nasal spray Administer 2 sprays in each nostril daily. 09/08/22  Yes   losartan-hydrochlorothiazide (HYZAAR) 50-12.5 MG tablet TAKE 1 TABLET BY MOUTH ONCE DAILY 09/21/22  Yes   meclizine (ANTIVERT) 25 MG tablet Take 25 mg by mouth 2 (two) times daily as needed for dizziness.   Yes [provider]  melatonin 3 MG TABS tablet Take 3 mg by mouth at bedtime as needed.   Yes [provider]  methocarbamol (ROBAXIN) 500 MG  tablet Take 500 mg by mouth every 6 (six) hours as needed. 04/23/20  Yes [provider]  metoprolol succinate (TOPROL XL) 25 MG 24 hr tablet Take 1/2 tablet (12.5 mg total) by mouth at bedtime. 10/13/22  Yes Lelon Perla, MD  metroNIDAZOLE (METROCREAM) 0.75 % cream  02/13/20  Yes [provider]  nitrofurantoin, macrocrystal-monohydrate, (MACROBID) 100 MG capsule Take 1 capsule (100 mg total) by mouth 2 (two) times daily. 12/06/22  Yes Raylene Everts, MD  OSCIMIN 0.125 MG SUBL PLACE 1 TABLET (0.125 MG TOTAL) UNDER THE TONGUE EVERY 4 (FOUR) HOURS AS NEEDED. 12/12/19  Yes Gatha Mayer, MD  Potassium Chloride ER 20 MEQ TBCR Take 1 tablet by mouth 2 (two) times daily with food 09/14/22  Yes   rosuvastatin (CRESTOR) 10 MG tablet Take 1 tablet (10 mg total) by mouth daily. 07/09/22  Yes Lelon Perla, MD  vitamin B-12 (CYANOCOBALAMIN) 500 MCG tablet Take 500 mcg by mouth daily.   Yes [provider]    Family History Family History  Problem Relation Age of Onset   Hypertension Mother    Hyperlipidemia Mother    Fibrocystic breast disease Mother    Hearing loss Mother    Osteopenia Mother    Dementia Mother    Prostate cancer Father 66       deceased   Hypertension Father    Diabetes Father    Kidney disease Father    Heart disease Father        MI   Hyperlipidemia Father    Thyroid disease Sister    Diabetes Sister        pre-diabetes   Hypertension Sister    Arthritis Sister    Diabetes Sister    Hypertension Sister    Hyperlipidemia Sister    Fibrocystic breast disease Sister    Neuropathy Sister    Hyperlipidemia Sister    Hypertension Sister    Other Brother        unknown   Colon cancer Maternal Uncle        died in late 71's   Diabetes Maternal Grandmother    Prostate cancer Maternal Grandfather    COPD Maternal Grandfather    Rectal cancer Neg Hx    Stomach cancer Neg Hx    Esophageal cancer Neg Hx    Pancreatic cancer Neg Hx      Social History Social History   Tobacco Use   Smoking status: Never   Smokeless tobacco: Never  Vaping Use   Vaping Use: Never used  Substance Use  Topics   Alcohol use: Yes    Alcohol/week: 1.0 standard drink of alcohol    Types: 1 Glasses of wine per week    Comment: occasional   Drug use: No     Allergies   Buspirone, Esomeprazole, Ezetimibe, Lansoprazole, Omeprazole, Proton pump inhibitors, and Sulfonamide derivatives   Review of Systems Review of Systems See HPI  Physical Exam Triage Vital Signs ED Triage Vitals  Enc Vitals Group     BP 12/06/22 1442 109/69     Pulse Rate 12/06/22 1442 84     Resp 12/06/22 1442 18     Temp 12/06/22 1442 98.3 F (36.8 C)     Temp Source 12/06/22 1442 Oral     SpO2 12/06/22 1442 99 %     Weight 12/06/22 1443 130 lb 3 oz (59.1 kg)     Height 12/06/22 1443 '5\' 1"'$  (1.549 m)     Head Circumference --      Peak Flow --      Pain Score 12/06/22 1443 4     Pain Loc --      Pain Edu? --      Excl. in [place]? --    No data found.  Updated Vital Signs BP 109/69 (BP Location: Right Arm)   Pulse 84   Temp 98.3 F (36.8 C) (Oral)   Resp 18   Ht '5\' 1"'$  (1.549 m)   Wt 59.1 kg   LMP 11/15/2018   SpO2 99%   BMI 24.60 kg/m       Physical Exam Constitutional:      General: She is not in acute distress.    Appearance: She is well-developed and normal weight.  HENT:     Head: Normocephalic and atraumatic.  Eyes:     Conjunctiva/sclera: Conjunctivae normal.     Pupils: Pupils are equal, round, and reactive to light.  Cardiovascular:     Rate and Rhythm: Normal rate.  Pulmonary:     Effort: Pulmonary effort is normal. No respiratory distress.  Abdominal:     General: There is no distension.     Palpations: Abdomen is soft.     Tenderness: There is no right CVA tenderness or left CVA tenderness.  Musculoskeletal:        General: Normal range of motion.     Cervical back: Normal range of motion.  Skin:    General: Skin is  warm and dry.  Neurological:     Mental Status: She is alert.      UC Treatments / Results  Labs (all labs ordered are listed, but only abnormal results are displayed) Labs Reviewed  POCT URINALYSIS DIP (MANUAL ENTRY) - Abnormal; Notable for the following components:      Result Value   Spec Grav, UA >=1.030 (*)    Blood, UA large (*)    Protein Ur, POC =30 (*)    Nitrite, UA Positive (*)    Leukocytes, UA Small (1+) (*)    All other components within normal limits  URINE CULTURE    EKG   Radiology No results found.  Procedures Procedures (including critical care time)  Medications Ordered in UC Medications - No data to display  Initial Impression / Assessment and Plan / UC Course  I have reviewed the triage vital signs and the nursing notes.  Pertinent labs & imaging results that were available during my care of the patient were reviewed by me and considered in my medical decision making (see chart  for details).     Final Clinical Impressions(s) / UC Diagnoses   Final diagnoses:  Acute cystitis with hematuria     Discharge Instructions      Take antibiotic 2 times a day Continue to drink lots of water  The urine has been sent to the laboratory for culture.  If the culture indicates the need for a change in antibiotics you will be called  Follow-up with your usual doctor   ED Prescriptions     Medication Sig Dispense Auth. Provider   nitrofurantoin, macrocrystal-monohydrate, (MACROBID) 100 MG capsule Take 1 capsule (100 mg total) by mouth 2 (two) times daily. 10 capsule Raylene Everts, MD      PDMP not reviewed this encounter.   Raylene Everts, MD 12/06/22 [phone]

## 2022-12-08 LAB — URINE CULTURE: Culture: >=100000 — AB

## 2022-12-16 ENCOUNTER — Ambulatory Visit: Admit: 2022-12-16 | Discharge status: Absent without leave | Payer: 59

## 2022-12-16 ENCOUNTER — Ambulatory Visit
Admission: EM | Admit: 2022-12-16 | Discharge: 2022-12-16 | Disposition: A | Discharge status: Home / Self Care | Payer: 59 | Attending: Emergency Medicine | Admitting: Emergency Medicine

## 2022-12-16 DIAGNOSIS — N390 Urinary tract infection, site not specified: Secondary | ICD-10-CM

## 2022-12-16 DIAGNOSIS — N3001 Acute cystitis with hematuria: Secondary | ICD-10-CM | POA: Insufficient documentation

## 2022-12-16 LAB — POCT URINALYSIS DIP (MANUAL ENTRY)
Bilirubin, UA: NEGATIVE
Glucose, UA: 100 mg/dL — AB
Nitrite, UA: POSITIVE — AB
Protein Ur, POC: 30 mg/dL — AB
Spec Grav, UA: <=1.005 — AB (ref 1.010–1.025)
Urobilinogen, UA: 4 E.U./dL — AB
pH, UA: 5 (ref 5.0–8.0)

## 2022-12-16 MED ORDER — CEFTRIAXONE SODIUM 1 G IJ SOLR
1000.0000 mg | Freq: Once | INTRAMUSCULAR | Status: AC
  Administered 2022-12-16: 1000 mg via INTRAMUSCULAR

## 2022-12-16 MED ORDER — CIPROFLOXACIN HCL 250 MG PO TABS
250.0000 mg | ORAL_TABLET | Freq: Two times a day (BID) | ORAL | 0 refills | Status: DC
  Filled 2022-12-16: qty 10, 5d supply, fill #0

## 2022-12-16 MED ORDER — CIPROFLOXACIN HCL 500 MG PO TABS
500.0000 mg | ORAL_TABLET | Freq: Two times a day (BID) | ORAL | 0 refills | Status: AC
  Filled 2022-12-16: qty 10, 5d supply, fill #0

## 2022-12-16 NOTE — ED Triage Notes (Addendum)
Pt c/o urinary frequency and dysuria that started earlier today. Was seen for UTI on 2/4. Rx'd 5 days worth of abx. Sxs returned today.  Took AZO 45 mins before coming to UC.

## 2022-12-16 NOTE — ED Provider Notes (Signed)
[name]    CSN: [id number] Arrival date & time:       HISTORY   Chief Complaint  Patient presents with  . Urinary Frequency    Urinary urgency, burning, chills, nausea. - Entered by patient   HPI Nicole Crosby is a pleasant, 57 y.o. female who presents to urgent care today. Patient c/o burning with urination, increased frequency of urination, nausea, and chills.  Patient denies {lmutisx:28097}.  {lmutisx:28097}    Urinary Frequency  Past Medical History:  Diagnosis Date  . Allergy    Buspirone, Sulfa, Pantoprazole  . Benign fundic gland polyps of stomach 02/12/2016  . Chicken pox   . Dizziness   . Eczema   . Functional dyspepsia   . GERD (gastroesophageal reflux disease)   . H/O adenomatous polyp of colon 10/04/2014   09/2014 - 3 mm adenoma - repeat colonoscopy 2022  . Hyperlipidemia    no meds  . Hypertension    pt denies  . IBS (irritable bowel syndrome)   . Insomnia   . MEDIAL EPICONDYLITIS, RIGHT 07/17/2010  . 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 Other abnormal glucose    Tiny hypodensity left hepatic lobe-CT of abdomen and pelvis [id number] 75m periumblical hernia  . PAC (premature atrial contraction)   . Palpitations    Patient Active Problem List   Diagnosis Date Noted  . 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 Nausea and vomiting 12/25/2019  . Abdominal pain 12/25/2019  . Constipation 12/25/2019  . Change in bowel habit 12/25/2019  . Hypokalemia 09/20/2019  . Flexural eczema 05/17/2018  . Eyelid dermatitis, allergic/contact 05/17/2018  . Medial epicondylitis, right 03/17/2018  . 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 Vitamin D deficiency 11/03/2017  . Shortened PR interval 09/27/2017  . Greater trochanteric bursitis of left hip 05/12/2017  . B12 deficiency 01/22/2017  . Memory changes 08/28/2016  . 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 Benign fundic gland polyps of stomach 02/12/2016  . Paresthesia 12/17/2015   . Bladder spasm 12/13/2014  . H/O adenomatous polyp of colon 10/04/2014  . Screening for malignant neoplasm of cervix 05/31/2013  . Physical exam 05/31/2013  . Left L3 lumbar radiculitis 09/23/2012  . Neck pain 08/22/2012  . Adrenal nodule ([place] 03/02/2012  . Essential hypertension, benign 03/02/2012  . IBS (irritable bowel syndrome)   . PAC (premature atrial contraction)   . Insomnia   . GERD (gastroesophageal reflux disease)   . DYSTHYMIA 09/30/2009  . HEADACHE 09/21/2008  . HEPATIC CYST 08/14/2008  . Hyperlipidemia 08/06/2008   Past Surgical History:  Procedure Laterality Date  . CESAREAN SECTION  01/2001  . COLONOSCOPY    . DE QUERVAIN'S RELEASE  2000   Right wrist area  . ESOPHAGOGASTRODUODENOSCOPY     OB History     Gravida  2   Para  2   Term      Preterm      AB      Living         SAB      IAB      Ectopic      Multiple      Live Births             Home Medications    Prior to Admission medications   Medication Sig Start Date End Date Taking? Authorizing Provider  Calcium Carbonate-Vit D-Min (CALCIUM 1200 PO) Take 1,200 Units by mouth daily.    [provider]  cholecalciferol (VITAMIN D) 1000 UNITS  tablet Take 2,000 Units by mouth daily.    [provider]  clindamycin (CLINDAGEL) 1 % gel Apply 1 Application topically as needed.    [provider]  famotidine (PEPCID) 20 MG tablet TAKE 1 TABLET BY MOUTH TWICE DAILY 02/02/22 02/02/23  Gatha Mayer, MD  fluticasone [facility]) 50 MCG/ACT nasal spray Administer 2 sprays in each nostril daily. 09/08/22     losartan-hydrochlorothiazide (HYZAAR) 50-12.5 MG tablet TAKE 1 TABLET BY MOUTH ONCE DAILY 09/21/22     meclizine (ANTIVERT) 25 MG tablet Take 25 mg by mouth 2 (two) times daily as needed for dizziness.    [provider]  melatonin 3 MG TABS tablet Take 3 mg by mouth at bedtime as needed.    [provider]  methocarbamol (ROBAXIN) 500 MG tablet Take  500 mg by mouth every 6 (six) hours as needed. 04/23/20   [provider]  metoprolol succinate (TOPROL XL) 25 MG 24 hr tablet Take 1/2 tablet (12.5 mg total) by mouth at bedtime. 10/13/22   Lelon Perla, MD  metroNIDAZOLE (METROCREAM) 0.75 % cream  02/13/20   [provider]  nitrofurantoin, macrocrystal-monohydrate, (MACROBID) 100 MG capsule Take 1 capsule (100 mg total) by mouth 2 (two) times daily. 12/06/22   Raylene Everts, MD  OSCIMIN 0.125 MG SUBL PLACE 1 TABLET (0.125 MG TOTAL) UNDER THE TONGUE EVERY 4 (FOUR) HOURS AS NEEDED. 12/12/19   Gatha Mayer, MD  Potassium Chloride ER 20 MEQ TBCR Take 1 tablet by mouth 2 (two) times daily with food 09/14/22     rosuvastatin (CRESTOR) 10 MG tablet Take 1 tablet (10 mg total) by mouth daily. 07/09/22   Lelon Perla, MD  vitamin B-12 (CYANOCOBALAMIN) 500 MCG tablet Take 500 mcg by mouth daily.    [provider]    Family History Family History  Problem Relation Age of Onset  . Hypertension Mother   . Hyperlipidemia Mother   . Fibrocystic breast disease Mother   . Hearing loss Mother   . Osteopenia Mother   . Dementia Mother   . Prostate cancer Father 33       deceased  . Hypertension Father   . Diabetes Father   . Kidney disease Father   . Heart disease Father        MI  . Hyperlipidemia Father   . Thyroid disease Sister   . Diabetes Sister        pre-diabetes  . Hypertension Sister   . Arthritis Sister   . Diabetes Sister   . Hypertension Sister   . Hyperlipidemia Sister   . Fibrocystic breast disease Sister   . Neuropathy Sister   . Hyperlipidemia Sister   . Hypertension Sister   . Other Brother        unknown  . Colon cancer Maternal Uncle        died in late 30's  . Diabetes Maternal Grandmother   . Prostate cancer Maternal Grandfather   . COPD Maternal Grandfather   . Rectal cancer Neg Hx   . Stomach cancer Neg Hx   . Esophageal cancer Neg Hx   . Pancreatic cancer Neg Hx     Social History Social History   Tobacco Use  . Smoking status: Never  . Smokeless tobacco: Never  Vaping Use  . Vaping Use: Never used  Substance Use Topics  . Alcohol use: Yes    Alcohol/week: 1.0 standard drink of alcohol    Types: 1 Glasses  of wine per week    Comment: occasional  . Drug use: No   Allergies   Buspirone, Esomeprazole, Ezetimibe, Lansoprazole, Omeprazole, Proton pump inhibitors, and Sulfonamide derivatives  Review of Systems Review of Systems  Genitourinary:  Positive for frequency.   Pertinent findings revealed after performing a 14 point review of systems has been noted in the history of present illness.  Physical Exam Vital Signs LMP 11/15/2018   No data found.  Physical Exam  Visual Acuity Right Eye Distance:   Left Eye Distance:   Bilateral Distance:    Right Eye Near:   Left Eye Near:    Bilateral Near:     UC Couse / Diagnostics / Procedures:     Radiology No results found.  Procedures Procedures (including critical care time) EKG  Pending results:  Labs Reviewed - No data to display  Medications Ordered in UC: Medications - No data to display  UC Diagnoses / Final Clinical Impressions(s)   I have reviewed the triage vital signs and the nursing notes.  Pertinent labs & imaging results that were available during my care of the patient were reviewed by me and considered in my medical decision making (see chart for details).    Final diagnoses:  None   {LMSTDP:27058}  {LMUTIP:27060}  Please see discharge instructions below for details of plan of care as provided to patient. ED Prescriptions   None    PDMP not reviewed this encounter.  Disposition Upon Discharge:  Condition: stable for discharge home  Patient presented with concern for an acute illness with associated systemic symptoms and significant discomfort requiring urgent management. In my opinion, this is a condition that a prudent lay person (someone who  possesses an average knowledge of health and medicine) may potentially expect to result in complications if not addressed urgently such as respiratory distress, impairment of bodily function or dysfunction of bodily organs.   As such, the patient has been evaluated and assessed, work-up was performed and treatment was provided in alignment with urgent care protocols and evidence based medicine.  Patient/parent/caregiver has been advised that the patient may require follow up for further testing and/or treatment if the symptoms continue in spite of treatment, as clinically indicated and appropriate.  Routine symptom specific, illness specific and/or disease specific instructions were discussed with the patient and/or caregiver at length.  Prevention strategies for avoiding STD exposure were also discussed.  The patient will follow up with their current PCP if and as advised. If the patient does not currently have a PCP we will assist them in obtaining one.   The patient may need specialty follow up if the symptoms continue, in spite of conservative treatment and management, for further workup, evaluation, consultation and treatment as clinically indicated and appropriate.  Patient/parent/caregiver verbalized understanding and agreement of plan as discussed.  All questions were addressed during visit.  Please see discharge instructions below for further details of plan.  Discharge Instructions: Discharge Instructions   None     This office note has been dictated using Dragon speech recognition software.  Unfortunately, this method of dictation can sometimes lead to typographical or grammatical errors.  I apologize for your inconvenience in advance if this occurs.  Please do not hesitate to reach out to me if clarification is needed.

## 2022-12-16 NOTE — Discharge Instructions (Addendum)
Common causes of urinary tract infections include but are not limited to holding your urine longer than you should, squatting instead of sitting down when urinating, sitting around in wet clothing such as a wet swimsuit or gym clothes too long, not emptying your bladder after having sexual intercourse, wiping from back to front instead of front to back after having a bowel movement.     Less common causes of urinary tract infections include but are not limited to anatomical shifts in the location of your bladder or uterus causing obstruction of passage of urine from your bladder to your urethra where your urine comes out or prolapse of your rectum into your vaginal wall.  These less common causes can be evaluated by gynecologist, a urologist or subspecialist called a uro-gynecologist   The urinalysis that we performed in the clinic today was abnormal.  Urine culture will be performed per our protocol.  The result of the urine culture will be available in the next 3 to 5 days and will be posted to your MyChart account.  If there is an abnormal finding, you will be contacted by phone and advised of further treatment recommendations, if any.   You were advised to begin antibiotics today because your urinalysis is abnormal and you are having active symptoms of an acute lower urinary tract infection also known as cystitis.   You received an injection of a strong antibiotic called ceftriaxone during your today to help expedite resolution of your urinary tract infection.   Please pick up and begin taking your prescription for Ciprofloxacin 500 mg as soon as possible.  Please take all doses exactly as prescribed.  You can take this medication with or without food.  This medication is safe to take with your other medications.   If you receive a phone call advising you that your urine culture is negative but you begin to feel better after taking antibiotics for 24 hours, please feel free to complete the full course  of antibiotics as they were likely needed and the urine culture result was false.  If your culture is negative and you do not feel any better, please return for repeat evaluation of other possible causes of your symptoms.  The results of your vaginal swab test which screens for BV, yeast, will be made posted to your MyChart account once it is complete.  This typically takes 2 to 4 days.  If any of your results are abnormal, you will receive a phone call regarding treatment.  Prescriptions, if any are needed, will be provided for you at your pharmacy.      If you have not had complete resolution of your symptoms after completing treatment as prescribed, please return to urgent care for repeat evaluation or follow-up with your primary care provider. Thank you for visiting urgent care today.  I appreciate the opportunity to participate in your care.

## 2022-12-16 NOTE — ED Provider Notes (Signed)
[name]    CSN: [id number] Arrival date & time: 12/16/22  [place]    HISTORY   Chief Complaint  Patient presents with   Urinary Frequency   Dysuria   HPI Nicole Crosby is a pleasant, 57 y.o. female who presents to urgent care today. Patient complains of burning with urination and increased frequency of urination for the past 1 day.  Patient denies abnormal odor of urine, blood in urine, suprapubic pain, perineal pain, incontinence of urine, flank pain, fever, chills, malaise, rigors, significant fatigue, and abnormal vaginal discharge.  EMR reviewed, patient was recently diagnosed with urinary tract infection on December 06, 2022, was provided with a 5-day course of Macrobid.  Patient states her symptoms resolved after completing Macrobid then began again yesterday.  Patient states the burning with urination occurs at the very end of void..    The history is provided by the patient.   Past Medical History:  Diagnosis Date   Allergy    Buspirone, Sulfa, Pantoprazole   Benign fundic gland polyps of stomach 02/12/2016   Chicken pox    Dizziness    Eczema    Functional dyspepsia    GERD (gastroesophageal reflux disease)    H/O adenomatous polyp of colon 10/04/2014   09/2014 - 3 mm adenoma - repeat colonoscopy 2022   Hyperlipidemia    no meds   Hypertension    pt denies   IBS (irritable bowel syndrome)    Insomnia    MEDIAL EPICONDYLITIS, RIGHT 07/17/2010   Nonulcer dyspepsia 02/12/2016   Other abnormal glucose    Tiny hypodensity left hepatic lobe-CT of abdomen and pelvis [id number] periumblical hernia   PAC (premature atrial contraction)    Palpitations    Patient Active Problem List   Diagnosis Date Noted   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   Nausea and vomiting 12/25/2019   Abdominal pain 12/25/2019   Constipation 12/25/2019   Change in bowel habit 12/25/2019   Hypokalemia 09/20/2019   Flexural eczema 05/17/2018   Eyelid dermatitis,  allergic/contact 05/17/2018   Medial epicondylitis, right 03/17/2018   Ulnar nerve entrapment at elbow, 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   Vitamin D deficiency 11/03/2017   Shortened PR interval 09/27/2017   Greater trochanteric bursitis of left hip 05/12/2017   B12 deficiency 01/22/2017   Memory changes 08/28/2016   Nonulcer dyspepsia 02/12/2016   Benign fundic gland polyps of stomach 02/12/2016   Paresthesia 12/17/2015   Bladder spasm 12/13/2014   H/O adenomatous polyp of colon 10/04/2014   Screening for malignant neoplasm of cervix 05/31/2013   Physical exam 05/31/2013   Left L3 lumbar radiculitis 09/23/2012   Neck pain 08/22/2012   Adrenal nodule ([place] 03/02/2012   Essential hypertension, benign 03/02/2012   IBS (irritable bowel syndrome)    PAC (premature atrial contraction)    Insomnia    GERD (gastroesophageal reflux disease)    DYSTHYMIA 09/30/2009   HEADACHE 09/21/2008   HEPATIC CYST 08/14/2008   Hyperlipidemia 08/06/2008   Past Surgical History:  Procedure Laterality Date   CESAREAN SECTION  01/2001   COLONOSCOPY     DE QUERVAIN'S RELEASE  2000   Right wrist area   ESOPHAGOGASTRODUODENOSCOPY     OB History     Gravida  2   Para  2   Term      Preterm      AB      Living         SAB      IAB  Ectopic      Multiple      Live Births             Home Medications    Prior to Admission medications   Medication Sig Start Date End Date Taking? Authorizing Provider  Calcium Carbonate-Vit D-Min (CALCIUM 1200 PO) Take 1,200 Units by mouth daily.    [provider]  cholecalciferol (VITAMIN D) 1000 UNITS tablet Take 2,000 Units by mouth daily.    [provider]  ciprofloxacin (CIPRO) 500 MG tablet Take 1 tablet (500 mg total) by mouth 2 (two) times daily for 5 days. 12/16/22 12/21/22  Lynden Oxford Scales, PA-C  clindamycin (CLINDAGEL) 1 % gel Apply 1 Application topically as needed.    [provider]  famotidine (PEPCID) 20 MG  tablet TAKE 1 TABLET BY MOUTH TWICE DAILY 02/02/22 02/02/23  Gatha Mayer, MD  fluticasone [facility]) 50 MCG/ACT nasal spray Administer 2 sprays in each nostril daily. 09/08/22     losartan-hydrochlorothiazide (HYZAAR) 50-12.5 MG tablet TAKE 1 TABLET BY MOUTH ONCE DAILY 09/21/22     meclizine (ANTIVERT) 25 MG tablet Take 25 mg by mouth 2 (two) times daily as needed for dizziness.    [provider]  melatonin 3 MG TABS tablet Take 3 mg by mouth at bedtime as needed.    [provider]  methocarbamol (ROBAXIN) 500 MG tablet Take 500 mg by mouth every 6 (six) hours as needed. 04/23/20   [provider]  metoprolol succinate (TOPROL XL) 25 MG 24 hr tablet Take 1/2 tablet (12.5 mg total) by mouth at bedtime. 10/13/22   Lelon Perla, MD  metroNIDAZOLE (METROCREAM) 0.75 % cream  02/13/20   [provider]  nitrofurantoin, macrocrystal-monohydrate, (MACROBID) 100 MG capsule Take 1 capsule (100 mg total) by mouth 2 (two) times daily. 12/06/22   Raylene Everts, MD  OSCIMIN 0.125 MG SUBL PLACE 1 TABLET (0.125 MG TOTAL) UNDER THE TONGUE EVERY 4 (FOUR) HOURS AS NEEDED. 12/12/19   Gatha Mayer, MD  Potassium Chloride ER 20 MEQ TBCR Take 1 tablet by mouth 2 (two) times daily with food 09/14/22     rosuvastatin (CRESTOR) 10 MG tablet Take 1 tablet (10 mg total) by mouth daily. 07/09/22   Lelon Perla, MD  vitamin B-12 (CYANOCOBALAMIN) 500 MCG tablet Take 500 mcg by mouth daily.    [provider]    Family History Family History  Problem Relation Age of Onset   Hypertension Mother    Hyperlipidemia Mother    Fibrocystic breast disease Mother    Hearing loss Mother    Osteopenia Mother    Dementia Mother    Prostate cancer Father 73       deceased   Hypertension Father    Diabetes Father    Kidney disease Father    Heart disease Father        MI   Hyperlipidemia Father    Thyroid disease Sister    Diabetes Sister        pre-diabetes   Hypertension  Sister    Arthritis Sister    Diabetes Sister    Hypertension Sister    Hyperlipidemia Sister    Fibrocystic breast disease Sister    Neuropathy Sister    Hyperlipidemia Sister    Hypertension Sister    Other Brother        unknown   Colon cancer Maternal Uncle        died in late 60's  Diabetes Maternal Grandmother    Prostate cancer Maternal Grandfather    COPD Maternal Grandfather    Rectal cancer Neg Hx    Stomach cancer Neg Hx    Esophageal cancer Neg Hx    Pancreatic cancer Neg Hx    Social History Social History   Tobacco Use   Smoking status: Never   Smokeless tobacco: Never  Vaping Use   Vaping Use: Never used  Substance Use Topics   Alcohol use: Yes    Alcohol/week: 1.0 standard drink of alcohol    Types: 1 Glasses of wine per week    Comment: occasional   Drug use: No   Allergies   Buspirone, Esomeprazole, Ezetimibe, Lansoprazole, Omeprazole, Proton pump inhibitors, and Sulfonamide derivatives  Review of Systems Review of Systems Pertinent findings revealed after performing a 14 point review of systems has been noted in the history of present illness.  Physical Exam Vital Signs BP (!) 152/81 (BP Location: Right Arm)   Pulse 82   Temp 98.1 F (36.7 C) (Oral)   Resp 18   LMP 11/15/2018   SpO2 100%   No data found.  Physical Exam Vitals and nursing note reviewed.  Constitutional:      General: She is not in acute distress.    Appearance: Normal appearance. She is not ill-appearing.  HENT:     Head: Normocephalic and atraumatic.  Eyes:     General: Lids are normal.        Right eye: No discharge.        Left eye: No discharge.     Extraocular Movements: Extraocular movements intact.     Conjunctiva/sclera: Conjunctivae normal.     Right eye: Right conjunctiva is not injected.     Left eye: Left conjunctiva is not injected.  Neck:     Trachea: Trachea and phonation normal.  Cardiovascular:     Rate and Rhythm: Normal rate and regular  rhythm.     Pulses: Normal pulses.     Heart sounds: Normal heart sounds. No murmur heard.    No friction rub. No gallop.  Pulmonary:     Effort: Pulmonary effort is normal. No accessory muscle usage, prolonged expiration or respiratory distress.     Breath sounds: Normal breath sounds. No stridor, decreased air movement or transmitted upper airway sounds. No decreased breath sounds, wheezing, rhonchi or rales.  Chest:     Chest wall: No tenderness.  Abdominal:     General: Abdomen is flat. Bowel sounds are normal. There is no distension.     Palpations: Abdomen is soft.     Tenderness: There is no abdominal tenderness. There is no right CVA tenderness or left CVA tenderness.     Hernia: No hernia is present.  Musculoskeletal:        General: Normal range of motion.     Cervical back: Normal range of motion and neck supple. Normal range of motion.  Lymphadenopathy:     Cervical: No cervical adenopathy.  Skin:    General: Skin is warm and dry.     Findings: No erythema or rash.  Neurological:     General: No focal deficit present.     Mental Status: She is alert and oriented to person, place, and time.  Psychiatric:        Mood and Affect: Mood normal.        Behavior: Behavior normal.     Visual Acuity Right Eye Distance:   Left Eye Distance:  Bilateral Distance:    Right Eye Near:   Left Eye Near:    Bilateral Near:     UC Couse / Diagnostics / Procedures:     Radiology No results found.  Procedures Procedures (including critical care time) EKG  Pending results:  Labs Reviewed  POCT URINALYSIS DIP (MANUAL ENTRY) - Abnormal; Notable for the following components:      Result Value   Color, UA orange (*)    Glucose, UA =100 (*)    Ketones, POC UA trace (5) (*)    Spec Grav, UA <=1.005 (*)    Blood, UA moderate (*)    Protein Ur, POC =30 (*)    Urobilinogen, UA 4.0 (*)    Nitrite, UA Positive (*)    Leukocytes, UA Large (3+) (*)    All other components  within normal limits  URINE CULTURE  CERVICOVAGINAL ANCILLARY ONLY    Medications Ordered in UC: Medications  cefTRIAXone (ROCEPHIN) injection 1,000 mg (1,000 mg Intramuscular Given 12/16/22 1938)    UC Diagnoses / Final Clinical Impressions(s)   I have reviewed the triage vital signs and the nursing notes.  Pertinent labs & imaging results that were available during my care of the patient were reviewed by me and considered in my medical decision making (see chart for details).    Final diagnoses:  Acute cystitis with hematuria  Recurrent UTI (urinary tract infection)   Yeast and BV screening was performed, patient advised that the results be posted to their MyChart and if any of the results are positive, they will be notified by phone, further treatment will be provided as indicated based on results of yeast and BV screening. Urinalysis today was abnormal due to patient taking Azo.  Patient was provided with an injection of ceftriaxone 1 g for presumed recurrent urinary tract infection. Patient was advised to begin antibiotics today due to having active symptoms of urinary tract infection.                    Patient was advised to take all doses exactly as prescribed.  Patient also advised of risks of worsening infection with incomplete antibiotic therapy. Patient advised that they will be contacted with results and that adjustments to treatment will be provided as indicated based on the results.   Patient was advised of possibility that urine culture results may be negative if sample provided was obtained late in the day causing urine to be more diluted.  Patient was advised that if antibiotics were effective after the first 24 to 36 hours, despite negative urine culture result, it is recommended that they complete the full course as prescribed.   Return precautions advised.  Please see discharge instructions below for details of plan of care as provided to patient. ED Prescriptions      Medication Sig Dispense Auth. Provider   ciprofloxacin (CIPRO) 250 MG tablet  (Status: Discontinued) Take 1 tablet (250 mg total) by mouth 2 (two) times daily for 5 days. 10 tablet Lynden Oxford Scales, PA-C   ciprofloxacin (CIPRO) 500 MG tablet Take 1 tablet (500 mg total) by mouth 2 (two) times daily for 5 days. 10 tablet Lynden Oxford Scales, PA-C      PDMP not reviewed this encounter.  Disposition Upon Discharge:  Condition: stable for discharge home  Patient presented with concern for an acute illness with associated systemic symptoms and significant discomfort requiring urgent management. In my opinion, this is a condition that a prudent lay person (  someone who possesses an average knowledge of health and medicine) may potentially expect to result in complications if not addressed urgently such as respiratory distress, impairment of bodily function or dysfunction of bodily organs.   As such, the patient has been evaluated and assessed, work-up was performed and treatment was provided in alignment with urgent care protocols and evidence based medicine.  Patient/parent/caregiver has been advised that the patient may require follow up for further testing and/or treatment if the symptoms continue in spite of treatment, as clinically indicated and appropriate.  Routine symptom specific, illness specific and/or disease specific instructions were discussed with the patient and/or caregiver at length.  Prevention strategies for avoiding STD exposure were also discussed.  The patient will follow up with their current PCP if and as advised. If the patient does not currently have a PCP we will assist them in obtaining one.   The patient may need specialty follow up if the symptoms continue, in spite of conservative treatment and management, for further workup, evaluation, consultation and treatment as clinically indicated and appropriate.  Patient/parent/caregiver verbalized understanding  and agreement of plan as discussed.  All questions were addressed during visit.  Please see discharge instructions below for further details of plan.  Discharge Instructions:   Discharge Instructions      Common causes of urinary tract infections include but are not limited to holding your urine longer than you should, squatting instead of sitting down when urinating, sitting around in wet clothing such as a wet swimsuit or gym clothes too long, not emptying your bladder after having sexual intercourse, wiping from back to front instead of front to back after having a bowel movement.     Less common causes of urinary tract infections include but are not limited to anatomical shifts in the location of your bladder or uterus causing obstruction of passage of urine from your bladder to your urethra where your urine comes out or prolapse of your rectum into your vaginal wall.  These less common causes can be evaluated by gynecologist, a urologist or subspecialist called a uro-gynecologist   The urinalysis that we performed in the clinic today was abnormal.  Urine culture will be performed per our protocol.  The result of the urine culture will be available in the next 3 to 5 days and will be posted to your MyChart account.  If there is an abnormal finding, you will be contacted by phone and advised of further treatment recommendations, if any.   You were advised to begin antibiotics today because your urinalysis is abnormal and you are having active symptoms of an acute lower urinary tract infection also known as cystitis.   You received an injection of a strong antibiotic called ceftriaxone during your today to help expedite resolution of your urinary tract infection.   Please pick up and begin taking your prescription for Ciprofloxacin 500 mg as soon as possible.  Please take all doses exactly as prescribed.  You can take this medication with or without food.  This medication is safe to take with your  other medications.   If you receive a phone call advising you that your urine culture is negative but you begin to feel better after taking antibiotics for 24 hours, please feel free to complete the full course of antibiotics as they were likely needed and the urine culture result was false.  If your culture is negative and you do not feel any better, please return for repeat evaluation of other possible  causes of your symptoms.  The results of your vaginal swab test which screens for BV, yeast, will be made posted to your MyChart account once it is complete.  This typically takes 2 to 4 days.  If any of your results are abnormal, you will receive a phone call regarding treatment.  Prescriptions, if any are needed, will be provided for you at your pharmacy.      If you have not had complete resolution of your symptoms after completing treatment as prescribed, please return to urgent care for repeat evaluation or follow-up with your primary care provider. Thank you for visiting urgent care today.  I appreciate the opportunity to participate in your care.     This office note has been dictated using Museum/gallery curator.  Unfortunately, this method of dictation can sometimes lead to typographical or grammatical errors.  I apologize for your inconvenience in advance if this occurs.  Please do not hesitate to reach out to me if clarification is needed.       Lynden Oxford Scales, PA-C 12/16/22 2000    Lynden Oxford Scales, PA-C 12/17/22 [phone]

## 2022-12-17 ENCOUNTER — Other Ambulatory Visit (HOSPITAL_BASED_OUTPATIENT_CLINIC_OR_DEPARTMENT_OTHER): Payer: Self-pay

## 2022-12-18 ENCOUNTER — Telehealth: Payer: Self-pay | Admitting: Emergency Medicine

## 2022-12-18 LAB — CERVICOVAGINAL ANCILLARY ONLY
Bacterial Vaginitis (gardnerella): NEGATIVE
Candida Glabrata: NEGATIVE
Candida Vaginitis: NEGATIVE
Comment: NEGATIVE
Comment: NEGATIVE
Comment: NEGATIVE

## 2022-12-18 LAB — URINE CULTURE: Culture: NO GROWTH

## 2022-12-18 NOTE — Telephone Encounter (Signed)
Call from [place] to see if she could stop taking the oral antibiotic prescribed - pt stated it made her feel terrible. Pt stated she took it last night 2 hours after eating. This RN  instructed pt to take immediately after eating and not to wait 2 hours. If pt is still having problems tomorrow, RN asked her to call  back to [facility]. Pt verbalized an understanding

## 2023-01-13 ENCOUNTER — Other Ambulatory Visit (HOSPITAL_BASED_OUTPATIENT_CLINIC_OR_DEPARTMENT_OTHER): Payer: Self-pay

## 2023-02-12 DIAGNOSIS — D2272 Melanocytic nevi of left lower limb, including hip: Secondary | ICD-10-CM | POA: Diagnosis not present

## 2023-02-12 DIAGNOSIS — Z08 Encounter for follow-up examination after completed treatment for malignant neoplasm: Secondary | ICD-10-CM | POA: Diagnosis not present

## 2023-02-12 DIAGNOSIS — L821 Other seborrheic keratosis: Secondary | ICD-10-CM | POA: Diagnosis not present

## 2023-02-12 DIAGNOSIS — L814 Other melanin hyperpigmentation: Secondary | ICD-10-CM | POA: Diagnosis not present

## 2023-02-12 DIAGNOSIS — Z85828 Personal history of other malignant neoplasm of skin: Secondary | ICD-10-CM | POA: Diagnosis not present

## 2023-02-17 ENCOUNTER — Other Ambulatory Visit: Payer: Self-pay | Admitting: Internal Medicine

## 2023-02-18 ENCOUNTER — Other Ambulatory Visit (HOSPITAL_BASED_OUTPATIENT_CLINIC_OR_DEPARTMENT_OTHER): Payer: Self-pay

## 2023-02-19 ENCOUNTER — Other Ambulatory Visit (HOSPITAL_BASED_OUTPATIENT_CLINIC_OR_DEPARTMENT_OTHER): Payer: Self-pay

## 2023-02-23 ENCOUNTER — Other Ambulatory Visit (HOSPITAL_BASED_OUTPATIENT_CLINIC_OR_DEPARTMENT_OTHER): Payer: Self-pay

## 2023-03-11 NOTE — Progress Notes (Signed)
[facility] [address] [place] [address] Phone: [phone] Subjective:   INadine Counts, am serving as a scribe for Dr. Antoine Primas.  I'm seeing this patient by the request  of:  Sigmund Hazel, MD  CC: Left leg pain  [id number]  Nicole Crosby is a 57 y.o. female coming in with complaint of L leg pain.  Patient has had signs and symptoms consistent with more of a left L3 nerve impingement as well as greater trochanteric bursitis.  Patient unfortunately did have to be seen in February for a UTI that was fairly significant but needed multiple visits to urgent care.  L hip pain, low back pain having some left leg weakness, numbness and tingling. Neck pain as well. Naproxen helps. Notice some weakness in left arm. Everything has gotten progressively worse.     Past Medical History:  Diagnosis Date   Allergy    Buspirone, Sulfa, Pantoprazole   Benign fundic gland polyps of stomach 02/12/2016   Chicken pox    Dizziness    Eczema    Functional dyspepsia    GERD (gastroesophageal reflux disease)    H/O adenomatous polyp of colon 10/04/2014   09/2014 - 3 mm adenoma - repeat colonoscopy 2022   Hyperlipidemia    no meds   Hypertension    pt denies   IBS (irritable bowel syndrome)    Insomnia    MEDIAL EPICONDYLITIS, RIGHT 07/17/2010   Nonulcer dyspepsia 02/12/2016   Other abnormal glucose    Tiny hypodensity left hepatic lobe-CT of abdomen and pelvis 07/26/2008-small 8mm periumblical hernia   PAC (premature atrial contraction)    Palpitations    Past Surgical History:  Procedure Laterality Date   CESAREAN SECTION  01/2001   COLONOSCOPY     DE QUERVAIN'S RELEASE  2000   Right wrist area   ESOPHAGOGASTRODUODENOSCOPY     Social History   Socioeconomic History   Marital status: Married    Spouse name: Not on file   Number of children: 2   Years of education: Not on file   Highest education level: Not on file  Occupational  History   Occupation: Xray/CT Theme park manager: [employer]  Tobacco Use   Smoking status: Never   Smokeless tobacco: Never  Vaping Use   Vaping Use: Never used  Substance and Sexual Activity   Alcohol use: Yes    Alcohol/week: 1.0 standard drink of alcohol    Types: 1 Glasses of wine per week    Comment: occasional   Drug use: No   Sexual activity: Yes    Birth control/protection: Inserts  Other Topics Concern   Not on file  Social History Narrative   Occupation: Engineer, 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    Alcohol Use - yes-wine         Full Time    Married           Social Determinants of Health   Financial Resource Strain: Not on file  Food Insecurity: Not on file  Transportation Needs: Not on file  Physical Activity: Not on file  Stress: Not on file  Social Connections: Not on file   Allergies  Allergen Reactions   Buspirone Other (See Comments)     nightmare   Esomeprazole Other (See Comments)    headache   Ezetimibe     Abdominal pain, diarrhea   Lansoprazole     Abdominal pain  Omeprazole     Headache   Proton Pump Inhibitors    Sulfonamide Derivatives Rash     Rash   Family History  Problem Relation Age of Onset   Hypertension Mother    Hyperlipidemia Mother    Fibrocystic breast disease Mother    Hearing loss Mother    Osteopenia Mother    Dementia Mother    Prostate cancer Father 40       deceased   Hypertension Father    Diabetes Father    Kidney disease Father    Heart disease Father        MI   Hyperlipidemia Father    Thyroid disease Sister    Diabetes Sister        pre-diabetes   Hypertension Sister    Arthritis Sister    Diabetes Sister    Hypertension Sister    Hyperlipidemia Sister    Fibrocystic breast disease Sister    Neuropathy Sister    Hyperlipidemia Sister    Hypertension Sister    Other Brother        unknown   Colon cancer Maternal Uncle        died in late 1's   Diabetes Maternal Grandmother     Prostate cancer Maternal Grandfather    COPD Maternal Grandfather    Rectal cancer Neg Hx    Stomach cancer Neg Hx    Esophageal cancer Neg Hx    Pancreatic cancer Neg Hx     Current Outpatient Medications (Endocrine & Metabolic):    predniSONE (DELTASONE) 20 MG tablet, Take 1 tablet (20 mg total) by mouth daily with breakfast.  Current Outpatient Medications (Cardiovascular):    losartan-hydrochlorothiazide (HYZAAR) 50-12.5 MG tablet, TAKE 1 TABLET BY MOUTH ONCE DAILY   metoprolol succinate (TOPROL XL) 25 MG 24 hr tablet, Take 1/2 tablet (12.5 mg total) by mouth at bedtime.   rosuvastatin (CRESTOR) 10 MG tablet, Take 1 tablet (10 mg total) by mouth daily.  Current Outpatient Medications (Respiratory):    fluticasone (FLONASE) 50 MCG/ACT nasal spray, Administer 2 sprays in each nostril daily.   Current Outpatient Medications (Hematological):    vitamin B-12 (CYANOCOBALAMIN) 500 MCG tablet, Take 500 mcg by mouth daily.  Current Outpatient Medications (Other):    gabapentin (NEURONTIN) 100 MG capsule, Take 2 capsules (200 mg total) by mouth at bedtime.   Calcium Carbonate-Vit D-Min (CALCIUM 1200 PO), Take 1,200 Units by mouth daily.   cholecalciferol (VITAMIN D) 1000 UNITS tablet, Take 2,000 Units by mouth daily.   clindamycin (CLINDAGEL) 1 % gel, Apply 1 Application topically as needed.   famotidine (PEPCID) 20 MG tablet, TAKE 1 TABLET BY MOUTH TWICE DAILY   meclizine (ANTIVERT) 25 MG tablet, Take 25 mg by mouth 2 (two) times daily as needed for dizziness.   melatonin 3 MG TABS tablet, Take 3 mg by mouth at bedtime as needed.   methocarbamol (ROBAXIN) 500 MG tablet, Take 500 mg by mouth every 6 (six) hours as needed.   metroNIDAZOLE (METROCREAM) 0.75 % cream,    nitrofurantoin, macrocrystal-monohydrate, (MACROBID) 100 MG capsule, Take 1 capsule (100 mg total) by mouth 2 (two) times daily.   OSCIMIN 0.125 MG SUBL, PLACE 1 TABLET (0.125 MG TOTAL) UNDER THE TONGUE EVERY 4 (FOUR) HOURS  AS NEEDED.   Potassium Chloride ER 20 MEQ TBCR, Take 1 tablet by mouth 2 (two) times daily with food   Reviewed prior external information including notes and imaging from  primary care provider As well as  notes that were available from care everywhere and other healthcare systems.  Past medical history, social, surgical and family history all reviewed in electronic medical record.  No pertanent information unless stated regarding to the chief complaint.   Review of Systems:  No headache, visual changes, nausea, vomiting, diarrhea, constipation, dizziness, abdominal pain, skin rash, fevers, chills, night sweats, weight loss, swollen lymph nodes, body aches, joint swelling, chest pain, shortness of breath, mood changes. POSITIVE muscle aches  Objective  Blood pressure 130/70, pulse 88, height 5\' 1"  (1.549 m), last menstrual period 11/15/2018, SpO2 98 %.   General: No apparent distress alert and oriented x3 mood and affect normal, dressed appropriately.  HEENT: Pupils equal, extraocular movements intact  Respiratory: Patient's speak in full sentences and does not appear short of breath  Cardiovascular: No lower extremity edema, non tender, no erythema  Left leg exam shows patient does have a positive straight leg test on the left side.  Nontender over the greater trochanteric area.  Mild tightness with FABER.  Patient is tender to palpation over the paraspinal musculature of the lumbar spine.  [id number]; 15 additional minutes spent for Therapeutic exercises as stated in above notes.  This included exercises focusing on stretching, strengthening, with significant focus on eccentric aspects.   Long term goals include an improvement in range of motion, strength, endurance as well as avoiding reinjury. Patient's frequency would include in 1-2 times a day, 3-5 times a week for a duration of 6-12 weeks.  Low back exercises that included:  Pelvic tilt/bracing instruction to focus on control of the pelvic  girdle and lower abdominal muscles  Glute strengthening exercises, focusing on proper firing of the glutes without engaging the low back muscles Proper stretching techniques for maximum relief for the hamstrings, hip flexors, low back and some rotation where tolerated Proper technique shown and discussed handout in great detail with ATC.  All questions were discussed and answered.      Impression and Recommendations:    The above documentation has been reviewed and is accurate and complete Judi Saa, DO

## 2023-03-16 ENCOUNTER — Ambulatory Visit (INDEPENDENT_AMBULATORY_CARE_PROVIDER_SITE_OTHER): Payer: 59

## 2023-03-16 ENCOUNTER — Ambulatory Visit (INDEPENDENT_AMBULATORY_CARE_PROVIDER_SITE_OTHER): Payer: 59 | Admitting: Family Medicine

## 2023-03-16 ENCOUNTER — Other Ambulatory Visit (HOSPITAL_BASED_OUTPATIENT_CLINIC_OR_DEPARTMENT_OTHER): Payer: Self-pay

## 2023-03-16 ENCOUNTER — Encounter: Payer: Self-pay | Admitting: Family Medicine

## 2023-03-16 VITALS — BP 130/70 | HR 88 | Ht 61.0 in

## 2023-03-16 DIAGNOSIS — M549 Dorsalgia, unspecified: Secondary | ICD-10-CM

## 2023-03-16 DIAGNOSIS — M542 Cervicalgia: Secondary | ICD-10-CM

## 2023-03-16 DIAGNOSIS — M545 Low back pain, unspecified: Secondary | ICD-10-CM | POA: Diagnosis not present

## 2023-03-16 DIAGNOSIS — R202 Paresthesia of skin: Secondary | ICD-10-CM | POA: Diagnosis not present

## 2023-03-16 DIAGNOSIS — M25552 Pain in left hip: Secondary | ICD-10-CM

## 2023-03-16 DIAGNOSIS — M2550 Pain in unspecified joint: Secondary | ICD-10-CM

## 2023-03-16 DIAGNOSIS — M5416 Radiculopathy, lumbar region: Secondary | ICD-10-CM

## 2023-03-16 DIAGNOSIS — M79605 Pain in left leg: Secondary | ICD-10-CM | POA: Diagnosis not present

## 2023-03-16 LAB — CBC WITH DIFFERENTIAL/PLATELET
Basophils Absolute: 0 10*3/uL (ref 0.0–0.1)
Basophils Relative: 0.8 % (ref 0.0–3.0)
Eosinophils Absolute: 0.1 10*3/uL (ref 0.0–0.7)
Eosinophils Relative: 1.7 % (ref 0.0–5.0)
HCT: 41.6 % (ref 36.0–46.0)
Hemoglobin: 13.6 g/dL (ref 12.0–15.0)
Lymphocytes Relative: 42.5 % (ref 12.0–46.0)
Lymphs Abs: 2.7 10*3/uL (ref 0.7–4.0)
MCHC: 32.7 g/dL (ref 30.0–36.0)
MCV: 91.9 fl (ref 78.0–100.0)
Monocytes Absolute: 0.4 10*3/uL (ref 0.1–1.0)
Monocytes Relative: 6.9 % (ref 3.0–12.0)
Neutro Abs: 3 10*3/uL (ref 1.4–7.7)
Neutrophils Relative %: 48.1 % (ref 43.0–77.0)
Platelets: 309 10*3/uL (ref 150.0–400.0)
RBC: 4.53 Mil/uL (ref 3.87–5.11)
RDW: 12.8 % (ref 11.5–15.5)
WBC: 6.3 10*3/uL (ref 4.0–10.5)

## 2023-03-16 LAB — IBC PANEL
Iron: 62 ug/dL (ref 42–145)
Saturation Ratios: 16.4 % — ABNORMAL LOW (ref 20.0–50.0)
TIBC: 378 ug/dL (ref 250.0–450.0)
Transferrin: 270 mg/dL (ref 212.0–360.0)

## 2023-03-16 LAB — COMPREHENSIVE METABOLIC PANEL
ALT: 15 U/L (ref 0–35)
AST: 19 U/L (ref 0–37)
Albumin: 4.4 g/dL (ref 3.5–5.2)
Alkaline Phosphatase: 65 U/L (ref 39–117)
BUN: 19 mg/dL (ref 6–23)
CO2: 31 mEq/L (ref 19–32)
Calcium: 9.6 mg/dL (ref 8.4–10.5)
Chloride: 99 mEq/L (ref 96–112)
Creatinine, Ser: 0.59 mg/dL (ref 0.40–1.20)
GFR: 98.78 mL/min (ref >60.00)
Glucose, Bld: 85 mg/dL (ref 70–99)
Potassium: 3.7 mEq/L (ref 3.5–5.1)
Sodium: 138 mEq/L (ref 135–145)
Total Bilirubin: 0.3 mg/dL (ref 0.2–1.2)
Total Protein: 7.8 g/dL (ref 6.0–8.3)

## 2023-03-16 LAB — VITAMIN D 25 HYDROXY (VIT D DEFICIENCY, FRACTURES): VITD: 38.04 ng/mL (ref 30.00–100.00)

## 2023-03-16 LAB — TSH: TSH: 1.11 u[IU]/mL (ref 0.35–5.50)

## 2023-03-16 LAB — URIC ACID: Uric Acid, Serum: 4.6 mg/dL (ref 2.4–7.0)

## 2023-03-16 LAB — VITAMIN B12: Vitamin B-12: 1294 pg/mL — ABNORMAL HIGH (ref 211–911)

## 2023-03-16 LAB — C-REACTIVE PROTEIN: CRP: <1 mg/dL (ref 0.5–20.0)

## 2023-03-16 LAB — FERRITIN: Ferritin: 47.9 ng/mL (ref 10.0–291.0)

## 2023-03-16 LAB — SEDIMENTATION RATE: Sed Rate: 21 mm/hr (ref 0–30)

## 2023-03-16 MED ORDER — PREDNISONE 20 MG PO TABS
20.0000 mg | ORAL_TABLET | Freq: Every day | ORAL | 0 refills | Status: DC
  Filled 2023-03-16: qty 5, 5d supply, fill #0

## 2023-03-16 MED ORDER — GABAPENTIN 100 MG PO CAPS
200.0000 mg | ORAL_CAPSULE | Freq: Every day | ORAL | 0 refills | Status: DC
  Filled 2023-03-16: qty 180, 90d supply, fill #0

## 2023-03-16 MED ORDER — METHYLPREDNISOLONE ACETATE 80 MG/ML IJ SUSP
80.0000 mg | Freq: Once | INTRAMUSCULAR | Status: AC
  Administered 2023-03-16: 80 mg via INTRAMUSCULAR

## 2023-03-16 MED ORDER — KETOROLAC TROMETHAMINE 60 MG/2ML IM SOLN
60.0000 mg | Freq: Once | INTRAMUSCULAR | Status: AC
  Administered 2023-03-16: 60 mg via INTRAMUSCULAR

## 2023-03-16 NOTE — Patient Instructions (Addendum)
Xrays today Prednisone 20mg  for 5 days starting tomorrow prescribed Gabapentin 200mg  prescribed Do prescribed exercises at least 3x a week  Labs today See you again in 4-6 weeks

## 2023-03-16 NOTE — Assessment & Plan Note (Signed)
Chronic issue.  Patient is having exacerbation.  Discussed with patient about icing regimen and home exercises.  Has had significant nerve injuries previously and given gabapentin.  I hope that this does help out significantly.  Discussed avoiding certain activities.  Home exercises given and will consider formal physical therapy.  Follow-up again in 6 to 8 weeks

## 2023-03-16 NOTE — Assessment & Plan Note (Signed)
Patient does have lumbar radiculopathy and seems to be more in the L5 distribution.  We discussed different treatment options and patient elected to have a Toradol and Depo-Medrol injection as well as a short course of prednisone.  Discussed icing regimen and home exercises, which activities to do and which ones to avoid.  Discussed with patient if any increasing weakness occurs advanced imaging would be warranted.

## 2023-03-16 NOTE — Assessment & Plan Note (Signed)
Will check labs.  Patient has had this intermittently previously and has had difficulty with some deficiencies previously.  Will get autoimmune labs as well.

## 2023-03-19 ENCOUNTER — Other Ambulatory Visit: Payer: Self-pay

## 2023-03-19 ENCOUNTER — Other Ambulatory Visit (HOSPITAL_BASED_OUTPATIENT_CLINIC_OR_DEPARTMENT_OTHER): Payer: Self-pay

## 2023-03-19 ENCOUNTER — Encounter: Payer: Self-pay | Admitting: Family Medicine

## 2023-03-19 LAB — ANA: Anti Nuclear Antibody (ANA): POSITIVE — AB

## 2023-03-19 LAB — ANTI-NUCLEAR AB-TITER (ANA TITER): ANA Titer 1: 1:40 {titer} — ABNORMAL HIGH

## 2023-03-19 LAB — CALCIUM, IONIZED: Calcium, Ion: 5.1 mg/dL (ref 4.7–5.5)

## 2023-03-19 LAB — ANGIOTENSIN CONVERTING ENZYME: Angiotensin-Converting Enzyme: 27 U/L (ref 9–67)

## 2023-03-19 LAB — CYCLIC CITRUL PEPTIDE ANTIBODY, IGG: Cyclic Citrullin Peptide Ab: <16 UNITS

## 2023-03-19 LAB — RHEUMATOID FACTOR: Rheumatoid fact SerPl-aCnc: 17 IU/mL — ABNORMAL HIGH (ref <14)

## 2023-03-19 LAB — PTH, INTACT AND CALCIUM
Calcium: 9.5 mg/dL (ref 8.6–10.4)
PTH: 46 pg/mL (ref 16–77)

## 2023-03-19 MED ORDER — POTASSIUM CHLORIDE ER 20 MEQ PO TBCR
20.0000 meq | EXTENDED_RELEASE_TABLET | Freq: Two times a day (BID) | ORAL | 1 refills | Status: DC
  Filled 2023-03-19: qty 40, 20d supply, fill #0
  Filled 2023-03-22: qty 140, 70d supply, fill #0
  Filled 2023-03-22: qty 180, 90d supply, fill #0

## 2023-03-22 ENCOUNTER — Other Ambulatory Visit: Payer: Self-pay

## 2023-03-22 ENCOUNTER — Other Ambulatory Visit: Payer: Self-pay | Admitting: Family Medicine

## 2023-03-22 ENCOUNTER — Other Ambulatory Visit (HOSPITAL_BASED_OUTPATIENT_CLINIC_OR_DEPARTMENT_OTHER): Payer: Self-pay

## 2023-03-22 DIAGNOSIS — R768 Other specified abnormal immunological findings in serum: Secondary | ICD-10-CM

## 2023-03-22 DIAGNOSIS — M2550 Pain in unspecified joint: Secondary | ICD-10-CM

## 2023-03-31 ENCOUNTER — Other Ambulatory Visit (HOSPITAL_BASED_OUTPATIENT_CLINIC_OR_DEPARTMENT_OTHER): Payer: Self-pay

## 2023-04-01 ENCOUNTER — Ambulatory Visit: Payer: 59 | Admitting: Family Medicine

## 2023-04-01 ENCOUNTER — Other Ambulatory Visit (HOSPITAL_BASED_OUTPATIENT_CLINIC_OR_DEPARTMENT_OTHER): Payer: Self-pay

## 2023-04-01 ENCOUNTER — Encounter: Payer: Self-pay | Admitting: Family Medicine

## 2023-04-01 VITALS — BP 118/74 | HR 100 | Temp 97.8°F | Ht 61.0 in | Wt 131.0 lb

## 2023-04-01 DIAGNOSIS — R339 Retention of urine, unspecified: Secondary | ICD-10-CM

## 2023-04-01 DIAGNOSIS — R400 Somnolence: Secondary | ICD-10-CM | POA: Diagnosis not present

## 2023-04-01 DIAGNOSIS — K219 Gastro-esophageal reflux disease without esophagitis: Secondary | ICD-10-CM

## 2023-04-01 DIAGNOSIS — R319 Hematuria, unspecified: Secondary | ICD-10-CM

## 2023-04-01 DIAGNOSIS — R0683 Snoring: Secondary | ICD-10-CM | POA: Diagnosis not present

## 2023-04-01 DIAGNOSIS — R7303 Prediabetes: Secondary | ICD-10-CM | POA: Insufficient documentation

## 2023-04-01 LAB — POC URINALSYSI DIPSTICK (AUTOMATED)
Bilirubin, UA: NEGATIVE
Blood, UA: POSITIVE
Glucose, UA: NEGATIVE
Ketones, UA: NEGATIVE
Leukocytes, UA: NEGATIVE
Nitrite, UA: NEGATIVE
Protein, UA: NEGATIVE
Spec Grav, UA: >=1.03 — AB (ref 1.010–1.025)
Urobilinogen, UA: 0.2 E.U./dL
pH, UA: 6 (ref 5.0–8.0)

## 2023-04-01 LAB — POCT GLYCOSYLATED HEMOGLOBIN (HGB A1C): Hemoglobin A1C: 5.7 % — AB (ref 4.0–5.6)

## 2023-04-01 MED ORDER — FAMOTIDINE 20 MG PO TABS
ORAL_TABLET | Freq: Two times a day (BID) | ORAL | 5 refills | Status: DC
  Filled 2023-04-01: qty 60, 30d supply, fill #0

## 2023-04-01 NOTE — Assessment & Plan Note (Signed)
Epworth sleepiness scale 7. Hold off on sleep study for now. She will monitor symptoms. Possibly started around the same time as she cut back on sugar and carbohydrates.

## 2023-04-01 NOTE — Assessment & Plan Note (Signed)
Follow up with Dr. Berneice Heinrich, urology.

## 2023-04-01 NOTE — Patient Instructions (Signed)
Your blood sugars have improved significantly.  Your hemoglobin A1c is 5.7%.  Keep up the good work.  Keep an eye on your symptoms, daytime sleepiness, and keep me posted.  You should call and schedule with [facility] as discussed.

## 2023-04-01 NOTE — Progress Notes (Signed)
New Patient Office Visit  Subjective    Patient ID: Nicole Crosby, female    DOB: [birth year]  Age: 57 y.o. MRN: [id number]  CC:  Chief Complaint  Patient presents with   Establish Care    Urinary frequency has gone down since September but drinking a lot. 2 UTIs in Feb, not emptying bladder and leaking.   Also would like to discuss sudden sleepiness.  A1c borderline diabetic in October when she had physical (6.1)    HPI Nicole Crosby presents to establish care  Other providers: Cardiologist- Dr. Jens Som  Neurologist- Shawnie Dapper, NP and Dr. Lucia Gaskins Sports medicine- Dr. Ayesha Mohair  GI- Dr. Leone Payor   Incomplete bladder emptying and recent UTIs. Hx of hematuria.  She has seen Dr. Berneice Heinrich in the past. Will follow up there.   Daytime sleepiness, severe for the past few months. Intermittent. No hx of sleep apnea. Never been tested.  Snores.  No apnea spells.  Started around the same time she cut sugar and carbohydrates.    Hx of prediabetes.   GERD- needs refills of famotidine.    Outpatient Encounter Medications as of 04/01/2023  Medication Sig   Calcium Carbonate-Vit D-Min (CALCIUM 1200 PO) Take 1,200 Units by mouth daily.   cholecalciferol (VITAMIN D) 1000 UNITS tablet Take 2,000 Units by mouth daily.   clindamycin (CLINDAGEL) 1 % gel Apply 1 Application topically as needed.   fluticasone (FLONASE) 50 MCG/ACT nasal spray Administer 2 sprays in each nostril daily.   losartan-hydrochlorothiazide (HYZAAR) 50-12.5 MG tablet TAKE 1 TABLET BY MOUTH ONCE DAILY   meclizine (ANTIVERT) 25 MG tablet Take 25 mg by mouth 2 (two) times daily as needed for dizziness.   methocarbamol (ROBAXIN) 500 MG tablet Take 500 mg by mouth every 6 (six) hours as needed.   metoprolol succinate (TOPROL XL) 25 MG 24 hr tablet Take 1/2 tablet (12.5 mg total) by mouth at bedtime.   metroNIDAZOLE (METROCREAM) 0.75 % cream    Multiple Vitamin (MULTIVITAMIN ADULT PO) Take by mouth.   OSCIMIN 0.125 MG  SUBL PLACE 1 TABLET (0.125 MG TOTAL) UNDER THE TONGUE EVERY 4 (FOUR) HOURS AS NEEDED.   Potassium Chloride ER 20 MEQ TBCR Take 1 tablet (20 mEq total) by mouth 2 (two) times daily.   famotidine (PEPCID) 20 MG tablet TAKE 1 TABLET BY MOUTH TWICE DAILY   [DISCONTINUED] famotidine (PEPCID) 20 MG tablet TAKE 1 TABLET BY MOUTH TWICE DAILY   [DISCONTINUED] gabapentin (NEURONTIN) 100 MG capsule Take 2 capsules (200 mg total) by mouth at bedtime. (Patient not taking: Reported on 04/01/2023)   [DISCONTINUED] melatonin 3 MG TABS tablet Take 3 mg by mouth at bedtime as needed.   [DISCONTINUED] nitrofurantoin, macrocrystal-monohydrate, (MACROBID) 100 MG capsule Take 1 capsule (100 mg total) by mouth 2 (two) times daily.   [DISCONTINUED] predniSONE (DELTASONE) 20 MG tablet Take 1 tablet (20 mg total) by mouth daily with breakfast.   [DISCONTINUED] rosuvastatin (CRESTOR) 10 MG tablet Take 1 tablet (10 mg total) by mouth daily.   [DISCONTINUED] vitamin B-12 (CYANOCOBALAMIN) 500 MCG tablet Take 500 mcg by mouth daily.   No facility-administered encounter medications on file as of 04/01/2023.    Past Medical History:  Diagnosis Date   Allergy    Buspirone, Sulfa, Pantoprazole   Benign fundic gland polyps of stomach 02/12/2016   Chicken pox    Dizziness    Eczema    Functional dyspepsia    GERD (gastroesophageal reflux disease)    H/O adenomatous  polyp of colon 10/04/2014   09/2014 - 3 mm adenoma - repeat colonoscopy 2022   Hyperlipidemia    no meds   Hypertension    pt denies   IBS (irritable bowel syndrome)    Insomnia    MEDIAL EPICONDYLITIS, RIGHT 07/17/2010   Nasal turbinate hypertrophy 09/08/2022   Nonulcer dyspepsia 02/12/2016   Other abnormal glucose    Tiny hypodensity left hepatic lobe-CT of abdomen and pelvis 07/26/2008-small 8mm periumblical hernia   PAC (premature atrial contraction)    Palpitations    Tinnitus, bilateral 09/21/2018    Past Surgical History:  Procedure Laterality  Date   CESAREAN SECTION  01/2001   COLONOSCOPY     DE QUERVAIN'S RELEASE  2000   Right wrist area   ESOPHAGOGASTRODUODENOSCOPY     EYE SURGERY  ?2018   Right eye to relieve pressure    Family History  Problem Relation Age of Onset   Hypertension Mother    Hyperlipidemia Mother    Fibrocystic breast disease Mother    Hearing loss Mother    Osteopenia Mother    Dementia Mother    Stroke Mother    Prostate cancer Father 23       deceased   Hypertension Father    Diabetes Father    Kidney disease Father    Heart disease Father        MI   Hyperlipidemia Father    Cancer Father    Thyroid disease Sister    Diabetes Sister        pre-diabetes   Hypertension Sister    Arthritis Sister    Hyperlipidemia Sister    Diabetes Sister    Hypertension Sister    Hyperlipidemia Sister    Fibrocystic breast disease Sister    Neuropathy Sister    Hyperlipidemia Sister    Hypertension Sister    Other Brother        unknown   Colon cancer Maternal Uncle        died in late 67's   Diabetes Maternal Grandmother    Prostate cancer Maternal Grandfather    COPD Maternal Grandfather    Rectal cancer Neg Hx    Stomach cancer Neg Hx    Esophageal cancer Neg Hx    Pancreatic cancer Neg Hx     Social History   Socioeconomic History   Marital status: Married    Spouse name: Not on file   Number of children: 2   Years of education: Not on file   Highest education level: Not on file  Occupational History   Occupation: Xray/CT Theme park manager: [employer]  Tobacco Use   Smoking status: Never   Smokeless tobacco: Never  Vaping Use   Vaping Use: Never used  Substance and Sexual Activity   Alcohol use: Yes    Comment: Occasional   Drug use: No   Sexual activity: Yes    Birth control/protection: Post-menopausal  Other Topics Concern   Not on file  Social History Narrative   Occupation: Engineer, 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    Alcohol Use - yes-wine          Full Time    Married           Social Determinants of Health   Financial Resource Strain: Not on file  Food Insecurity: Not on file  Transportation Needs: Not on file  Physical Activity: Not on file  Stress: Not on file  Social Connections: Not on file  Intimate Partner Violence: Not on file    Review of Systems  Constitutional:  Negative for chills, fever and malaise/fatigue.  Respiratory:  Negative for shortness of breath.   Cardiovascular:  Negative for chest pain, palpitations and leg swelling.  Gastrointestinal:  Negative for abdominal pain, constipation, diarrhea, nausea and vomiting.  Genitourinary:  Positive for hematuria. Negative for dysuria, frequency and urgency.  Neurological:  Negative for dizziness and focal weakness.  Psychiatric/Behavioral:  Negative for depression. The patient is not nervous/anxious and does not have insomnia.         Objective    BP 118/74 (BP Location: Left Arm, Patient Position: Sitting, Cuff Size: Large)   Pulse 100   Temp 97.8 F (36.6 C) (Temporal)   Ht 5\' 1"  (1.549 m)   Wt 131 lb (59.4 kg)   LMP 11/15/2018   SpO2 98%   BMI 24.75 kg/m   Physical Exam Constitutional:      General: She is not in acute distress.    Appearance: She is not ill-appearing.  Eyes:     Extraocular Movements: Extraocular movements intact.     Conjunctiva/sclera: Conjunctivae normal.  Cardiovascular:     Rate and Rhythm: Normal rate and regular rhythm.  Pulmonary:     Effort: Pulmonary effort is normal.     Breath sounds: Normal breath sounds.  Musculoskeletal:     Cervical back: Normal range of motion and neck supple.  Skin:    General: Skin is warm and dry.  Neurological:     General: No focal deficit present.     Mental Status: She is alert and oriented to person, place, and time.     Cranial Nerves: No cranial nerve deficit.     Motor: No weakness.     Gait: Gait normal.  Psychiatric:        Mood and Affect: Mood normal.         Behavior: Behavior normal.        Thought Content: Thought content normal.         Assessment & Plan:   Problem List Items Addressed This Visit       Digestive   GERD (gastroesophageal reflux disease)    Controlled on famotidine. Refilled. GI is Dr. Leone Payor.       Relevant Medications   famotidine (PEPCID) 20 MG tablet     Other   Daytime somnolence    Epworth sleepiness scale 7. Hold off on sleep study for now. She will monitor symptoms. Possibly started around the same time as she cut back on sugar and carbohydrates.       Hematuria    Follow up with Dr. Berneice Heinrich, urology.       Relevant Orders   Ambulatory referral to Urology   POCT Urinalysis Dipstick (Automated) (Completed)   Incomplete bladder emptying - Primary    UA +blood, negative otherwise. Follow up with urologist.       Relevant Orders   Ambulatory referral to Urology   POCT Urinalysis Dipstick (Automated) (Completed)   Prediabetes    Hgb A1c 5.7% (down from 6.1%). continue low sugar, low carb diet      Relevant Orders   POCT glycosylated hemoglobin (Hb A1C) (Completed)   Other Visit Diagnoses     Snores           No follow-ups on file.   Hetty Blend, NP-C

## 2023-04-01 NOTE — Assessment & Plan Note (Signed)
UA +blood, negative otherwise. Follow up with urologist.

## 2023-04-01 NOTE — Assessment & Plan Note (Signed)
Hgb A1c 5.7% (down from 6.1%). continue low sugar, low carb diet

## 2023-04-01 NOTE — Assessment & Plan Note (Signed)
Controlled on famotidine. Refilled. GI is Dr. Leone Payor.

## 2023-04-06 DIAGNOSIS — H52223 Regular astigmatism, bilateral: Secondary | ICD-10-CM | POA: Diagnosis not present

## 2023-04-09 ENCOUNTER — Encounter: Payer: Self-pay | Admitting: Family Medicine

## 2023-04-13 NOTE — Progress Notes (Unsigned)
[facility] [address] [place] [address] Phone: [phone] Subjective:   INadine Counts, am serving as a scribe for Dr. Antoine Primas.  I'm seeing this patient by the request  of:  Henson, Vickie L, NP-C  CC: Leg pain, back pain, multiple different things.  [id number]  03/16/2023 Will check labs. Patient has had this intermittently previously and has had difficulty with some deficiencies previously. Will get autoimmune labs as well.   Chronic issue. Patient is having exacerbation. Discussed with patient about icing regimen and home exercises. Has had significant nerve injuries previously and given gabapentin. I hope that this does help out significantly. Discussed avoiding certain activities. Home exercises given and will consider formal physical therapy. Follow-up again in 6 to 8 weeks   Patient does have lumbar radiculopathy and seems to be more in the L5 distribution.  We discussed different treatment options and patient elected to have a Toradol and Depo-Medrol injection as well as a short course of prednisone.  Discussed icing regimen and home exercises, which activities to do and which ones to avoid.  Discussed with patient if any increasing weakness occurs advanced imaging would be warranted.     Updated 04/14/2023 SHAKEYLA GIEBLER is a 57 y.o. female coming in with complaint of L leg numbness. Started 2019 and is getting progressively worse. Whole left side constant, no treatment is helping thus far. Thinks she might have MS.  Reviewing patient's chart has had significant difficulty.  Did have an MRI previously looking at the trigeminal nerve but at that point with the review did have a nonspecific demyelinating aspect in the brain that was not further evaluated.  Patient states that these pains do make her lose work and affect her daily activities.  Sometimes has such significant fatigue that all she can do is lay in bed and has multiple  days where she is not able to be active.  Feels like something else is going on.  We have gotten a laboratory workup that did show a regular sedimentation rate but unfortunately did have a positive ANA.  Low titer.    Past Medical History:  Diagnosis Date   Allergy    Buspirone, Sulfa, Pantoprazole   Benign fundic gland polyps of stomach 02/12/2016   Chicken pox    Dizziness    Eczema    Functional dyspepsia    GERD (gastroesophageal reflux disease)    H/O adenomatous polyp of colon 10/04/2014   09/2014 - 3 mm adenoma - repeat colonoscopy 2022   Hyperlipidemia    no meds   Hypertension    pt denies   IBS (irritable bowel syndrome)    Insomnia    MEDIAL EPICONDYLITIS, RIGHT 07/17/2010   Nasal turbinate hypertrophy 09/08/2022   Nonulcer dyspepsia 02/12/2016   Other abnormal glucose    Tiny hypodensity left hepatic lobe-CT of abdomen and pelvis 07/26/2008-small 8mm periumblical hernia   PAC (premature atrial contraction)    Palpitations    Tinnitus, bilateral 09/21/2018   Past Surgical History:  Procedure Laterality Date   CESAREAN SECTION  01/2001   COLONOSCOPY     DE QUERVAIN'S RELEASE  2000   Right wrist area   ESOPHAGOGASTRODUODENOSCOPY     EYE SURGERY  ?2018   Right eye to relieve pressure   Social History   Socioeconomic History   Marital status: Married    Spouse name: Not on file   Number of children: 2   Years of education: Not  on file   Highest education level: Not on file  Occupational History   Occupation: Xray/CT Theme park manager: [employer]  Tobacco Use   Smoking status: Never   Smokeless tobacco: Never  Vaping Use   Vaping Use: Never used  Substance and Sexual Activity   Alcohol use: Yes    Comment: Occasional   Drug use: No   Sexual activity: Yes    Birth control/protection: Post-menopausal  Other Topics Concern   Not on file  Social History Narrative   Occupation: Engineer, 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    Alcohol Use -  yes-wine         Full Time    Married           Social Determinants of Corporate investment banker Strain: Not on file  Food Insecurity: Not on file  Transportation Needs: Not on file  Physical Activity: Not on file  Stress: Not on file  Social Connections: Not on file   Allergies  Allergen Reactions   Buspirone Other (See Comments)     nightmare   Esomeprazole Other (See Comments)    headache   Ezetimibe     Abdominal pain, diarrhea   Lansoprazole     Abdominal pain   Omeprazole     Headache   Proton Pump Inhibitors    Sulfonamide Derivatives Rash     Rash   Family History  Problem Relation Age of Onset   Hypertension Mother    Hyperlipidemia Mother    Fibrocystic breast disease Mother    Hearing loss Mother    Osteopenia Mother    Dementia Mother    Stroke Mother    Prostate cancer Father 44       deceased   Hypertension Father    Diabetes Father    Kidney disease Father    Heart disease Father        MI   Hyperlipidemia Father    Cancer Father    Thyroid disease Sister    Diabetes Sister        pre-diabetes   Hypertension Sister    Arthritis Sister    Hyperlipidemia Sister    Diabetes Sister    Hypertension Sister    Hyperlipidemia Sister    Fibrocystic breast disease Sister    Neuropathy Sister    Hyperlipidemia Sister    Hypertension Sister    Other Brother        unknown   Colon cancer Maternal Uncle        died in late 93's   Diabetes Maternal Grandmother    Prostate cancer Maternal Grandfather    COPD Maternal Grandfather    Rectal cancer Neg Hx    Stomach cancer Neg Hx    Esophageal cancer Neg Hx    Pancreatic cancer Neg Hx      Current Outpatient Medications (Cardiovascular):    losartan-hydrochlorothiazide (HYZAAR) 50-12.5 MG tablet, TAKE 1 TABLET BY MOUTH ONCE DAILY   metoprolol succinate (TOPROL XL) 25 MG 24 hr tablet, Take 1/2 tablet (12.5 mg total) by mouth at bedtime.  Current Outpatient Medications (Respiratory):     fluticasone (FLONASE) 50 MCG/ACT nasal spray, Administer 2 sprays in each nostril daily.    Current Outpatient Medications (Other):    Calcium Carbonate-Vit D-Min (CALCIUM 1200 PO), Take 1,200 Units by mouth daily.   cholecalciferol (VITAMIN D) 1000 UNITS tablet, Take 2,000 Units by mouth daily.   clindamycin (CLINDAGEL) 1 %  gel, Apply 1 Application topically as needed.   famotidine (PEPCID) 20 MG tablet, TAKE 1 TABLET BY MOUTH TWICE DAILY   meclizine (ANTIVERT) 25 MG tablet, Take 25 mg by mouth 2 (two) times daily as needed for dizziness.   methocarbamol (ROBAXIN) 500 MG tablet, Take 500 mg by mouth every 6 (six) hours as needed.   metroNIDAZOLE (METROCREAM) 0.75 % cream,    Multiple Vitamin (MULTIVITAMIN ADULT PO), Take by mouth.   OSCIMIN 0.125 MG SUBL, PLACE 1 TABLET (0.125 MG TOTAL) UNDER THE TONGUE EVERY 4 (FOUR) HOURS AS NEEDED.   Potassium Chloride ER 20 MEQ TBCR, Take 1 tablet (20 mEq total) by mouth 2 (two) times daily.   Reviewed prior external information including notes and imaging from  primary care provider As well as notes that were available from care everywhere and other healthcare systems.  Patient has been seen for radicular symptoms in multiple different areas.  This includes the face, lumbar, neck, ulnar area as well as the elbow.  Past medical history, social, surgical and family history all reviewed in electronic medical record.  No pertanent information unless stated regarding to the chief complaint.   Review of Systems:  No headache, visual changes, nausea, vomiting, diarrhea, constipation, dizziness, abdominal pain, skin rash, fevers, chills, night sweats, weight loss, swollen lymph nodes, body aches, joint swelling, chest pain, shortness of breath, mood changes. POSITIVE muscle aches  Objective  Blood pressure 124/76, pulse 98, height 5\' 1"  (1.549 m), last menstrual period 11/15/2018, SpO2 98 %.   General: No apparent distress alert and oriented x3 mood and  affect normal, dressed appropriately.  HEENT: Pupils equal, extraocular movements intact  Respiratory: Patient's speak in full sentences and does not appear short of breath  Cardiovascular: No lower extremity edema, non tender, no erythema  Patient does have some weakness especially in the C8 distribution of the neck bilaterally. Tenderness to palpation in multiple other areas.  Patient though does sit comfortably.  Patient's pain does seem to be out of proportion.  Does have some weakness right during getting out of a chair.   Impression and Recommendations:    The above documentation has been reviewed and is accurate and complete Judi Saa, DO

## 2023-04-14 ENCOUNTER — Ambulatory Visit (INDEPENDENT_AMBULATORY_CARE_PROVIDER_SITE_OTHER): Payer: 59 | Admitting: Family Medicine

## 2023-04-14 VITALS — BP 124/76 | HR 98 | Ht 61.0 in

## 2023-04-14 DIAGNOSIS — M542 Cervicalgia: Secondary | ICD-10-CM | POA: Diagnosis not present

## 2023-04-14 DIAGNOSIS — G4489 Other headache syndrome: Secondary | ICD-10-CM

## 2023-04-14 DIAGNOSIS — R202 Paresthesia of skin: Secondary | ICD-10-CM

## 2023-04-14 NOTE — Patient Instructions (Addendum)
MRI Brain and cervical ordered Depend on finding we will see if nerve conduction study is necessary We'll be mychart friends for a while

## 2023-04-14 NOTE — Assessment & Plan Note (Signed)
Patient has had significant difficulties with reviewing patient's chart.  Patient has had the trigeminal nerve disorder, paresthesias, neck pain, left L3 lumbar radiculitis, chronic headaches as well.  Patient has intermittent weakness and has had difficulty with the ulnar nerve entrapment.  Workup so far has shown with an MRI of the brain and a very nonspecific demyelinating in nature greater than a year and a half ago.  At this point I do think I would like to consider the possibility of a repeat MRI of the brain to further evaluate if any demyelinating process could be potentially contributing.  Will also get an MRI of the neck with patient having more radicular symptoms at the moment.  Depending on the findings we can discuss further treatment options.  Follow-up patient is also responded well to steroid previously but will hold at this point with patient willing to get the imaging can consider this if necessary.  If for some reason we do not see any demyelinating pathology I would consider a nerve conduction test to further evaluate.

## 2023-05-04 ENCOUNTER — Ambulatory Visit (INDEPENDENT_AMBULATORY_CARE_PROVIDER_SITE_OTHER): Payer: 59

## 2023-05-04 DIAGNOSIS — G4489 Other headache syndrome: Secondary | ICD-10-CM

## 2023-05-04 DIAGNOSIS — M542 Cervicalgia: Secondary | ICD-10-CM | POA: Diagnosis not present

## 2023-05-04 DIAGNOSIS — M4802 Spinal stenosis, cervical region: Secondary | ICD-10-CM | POA: Diagnosis not present

## 2023-05-04 DIAGNOSIS — R519 Headache, unspecified: Secondary | ICD-10-CM | POA: Diagnosis not present

## 2023-05-04 MED ORDER — GADOBUTROL 1 MMOL/ML IV SOLN
6.0000 mL | Freq: Once | INTRAVENOUS | Status: AC | PRN
Start: 2023-05-04 — End: 2023-05-04
  Administered 2023-05-04: 6 mL via INTRAVENOUS

## 2023-05-05 ENCOUNTER — Encounter: Payer: Self-pay | Admitting: Family Medicine

## 2023-05-05 ENCOUNTER — Ambulatory Visit: Payer: 59 | Admitting: Family Medicine

## 2023-05-05 ENCOUNTER — Other Ambulatory Visit (HOSPITAL_COMMUNITY): Payer: Self-pay

## 2023-05-05 VITALS — BP 106/70 | HR 88 | Temp 97.6°F | Ht 61.0 in | Wt 131.0 lb

## 2023-05-05 DIAGNOSIS — M79662 Pain in left lower leg: Secondary | ICD-10-CM

## 2023-05-05 DIAGNOSIS — M549 Dorsalgia, unspecified: Secondary | ICD-10-CM | POA: Diagnosis not present

## 2023-05-05 LAB — D-DIMER, QUANTITATIVE: D-Dimer, Quant: 0.2 mcg/mL FEU (ref <0.50)

## 2023-05-05 NOTE — Patient Instructions (Signed)
Continue to monitor your symptoms. Use heat, ice or topical pain medications.   Let us know if you develop any new or worsening symptoms.   Follow up with Dr. Katrinka Blazing

## 2023-05-05 NOTE — Progress Notes (Signed)
Subjective:     Patient ID: Nicole Crosby, female    DOB: 1966/07/25, 57 y.o.   MRN: [id number]  Chief Complaint  Patient presents with   Back Pain    Left upper back pain for months   calf pain    Left calf pain for months, but 3rd week where it has worsened. Intermittent    HPI  Discussed the use of AI scribe software for clinical note transcription with the patient, who gave verbal consent to proceed.  History of Present Illness         Left upper back pain, intermittent x several months. No injury.  Sharp stabbing pain. Lasts a few minutes to 30 minutes. Increasing in frequency.  Pain sometimes triggered with movement.   No fever, chills, cough, shortness of breath, abdominal pain, N/V.   Left calf pain, intermittent x 1-2 months. No significant swelling or tenderness.   No hx of DVT or PE.   Denies any recent surgery or immobilization. No airplane rides. Recent car ride 4 hours.    Health Maintenance Due  Topic Date Due   HIV Screening  Never done   Hepatitis C Screening  Never done   PAP SMEAR-Modifier  11/17/2021    Past Medical History:  Diagnosis Date   Allergy    Buspirone, Sulfa, Pantoprazole   Benign fundic gland polyps of stomach 02/12/2016   Chicken pox    Dizziness    Eczema    Functional dyspepsia    GERD (gastroesophageal reflux disease)    H/O adenomatous polyp of colon 10/04/2014   09/2014 - 3 mm adenoma - repeat colonoscopy 2022   Hyperlipidemia    no meds   Hypertension    pt denies   IBS (irritable bowel syndrome)    Insomnia    MEDIAL EPICONDYLITIS, RIGHT 07/17/2010   Nasal turbinate hypertrophy 09/08/2022   Nonulcer dyspepsia 02/12/2016   Other abnormal glucose    Tiny hypodensity left hepatic lobe-CT of abdomen and pelvis 07/26/2008-small 8mm periumblical hernia   PAC (premature atrial contraction)    Palpitations    Tinnitus, bilateral 09/21/2018    Past Surgical History:  Procedure Laterality Date   CESAREAN SECTION   01/2001   COLONOSCOPY     DE QUERVAIN'S RELEASE  2000   Right wrist area   ESOPHAGOGASTRODUODENOSCOPY     EYE SURGERY  ?2018   Right eye to relieve pressure    Family History  Problem Relation Age of Onset   Hypertension Mother    Hyperlipidemia Mother    Fibrocystic breast disease Mother    Hearing loss Mother    Osteopenia Mother    Dementia Mother    Stroke Mother    Prostate cancer Father 13       deceased   Hypertension Father    Diabetes Father    Kidney disease Father    Heart disease Father        MI   Hyperlipidemia Father    Cancer Father    Thyroid disease Sister    Diabetes Sister        pre-diabetes   Hypertension Sister    Arthritis Sister    Hyperlipidemia Sister    Diabetes Sister    Hypertension Sister    Hyperlipidemia Sister    Fibrocystic breast disease Sister    Neuropathy Sister    Hyperlipidemia Sister    Hypertension Sister    Other Brother        unknown  Colon cancer Maternal Uncle        died in late 50's   Diabetes Maternal Grandmother    Prostate cancer Maternal Grandfather    COPD Maternal Grandfather    Rectal cancer Neg Hx    Stomach cancer Neg Hx    Esophageal cancer Neg Hx    Pancreatic cancer Neg Hx     Social History   Socioeconomic History   Marital status: Married    Spouse name: Not on file   Number of children: 2   Years of education: Not on file   Highest education level: Associate degree: occupational, Scientist, product/process development, or vocational program  Occupational History   Occupation: Xray/CT Theme park manager: [employer]  Tobacco Use   Smoking status: Never   Smokeless tobacco: Never  Vaping Use   Vaping Use: Never used  Substance and Sexual Activity   Alcohol use: Yes    Comment: Occasional   Drug use: No   Sexual activity: Yes    Birth control/protection: Post-menopausal  Other Topics Concern   Not on file  Social History Narrative   Occupation: Engineer, 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    Alcohol  Use - yes-wine         Full Time    Married           Social Determinants of Health   Financial Resource Strain: Low Risk  (05/04/2023)   Overall Financial Resource Strain (CARDIA)    Difficulty of Paying Living Expenses: Not hard at all  Food Insecurity: No Food Insecurity (05/04/2023)   Hunger Vital Sign    Worried About Running Out of Food in the Last Year: Never true    Ran Out of Food in the Last Year: Never true  Transportation Needs: No Transportation Needs (05/04/2023)   PRAPARE - Administrator, Civil Service (Medical): No    Lack of Transportation (Non-Medical): No  Physical Activity: Unknown (05/04/2023)   Exercise Vital Sign    Days of Exercise per Week: Patient declined    Minutes of Exercise per Session: Not on file  Stress: No Stress Concern Present (05/04/2023)   [employer] of Occupational Health - Occupational Stress Questionnaire    Feeling of Stress : Only a little  Social Connections: Unknown (05/04/2023)   Social Connection and Isolation Panel [NHANES]    Frequency of Communication with Friends and Family: Never    Frequency of Social Gatherings with Friends and Family: Never    Attends Religious Services: Patient declined    Database administrator or Organizations: No    Attends Engineer, structural: Not on file    Marital Status: Married  Catering manager Violence: Not on file    Outpatient Medications Prior to Visit  Medication Sig Dispense Refill   Calcium Carbonate-Vit D-Min (CALCIUM 1200 PO) Take 1,200 Units by mouth daily.     cholecalciferol (VITAMIN D) 1000 UNITS tablet Take 2,000 Units by mouth daily.     clindamycin (CLINDAGEL) 1 % gel Apply 1 Application topically as needed.     famotidine (PEPCID) 20 MG tablet TAKE 1 TABLET BY MOUTH TWICE DAILY 60 tablet 5   fluticasone (FLONASE) 50 MCG/ACT nasal spray Administer 2 sprays in each nostril daily. 16 g 5   losartan-hydrochlorothiazide (HYZAAR) 50-12.5 MG tablet TAKE 1 TABLET BY  MOUTH ONCE DAILY 90 tablet 2   meclizine (ANTIVERT) 25 MG tablet Take 25 mg by mouth 2 (two)  times daily as needed for dizziness.     methocarbamol (ROBAXIN) 500 MG tablet Take 500 mg by mouth every 6 (six) hours as needed.     metoprolol succinate (TOPROL XL) 25 MG 24 hr tablet Take 1/2 tablet (12.5 mg total) by mouth at bedtime. 45 tablet 3   metroNIDAZOLE (METROCREAM) 0.75 % cream      Multiple Vitamin (MULTIVITAMIN ADULT PO) Take by mouth.     OSCIMIN 0.125 MG SUBL PLACE 1 TABLET (0.125 MG TOTAL) UNDER THE TONGUE EVERY 4 (FOUR) HOURS AS NEEDED. 60 tablet 0   Potassium Chloride ER 20 MEQ TBCR Take 1 tablet (20 mEq total) by mouth 2 (two) times daily. 180 tablet 1   No facility-administered medications prior to visit.    Allergies  Allergen Reactions   Buspirone Other (See Comments)     nightmare   Esomeprazole Other (See Comments)    headache   Ezetimibe     Abdominal pain, diarrhea   Lansoprazole     Abdominal pain   Omeprazole     Headache   Proton Pump Inhibitors    Sulfonamide Derivatives Rash     Rash    Review of Systems  Constitutional:  Negative for chills and fever.  Respiratory:  Negative for shortness of breath.   Cardiovascular:  Negative for chest pain, palpitations and leg swelling.  Gastrointestinal:  Negative for abdominal pain, nausea and vomiting.  Musculoskeletal:  Positive for back pain and myalgias.  Neurological:  Negative for dizziness, sensory change and focal weakness.       Objective:    Physical Exam Constitutional:      General: She is not in acute distress.    Appearance: She is not ill-appearing.  HENT:     Mouth/Throat:     Mouth: Mucous membranes are moist.     Pharynx: Oropharynx is clear.  Eyes:     Extraocular Movements: Extraocular movements intact.     Conjunctiva/sclera: Conjunctivae normal.  Cardiovascular:     Rate and Rhythm: Normal rate and regular rhythm.  Pulmonary:     Effort: Pulmonary effort is normal.      Breath sounds: Normal breath sounds.  Musculoskeletal:     Cervical back: Normal, normal range of motion and neck supple.     Thoracic back: Tenderness present. Normal range of motion.     Right lower leg: No edema.     Left lower leg: No tenderness. No edema.     Left foot: Normal.     Comments: Left upper back with focal TTP.  Left calf without significant edema. No erythema or TTP. Negative Homan's  Skin:    General: Skin is warm and dry.     Findings: No erythema.  Neurological:     General: No focal deficit present.     Mental Status: She is alert and oriented to person, place, and time.     Cranial Nerves: No cranial nerve deficit.     Sensory: No sensory deficit.     Motor: No weakness.     Coordination: Coordination normal.     Gait: Gait normal.  Psychiatric:        Mood and Affect: Mood normal.        Behavior: Behavior normal.        Thought Content: Thought content normal.      BP 106/70 (BP Location: Left Arm, Patient Position: Sitting, Cuff Size: Large)   Pulse 88   Temp 97.6 F (36.4 C) (  Temporal)   Ht 5\' 1"  (1.549 m)   Wt 131 lb (59.4 kg)   LMP 11/15/2018   SpO2 99%   BMI 24.75 kg/m  Wt Readings from Last 3 Encounters:  05/05/23 131 lb (59.4 kg)  04/01/23 131 lb (59.4 kg)  12/06/22 130 lb 3 oz (59.1 kg)        Assessment & Plan:   Problem List Items Addressed This Visit   None Visit Diagnoses     Pain of left calf    -  Primary   Relevant Orders   D-dimer, quantitative (Completed)   Upper back pain on left side          She is not in any acute distress.  No red flag symptoms.  Symptoms have been intermittent and ongoing for several weeks. No recent injury. Discussed most likely MSK etiology.  Symptoms and exam speak against DVT or PE.  D-dimer ordered.  Follow-up with sports medicine if not improving.  I am having Joanne B. Wesenberg maintain her cholecalciferol, meclizine, Calcium Carbonate-Vit D-Min (CALCIUM 1200 PO), Oscimin,  methocarbamol, metroNIDAZOLE, fluticasone, clindamycin, losartan-hydrochlorothiazide, metoprolol succinate, Potassium Chloride ER, Multiple Vitamin (MULTIVITAMIN ADULT PO), and famotidine.  No orders of the defined types were placed in this encounter.

## 2023-05-09 ENCOUNTER — Encounter: Payer: Self-pay | Admitting: Family Medicine

## 2023-05-11 ENCOUNTER — Other Ambulatory Visit: Payer: Self-pay

## 2023-05-11 DIAGNOSIS — M545 Low back pain, unspecified: Secondary | ICD-10-CM

## 2023-05-16 ENCOUNTER — Other Ambulatory Visit: Payer: 59

## 2023-05-23 ENCOUNTER — Encounter: Payer: Self-pay | Admitting: Family Medicine

## 2023-05-23 ENCOUNTER — Ambulatory Visit (INDEPENDENT_AMBULATORY_CARE_PROVIDER_SITE_OTHER): Payer: 59

## 2023-05-23 ENCOUNTER — Encounter: Payer: Self-pay | Admitting: Cardiology

## 2023-05-23 DIAGNOSIS — M47816 Spondylosis without myelopathy or radiculopathy, lumbar region: Secondary | ICD-10-CM | POA: Diagnosis not present

## 2023-05-23 DIAGNOSIS — M545 Low back pain, unspecified: Secondary | ICD-10-CM

## 2023-05-23 DIAGNOSIS — D1809 Hemangioma of other sites: Secondary | ICD-10-CM | POA: Diagnosis not present

## 2023-05-23 DIAGNOSIS — M5126 Other intervertebral disc displacement, lumbar region: Secondary | ICD-10-CM | POA: Diagnosis not present

## 2023-05-23 DIAGNOSIS — M5136 Other intervertebral disc degeneration, lumbar region: Secondary | ICD-10-CM | POA: Diagnosis not present

## 2023-05-25 ENCOUNTER — Other Ambulatory Visit (HOSPITAL_BASED_OUTPATIENT_CLINIC_OR_DEPARTMENT_OTHER): Payer: Self-pay

## 2023-05-25 MED ORDER — TRIAMCINOLONE ACETONIDE 0.1 % EX CREA
1.0000 | TOPICAL_CREAM | Freq: Two times a day (BID) | CUTANEOUS | 1 refills | Status: AC
  Filled 2023-05-25: qty 30, 15d supply, fill #0

## 2023-06-10 ENCOUNTER — Encounter: Payer: Self-pay | Admitting: Family Medicine

## 2023-06-15 ENCOUNTER — Other Ambulatory Visit (HOSPITAL_BASED_OUTPATIENT_CLINIC_OR_DEPARTMENT_OTHER): Payer: Self-pay

## 2023-06-15 ENCOUNTER — Encounter: Payer: Self-pay | Admitting: Family Medicine

## 2023-06-15 MED ORDER — LOSARTAN POTASSIUM-HCTZ 50-12.5 MG PO TABS
1.0000 | ORAL_TABLET | Freq: Every day | ORAL | 1 refills | Status: DC
  Filled 2023-06-15: qty 90, 90d supply, fill #0

## 2023-06-17 ENCOUNTER — Encounter (INDEPENDENT_AMBULATORY_CARE_PROVIDER_SITE_OTHER): Payer: Self-pay

## 2023-06-23 ENCOUNTER — Encounter: Payer: Self-pay | Admitting: Family Medicine

## 2023-06-24 ENCOUNTER — Other Ambulatory Visit: Payer: Self-pay

## 2023-06-24 DIAGNOSIS — M5416 Radiculopathy, lumbar region: Secondary | ICD-10-CM

## 2023-06-29 DIAGNOSIS — H16223 Keratoconjunctivitis sicca, not specified as Sjogren's, bilateral: Secondary | ICD-10-CM | POA: Diagnosis not present

## 2023-06-29 DIAGNOSIS — H02423 Myogenic ptosis of bilateral eyelids: Secondary | ICD-10-CM | POA: Diagnosis not present

## 2023-06-29 DIAGNOSIS — H40033 Anatomical narrow angle, bilateral: Secondary | ICD-10-CM | POA: Diagnosis not present

## 2023-07-02 ENCOUNTER — Other Ambulatory Visit (HOSPITAL_BASED_OUTPATIENT_CLINIC_OR_DEPARTMENT_OTHER): Payer: Self-pay

## 2023-07-12 NOTE — Discharge Instructions (Signed)

## 2023-07-13 ENCOUNTER — Encounter: Payer: Self-pay | Admitting: Family Medicine

## 2023-07-13 ENCOUNTER — Ambulatory Visit
Admission: RE | Admit: 2023-07-13 | Discharge: 2023-07-13 | Disposition: A | Discharge status: Home / Self Care | Payer: 59 | Source: Ambulatory Visit | Attending: Family Medicine | Admitting: Family Medicine

## 2023-07-13 ENCOUNTER — Other Ambulatory Visit: Payer: 59

## 2023-07-13 ENCOUNTER — Ambulatory Visit (INDEPENDENT_AMBULATORY_CARE_PROVIDER_SITE_OTHER): Payer: 59 | Admitting: Family Medicine

## 2023-07-13 VITALS — BP 102/66 | HR 94 | Temp 97.6°F | Ht 61.0 in | Wt 133.0 lb

## 2023-07-13 DIAGNOSIS — I10 Essential (primary) hypertension: Secondary | ICD-10-CM

## 2023-07-13 DIAGNOSIS — E7800 Pure hypercholesterolemia, unspecified: Secondary | ICD-10-CM | POA: Diagnosis not present

## 2023-07-13 DIAGNOSIS — Z0001 Encounter for general adult medical examination with abnormal findings: Secondary | ICD-10-CM | POA: Diagnosis not present

## 2023-07-13 DIAGNOSIS — M5416 Radiculopathy, lumbar region: Secondary | ICD-10-CM | POA: Diagnosis not present

## 2023-07-13 LAB — COMPREHENSIVE METABOLIC PANEL
ALT: 15 U/L (ref 0–35)
AST: 19 U/L (ref 0–37)
Albumin: 4.1 g/dL (ref 3.5–5.2)
Alkaline Phosphatase: 67 U/L (ref 39–117)
BUN: 12 mg/dL (ref 6–23)
CO2: 32 meq/L (ref 19–32)
Calcium: 9.4 mg/dL (ref 8.4–10.5)
Chloride: 98 meq/L (ref 96–112)
Creatinine, Ser: 0.61 mg/dL (ref 0.40–1.20)
GFR: 97.77 mL/min (ref >60.00)
Glucose, Bld: 88 mg/dL (ref 70–99)
Potassium: 3.6 meq/L (ref 3.5–5.1)
Sodium: 137 meq/L (ref 135–145)
Total Bilirubin: 0.5 mg/dL (ref 0.2–1.2)
Total Protein: 7.6 g/dL (ref 6.0–8.3)

## 2023-07-13 LAB — CBC WITH DIFFERENTIAL/PLATELET
Basophils Absolute: 0 10*3/uL (ref 0.0–0.1)
Basophils Relative: 0.8 % (ref 0.0–3.0)
Eosinophils Absolute: 0.1 10*3/uL (ref 0.0–0.7)
Eosinophils Relative: 1.6 % (ref 0.0–5.0)
HCT: 42.5 % (ref 36.0–46.0)
Hemoglobin: 13.8 g/dL (ref 12.0–15.0)
Lymphocytes Relative: 41 % (ref 12.0–46.0)
Lymphs Abs: 2.3 10*3/uL (ref 0.7–4.0)
MCHC: 32.5 g/dL (ref 30.0–36.0)
MCV: 92.5 fl (ref 78.0–100.0)
Monocytes Absolute: 0.4 10*3/uL (ref 0.1–1.0)
Monocytes Relative: 7.4 % (ref 3.0–12.0)
Neutro Abs: 2.7 10*3/uL (ref 1.4–7.7)
Neutrophils Relative %: 49.2 % (ref 43.0–77.0)
Platelets: 288 10*3/uL (ref 150.0–400.0)
RBC: 4.59 Mil/uL (ref 3.87–5.11)
RDW: 12.6 % (ref 11.5–15.5)
WBC: 5.6 10*3/uL (ref 4.0–10.5)

## 2023-07-13 LAB — LIPID PANEL
Cholesterol: 171 mg/dL (ref 0–200)
HDL: 54.6 mg/dL (ref >39.00)
LDL Cholesterol: 90 mg/dL (ref 0–99)
NonHDL: 115.95
Total CHOL/HDL Ratio: 3
Triglycerides: 130 mg/dL (ref 0.0–149.0)
VLDL: 26 mg/dL (ref 0.0–40.0)

## 2023-07-13 MED ORDER — METHYLPREDNISOLONE ACETATE 40 MG/ML INJ SUSP (RADIOLOG
80.0000 mg | Freq: Once | INTRAMUSCULAR | Status: AC
  Administered 2023-07-13: 80 mg via EPIDURAL

## 2023-07-13 MED ORDER — IOPAMIDOL (ISOVUE-M 200) INJECTION 41%
1.0000 mL | Freq: Once | INTRAMUSCULAR | Status: AC
  Administered 2023-07-13: 1 mL via EPIDURAL

## 2023-07-13 NOTE — Patient Instructions (Signed)
Please go downstairs for labs.   Schedule your pap smear as discussed.

## 2023-07-13 NOTE — Progress Notes (Signed)
Complete physical exam  Patient: Nicole Crosby   DOB: December 21, 1965   57 y.o. Female  MRN: [id number]  Subjective:    Chief Complaint  Patient presents with   Annual Exam    fasting   She is here for a complete physical exam.  Last pap smear done by her PCP more than 3 years ago.   HIV and hep C done at [facility] Maintenance  Topic Date Due   HIV Screening  Never done   Hepatitis C Screening  Never done   Pap Smear  11/17/2021   Flu Shot  01/31/2024*   Mammogram  12/01/2024   DTaP/Tdap/Td vaccine (4 - Td or Tdap) 04/01/2026   Colon Cancer Screening  02/01/2031   Zoster (Shingles) Vaccine  Completed   HPV Vaccine  Aged Out   COVID-19 Vaccine  Discontinued  *Topic was postponed. The date shown is not the original due date.    Wears seatbelt always, uses sunscreen, smoke detectors in home and functioning, does not text while driving, feels safe in home environment.  Depression screening:    07/13/2023    8:02 AM 03/15/2020   10:32 AM 09/20/2019    9:33 AM  Depression screen PHQ 2/9  Decreased Interest 0 0 0  Down, Depressed, Hopeless 0 0 0  PHQ - 2 Score 0 0 0  Altered sleeping   0  Tired, decreased energy   0  Change in appetite   0  Feeling bad or failure about yourself    0  Trouble concentrating   0  Moving slowly or fidgety/restless   0  Suicidal thoughts   0  PHQ-9 Score   0   Anxiety Screening:     No data to display          Vision:Within last year and Dental: No current dental problems and Receives regular dental care  Patient Active Problem List   Diagnosis Date Noted   Incomplete bladder emptying 04/01/2023   Prediabetes 04/01/2023   Hematuria 04/01/2023   Daytime somnolence 04/01/2023   Subacute lumbar radiculopathy 03/16/2023   Basal cell carcinoma 02/10/2021   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   Nausea and vomiting 12/25/2019   Abdominal pain 12/25/2019   Constipation 12/25/2019   Change in bowel habit 12/25/2019    Hypokalemia 09/20/2019   Flexural eczema 05/17/2018   Eyelid dermatitis, allergic/contact 05/17/2018   Medial epicondylitis, right 03/17/2018   Ulnar nerve entrapment at elbow, 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   Vitamin D deficiency 11/03/2017   Shortened PR interval 09/27/2017   Hx of laser iridotomy 09/02/2017   Greater trochanteric bursitis of left hip 05/12/2017   B12 deficiency 01/22/2017   Memory changes 08/28/2016   Nonulcer dyspepsia 02/12/2016   Benign fundic gland polyps of stomach 02/12/2016   Paresthesia 12/17/2015   Bladder spasm 12/13/2014   H/O adenomatous polyp of colon 10/04/2014   Screening for malignant neoplasm of cervix 05/31/2013   Physical exam 05/31/2013   Left L3 lumbar radiculitis 09/23/2012   Neck pain 08/22/2012   Adrenal nodule (HCC) 03/02/2012   Essential hypertension, benign 03/02/2012   IBS (irritable bowel syndrome)    PAC (premature atrial contraction)    Insomnia    GERD (gastroesophageal reflux disease)    HEADACHE 09/21/2008   HEPATIC CYST 08/14/2008   Hyperlipidemia 08/06/2008   Past Medical History:  Diagnosis Date   Allergy    Buspirone, Sulfa, Pantoprazole   Benign fundic gland polyps of stomach  02/12/2016   Chicken pox    Dizziness    Eczema    Functional dyspepsia    GERD (gastroesophageal reflux disease)    H/O adenomatous polyp of colon 10/04/2014   09/2014 - 3 mm adenoma - repeat colonoscopy 2022   Hyperlipidemia    no meds   Hypertension    pt denies   IBS (irritable bowel syndrome)    Insomnia    MEDIAL EPICONDYLITIS, RIGHT 07/17/2010   Nasal turbinate hypertrophy 09/08/2022   Nonulcer dyspepsia 02/12/2016   Other abnormal glucose    Tiny hypodensity left hepatic lobe-CT of abdomen and pelvis 07/26/2008-small 8mm periumblical hernia   PAC (premature atrial contraction)    Palpitations    Tinnitus, bilateral 09/21/2018   Past Surgical History:  Procedure Laterality Date   CESAREAN SECTION  01/2001   COLONOSCOPY     DE  QUERVAIN'S RELEASE  2000   Right wrist area   ESOPHAGOGASTRODUODENOSCOPY     EYE SURGERY  ?2018   Right eye to relieve pressure   Social History   Tobacco Use   Smoking status: Never   Smokeless tobacco: Never  Vaping Use   Vaping status: Never Used  Substance Use Topics   Alcohol use: Not Currently    Comment: Occasional   Drug use: No      Patient Care Team: Avanell Shackleton, NP-C as PCP - General (Family Medicine) Lewayne Bunting, MD as Consulting Physician (Cardiology) Van Clines, MD as Consulting Physician (Neurology)   Outpatient Medications Prior to Visit  Medication Sig   Calcium Carbonate-Vit D-Min (CALCIUM 1200 PO) Take 1,200 Units by mouth daily.   cholecalciferol (VITAMIN D) 1000 UNITS tablet Take 2,000 Units by mouth daily.   famotidine (PEPCID) 20 MG tablet TAKE 1 TABLET BY MOUTH TWICE DAILY   fluticasone (FLONASE) 50 MCG/ACT nasal spray Administer 2 sprays in each nostril daily.   losartan-hydrochlorothiazide (HYZAAR) 50-12.5 MG tablet Take 1 tablet by mouth daily.   meclizine (ANTIVERT) 25 MG tablet Take 25 mg by mouth 2 (two) times daily as needed for dizziness.   methocarbamol (ROBAXIN) 500 MG tablet Take 500 mg by mouth every 6 (six) hours as needed.   metoprolol succinate (TOPROL XL) 25 MG 24 hr tablet Take 1/2 tablet (12.5 mg total) by mouth at bedtime.   Multiple Vitamin (MULTIVITAMIN ADULT PO) Take by mouth.   OSCIMIN 0.125 MG SUBL PLACE 1 TABLET (0.125 MG TOTAL) UNDER THE TONGUE EVERY 4 (FOUR) HOURS AS NEEDED.   Potassium Chloride ER 20 MEQ TBCR Take 1 tablet (20 mEq total) by mouth 2 (two) times daily.   RETIN-A 0.025 % cream    triamcinolone cream (KENALOG) 0.1 % Apply 1 Application topically 2 (two) times daily.   vitamin B-12 (CYANOCOBALAMIN) 250 MCG tablet    clindamycin (CLINDAGEL) 1 % gel Apply 1 Application topically as needed. (Patient not taking: Reported on 07/13/2023)   [DISCONTINUED] metroNIDAZOLE (METROCREAM) 0.75 % cream  (Patient  not taking: Reported on 07/13/2023)   No facility-administered medications prior to visit.    Review of Systems  Constitutional:  Negative for chills, fever and weight loss.  HENT:  Positive for congestion.   Eyes:  Negative for blurred vision and double vision.  Respiratory:  Negative for shortness of breath.   Cardiovascular:  Negative for chest pain, palpitations and leg swelling.  Gastrointestinal:  Positive for abdominal pain. Negative for constipation, diarrhea, nausea and vomiting.  Genitourinary:  Negative for dysuria, frequency and urgency.  Musculoskeletal:  Negative for myalgias.  Neurological:  Negative for dizziness, tingling, focal weakness and headaches.  Psychiatric/Behavioral:  Negative for depression. The patient is not nervous/anxious.        Objective:    BP 102/66 (BP Location: Left Arm, Patient Position: Sitting, Cuff Size: Large)   Pulse 94   Temp 97.6 F (36.4 C) (Temporal)   Ht 5\' 1"  (1.549 m)   Wt 133 lb (60.3 kg)   LMP 11/15/2018   SpO2 99%   BMI 25.13 kg/m  BP Readings from Last 3 Encounters:  07/13/23 102/66  05/05/23 106/70  04/14/23 124/76   Wt Readings from Last 3 Encounters:  07/13/23 133 lb (60.3 kg)  05/05/23 131 lb (59.4 kg)  04/01/23 131 lb (59.4 kg)    Physical Exam Constitutional:      General: She is not in acute distress.    Appearance: She is not ill-appearing.  HENT:     Right Ear: Tympanic membrane, ear canal and external ear normal.     Left Ear: Tympanic membrane, ear canal and external ear normal.     Nose: Congestion present.     Comments: Mask on  (Covid infection)    Mouth/Throat:     Comments: Mask on  Eyes:     Extraocular Movements: Extraocular movements intact.     Conjunctiva/sclera: Conjunctivae normal.     Pupils: Pupils are equal, round, and reactive to light.  Neck:     Thyroid: No thyroid mass, thyromegaly or thyroid tenderness.  Cardiovascular:     Rate and Rhythm: Normal rate and regular rhythm.      Pulses: Normal pulses.     Heart sounds: Normal heart sounds.  Pulmonary:     Effort: Pulmonary effort is normal.     Breath sounds: Normal breath sounds.  Abdominal:     General: Bowel sounds are normal.     Palpations: Abdomen is soft.     Tenderness: There is abdominal tenderness. There is no right CVA tenderness, left CVA tenderness, guarding or rebound.     Comments: Chronic abdominal pain and tenderness  Musculoskeletal:        General: Normal range of motion.     Cervical back: Normal range of motion and neck supple. No tenderness.     Right lower leg: No edema.     Left lower leg: No edema.  Lymphadenopathy:     Cervical: No cervical adenopathy.  Skin:    General: Skin is warm and dry.     Findings: No lesion or rash.     Comments: Scar of forehead with h/o basal cell skin cancer   Neurological:     General: No focal deficit present.     Mental Status: She is alert and oriented to person, place, and time.     Cranial Nerves: No cranial nerve deficit.     Sensory: No sensory deficit.     Motor: No weakness.     Gait: Gait normal.  Psychiatric:        Mood and Affect: Mood normal.        Behavior: Behavior normal.        Thought Content: Thought content normal.      No results found for any visits on 07/13/23.    Assessment & Plan:    Routine Health Maintenance and Physical Exam  Problem List Items Addressed This Visit       Cardiovascular and Mediastinum   Essential hypertension, benign   Relevant Orders  CBC with Differential/Platelet   Comprehensive metabolic panel   Other Visit Diagnoses     Encounter for general adult medical examination with abnormal findings    -  Primary   Elevated LDL cholesterol level       Relevant Orders   Lipid panel      Covid infection last week, improving.  She sees specialists for chronic health issues.  Preventive health care reviewed. Mammogram and colonoscopy UTD. Pap smear 3 years ago. May return here for  pap smear or see her OB/GYN.  Counseling on healthy lifestyle including diet and exercise.  Recommend regular dental and eye exams.  Immunizations reviewed.  Discussed safety.  Follow up pending lab results.     Return in about 1 year (around 07/12/2024).     Hetty Blend, NP-C

## 2023-07-14 ENCOUNTER — Other Ambulatory Visit: Payer: Self-pay | Admitting: Family Medicine

## 2023-07-14 MED ORDER — POTASSIUM CHLORIDE ER 20 MEQ PO TBCR
20.0000 meq | EXTENDED_RELEASE_TABLET | Freq: Two times a day (BID) | ORAL | 1 refills | Status: DC
  Filled 2023-07-14: qty 180, 90d supply, fill #0

## 2023-07-14 NOTE — Telephone Encounter (Signed)
Pt has question regarding potassium. Would you like to refill potassium supplement?

## 2023-07-15 ENCOUNTER — Other Ambulatory Visit (HOSPITAL_BASED_OUTPATIENT_CLINIC_OR_DEPARTMENT_OTHER): Payer: Self-pay

## 2023-07-23 ENCOUNTER — Ambulatory Visit: Payer: 59 | Admitting: Family Medicine

## 2023-08-04 NOTE — Progress Notes (Signed)
HPI: FU coronary calcification. Echocardiogram in August of 2010 revealed normal LV function and no valvular abnormalities. Stress echocardiogram December 2010 normal. CardioNet 2010 showed sinus to sinus tachycardia with rare PAC. Stress echo 6/15 normal. ETT 11/19 negative adequate.  Cardiac CTA February 2022 showed minimal nonobstructive coronary disease and calcium score of 0.7.  Monitor December 2022 showed sinus rhythm with occasional PAC, brief PAT and rare PVC.  Since last seen she has dyspnea with more vigorous activities but not routine activities.  No exertional chest pain.  Occasional palpitations.  Current Outpatient Medications  Medication Sig Dispense Refill   Calcium Carbonate-Vit D-Min (CALCIUM 1200 PO) Take 1,200 Units by mouth daily.     cephALEXin (KEFLEX) 500 MG capsule Take 1 capsule (500 mg total) by mouth 2 (two) times daily for 3 days as needed for UTI 6 capsule 1   cholecalciferol (VITAMIN D) 1000 UNITS tablet Take 2,000 Units by mouth daily.     clindamycin (CLINDAGEL) 1 % gel Apply 1 Application topically as needed.     famotidine (PEPCID) 20 MG tablet TAKE 1 TABLET BY MOUTH TWICE DAILY 60 tablet 5   fluticasone (FLONASE) 50 MCG/ACT nasal spray Administer 2 sprays in each nostril daily. 16 g 5   losartan-hydrochlorothiazide (HYZAAR) 50-12.5 MG tablet Take 1 tablet by mouth daily. 90 tablet 1   meclizine (ANTIVERT) 25 MG tablet Take 25 mg by mouth 2 (two) times daily as needed for dizziness.     methocarbamol (ROBAXIN) 500 MG tablet Take 500 mg by mouth every 6 (six) hours as needed.     metoprolol succinate (TOPROL XL) 25 MG 24 hr tablet Take 1/2 tablet (12.5 mg total) by mouth at bedtime. 45 tablet 3   Multiple Vitamin (MULTIVITAMIN ADULT PO) Take by mouth.     OSCIMIN 0.125 MG SUBL PLACE 1 TABLET (0.125 MG TOTAL) UNDER THE TONGUE EVERY 4 (FOUR) HOURS AS NEEDED. 60 tablet 0   Potassium Chloride ER 20 MEQ TBCR Take 1 tablet (20 mEq total) by mouth 2 (two) times  daily. 180 tablet 1   RETIN-A 0.025 % cream      triamcinolone cream (KENALOG) 0.1 % Apply 1 Application topically 2 (two) times daily. 30 g 1   vitamin B-12 (CYANOCOBALAMIN) 250 MCG tablet 500 mcg.     No current facility-administered medications for this visit.     Past Medical History:  Diagnosis Date   Allergy    Buspirone, Sulfa, Pantoprazole   Benign fundic gland polyps of stomach 02/12/2016   Chicken pox    Dizziness    Eczema    Functional dyspepsia    GERD (gastroesophageal reflux disease)    H/O adenomatous polyp of colon 10/04/2014   09/2014 - 3 mm adenoma - repeat colonoscopy 2022   Hyperlipidemia    no meds   Hypertension    pt denies   IBS (irritable bowel syndrome)    Insomnia    MEDIAL EPICONDYLITIS, RIGHT 07/17/2010   Nasal turbinate hypertrophy 09/08/2022   Nonulcer dyspepsia 02/12/2016   Other abnormal glucose    Tiny hypodensity left hepatic lobe-CT of abdomen and pelvis 07/26/2008-small 8mm periumblical hernia   PAC (premature atrial contraction)    Palpitations    Tinnitus, bilateral 09/21/2018    Past Surgical History:  Procedure Laterality Date   CESAREAN SECTION  01/2001   COLONOSCOPY     DE QUERVAIN'S RELEASE  2000   Right wrist area   ESOPHAGOGASTRODUODENOSCOPY     EYE  SURGERY  ?2018   Right eye to relieve pressure    Social History   Socioeconomic History   Marital status: Married    Spouse name: Not on file   Number of children: 2   Years of education: Not on file   Highest education level: Associate degree: occupational, Scientist, product/process development, or vocational program  Occupational History   Occupation: Xray/CT Theme park manager: [employer]  Tobacco Use   Smoking status: Never   Smokeless tobacco: Never  Vaping Use   Vaping status: Never Used  Substance and Sexual Activity   Alcohol use: Not Currently    Comment: Occasional   Drug use: No   Sexual activity: Yes    Birth control/protection: Post-menopausal  Other Topics Concern   Not  on file  Social History Narrative   Occupation: Engineer, 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    Alcohol Use - yes-wine         Full Time    Married           Social Determinants of Health   Financial Resource Strain: Low Risk  (05/04/2023)   Overall Financial Resource Strain (CARDIA)    Difficulty of Paying Living Expenses: Not hard at all  Food Insecurity: No Food Insecurity (05/04/2023)   Hunger Vital Sign    Worried About Running Out of Food in the Last Year: Never true    Ran Out of Food in the Last Year: Never true  Transportation Needs: No Transportation Needs (05/04/2023)   PRAPARE - Administrator, Civil Service (Medical): No    Lack of Transportation (Non-Medical): No  Physical Activity: Unknown (05/04/2023)   Exercise Vital Sign    Days of Exercise per Week: Patient declined    Minutes of Exercise per Session: Not on file  Stress: No Stress Concern Present (05/04/2023)   [employer] of Occupational Health - Occupational Stress Questionnaire    Feeling of Stress : Only a little  Social Connections: Unknown (05/04/2023)   Social Connection and Isolation Panel [NHANES]    Frequency of Communication with Friends and Family: Never    Frequency of Social Gatherings with Friends and Family: Never    Attends Religious Services: Patient declined    Database administrator or Organizations: No    Attends Engineer, structural: Not on file    Marital Status: Married  Catering manager Violence: Not on file    Family History  Problem Relation Age of Onset   Hypertension Mother    Hyperlipidemia Mother    Fibrocystic breast disease Mother    Hearing loss Mother    Osteopenia Mother    Dementia Mother    Stroke Mother    Prostate cancer Father 66       deceased   Hypertension Father    Diabetes Father    Kidney disease Father    Heart disease Father        MI   Hyperlipidemia Father    Cancer Father    Thyroid disease Sister    Diabetes Sister         pre-diabetes   Hypertension Sister    Arthritis Sister    Hyperlipidemia Sister    Alcohol abuse Sister    Diabetes Sister    Hypertension Sister    Hyperlipidemia Sister    Fibrocystic breast disease Sister    Neuropathy Sister    Hyperlipidemia Sister    Hypertension Sister  Other Brother        unknown   Colon cancer Maternal Uncle        died in late 77's   Diabetes Maternal Grandmother    Prostate cancer Maternal Grandfather    COPD Maternal Grandfather    Diabetes Paternal Grandmother    Rectal cancer Neg Hx    Stomach cancer Neg Hx    Esophageal cancer Neg Hx    Pancreatic cancer Neg Hx     ROS: no fevers or chills, productive cough, hemoptysis, dysphasia, odynophagia, melena, hematochezia, dysuria, hematuria, rash, seizure activity, orthopnea, PND, pedal edema, claudication. Remaining systems are negative.  Physical Exam: Well-developed well-nourished in no acute distress.  Skin is warm and dry.  HEENT is normal.  Neck is supple.  Chest is clear to auscultation with normal expansion.  Cardiovascular exam is regular rate and rhythm.  Abdominal exam nontender or distended. No masses palpated. Extremities show no edema. neuro grossly intact  EKG Interpretation Date/Time:  Wednesday August 18 2023 11:31:03 EDT Ventricular Rate:  77 PR Interval:  118 QRS Duration:  70 QT Interval:  354 QTC Calculation: 400 R Axis:   63  Text Interpretation: Normal sinus rhythm Normal ECG When compared with ECG of 09-Nov-2020 04:47, PREVIOUS ECG IS PRESENT Confirmed by Olga Millers ([id number]) on 08/18/2023 11:39:41 AM    A/P  1 coronary calcification-she did not tolerate Crestor or Zetia.  We discussed trying a different statin and she will consider pravastatin.  2 hypertension-patient's blood pressure is controlled.  However she is concerned about elevated glucose with HCTZ.  We will discontinue HCTZ and potassium.  I will add spironolactone 25 mg daily.  Check  potassium and renal function in 1 week.  Follow blood pressure and adjust regimen as needed.  3 hyperlipidemia-as outlined above she will consider pravastatin and contact us if she is agreeable.  4 palpitations-will continue Toprol at present dose.  Olga Millers, MD

## 2023-08-10 ENCOUNTER — Encounter: Payer: Self-pay | Admitting: Family Medicine

## 2023-08-10 ENCOUNTER — Ambulatory Visit: Payer: 59 | Admitting: Family Medicine

## 2023-08-10 ENCOUNTER — Other Ambulatory Visit (HOSPITAL_COMMUNITY)
Admission: RE | Admit: 2023-08-10 | Discharge: 2023-08-10 | Disposition: A | Discharge status: Home / Self Care | Payer: 59 | Source: Ambulatory Visit | Attending: Family Medicine | Admitting: Family Medicine

## 2023-08-10 VITALS — BP 114/78 | HR 94 | Temp 97.8°F | Ht 61.0 in | Wt 135.0 lb

## 2023-08-10 DIAGNOSIS — Z1239 Encounter for other screening for malignant neoplasm of breast: Secondary | ICD-10-CM

## 2023-08-10 DIAGNOSIS — Z124 Encounter for screening for malignant neoplasm of cervix: Secondary | ICD-10-CM | POA: Diagnosis not present

## 2023-08-10 NOTE — Progress Notes (Signed)
Subjective:     Patient ID: Nicole Crosby, female    DOB: 1966/05/08, 57 y.o.   MRN: [id number]  Chief Complaint  Patient presents with   Gynecologic Exam    Gynecologic Exam Pertinent negatives include no abdominal pain, chills, dysuria, fever, frequency or urgency.    Discussed the use of AI scribe software for clinical note transcription with the patient, who gave verbal consent to proceed.  History of Present Illness         Last pap smear 3 years ago with Dr. Beverely Low.  No recent abnormal pap smear.  C/o vaginal dryness but not concerning.   Last mammogram 11/2022.      Health Maintenance Due  Topic Date Due   HIV Screening  Never done   Hepatitis C Screening  Never done    Past Medical History:  Diagnosis Date   Allergy    Buspirone, Sulfa, Pantoprazole   Benign fundic gland polyps of stomach 02/12/2016   Chicken pox    Dizziness    Eczema    Functional dyspepsia    GERD (gastroesophageal reflux disease)    H/O adenomatous polyp of colon 10/04/2014   09/2014 - 3 mm adenoma - repeat colonoscopy 2022   Hyperlipidemia    no meds   Hypertension    pt denies   IBS (irritable bowel syndrome)    Insomnia    MEDIAL EPICONDYLITIS, RIGHT 07/17/2010   Nasal turbinate hypertrophy 09/08/2022   Nonulcer dyspepsia 02/12/2016   Other abnormal glucose    Tiny hypodensity left hepatic lobe-CT of abdomen and pelvis 07/26/2008-small 8mm periumblical hernia   PAC (premature atrial contraction)    Palpitations    Tinnitus, bilateral 09/21/2018    Past Surgical History:  Procedure Laterality Date   CESAREAN SECTION  01/2001   COLONOSCOPY     DE QUERVAIN'S RELEASE  2000   Right wrist area   ESOPHAGOGASTRODUODENOSCOPY     EYE SURGERY  ?2018   Right eye to relieve pressure    Family History  Problem Relation Age of Onset   Hypertension Mother    Hyperlipidemia Mother    Fibrocystic breast disease Mother    Hearing loss Mother    Osteopenia Mother     Dementia Mother    Stroke Mother    Prostate cancer Father 20       deceased   Hypertension Father    Diabetes Father    Kidney disease Father    Heart disease Father        MI   Hyperlipidemia Father    Cancer Father    Thyroid disease Sister    Diabetes Sister        pre-diabetes   Hypertension Sister    Arthritis Sister    Hyperlipidemia Sister    Alcohol abuse Sister    Diabetes Sister    Hypertension Sister    Hyperlipidemia Sister    Fibrocystic breast disease Sister    Neuropathy Sister    Hyperlipidemia Sister    Hypertension Sister    Other Brother        unknown   Colon cancer Maternal Uncle        died in late 6's   Diabetes Maternal Grandmother    Prostate cancer Maternal Grandfather    COPD Maternal Grandfather    Diabetes Paternal Grandmother    Rectal cancer Neg Hx    Stomach cancer Neg Hx    Esophageal cancer Neg Hx    Pancreatic  cancer Neg Hx     Social History   Socioeconomic History   Marital status: Married    Spouse name: Not on file   Number of children: 2   Years of education: Not on file   Highest education level: Associate degree: occupational, Scientist, product/process development, or vocational program  Occupational History   Occupation: Xray/CT Theme park manager: [employer]  Tobacco Use   Smoking status: Never   Smokeless tobacco: Never  Vaping Use   Vaping status: Never Used  Substance and Sexual Activity   Alcohol use: Not Currently    Comment: Occasional   Drug use: No   Sexual activity: Yes    Birth control/protection: Post-menopausal  Other Topics Concern   Not on file  Social History Narrative   Occupation: Engineer, 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    Alcohol Use - yes-wine         Full Time    Married           Social Determinants of Health   Financial Resource Strain: Low Risk  (05/04/2023)   Overall Financial Resource Strain (CARDIA)    Difficulty of Paying Living Expenses: Not hard at all  Food Insecurity: No Food  Insecurity (05/04/2023)   Hunger Vital Sign    Worried About Running Out of Food in the Last Year: Never true    Ran Out of Food in the Last Year: Never true  Transportation Needs: No Transportation Needs (05/04/2023)   PRAPARE - Administrator, Civil Service (Medical): No    Lack of Transportation (Non-Medical): No  Physical Activity: Unknown (05/04/2023)   Exercise Vital Sign    Days of Exercise per Week: Patient declined    Minutes of Exercise per Session: Not on file  Stress: No Stress Concern Present (05/04/2023)   [employer] of Occupational Health - Occupational Stress Questionnaire    Feeling of Stress : Only a little  Social Connections: Unknown (05/04/2023)   Social Connection and Isolation Panel [NHANES]    Frequency of Communication with Friends and Family: Never    Frequency of Social Gatherings with Friends and Family: Never    Attends Religious Services: Patient declined    Database administrator or Organizations: No    Attends Engineer, structural: Not on file    Marital Status: Married  Catering manager Violence: Not on file    Outpatient Medications Prior to Visit  Medication Sig Dispense Refill   Calcium Carbonate-Vit D-Min (CALCIUM 1200 PO) Take 1,200 Units by mouth daily.     cholecalciferol (VITAMIN D) 1000 UNITS tablet Take 2,000 Units by mouth daily.     clindamycin (CLINDAGEL) 1 % gel Apply 1 Application topically as needed.     famotidine (PEPCID) 20 MG tablet TAKE 1 TABLET BY MOUTH TWICE DAILY 60 tablet 5   fluticasone (FLONASE) 50 MCG/ACT nasal spray Administer 2 sprays in each nostril daily. 16 g 5   losartan-hydrochlorothiazide (HYZAAR) 50-12.5 MG tablet Take 1 tablet by mouth daily. 90 tablet 1   meclizine (ANTIVERT) 25 MG tablet Take 25 mg by mouth 2 (two) times daily as needed for dizziness.     methocarbamol (ROBAXIN) 500 MG tablet Take 500 mg by mouth every 6 (six) hours as needed.     metoprolol succinate (TOPROL XL) 25 MG 24 hr  tablet Take 1/2 tablet (12.5 mg total) by mouth at bedtime. 45 tablet 3   Multiple Vitamin (MULTIVITAMIN ADULT  PO) Take by mouth.     OSCIMIN 0.125 MG SUBL PLACE 1 TABLET (0.125 MG TOTAL) UNDER THE TONGUE EVERY 4 (FOUR) HOURS AS NEEDED. 60 tablet 0   Potassium Chloride ER 20 MEQ TBCR Take 1 tablet (20 mEq total) by mouth 2 (two) times daily. 180 tablet 1   RETIN-A 0.025 % cream      triamcinolone cream (KENALOG) 0.1 % Apply 1 Application topically 2 (two) times daily. 30 g 1   vitamin B-12 (CYANOCOBALAMIN) 250 MCG tablet      No facility-administered medications prior to visit.    Allergies  Allergen Reactions   Buspirone Other (See Comments)     nightmare   Esomeprazole Other (See Comments)    headache   Ezetimibe     Abdominal pain, diarrhea   Lansoprazole     Abdominal pain   Omeprazole     Headache   Proton Pump Inhibitors    Sulfonamide Derivatives Rash     Rash    Review of Systems  Constitutional:  Negative for chills and fever.  Gastrointestinal:  Negative for abdominal pain.  Genitourinary:  Negative for dysuria, frequency and urgency.       Mild vaginal dryness       Objective:    Physical Exam Exam conducted with a chaperone present.  Constitutional:      General: She is not in acute distress.    Appearance: She is not ill-appearing.  Eyes:     Extraocular Movements: Extraocular movements intact.     Conjunctiva/sclera: Conjunctivae normal.  Cardiovascular:     Rate and Rhythm: Normal rate.  Pulmonary:     Effort: Pulmonary effort is normal.  Genitourinary:    General: Normal vulva.     Labia:        Right: No rash, tenderness or lesion.        Left: No rash, tenderness or lesion.      Vagina: Normal.     Cervix: Normal.     Uterus: Not enlarged and not tender.      Adnexa: Right adnexa normal and left adnexa normal.  Musculoskeletal:     Cervical back: Normal range of motion and neck supple.  Skin:    General: Skin is warm and dry.   Neurological:     General: No focal deficit present.     Mental Status: She is alert and oriented to person, place, and time.  Psychiatric:        Mood and Affect: Mood normal.        Behavior: Behavior normal.        Thought Content: Thought content normal.      BP 114/78 (BP Location: Left Arm, Patient Position: Sitting, Cuff Size: Normal)   Pulse 94   Temp 97.8 F (36.6 C) (Temporal)   Ht 5\' 1"  (1.549 m)   Wt 135 lb (61.2 kg)   LMP 11/15/2018   SpO2 98%   BMI 25.51 kg/m  Wt Readings from Last 3 Encounters:  08/10/23 135 lb (61.2 kg)  07/13/23 133 lb (60.3 kg)  05/05/23 131 lb (59.4 kg)       Assessment & Plan:   Problem List Items Addressed This Visit   None Visit Diagnoses     Pap smear for cervical cancer screening    -  Primary   Relevant Orders   Cytology - PAP( [place])   Encounter for breast cancer screening using non-mammogram modality  Returned today as part of her CPE for screening breast and pelvic exams with pap smear. No personal or family hx of cancer.  Declines gynecologist.  Follow up pending results.   I am having Ryn B. Stelzner maintain her cholecalciferol, meclizine, Calcium Carbonate-Vit D-Min (CALCIUM 1200 PO), Oscimin, methocarbamol, fluticasone, clindamycin, metoprolol succinate, Multiple Vitamin (MULTIVITAMIN ADULT PO), famotidine, triamcinolone cream, losartan-hydrochlorothiazide, vitamin B-12, Retin-A, and Potassium Chloride ER.  No orders of the defined types were placed in this encounter.

## 2023-08-10 NOTE — Progress Notes (Unsigned)
[facility] [address] [place] [address] Phone: [phone] Subjective:   Nicole Crosby, am serving as a scribe for Dr. Antoine Primas.  I'm seeing this patient by the request  of:  Avanell Shackleton, NP-C  CC: left side weakness   [id number]  04/14/2023 Patient has had significant difficulties with reviewing patient's chart.  Patient has had the trigeminal nerve disorder, paresthesias, neck pain, left L3 lumbar radiculitis, chronic headaches as well.  Patient has intermittent weakness and has had difficulty with the ulnar nerve entrapment.  Workup so far has shown with an MRI of the brain and a very nonspecific demyelinating in nature greater than a year and a half ago.  At this point I do think I would like to consider the possibility of a repeat MRI of the brain to further evaluate if any demyelinating process could be potentially contributing.  Will also get an MRI of the neck with patient having more radicular symptoms at the moment.  Depending on the findings we can discuss further treatment options.  Follow-up patient is also responded well to steroid previously but will hold at this point with patient willing to get the imaging can consider this if necessary.  If for some reason we do not see any demyelinating pathology I would consider a nerve conduction test to further evaluate.   Updated 08/11/2023 Nicole Crosby is a 57 y.o. female coming in with complaint of neck pain. Lumbar epidural 07/13/2023. Patient states that epidural has been effective.   Also c/o stabbing sharp pain in L side of thoracic spine intermittently. Pain occurring since 2022.   L ankle weakness and pain over ATF. No injury to this area. Pain with PF. Does not want to do formal PT.   L hip pain over GT. Painful to sit in car and sleep on that side.   Last week had pain in R shoulder. Unable to sleep on it. Pain improved this week.   MRI IMPRESSION: MRI head:    Normal brain MRI.  No acute abnormality.   MRI cervical spine:   Mild multilevel degenerative change, greatest at C6-C7 where there is mild right foraminal stenosis and mild canal stenosis.   ? Need for EMG   Past Medical History:  Diagnosis Date   Allergy    Buspirone, Sulfa, Pantoprazole   Benign fundic gland polyps of stomach 02/12/2016   Chicken pox    Dizziness    Eczema    Functional dyspepsia    GERD (gastroesophageal reflux disease)    H/O adenomatous polyp of colon 10/04/2014   09/2014 - 3 mm adenoma - repeat colonoscopy 2022   Hyperlipidemia    no meds   Hypertension    pt denies   IBS (irritable bowel syndrome)    Insomnia    MEDIAL EPICONDYLITIS, RIGHT 07/17/2010   Nasal turbinate hypertrophy 09/08/2022   Nonulcer dyspepsia 02/12/2016   Other abnormal glucose    Tiny hypodensity left hepatic lobe-CT of abdomen and pelvis 07/26/2008-small 8mm periumblical hernia   PAC (premature atrial contraction)    Palpitations    Tinnitus, bilateral 09/21/2018   Past Surgical History:  Procedure Laterality Date   CESAREAN SECTION  01/2001   COLONOSCOPY     DE QUERVAIN'S RELEASE  2000   Right wrist area   ESOPHAGOGASTRODUODENOSCOPY     EYE SURGERY  ?2018   Right eye to relieve pressure   Social History   Socioeconomic History   Marital  status: Married    Spouse name: Not on file   Number of children: 2   Years of education: Not on file   Highest education level: Associate degree: occupational, Scientist, product/process development, or vocational program  Occupational History   Occupation: Xray/CT Theme park manager: [employer]  Tobacco Use   Smoking status: Never   Smokeless tobacco: Never  Vaping Use   Vaping status: Never Used  Substance and Sexual Activity   Alcohol use: Not Currently    Comment: Occasional   Drug use: No   Sexual activity: Yes    Birth control/protection: Post-menopausal  Other Topics Concern   Not on file  Social History Narrative   Occupation: Engineer, 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    Alcohol Use - yes-wine         Full Time    Married           Social Determinants of Health   Financial Resource Strain: Low Risk  (05/04/2023)   Overall Financial Resource Strain (CARDIA)    Difficulty of Paying Living Expenses: Not hard at all  Food Insecurity: No Food Insecurity (05/04/2023)   Hunger Vital Sign    Worried About Running Out of Food in the Last Year: Never true    Ran Out of Food in the Last Year: Never true  Transportation Needs: No Transportation Needs (05/04/2023)   PRAPARE - Administrator, Civil Service (Medical): No    Lack of Transportation (Non-Medical): No  Physical Activity: Unknown (05/04/2023)   Exercise Vital Sign    Days of Exercise per Week: Patient declined    Minutes of Exercise per Session: Not on file  Stress: No Stress Concern Present (05/04/2023)   [employer] of Occupational Health - Occupational Stress Questionnaire    Feeling of Stress : Only a little  Social Connections: Unknown (05/04/2023)   Social Connection and Isolation Panel [NHANES]    Frequency of Communication with Friends and Family: Never    Frequency of Social Gatherings with Friends and Family: Never    Attends Religious Services: Patient declined    Database administrator or Organizations: No    Attends Engineer, structural: Not on file    Marital Status: Married   Allergies  Allergen Reactions   Buspirone Other (See Comments)     nightmare   Esomeprazole Other (See Comments)    headache   Ezetimibe     Abdominal pain, diarrhea   Lansoprazole     Abdominal pain   Omeprazole     Headache   Proton Pump Inhibitors    Sulfonamide Derivatives Rash     Rash   Family History  Problem Relation Age of Onset   Hypertension Mother    Hyperlipidemia Mother    Fibrocystic breast disease Mother    Hearing loss Mother    Osteopenia Mother    Dementia Mother    Stroke Mother    Prostate cancer Father 68        deceased   Hypertension Father    Diabetes Father    Kidney disease Father    Heart disease Father        MI   Hyperlipidemia Father    Cancer Father    Thyroid disease Sister    Diabetes Sister        pre-diabetes   Hypertension Sister    Arthritis Sister    Hyperlipidemia Sister    Alcohol  abuse Sister    Diabetes Sister    Hypertension Sister    Hyperlipidemia Sister    Fibrocystic breast disease Sister    Neuropathy Sister    Hyperlipidemia Sister    Hypertension Sister    Other Brother        unknown   Colon cancer Maternal Uncle        died in late 4's   Diabetes Maternal Grandmother    Prostate cancer Maternal Grandfather    COPD Maternal Grandfather    Diabetes Paternal Grandmother    Rectal cancer Neg Hx    Stomach cancer Neg Hx    Esophageal cancer Neg Hx    Pancreatic cancer Neg Hx      Current Outpatient Medications (Cardiovascular):    losartan-hydrochlorothiazide (HYZAAR) 50-12.5 MG tablet, Take 1 tablet by mouth daily.   metoprolol succinate (TOPROL XL) 25 MG 24 hr tablet, Take 1/2 tablet (12.5 mg total) by mouth at bedtime.  Current Outpatient Medications (Respiratory):    fluticasone (FLONASE) 50 MCG/ACT nasal spray, Administer 2 sprays in each nostril daily.   Current Outpatient Medications (Hematological):    vitamin B-12 (CYANOCOBALAMIN) 250 MCG tablet,   Current Outpatient Medications (Other):    Calcium Carbonate-Vit D-Min (CALCIUM 1200 PO), Take 1,200 Units by mouth daily.   cholecalciferol (VITAMIN D) 1000 UNITS tablet, Take 2,000 Units by mouth daily.   clindamycin (CLINDAGEL) 1 % gel, Apply 1 Application topically as needed.   famotidine (PEPCID) 20 MG tablet, TAKE 1 TABLET BY MOUTH TWICE DAILY   meclizine (ANTIVERT) 25 MG tablet, Take 25 mg by mouth 2 (two) times daily as needed for dizziness.   methocarbamol (ROBAXIN) 500 MG tablet, Take 500 mg by mouth every 6 (six) hours as needed.   Multiple Vitamin (MULTIVITAMIN ADULT PO), Take  by mouth.   OSCIMIN 0.125 MG SUBL, PLACE 1 TABLET (0.125 MG TOTAL) UNDER THE TONGUE EVERY 4 (FOUR) HOURS AS NEEDED.   Potassium Chloride ER 20 MEQ TBCR, Take 1 tablet (20 mEq total) by mouth 2 (two) times daily.   RETIN-A 0.025 % cream,    triamcinolone cream (KENALOG) 0.1 %, Apply 1 Application topically 2 (two) times daily.   Reviewed prior external information including notes and imaging from  primary care provider As well as notes that were available from care everywhere and other healthcare systems.  Past medical history, social, surgical and family history all reviewed in electronic medical record.  No pertanent information unless stated regarding to the chief complaint.   Review of Systems:  No headache, visual changes, nausea, vomiting, diarrhea, constipation, dizziness, abdominal pain, skin rash, fevers, chills, night sweats, weight loss, swollen lymph nodes, body aches, joint swelling, chest pain, shortness of breath, mood changes. POSITIVE muscle aches  Objective  Last menstrual period 11/15/2018.   General: No apparent distress alert and oriented x3 mood and affect normal, dressed appropriately.  HEENT: Pupils equal, extraocular movements intact  Respiratory: Patient's speak in full sentences and does not appear short of breath  Cardiovascular: No lower extremity edema, non tender, no erythema      Impression and Recommendations:    The above documentation has been reviewed and is accurate and complete Judi Saa, DO

## 2023-08-11 ENCOUNTER — Other Ambulatory Visit: Payer: 59

## 2023-08-11 ENCOUNTER — Ambulatory Visit: Payer: 59 | Admitting: Family Medicine

## 2023-08-11 VITALS — BP 118/84 | HR 85 | Ht 61.0 in | Wt 135.0 lb

## 2023-08-11 DIAGNOSIS — M9904 Segmental and somatic dysfunction of sacral region: Secondary | ICD-10-CM | POA: Diagnosis not present

## 2023-08-11 DIAGNOSIS — M7062 Trochanteric bursitis, left hip: Secondary | ICD-10-CM

## 2023-08-11 DIAGNOSIS — M5416 Radiculopathy, lumbar region: Secondary | ICD-10-CM

## 2023-08-11 DIAGNOSIS — M9903 Segmental and somatic dysfunction of lumbar region: Secondary | ICD-10-CM | POA: Diagnosis not present

## 2023-08-11 DIAGNOSIS — M9902 Segmental and somatic dysfunction of thoracic region: Secondary | ICD-10-CM | POA: Diagnosis not present

## 2023-08-11 DIAGNOSIS — M9901 Segmental and somatic dysfunction of cervical region: Secondary | ICD-10-CM | POA: Diagnosis not present

## 2023-08-11 DIAGNOSIS — M9908 Segmental and somatic dysfunction of rib cage: Secondary | ICD-10-CM | POA: Diagnosis not present

## 2023-08-11 DIAGNOSIS — M25572 Pain in left ankle and joints of left foot: Secondary | ICD-10-CM

## 2023-08-11 NOTE — Assessment & Plan Note (Signed)
Has had the left-sided lumbar radiculopathy previously.  Could be potentially contributing to the tightness noted in the hip flexor on the left side.  Discussed which activities to do and which ones to avoid.  Increasing activity slowly.  Attempted osteopathic manipulation today.  Follow-up again in 6 to 8 weeks

## 2023-08-11 NOTE — Patient Instructions (Signed)
Injected hip today Ankle exercises See me again in 6-8 weeks

## 2023-08-11 NOTE — Assessment & Plan Note (Signed)
Left ankle pain does seem to be more secondary to possible subluxation.  Differential includes or lumbar radiculopathy.  Discussed icing regimen and home exercises otherwise.  Discussed proper shoes and over-the-counter orthotics.  Do not think a brace is necessary.  Follow-up with me again in 6 to 8 weeks otherwise

## 2023-08-11 NOTE — Assessment & Plan Note (Signed)
Patient given injection and tolerated the procedure well, discussed icing regimen and home exercises, patient has had this previously and has responded well to an injection previously.  Hopefully that will get some response.  Discussed hip abductor strengthening exercises otherwise.

## 2023-08-12 ENCOUNTER — Encounter: Payer: Self-pay | Admitting: Family Medicine

## 2023-08-12 LAB — CYTOLOGY - PAP
Comment: NEGATIVE
High risk HPV: NEGATIVE

## 2023-08-12 NOTE — Progress Notes (Signed)
Office Visit Note  Patient: Nicole Crosby             Date of Birth: 06-21-1966           MRN: [id number]             PCP: Avanell Shackleton, NP-C Referring: Judi Saa, DO Visit Date: 08/26/2023 Occupation: @GUAROCC @  Subjective:  Pain in multiple joints  History of Present Illness: Nicole Crosby is a 57 y.o. female seen in consultation at the request of Dr. Katrinka Blazing.  According the patient she started having discomfort in her left ankle joint in 2021.  She was seen by her PCP and was offered physical therapy but she had no time for physical therapy.  She states gradually she started feeling better ankle joint is started giving out on her.  She was evaluated by Dr. Katrinka Blazing recently who did manipulations which relieved the crepitus.  Patient states recently she has been also having some discomfort in her right ankle joint.  She has never seen visible swelling.  She has some stiffness and discomfort in her bilateral hands and her bilateral feet.  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  She has intermittent discomfort in her shoulders.  She has had recurrent left trochanteric bursitis.  She had left trochanteric bursa injection about 2 weeks ago which lasted for 2 days and the symptoms recurred.  She has never had physical therapy.  She has been also having neck pain and discomfort for the last few years.  She states these episodes happen every 3 to 4 months and last for about 2 to 3 days.  She has had cervical spine injections many years ago.  She also has lower back pain for the last few years.  She has been evaluated by neurology, orthopedics and neurosurgery.  She was told that she has degenerative disc disease.  She had epidural injection by Dr. Karin Golden recently which relieved her left-sided radiculopathy.  None of the other joints are painful.  She denies any history of swelling.  Her maternal aunt has rheumatoid  arthritis.  She is gravida 2, para 2.    Activities of Daily Living:  Patient reports morning stiffness for 0 minute.   Patient Reports nocturnal pain.  Difficulty dressing/grooming: Denies Difficulty climbing stairs: Denies Difficulty getting out of chair: Denies Difficulty using hands for taps, buttons, cutlery, and/or writing: Reports  Review of Systems  Constitutional:  Negative for fatigue.  HENT:  Positive for mouth sores. Negative for mouth dryness.   Eyes:  Positive for dryness.  Respiratory:  Negative for shortness of breath.   Cardiovascular:  Positive for palpitations. Negative for chest pain.  Gastrointestinal:  Negative for blood in stool, constipation and diarrhea.  Endocrine: Negative for increased urination.  Genitourinary:  Negative for involuntary urination.  Musculoskeletal:  Positive for joint pain, joint pain, muscle weakness and muscle tenderness. Negative for gait problem, joint swelling, myalgias, morning stiffness and myalgias.  Skin:  Negative for color change, rash, hair loss and sensitivity to sunlight.  Allergic/Immunologic: Negative for susceptible to infections.  Neurological:  Positive for dizziness and headaches.  Hematological:  Negative for swollen glands.  Psychiatric/Behavioral:  Negative for depressed mood and sleep disturbance. The patient is not nervous/anxious.     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   Nausea and vomiting 12/25/2019   Abdominal pain 12/25/2019   Constipation 12/25/2019   Change in bowel habit 12/25/2019   Hypokalemia 09/20/2019   Flexural eczema 05/17/2018   Eyelid dermatitis, allergic/contact 05/17/2018   Medial  epicondylitis, right 03/17/2018   Ulnar nerve entrapment at elbow, 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   Vitamin D deficiency 11/03/2017   Shortened PR interval 09/27/2017   Hx of laser iridotomy 09/02/2017   Greater trochanteric bursitis of left hip 05/12/2017   B12 deficiency 01/22/2017   Memory changes 08/28/2016   Nonulcer dyspepsia 02/12/2016   Benign fundic gland polyps of stomach 02/12/2016   Paresthesia 12/17/2015   Bladder spasm 12/13/2014   H/O adenomatous polyp of colon 10/04/2014   Screening for malignant neoplasm of cervix 05/31/2013   Physical exam 05/31/2013   Left L3 lumbar radiculitis 09/23/2012   Neck pain 08/22/2012   Adrenal nodule (HCC) 03/02/2012   Essential hypertension, benign 03/02/2012   IBS (irritable bowel syndrome)    PAC (premature atrial contraction)    Insomnia    GERD (gastroesophageal reflux disease)    Headache 09/21/2008   HEPATIC CYST 08/14/2008   Hyperlipidemia 08/06/2008    Past Medical History:  Diagnosis Date   Allergy    Buspirone, Sulfa, Pantoprazole   Benign fundic gland polyps of stomach 02/12/2016   Chicken pox    Dizziness    Eczema    Functional dyspepsia    GERD (gastroesophageal reflux disease)    H/O adenomatous polyp of colon 10/04/2014   09/2014 - 3 mm adenoma - repeat colonoscopy 2022   Hyperlipidemia    no meds   Hypertension    pt denies   IBS (irritable bowel syndrome)    Insomnia    MEDIAL EPICONDYLITIS, RIGHT 07/17/2010   Nasal turbinate hypertrophy 09/08/2022   Nonulcer dyspepsia 02/12/2016   Other abnormal glucose    Tiny hypodensity left hepatic lobe-CT of abdomen and pelvis 07/26/2008-small 8mm periumblical hernia   PAC (premature atrial contraction)    Palpitations    Tinnitus, bilateral 09/21/2018    Family History  Problem Relation Age of Onset   Hypertension Mother    Hyperlipidemia Mother    Fibrocystic breast disease Mother    Hearing loss Mother    Osteopenia Mother    Dementia Mother    Stroke Mother     Prostate cancer Father 5       deceased   Hypertension Father    Diabetes Father    Kidney disease Father    Heart disease Father        MI   Hyperlipidemia Father    Cancer Father    Thyroid disease Sister    Diabetes Sister        pre-diabetes   Hypertension Sister    Arthritis Sister    Hyperlipidemia Sister    Alcohol abuse Sister    Diabetes Sister    Hypertension Sister    Hyperlipidemia Sister    Fibrocystic breast disease Sister    Neuropathy Sister    Hyperlipidemia Sister    Hypertension Sister    Other Brother        unknown   Colon cancer Maternal Uncle        died in late 72's   Diabetes Maternal Grandmother    Prostate cancer  Maternal Grandfather    COPD Maternal Grandfather    Diabetes Paternal Grandmother    Rectal cancer Neg Hx    Stomach cancer Neg Hx    Esophageal cancer Neg Hx    Pancreatic cancer Neg Hx    Past Surgical History:  Procedure Laterality Date   CESAREAN SECTION  01/2001   COLONOSCOPY     DE QUERVAIN'S RELEASE  2000   Right wrist area   ESOPHAGOGASTRODUODENOSCOPY     EYE SURGERY  ?2018   Right eye to relieve pressure   Social History   Social History Narrative   Occupation: Engineer, 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    Alcohol Use - yes-wine         Full Time    Married           Immunization History  Administered Date(s) Administered   Influenza Whole 08/31/2006, 08/20/2009   Influenza,inj,Quad PF,6+ Mos 08/02/2013, 08/02/2014, 08/05/2015, 08/22/2016, 08/02/2017, 08/17/2018, 08/20/2021, 07/24/2022   Influenza-Unspecified 07/20/2017   PFIZER(Purple Top)SARS-COV-2 Vaccination 11/17/2019, 12/08/2019   Td 01/04/2006, 04/01/2016   Tdap 04/01/2016   Zoster Recombinant(Shingrix) 09/19/2021, 12/05/2021   Zoster, Live 09/19/2021, 12/05/2021     Objective: Vital Signs: BP 138/84 (BP Location: Right Arm, Patient Position: Sitting, Cuff Size: Normal)   Pulse 87   Resp 14   Ht 5\' 1"  (1.549 m)   Wt 137 lb (62.1 kg)    LMP 11/15/2018   BMI 25.89 kg/m    Physical Exam Vitals and nursing note reviewed.  Constitutional:      Appearance: She is well-developed.  HENT:     Head: Normocephalic and atraumatic.  Eyes:     Conjunctiva/sclera: Conjunctivae normal.  Cardiovascular:     Rate and Rhythm: Normal rate and regular rhythm.     Heart sounds: Normal heart sounds.  Pulmonary:     Effort: Pulmonary effort is normal.     Breath sounds: Normal breath sounds.  Abdominal:     General: Bowel sounds are normal.     Palpations: Abdomen is soft.  Musculoskeletal:     Cervical back: Normal range of motion.  Lymphadenopathy:     Cervical: No cervical adenopathy.  Skin:    General: Skin is warm and dry.     Capillary Refill: Capillary refill takes less than 2 seconds.  Neurological:     Mental Status: She is alert and oriented to person, place, and time.  Psychiatric:        Behavior: Behavior normal.      Musculoskeletal Exam: Cervical spine was in good range of motion with some stiffness on the left side.  Thoracic and lumbar spine were in good range of motion without any point tenderness.  There was no SI joint tenderness.  Shoulder joints, elbow joints, wrist joints, MCPs PIPs and DIPs with good range of motion with no synovitis.  Hip joints were in good range of motion.  Knee joints were in good range of motion without any warmth swelling or effusion.  There was no synovitis or tenderness over ankle joints.  There was no tenderness over MTP joints.  CDAI Exam: CDAI Score: -- Patient Global: --; Provider Global: -- Swollen: --; Tender: -- Joint Exam 08/26/2023   No joint exam has been documented for this visit   There is currently no information documented on the homunculus. Go to the Rheumatology activity and complete the homunculus joint exam.  Investigation: No additional findings.  Imaging: [place] LIMITED JOINT  SPACE STRUCTURES LOW LEFT(NO LINKED CHARGES)  Result Date:  08/12/2023 Procedure: Real-time Ultrasound Guided Injection of left  greater trochanteric bursitis secondary to patient's body habitus Device: GE Logiq Q7 Ultrasound guided injection is preferred based studies that show increased duration, increased effect, greater accuracy, decreased procedural pain, increased response rate, and decreased cost with ultrasound guided versus blind injection. Verbal informed consent obtained. Time-out conducted. Noted no overlying erythema, induration, or other signs of local infection. Skin prepped in a sterile fashion. Local anesthesia: Topical Ethyl chloride. With sterile technique and under real time ultrasound guidance:  Greater trochanteric area was visualized and patient's bursa was noted. A 22-gauge 3 inch needle was inserted and 4 cc of 0.5% Marcaine and 1 cc of Kenalog 40 mg/dL was injected. Pictures taken Completed without difficulty Pain immediately resolved suggesting accurate placement of the medication. Advised to call if fevers/chills, erythema, induration, drainage, or persistent bleeding. Impression: Technically successful ultrasound guided injection.    Recent Labs: Lab Results  Component Value Date   WBC 5.6 07/13/2023   HGB 13.8 07/13/2023   PLT 288.0 07/13/2023   NA 137 07/13/2023   K 3.6 07/13/2023   CL 98 07/13/2023   CO2 32 07/13/2023   GLUCOSE 88 07/13/2023   BUN 12 07/13/2023   CREATININE 0.61 07/13/2023   BILITOT 0.5 07/13/2023   ALKPHOS 67 07/13/2023   AST 19 07/13/2023   ALT 15 07/13/2023   PROT 7.6 07/13/2023   ALBUMIN 4.1 07/13/2023   CALCIUM 9.4 07/13/2023   GFRAA >60 12/07/2019   Mar 16, 2023 ANA 1: 40 NH, RF 17, anti-CCP negative, CRP<1.0, ESR 21, TSH normal, uric acid 4.6, vitamin D 38.04, B12 normal, calcium 9.5, PTH 46  Speciality Comments: No specialty comments available.  Procedures:  No procedures performed Allergies: Buspirone, Esomeprazole, Ezetimibe, Lansoprazole, Omeprazole, Proton pump inhibitors, and  Sulfonamide derivatives   Assessment / Plan:     Visit Diagnoses: Polyarthralgia -patient complains of discomfort in multiple joints.  There is no history of joint swelling.  No synovitis was noted on the examination today.  Mar 16, 2023 ANA 1: 40 NH, RF 17, anti-CCP negative, CRP<1.0, ESR 21, uric acid 4.6  Rheumatoid factor positive-she is low titer positive ANA which is not significant.  She has low titer positive rheumatoid factor.  No synovitis was noted on the examination.  Advised patient to contact me if she develops any increased swelling.  Pain in both hands -she complains of pain and stiffness in her bilateral hands.  No synovitis was noted on the examination.  She had bilateral PIP and DIP thickening consistent with osteoarthritis.  Joint protection muscle strengthening was discussed.  A handout on hand exercises was given.  Plan: XR Hand 2 View Right, XR Hand 2 View Left.  Early degenerative changes were noted in the x-rays.  Medial epicondylitis, right-resolved.  Trochanteric bursitis, left hip -she has recurrent left trochanteric bursitis over 25 years.  She was recently evaluated by Dr. Katrinka Blazing.  She had left trochanteric bursa injection which lasted for 2 days.  She does not have time for physical therapy.  Some of the exercises were demonstrated in the office and a handout was given for exercises.  Chronic pain of both ankles -she gives history of instability in her ankles and discomfort.  She appears to have hypermobility in her joints.  According to Dr. Michaelle Copas notes patient has possible subluxation of the ankle. - Plan: XR Ankle 2 Views Right, XR Ankle 2 Views Left.  X-rays  of the ankle joints were unremarkable.  Pain in both feet -she has intermittent discomfort in her feet.  No synovitis was noted on the examination.  Plan: XR Foot 2 Views Right, XR Foot 2 Views Left.  Early degenerative changes were noted in the feet.  DDD (degenerative disc disease), cervical -she gives  history of intermittent neck pain which happens about every 3 to 4 months and last for 2 to 3 days.  The pain usually responds to application of heat.  May 04, 2023 MRI of the cervical spine showed mild progressive spondylosis at C6-C7.  Mild foraminal stenosis and mild spinal stenosis.  Patient had physical therapy in the past.  She was advised to continue to do exercises.  Spondylosis without myelopathy or radiculopathy, lumbar region -she has chronic lower back pain.  Patient states that she was having left-sided radiculopathy which resolved after epidural injection given by Dr. Karin Golden 2 weeks ago.  May 23, 2023 MRI of the lumbar spine showed multilevel mild spondylosis.  Old strengthening exercises were demonstrated in the office and a handout was given.  She will benefit from water aerobics and swimming.  Other medical problems are listed as follows:  Mixed hyperlipidemia  Essential hypertension, benign  Gastroesophageal reflux disease without esophagitis  Other irritable bowel syndrome  H/O adenomatous polyp of colon  PAC (premature atrial contraction)  Prediabetes  Vitamin D deficiency - Mar 16, 2023 vitamin D38.04  B12 deficiency  Flexural eczema  Adrenal nodule (HCC)  Primary insomnia  History of headache  Orders: Orders Placed This Encounter  Procedures   XR Hand 2 View Right   XR Hand 2 View Left   XR Foot 2 Views Right   XR Foot 2 Views Left   XR Ankle 2 Views Right   XR Ankle 2 Views Left   No orders of the defined types were placed in this encounter.    Follow-Up Instructions: Return in about 6 months (around 02/24/2024) for +RF, OA.   Pollyann Savoy, MD  Note - This record has been created using Animal nutritionist.  Chart creation errors have been sought, but may not always  have been located. Such creation errors do not reflect on  the standard of medical care.

## 2023-08-16 ENCOUNTER — Other Ambulatory Visit (HOSPITAL_BASED_OUTPATIENT_CLINIC_OR_DEPARTMENT_OTHER): Payer: Self-pay

## 2023-08-16 DIAGNOSIS — R351 Nocturia: Secondary | ICD-10-CM | POA: Diagnosis not present

## 2023-08-16 DIAGNOSIS — R3915 Urgency of urination: Secondary | ICD-10-CM | POA: Diagnosis not present

## 2023-08-16 DIAGNOSIS — N3021 Other chronic cystitis with hematuria: Secondary | ICD-10-CM | POA: Diagnosis not present

## 2023-08-16 DIAGNOSIS — R3129 Other microscopic hematuria: Secondary | ICD-10-CM | POA: Diagnosis not present

## 2023-08-16 MED ORDER — CEPHALEXIN 500 MG PO CAPS
500.0000 mg | ORAL_CAPSULE | Freq: Two times a day (BID) | ORAL | 1 refills | Status: DC
  Filled 2023-08-16: qty 6, 3d supply, fill #0

## 2023-08-17 ENCOUNTER — Other Ambulatory Visit (HOSPITAL_BASED_OUTPATIENT_CLINIC_OR_DEPARTMENT_OTHER): Payer: Self-pay

## 2023-08-18 ENCOUNTER — Ambulatory Visit: Payer: 59 | Attending: Cardiology | Admitting: Cardiology

## 2023-08-18 ENCOUNTER — Other Ambulatory Visit (HOSPITAL_BASED_OUTPATIENT_CLINIC_OR_DEPARTMENT_OTHER): Payer: Self-pay

## 2023-08-18 ENCOUNTER — Encounter: Payer: Self-pay | Admitting: Cardiology

## 2023-08-18 VITALS — BP 111/73 | HR 77 | Ht 61.0 in | Wt 134.0 lb

## 2023-08-18 DIAGNOSIS — I10 Essential (primary) hypertension: Secondary | ICD-10-CM | POA: Diagnosis not present

## 2023-08-18 DIAGNOSIS — R931 Abnormal findings on diagnostic imaging of heart and coronary circulation: Secondary | ICD-10-CM | POA: Diagnosis not present

## 2023-08-18 DIAGNOSIS — E785 Hyperlipidemia, unspecified: Secondary | ICD-10-CM | POA: Diagnosis not present

## 2023-08-18 DIAGNOSIS — R002 Palpitations: Secondary | ICD-10-CM

## 2023-08-18 DIAGNOSIS — Z09 Encounter for follow-up examination after completed treatment for conditions other than malignant neoplasm: Secondary | ICD-10-CM

## 2023-08-18 MED ORDER — SPIRONOLACTONE 25 MG PO TABS
25.0000 mg | ORAL_TABLET | Freq: Every day | ORAL | 3 refills | Status: DC
Start: 2023-08-18 — End: 2023-09-10
  Filled 2023-08-18: qty 90, 90d supply, fill #0

## 2023-08-18 NOTE — Patient Instructions (Signed)
Medication Instructions:   STOP LOSARTAN/HCT  STOP POTASSIUM  START SPIRONOLACTONE 25 MG ONCE DAILY  *If you need a refill on your cardiac medications before your next appointment, please call your pharmacy*   Lab Work:  Your physician recommends that you return for lab work in: ONE WEEK-DO NOT NEED TO FAST  If you have labs (blood work) drawn today and your tests are completely normal, you will receive your results only by: MyChart Message (if you have MyChart) OR A paper copy in the mail If you have any lab test that is abnormal or we need to change your treatment, we will call you to review the results.   Follow-Up: At [facility], you and your health needs are our priority.  As part of our continuing mission to provide you with exceptional heart care, we have created designated Provider Care Teams.  These Care Teams include your primary Cardiologist (physician) and Advanced Practice Providers (APPs -  Physician Assistants and Nurse Practitioners) who all work together to provide you with the care you need, when you need it.  We recommend signing up for the patient portal called "MyChart".  Sign up information is provided on this After Visit Summary.  MyChart is used to connect with patients for Virtual Visits (Telemedicine).  Patients are able to view lab/test results, encounter notes, upcoming appointments, etc.  Non-urgent messages can be sent to your provider as well.   To learn more about what you can do with MyChart, go to [email or web address].    Your next appointment:   12 month(s)  Provider:   Olga Millers MD

## 2023-08-23 ENCOUNTER — Other Ambulatory Visit (HOSPITAL_BASED_OUTPATIENT_CLINIC_OR_DEPARTMENT_OTHER): Payer: Self-pay

## 2023-08-23 MED ORDER — CEPHALEXIN 500 MG PO CAPS
500.0000 mg | ORAL_CAPSULE | Freq: Every day | ORAL | 1 refills | Status: DC | PRN
  Filled 2023-08-23: qty 60, 60d supply, fill #0

## 2023-08-24 ENCOUNTER — Other Ambulatory Visit (HOSPITAL_BASED_OUTPATIENT_CLINIC_OR_DEPARTMENT_OTHER): Payer: Self-pay

## 2023-08-25 ENCOUNTER — Other Ambulatory Visit (HOSPITAL_BASED_OUTPATIENT_CLINIC_OR_DEPARTMENT_OTHER): Payer: Self-pay

## 2023-08-26 ENCOUNTER — Encounter: Payer: Self-pay | Admitting: Rheumatology

## 2023-08-26 ENCOUNTER — Ambulatory Visit: Payer: 59

## 2023-08-26 ENCOUNTER — Other Ambulatory Visit (HOSPITAL_BASED_OUTPATIENT_CLINIC_OR_DEPARTMENT_OTHER): Payer: Self-pay

## 2023-08-26 ENCOUNTER — Ambulatory Visit: Payer: 59 | Attending: Rheumatology | Admitting: Rheumatology

## 2023-08-26 VITALS — BP 138/84 | HR 87 | Resp 14 | Ht 61.0 in | Wt 137.0 lb

## 2023-08-26 DIAGNOSIS — M5030 Other cervical disc degeneration, unspecified cervical region: Secondary | ICD-10-CM

## 2023-08-26 DIAGNOSIS — M5442 Lumbago with sciatica, left side: Secondary | ICD-10-CM | POA: Diagnosis not present

## 2023-08-26 DIAGNOSIS — M7701 Medial epicondylitis, right elbow: Secondary | ICD-10-CM | POA: Diagnosis not present

## 2023-08-26 DIAGNOSIS — M2550 Pain in unspecified joint: Secondary | ICD-10-CM

## 2023-08-26 DIAGNOSIS — M79671 Pain in right foot: Secondary | ICD-10-CM | POA: Diagnosis not present

## 2023-08-26 DIAGNOSIS — M47816 Spondylosis without myelopathy or radiculopathy, lumbar region: Secondary | ICD-10-CM | POA: Diagnosis not present

## 2023-08-26 DIAGNOSIS — E538 Deficiency of other specified B group vitamins: Secondary | ICD-10-CM

## 2023-08-26 DIAGNOSIS — M79642 Pain in left hand: Secondary | ICD-10-CM | POA: Diagnosis not present

## 2023-08-26 DIAGNOSIS — L2082 Flexural eczema: Secondary | ICD-10-CM

## 2023-08-26 DIAGNOSIS — E559 Vitamin D deficiency, unspecified: Secondary | ICD-10-CM

## 2023-08-26 DIAGNOSIS — M79672 Pain in left foot: Secondary | ICD-10-CM

## 2023-08-26 DIAGNOSIS — M25571 Pain in right ankle and joints of right foot: Secondary | ICD-10-CM

## 2023-08-26 DIAGNOSIS — E782 Mixed hyperlipidemia: Secondary | ICD-10-CM

## 2023-08-26 DIAGNOSIS — Z860101 Personal history of adenomatous and serrated colon polyps: Secondary | ICD-10-CM

## 2023-08-26 DIAGNOSIS — M7062 Trochanteric bursitis, left hip: Secondary | ICD-10-CM

## 2023-08-26 DIAGNOSIS — M25572 Pain in left ankle and joints of left foot: Secondary | ICD-10-CM

## 2023-08-26 DIAGNOSIS — R768 Other specified abnormal immunological findings in serum: Secondary | ICD-10-CM

## 2023-08-26 DIAGNOSIS — G8929 Other chronic pain: Secondary | ICD-10-CM

## 2023-08-26 DIAGNOSIS — E279 Disorder of adrenal gland, unspecified: Secondary | ICD-10-CM

## 2023-08-26 DIAGNOSIS — F5101 Primary insomnia: Secondary | ICD-10-CM

## 2023-08-26 DIAGNOSIS — K588 Other irritable bowel syndrome: Secondary | ICD-10-CM

## 2023-08-26 DIAGNOSIS — M79641 Pain in right hand: Secondary | ICD-10-CM | POA: Diagnosis not present

## 2023-08-26 DIAGNOSIS — I10 Essential (primary) hypertension: Secondary | ICD-10-CM

## 2023-08-26 DIAGNOSIS — K219 Gastro-esophageal reflux disease without esophagitis: Secondary | ICD-10-CM

## 2023-08-26 DIAGNOSIS — I491 Atrial premature depolarization: Secondary | ICD-10-CM

## 2023-08-26 DIAGNOSIS — Z87898 Personal history of other specified conditions: Secondary | ICD-10-CM

## 2023-08-26 DIAGNOSIS — R7303 Prediabetes: Secondary | ICD-10-CM

## 2023-08-26 DIAGNOSIS — Z8261 Family history of arthritis: Secondary | ICD-10-CM

## 2023-08-26 NOTE — Patient Instructions (Signed)
Hand Exercises Hand exercises can be helpful for almost anyone. They can strengthen your hands and improve flexibility and movement. The exercises can also increase blood flow to the hands. These results can make your work and daily tasks easier for you. Hand exercises can be especially helpful for people who have joint pain from arthritis or nerve damage from using their hands over and over. These exercises can also help people who injure a hand. Exercises Most of these hand exercises are gentle stretching and motion exercises. It is usually safe to do them often throughout the day. Warming up your hands before exercise may help reduce stiffness. You can do this with gentle massage or by placing your hands in warm water for 10-15 minutes. It is normal to feel some stretching, pulling, tightness, or mild discomfort when you begin new exercises. In time, this will improve. Remember to always be careful and stop right away if you feel sudden, very bad pain or your pain gets worse. You want to get better and be safe. Ask your health care provider which exercises are safe for you. Do exercises exactly as told by your provider and adjust them as told. Do not begin these exercises until told by your provider. Knuckle bend or "claw" fist  Stand or sit with your arm, hand, and all five fingers pointed straight up. Make sure to keep your wrist straight. Gently bend your fingers down toward your palm until the tips of your fingers are touching your palm. Keep your big knuckle straight and only bend the small knuckles in your fingers. Hold this position for 10 seconds. Straighten your fingers back to your starting position. Repeat this exercise 5-10 times with each hand. Full finger fist  Stand or sit with your arm, hand, and all five fingers pointed straight up. Make sure to keep your wrist straight. Gently bend your fingers into your palm until the tips of your fingers are touching the middle of your  palm. Hold this position for 10 seconds. Extend your fingers back to your starting position, stretching every joint fully. Repeat this exercise 5-10 times with each hand. Straight fist  Stand or sit with your arm, hand, and all five fingers pointed straight up. Make sure to keep your wrist straight. Gently bend your fingers at the big knuckle, where your fingers meet your hand, and at the middle knuckle. Keep the knuckle at the tips of your fingers straight and try to touch the bottom of your palm. Hold this position for 10 seconds. Extend your fingers back to your starting position, stretching every joint fully. Repeat this exercise 5-10 times with each hand. Tabletop  Stand or sit with your arm, hand, and all five fingers pointed straight up. Make sure to keep your wrist straight. Gently bend your fingers at the big knuckle, where your fingers meet your hand, as far down as you can. Keep the small knuckles in your fingers straight. Think of forming a tabletop with your fingers. Hold this position for 10 seconds. Extend your fingers back to your starting position, stretching every joint fully. Repeat this exercise 5-10 times with each hand. Finger spread  Place your hand flat on a table with your palm facing down. Make sure your wrist stays straight. Spread your fingers and thumb apart from each other as far as you can until you feel a gentle stretch. Hold this position for 10 seconds. Bring your fingers and thumb tight together again. Hold this position for 10 seconds. Repeat  this exercise 5-10 times with each hand. Making circles  Stand or sit with your arm, hand, and all five fingers pointed straight up. Make sure to keep your wrist straight. Make a circle by touching the tip of your thumb to the tip of your index finger. Hold for 10 seconds. Then open your hand wide. Repeat this motion with your thumb and each of your fingers. Repeat this exercise 5-10 times with each hand. Thumb  motion  Sit with your forearm resting on a table and your wrist straight. Your thumb should be facing up toward the ceiling. Keep your fingers relaxed as you move your thumb. Lift your thumb up as high as you can toward the ceiling. Hold for 10 seconds. Bend your thumb across your palm as far as you can, reaching the tip of your thumb for the small finger (pinkie) side of your palm. Hold for 10 seconds. Repeat this exercise 5-10 times with each hand. Grip strengthening  Hold a stress ball or other soft ball in the middle of your hand. Slowly increase the pressure, squeezing the ball as much as you can without causing pain. Think of bringing the tips of your fingers into the middle of your palm. All of your finger joints should bend when doing this exercise. Hold your squeeze for 10 seconds, then relax. Repeat this exercise 5-10 times with each hand. Contact a health care provider if: Your hand pain or discomfort gets much worse when you do an exercise. Your hand pain or discomfort does not improve within 2 hours after you exercise. If you have either of these problems, stop doing these exercises right away. Do not do them again unless your provider says that you can. Get help right away if: You develop sudden, severe hand pain or swelling. If this happens, stop doing these exercises right away. Do not do them again unless your provider says that you can. This information is not intended to replace advice given to you by your health care provider. Make sure you discuss any questions you have with your health care provider. Document Revised: 11/03/2022 Document Reviewed: 11/03/2022 [employer] Patient Education  2024 [employer] Low Back Sprain or Strain Rehab Ask your health care provider which exercises are safe for you. Do exercises exactly as told by your health care provider and adjust them as directed. It is normal to feel mild stretching, pulling, tightness, or discomfort as you do these  exercises. Stop right away if you feel sudden pain or your pain gets worse. Do not begin these exercises until told by your health care provider. Stretching and range-of-motion exercises These exercises warm up your muscles and joints and improve the movement and flexibility of your back. These exercises also help to relieve pain, numbness, and tingling. Lumbar rotation  Lie on your back on a firm bed or the floor with your knees bent. Straighten your arms out to your sides so each arm forms a 90-degree angle (right angle) with a side of your body. Slowly move (rotate) both of your knees to one side of your body until you feel a stretch in your lower back (lumbar). Try not to let your shoulders lift off the floor. Hold this position for __________ seconds. Tense your abdominal muscles and slowly move your knees back to the starting position. Repeat this exercise on the other side of your body. Repeat __________ times. Complete this exercise __________ times a day. Single knee to chest  Lie on your back on a  firm bed or the floor with both legs straight. Bend one of your knees. Use your hands to move your knee up toward your chest until you feel a gentle stretch in your lower back and buttock. Hold your leg in this position by holding on to the front of your knee. Keep your other leg as straight as possible. Hold this position for __________ seconds. Slowly return to the starting position. Repeat with your other leg. Repeat __________ times. Complete this exercise __________ times a day. Prone extension on elbows  Lie on your abdomen on a firm bed or the floor (prone position). Prop yourself up on your elbows. Use your arms to help lift your chest up until you feel a gentle stretch in your abdomen and your lower back. This will place some of your body weight on your elbows. If this is uncomfortable, try stacking pillows under your chest. Your hips should stay down, against the surface that  you are lying on. Keep your hip and back muscles relaxed. Hold this position for __________ seconds. Slowly relax your upper body and return to the starting position. Repeat __________ times. Complete this exercise __________ times a day. Strengthening exercises These exercises build strength and endurance in your back. Endurance is the ability to use your muscles for a long time, even after they get tired. Pelvic tilt This exercise strengthens the muscles that lie deep in the abdomen. Lie on your back on a firm bed or the floor with your legs extended. Bend your knees so they are pointing toward the ceiling and your feet are flat on the floor. Tighten your lower abdominal muscles to press your lower back against the floor. This motion will tilt your pelvis so your tailbone points up toward the ceiling instead of pointing to your feet or the floor. To help with this exercise, you may place a small towel under your lower back and try to push your back into the towel. Hold this position for __________ seconds. Let your muscles relax completely before you repeat this exercise. Repeat __________ times. Complete this exercise __________ times a day. Alternating arm and leg raises  Get on your hands and knees on a firm surface. If you are on a hard floor, you may want to use padding, such as an exercise mat, to cushion your knees. Line up your arms and legs. Your hands should be directly below your shoulders, and your knees should be directly below your hips. Lift your left leg behind you. At the same time, raise your right arm and straighten it in front of you. Do not lift your leg higher than your hip. Do not lift your arm higher than your shoulder. Keep your abdominal and back muscles tight. Keep your hips facing the ground. Do not arch your back. Keep your balance carefully, and do not hold your breath. Hold this position for __________ seconds. Slowly return to the starting  position. Repeat with your right leg and your left arm. Repeat __________ times. Complete this exercise __________ times a day. Abdominal set with straight leg raise  Lie on your back on a firm bed or the floor. Bend one of your knees and keep your other leg straight. Tense your abdominal muscles and lift your straight leg up, 4-6 inches (10-15 cm) off the ground. Keep your abdominal muscles tight and hold this position for __________ seconds. Do not hold your breath. Do not arch your back. Keep it flat against the ground. Keep your abdominal muscles  tense as you slowly lower your leg back to the starting position. Repeat with your other leg. Repeat __________ times. Complete this exercise __________ times a day. Single leg lower with bent knees Lie on your back on a firm bed or the floor. Tense your abdominal muscles and lift your feet off the floor, one foot at a time, so your knees and hips are bent in 90-degree angles (right angles). Your knees should be over your hips and your lower legs should be parallel to the floor. Keeping your abdominal muscles tense and your knee bent, slowly lower one of your legs so your toe touches the ground. Lift your leg back up to return to the starting position. Do not hold your breath. Do not let your back arch. Keep your back flat against the ground. Repeat with your other leg. Repeat __________ times. Complete this exercise __________ times a day. Posture and body mechanics Good posture and healthy body mechanics can help to relieve stress in your body's tissues and joints. Body mechanics refers to the movements and positions of your body while you do your daily activities. Posture is part of body mechanics. Good posture means: Your spine is in its natural S-curve position (neutral). Your shoulders are pulled back slightly. Your head is not tipped forward (neutral). Follow these guidelines to improve your posture and body mechanics in your everyday  activities. Standing  When standing, keep your spine neutral and your feet about hip-width apart. Keep a slight bend in your knees. Your ears, shoulders, and hips should line up. When you do a task in which you stand in one place for a long time, place one foot up on a stable object that is 2-4 inches (5-10 cm) high, such as a footstool. This helps keep your spine neutral. Sitting  When sitting, keep your spine neutral and keep your feet flat on the floor. Use a footrest, if necessary, and keep your thighs parallel to the floor. Avoid rounding your shoulders, and avoid tilting your head forward. When working at a desk or a computer, keep your desk at a height where your hands are slightly lower than your elbows. Slide your chair under your desk so you are close enough to maintain good posture. When working at a computer, place your monitor at a height where you are looking straight ahead and you do not have to tilt your head forward or downward to look at the screen. Resting When lying down and resting, avoid positions that are most painful for you. If you have pain with activities such as sitting, bending, stooping, or squatting, lie in a position in which your body does not bend very much. For example, avoid curling up on your side with your arms and knees near your chest (fetal position). If you have pain with activities such as standing for a long time or reaching with your arms, lie with your spine in a neutral position and bend your knees slightly. Try the following positions: Lying on your side with a pillow between your knees. Lying on your back with a pillow under your knees. Lifting  When lifting objects, keep your feet at least shoulder-width apart and tighten your abdominal muscles. Bend your knees and hips and keep your spine neutral. It is important to lift using the strength of your legs, not your back. Do not lock your knees straight out. Always ask for help to lift heavy or  awkward objects. This information is not intended to replace advice given  to you by your health care provider. Make sure you discuss any questions you have with your health care provider. Document Revised: 02/22/2023 Document Reviewed: 01/06/2021 [employer] Patient Education  2024 [employer] Iliotibial Band Syndrome Rehab Ask your health care provider which exercises are safe for you. Do exercises exactly as told by your provider and adjust them as told. It's normal to feel mild stretching, pulling, tightness, or discomfort as you do these exercises. Stop right away if you feel sudden pain or your pain gets a lot worse. Do not begin these exercises until told by your provider. Stretching and range-of-motion exercises These exercises warm up your muscles and joints. They also improve the movement and flexibility of your hip and pelvis. Quadriceps stretch, prone  Lie face down (prone) on a firm surface like a bed or padded floor. Bend your left / right knee. Reach back to hold your ankle or pant leg. If you can't reach your ankle or pant leg, use a belt looped around your foot and grab the belt instead. Gently pull your heel toward your butt. Your knee should not slide out to the side. You should feel a stretch in the front of your thigh and knee, also called the quadriceps. Hold this position for __________ seconds. Repeat __________ times. Complete this exercise __________ times a day. Iliotibial band stretch The iliotibial band is a strip of tissue that runs along the outside of your hip down to your knee. Lie on your side with your left / right leg on top. Bend both knees and grab your left / right ankle. Stretch out your bottom arm to help you balance. Slowly bring your top knee back so your thigh goes behind your back. Slowly lower your top leg toward the floor until you feel a gentle stretch on the outside of your left / right hip and thigh. If you don't feel a stretch and your knee won't  go farther, place the heel of your other foot on top of your knee and pull your knee down toward the floor with your foot. Hold this position for __________ seconds. Repeat __________ times. Complete this exercise __________ times a day. Strengthening exercises These exercises build strength and endurance in your hip and pelvis. Endurance means your muscles can keep working even when they're tired. Straight leg raises, side-lying This exercise strengthens the muscles that rotate the leg at the hip and move it away from your body. These muscles are called hip abductors. Lie on your side with your left / right leg on top. Lie so your head, shoulder, hip, and knee line up. You can bend your bottom knee to help you balance. Roll your hips slightly forward so they're stacked directly over each other. Your left / right knee should face forward. Tense the muscles in your outer thigh and hip. Lift your top leg 4-6 inches (10-15 cm) off the ground. Hold this position for __________ seconds. Slowly lower your leg back down to the starting position. Let your muscles fully relax before doing this exercise again. Repeat __________ times. Complete this exercise __________ times a day. Leg raises, prone This exercise strengthens the muscles that move the hips backward. These muscles are called hip extensors. Lie face down (prone) on your bed or a firm surface. You can put a pillow under your hips for comfort and to support your lower back. Bend your left / right knee so your foot points straight up toward the ceiling. Keep the other leg straight and  behind you. Squeeze your butt muscles. Lift your left / right thigh off the firm surface. Do not let your back arch. Tense your thigh muscle as hard as you can without having more knee pain. Hold this position for __________ seconds. Slowly lower your leg to the starting position. Allow your leg to relax all the way. Repeat __________ times. Complete this exercise  __________ times a day. Hip hike  Stand sideways on a bottom step. Place your feet so that your left / right leg is on the step, and the other foot is hanging off the side. If you need support for balance, hold onto a railing or wall. Keep your knees straight and your abdomen square, meaning your hips are level. Then, lift your left / right hip up toward the ceiling. Slowly let your leg that's hanging off the step lower towards the floor. Your foot should get closer to the ground. Do not lean or bend your knees during this movement. Repeat __________ times. Complete this exercise __________ times a day. This information is not intended to replace advice given to you by your health care provider. Make sure you discuss any questions you have with your health care provider. Document Revised: 01/01/2023 Document Reviewed: 01/01/2023 [employer] Patient Education  2024 [employer].

## 2023-08-27 ENCOUNTER — Other Ambulatory Visit (HOSPITAL_BASED_OUTPATIENT_CLINIC_OR_DEPARTMENT_OTHER): Payer: Self-pay

## 2023-08-27 MED ORDER — INFLUENZA VIRUS VACC SPLIT PF (FLUZONE) 0.5 ML IM SUSY
0.5000 mL | PREFILLED_SYRINGE | Freq: Once | INTRAMUSCULAR | 0 refills | Status: AC
  Filled 2023-08-27: qty 0.5, 1d supply, fill #0

## 2023-08-29 ENCOUNTER — Encounter: Payer: Self-pay | Admitting: Cardiology

## 2023-08-29 DIAGNOSIS — I10 Essential (primary) hypertension: Secondary | ICD-10-CM

## 2023-08-30 MED ORDER — LOSARTAN POTASSIUM 100 MG PO TABS: 100.0000 mg | ORAL_TABLET | Freq: Every day | ORAL | 3 refills | Status: DC

## 2023-08-30 MED ORDER — LOSARTAN POTASSIUM 50 MG PO TABS: 50.0000 mg | ORAL_TABLET | Freq: Every day | ORAL | 3 refills | Status: DC

## 2023-08-30 NOTE — Addendum Note (Signed)
Addended by: Freddi Starr on: 08/30/2023 12:25 PM   Modules accepted: Orders

## 2023-09-01 DIAGNOSIS — I10 Essential (primary) hypertension: Secondary | ICD-10-CM | POA: Diagnosis not present

## 2023-09-02 ENCOUNTER — Encounter: Payer: Self-pay | Admitting: Cardiology

## 2023-09-02 LAB — BASIC METABOLIC PANEL
BUN/Creatinine Ratio: 20 (ref 9–23)
BUN: 14 mg/dL (ref 6–24)
CO2: 24 mmol/L (ref 20–29)
Calcium: 9.5 mg/dL (ref 8.7–10.2)
Chloride: 99 mmol/L (ref 96–106)
Creatinine, Ser: 0.7 mg/dL (ref 0.57–1.00)
Glucose: 88 mg/dL (ref 70–99)
Potassium: 4.4 mmol/L (ref 3.5–5.2)
Sodium: 139 mmol/L (ref 134–144)
eGFR: 100 mL/min/{1.73_m2} (ref >59)

## 2023-09-03 ENCOUNTER — Ambulatory Visit: Payer: 59 | Admitting: Family Medicine

## 2023-09-09 ENCOUNTER — Encounter: Payer: Self-pay | Admitting: Gastroenterology

## 2023-09-09 ENCOUNTER — Ambulatory Visit: Payer: Self-pay | Admitting: Gastroenterology

## 2023-09-09 VITALS — BP 130/84 | HR 88 | Ht 61.0 in | Wt 135.6 lb

## 2023-09-09 DIAGNOSIS — R198 Other specified symptoms and signs involving the digestive system and abdomen: Secondary | ICD-10-CM

## 2023-09-09 DIAGNOSIS — R140 Abdominal distension (gaseous): Secondary | ICD-10-CM | POA: Diagnosis not present

## 2023-09-09 DIAGNOSIS — K589 Irritable bowel syndrome without diarrhea: Secondary | ICD-10-CM | POA: Diagnosis not present

## 2023-09-09 NOTE — Progress Notes (Signed)
09/09/2023 Nicole Crosby [id number] 06/08/1966   HISTORY OF PRESENT ILLNESS: This is a 57 year old female who is a patient of Dr. Marvell Fuller.  She has a history of functional dyspepsia and IBS.  EGD and colonoscopy in April 2022 were unremarkable.  She is here today with complaints of generalized abdominal bloating and also rectal pressure.  Complains of a lot of gas that sometimes passes without warning and then sometimes she feels like she cannot get it to come out.  Feels like sometimes bowel movements are hard to clean.  Always feels uncomfortable in her abdomen.  Had a CT scan of the abdomen and pelvis with contrast in December 2023 that showed a small periumbilical abdominal wall hernia that contains only fat and a single small bowel loop but no evidence of obstruction or incarceration.  It seems that stools historically cycle through constipation and loose stool at times as well.  Past Medical History:  Diagnosis Date   Allergy    Buspirone, Sulfa, Pantoprazole   Benign fundic gland polyps of stomach 02/12/2016   Chicken pox    Dizziness    Eczema    Functional dyspepsia    GERD (gastroesophageal reflux disease)    H/O adenomatous polyp of colon 10/04/2014   09/2014 - 3 mm adenoma - repeat colonoscopy 2022   Hyperlipidemia    no meds   Hypertension    pt denies   IBS (irritable bowel syndrome)    Insomnia    MEDIAL EPICONDYLITIS, RIGHT 07/17/2010   Nasal turbinate hypertrophy 09/08/2022   Nonulcer dyspepsia 02/12/2016   Other abnormal glucose    Tiny hypodensity left hepatic lobe-CT of abdomen and pelvis 07/26/2008-small 8mm periumblical hernia   PAC (premature atrial contraction)    Palpitations    Tinnitus, bilateral 09/21/2018   Past Surgical History:  Procedure Laterality Date   CESAREAN SECTION  01/2001   COLONOSCOPY     DE QUERVAIN'S RELEASE  2000   Right wrist area   ESOPHAGOGASTRODUODENOSCOPY     EYE SURGERY  ?2018   Right eye to relieve pressure     reports that she has never smoked. She has been exposed to tobacco smoke. She has never used smokeless tobacco. She reports current alcohol use. She reports that she does not use drugs. family history includes Alcohol abuse in her sister; Arthritis in her sister; COPD in her maternal grandfather; Cancer in her father; Colon cancer in her maternal uncle; Dementia in her mother; Diabetes in her father, maternal grandmother, paternal grandmother, sister, and sister; Fibrocystic breast disease in her mother and sister; Hearing loss in her mother; Heart disease in her father; Hyperlipidemia in her father, mother, sister, sister, and sister; Hypertension in her father, mother, sister, sister, and sister; Kidney disease in her father; Neuropathy in her sister; Osteopenia in her mother; Other in her brother; Prostate cancer in her maternal grandfather; Prostate cancer (age of onset: 34) in her father; Stroke in her mother; Thyroid disease in her sister. Allergies  Allergen Reactions   Buspirone Other (See Comments)     nightmare   Esomeprazole Other (See Comments)    headache   Ezetimibe     Abdominal pain, diarrhea   Lansoprazole     Abdominal pain   Omeprazole     Headache   Proton Pump Inhibitors    Sulfonamide Derivatives Rash     Rash      Outpatient Encounter Medications as of 09/09/2023  Medication Sig   Calcium Carbonate-Vit  D-Min (CALCIUM 1200 PO) Take 1,200 Units by mouth daily.   cephALEXin (KEFLEX) 500 MG capsule Take 1 capsule (500 mg total) by mouth daily as needed post coital.   cholecalciferol (VITAMIN D) 1000 UNITS tablet Take 2,000 Units by mouth daily.   clindamycin (CLINDAGEL) 1 % gel Apply 1 Application topically as needed.   famotidine (PEPCID) 20 MG tablet TAKE 1 TABLET BY MOUTH TWICE DAILY   fluticasone (FLONASE) 50 MCG/ACT nasal spray Administer 2 sprays in each nostril daily.   meclizine (ANTIVERT) 25 MG tablet Take 25 mg by mouth 2 (two) times daily as needed for  dizziness.   methocarbamol (ROBAXIN) 500 MG tablet Take 500 mg by mouth every 6 (six) hours as needed.   metoprolol succinate (TOPROL XL) 25 MG 24 hr tablet Take 1/2 tablet (12.5 mg total) by mouth at bedtime.   Multiple Vitamin (MULTIVITAMIN ADULT PO) Take by mouth.   OSCIMIN 0.125 MG SUBL PLACE 1 TABLET (0.125 MG TOTAL) UNDER THE TONGUE EVERY 4 (FOUR) HOURS AS NEEDED.   RETIN-A 0.025 % cream    spironolactone (ALDACTONE) 25 MG tablet Take 1 tablet (25 mg total) by mouth daily.   triamcinolone cream (KENALOG) 0.1 % Apply 1 Application topically 2 (two) times daily.   vitamin B-12 (CYANOCOBALAMIN) 250 MCG tablet 500 mcg.   [DISCONTINUED] cephALEXin (KEFLEX) 500 MG capsule Take 1 capsule (500 mg total) by mouth 2 (two) times daily for 3 days as needed for UTI (Patient not taking: Reported on 08/26/2023)   No facility-administered encounter medications on file as of 09/09/2023.    REVIEW OF SYSTEMS  : All other systems reviewed and negative except where noted in the History of Present Illness.   PHYSICAL EXAM: BP 130/84 (BP Location: Left Arm, Patient Position: Sitting, Cuff Size: Normal)   Pulse 88   Ht 5\' 1"  (1.549 m)   Wt 135 lb 9 oz (61.5 kg)   LMP 11/15/2018   SpO2 98%   BMI 25.61 kg/m  General: Well developed white female in no acute distress Head: Normocephalic and atraumatic Eyes:  Sclerae anicteric, conjunctiva pink. Ears: Normal auditory acuity Lungs: Clear throughout to auscultation; no W/R/R. Heart: Regular rate and rhythm; no M/R/G. Abdomen: Soft, non-distended.  BS present.  Mild generalized TTP. Musculoskeletal: Symmetrical with no gross deformities  Skin: No lesions on visible extremities Extremities: No edema  Neurological: Alert oriented x 4, grossly non-focal Psychological:  Alert and cooperative. Normal mood and affect  ASSESSMENT AND PLAN: *57 year old female with history of IBS here with complaints of rectal pressure with gas and bloating.  It sounds like  she is possibly having some incomplete evacuation/incomplete emptying of stool.  Could also be having some gassy distention of the rectum.  Discussed trying Benefiber starting with 2 teaspoons mixed in 8 ounces of liquid daily to see if that would help bulk stool for more complete emptying.  We discussed FODMAP diet.  We also discussed possibly trying Fodzyme supplement/enzyme.  Will check a SIBO breath test.  CC:  Henson, Vickie L, NP-C

## 2023-09-09 NOTE — Patient Instructions (Signed)
Low FODmap information provided.   Start Benefiber 2 teaspoons in 8 ounces of liquid daily.  Will check on Fodezyme with Dr. Leone Payor.   You have been given a testing kit to check for small intestine bacterial overgrowth (SIBO) which is completed by a company named [facility]. Make sure to return your test in the mail using the return mailing label given to you along with the kit. The test order, your demographic and insurance information have all already been sent to the company. [facility] will collect an upfront charge of $99.74 for commercial insurance plans and $209.74 if you are paying cash. Make sure to discuss with [facility] PRIOR to having the test to see if they have gotten information from your insurance company as to how much your testing will cost out of pocket, if any. Please contact [facility] at phone number [phone] to get instructions regarding how to perform the test as our office is unable to give specific testing instructions.  _______________________________________________________  If your blood pressure at your visit was 140/90 or greater, please contact your primary care physician to follow up on this.  _______________________________________________________  If you are age 52 or older, your body mass index should be between 23-30. Your Body mass index is 25.61 kg/m. If this is out of the aforementioned range listed, please consider follow up with your Primary Care Provider.  If you are age 20 or younger, your body mass index should be between 19-25. Your Body mass index is 25.61 kg/m. If this is out of the aformentioned range listed, please consider follow up with your Primary Care Provider.   ________________________________________________________  The [facility] providers would like to encourage you to use [facility] to communicate with providers for non-urgent requests or questions.  Due to long hold times on the telephone, sending your  provider a message by [facility] may be a faster and more efficient way to get a response.  Please allow 48 business hours for a response.  Please remember that this is for non-urgent requests.  _______________________________________________________

## 2023-09-10 ENCOUNTER — Other Ambulatory Visit (HOSPITAL_BASED_OUTPATIENT_CLINIC_OR_DEPARTMENT_OTHER): Payer: Self-pay

## 2023-09-10 MED ORDER — AMLODIPINE BESYLATE 2.5 MG PO TABS
2.5000 mg | ORAL_TABLET | Freq: Every day | ORAL | 3 refills | Status: DC
  Filled 2023-09-10: qty 90, 90d supply, fill #0

## 2023-09-10 NOTE — Addendum Note (Signed)
Addended by: Freddi Starr on: 09/10/2023 02:27 PM   Modules accepted: Orders

## 2023-09-10 NOTE — Telephone Encounter (Signed)
Called patient and left message on personal voicemail that message will be sent to Dr. Jens Som for advice.

## 2023-09-21 ENCOUNTER — Ambulatory Visit: Payer: 59 | Admitting: Rheumatology

## 2023-09-22 ENCOUNTER — Ambulatory Visit: Payer: 59 | Admitting: Rheumatology

## 2023-09-22 DIAGNOSIS — R87618 Other abnormal cytological findings on specimens from cervix uteri: Secondary | ICD-10-CM | POA: Diagnosis not present

## 2023-10-18 ENCOUNTER — Other Ambulatory Visit (HOSPITAL_BASED_OUTPATIENT_CLINIC_OR_DEPARTMENT_OTHER): Payer: Self-pay

## 2023-10-18 MED ORDER — ESTRADIOL 0.1 MG/GM VA CREA
1.0000 g | TOPICAL_CREAM | Freq: Every evening | VAGINAL | 2 refills | Status: DC
  Filled 2023-10-18: qty 42.5, 90d supply, fill #0

## 2023-10-19 ENCOUNTER — Ambulatory Visit: Payer: 59 | Admitting: Gastroenterology

## 2023-10-25 ENCOUNTER — Encounter: Payer: Self-pay | Admitting: Cardiology

## 2023-10-25 DIAGNOSIS — I10 Essential (primary) hypertension: Secondary | ICD-10-CM

## 2023-10-26 ENCOUNTER — Other Ambulatory Visit (HOSPITAL_BASED_OUTPATIENT_CLINIC_OR_DEPARTMENT_OTHER): Payer: Self-pay

## 2023-10-26 MED ORDER — IRBESARTAN 150 MG PO TABS
150.0000 mg | ORAL_TABLET | Freq: Every day | ORAL | 3 refills | Status: DC
  Filled 2023-10-26: qty 90, 90d supply, fill #0
  Filled 2024-01-12: qty 90, 90d supply, fill #1
  Filled 2024-04-16: qty 90, 90d supply, fill #2
  Filled 2024-07-17: qty 90, 90d supply, fill #3

## 2023-10-26 NOTE — Addendum Note (Signed)
Addended by: Freddi Starr on: 10/26/2023 09:11 AM   Modules accepted: Orders

## 2023-11-02 DIAGNOSIS — D2272 Melanocytic nevi of left lower limb, including hip: Secondary | ICD-10-CM | POA: Diagnosis not present

## 2023-11-02 DIAGNOSIS — L821 Other seborrheic keratosis: Secondary | ICD-10-CM | POA: Diagnosis not present

## 2023-11-02 DIAGNOSIS — L814 Other melanin hyperpigmentation: Secondary | ICD-10-CM | POA: Diagnosis not present

## 2023-11-02 DIAGNOSIS — I10 Essential (primary) hypertension: Secondary | ICD-10-CM | POA: Diagnosis not present

## 2023-11-02 DIAGNOSIS — Z85828 Personal history of other malignant neoplasm of skin: Secondary | ICD-10-CM | POA: Diagnosis not present

## 2023-11-02 DIAGNOSIS — L573 Poikiloderma of Civatte: Secondary | ICD-10-CM | POA: Diagnosis not present

## 2023-11-02 DIAGNOSIS — D2271 Melanocytic nevi of right lower limb, including hip: Secondary | ICD-10-CM | POA: Diagnosis not present

## 2023-11-02 DIAGNOSIS — L578 Other skin changes due to chronic exposure to nonionizing radiation: Secondary | ICD-10-CM | POA: Diagnosis not present

## 2023-11-03 LAB — BASIC METABOLIC PANEL
BUN/Creatinine Ratio: 20 (ref 9–23)
BUN: 10 mg/dL (ref 6–24)
CO2: 26 mmol/L (ref 20–29)
Calcium: 9.9 mg/dL (ref 8.7–10.2)
Chloride: 100 mmol/L (ref 96–106)
Creatinine, Ser: 0.5 mg/dL — ABNORMAL LOW (ref 0.57–1.00)
Glucose: 133 mg/dL — ABNORMAL HIGH (ref 70–99)
Potassium: 3.9 mmol/L (ref 3.5–5.2)
Sodium: 142 mmol/L (ref 134–144)
eGFR: 108 mL/min/{1.73_m2} (ref >59)

## 2023-12-15 ENCOUNTER — Other Ambulatory Visit (HOSPITAL_BASED_OUTPATIENT_CLINIC_OR_DEPARTMENT_OTHER): Payer: Self-pay | Admitting: Family Medicine

## 2023-12-15 DIAGNOSIS — Z1231 Encounter for screening mammogram for malignant neoplasm of breast: Secondary | ICD-10-CM

## 2023-12-16 ENCOUNTER — Encounter (HOSPITAL_BASED_OUTPATIENT_CLINIC_OR_DEPARTMENT_OTHER): Payer: Self-pay

## 2023-12-16 ENCOUNTER — Ambulatory Visit (HOSPITAL_BASED_OUTPATIENT_CLINIC_OR_DEPARTMENT_OTHER)
Admission: RE | Admit: 2023-12-16 | Discharge: 2023-12-16 | Disposition: A | Discharge status: Home / Self Care | Payer: 59 | Source: Ambulatory Visit | Attending: Family Medicine | Admitting: Family Medicine

## 2023-12-16 DIAGNOSIS — Z1231 Encounter for screening mammogram for malignant neoplasm of breast: Secondary | ICD-10-CM | POA: Diagnosis not present

## 2024-01-12 ENCOUNTER — Other Ambulatory Visit (HOSPITAL_BASED_OUTPATIENT_CLINIC_OR_DEPARTMENT_OTHER): Payer: Self-pay

## 2024-02-29 ENCOUNTER — Ambulatory Visit: Payer: 59 | Admitting: Rheumatology

## 2024-03-17 ENCOUNTER — Encounter: Payer: Self-pay | Admitting: Family Medicine

## 2024-03-17 ENCOUNTER — Ambulatory Visit: Attending: Family Medicine

## 2024-03-17 ENCOUNTER — Ambulatory Visit: Admitting: Family Medicine

## 2024-03-17 VITALS — BP 120/78 | HR 75 | Temp 97.6°F | Ht 61.0 in | Wt 132.0 lb

## 2024-03-17 DIAGNOSIS — R519 Headache, unspecified: Secondary | ICD-10-CM | POA: Diagnosis not present

## 2024-03-17 DIAGNOSIS — I10 Essential (primary) hypertension: Secondary | ICD-10-CM

## 2024-03-17 DIAGNOSIS — R55 Syncope and collapse: Secondary | ICD-10-CM

## 2024-03-17 DIAGNOSIS — R7303 Prediabetes: Secondary | ICD-10-CM

## 2024-03-17 DIAGNOSIS — R002 Palpitations: Secondary | ICD-10-CM

## 2024-03-17 LAB — CBC WITH DIFFERENTIAL/PLATELET
Basophils Absolute: 0 10*3/uL (ref 0.0–0.1)
Basophils Relative: 0.8 % (ref 0.0–3.0)
Eosinophils Absolute: 0 10*3/uL (ref 0.0–0.7)
Eosinophils Relative: 0.9 % (ref 0.0–5.0)
HCT: 41.1 % (ref 36.0–46.0)
Hemoglobin: 13.7 g/dL (ref 12.0–15.0)
Lymphocytes Relative: 37.2 % (ref 12.0–46.0)
Lymphs Abs: 2 10*3/uL (ref 0.7–4.0)
MCHC: 33.4 g/dL (ref 30.0–36.0)
MCV: 91.1 fl (ref 78.0–100.0)
Monocytes Absolute: 0.4 10*3/uL (ref 0.1–1.0)
Monocytes Relative: 6.9 % (ref 3.0–12.0)
Neutro Abs: 3 10*3/uL (ref 1.4–7.7)
Neutrophils Relative %: 54.2 % (ref 43.0–77.0)
Platelets: 297 10*3/uL (ref 150.0–400.0)
RBC: 4.51 Mil/uL (ref 3.87–5.11)
RDW: 12.9 % (ref 11.5–15.5)
WBC: 5.5 10*3/uL (ref 4.0–10.5)

## 2024-03-17 LAB — COMPREHENSIVE METABOLIC PANEL WITH GFR
ALT: 13 U/L (ref 0–35)
AST: 17 U/L (ref 0–37)
Albumin: 4.4 g/dL (ref 3.5–5.2)
Alkaline Phosphatase: 78 U/L (ref 39–117)
BUN: 13 mg/dL (ref 6–23)
CO2: 30 meq/L (ref 19–32)
Calcium: 9 mg/dL (ref 8.4–10.5)
Chloride: 105 meq/L (ref 96–112)
Creatinine, Ser: 0.56 mg/dL (ref 0.40–1.20)
GFR: 99.33 mL/min (ref >60.00)
Glucose, Bld: 90 mg/dL (ref 70–99)
Potassium: 4.1 meq/L (ref 3.5–5.1)
Sodium: 140 meq/L (ref 135–145)
Total Bilirubin: 0.4 mg/dL (ref 0.2–1.2)
Total Protein: 7.3 g/dL (ref 6.0–8.3)

## 2024-03-17 LAB — MAGNESIUM: Magnesium: 2.1 mg/dL (ref 1.5–2.5)

## 2024-03-17 LAB — HEMOGLOBIN A1C: Hgb A1c MFr Bld: 5.8 % (ref 4.6–6.5)

## 2024-03-17 NOTE — Patient Instructions (Signed)
 Please go downstairs for labs.   Call and schedule with your cardiologist and eye doctors.   Keep a headache journal.   Try taking magnesium  OTC   You will receive the Zio cardiac monitor

## 2024-03-17 NOTE — Progress Notes (Signed)
 Subjective:     Patient ID: Nicole Crosby, female    DOB: 1966-07-31, 58 y.o.   MRN: [id number]  Chief Complaint  Patient presents with   Headache    Right side headaches 3-5x week in the afternoons. When she gets the headache she gets lightheaded.  Irregular heartbeat sometimes, mom had afib   Leg Pain    Left calf pain started this week. Radiated to back of knee    Headache  Associated symptoms include back pain and dizziness. Pertinent negatives include no abdominal pain, coughing, ear pain, fever, nausea, tinnitus or vomiting.  Leg Pain    History of Present Illness          C/o palpitations that feel like an irregular heart rhythm and not her usual "PACs". She reports a 2 hour episode yesterday. She feels like she is going to pass out with her palpitations.  Denies chest pain or shortness of breath. No N/V.   C/o headaches, on right side behind her right eye and extending to the side of her head above her right ear.  Headaches come on around lunch time and goes away in the evenings. Occurring  3-5 days per week.  She does not always take medicine.   Plans to see her eye doctor. Hx of increased pressures in her right eye.   She has invisalign   Left calf pain x 5 days with hx of same. No hx of DVT and no risk factors    Health Maintenance Due  Topic Date Due   HIV Screening  Never done   Hepatitis C Screening  Never done    Past Medical History:  Diagnosis Date   Allergy    Buspirone , Sulfa, Pantoprazole    Benign fundic gland polyps of stomach 02/12/2016   Chicken pox    Dizziness    Eczema    Functional dyspepsia    GERD (gastroesophageal reflux disease)    H/O adenomatous polyp of colon 10/04/2014   09/2014 - 3 mm adenoma - repeat colonoscopy 2022   Hyperlipidemia    no meds   Hypertension    pt denies   IBS (irritable bowel syndrome)    Insomnia    MEDIAL EPICONDYLITIS, RIGHT 07/17/2010   Nasal turbinate hypertrophy 09/08/2022   Nonulcer  dyspepsia 02/12/2016   Other abnormal glucose    Tiny hypodensity left hepatic lobe-CT of abdomen and pelvis 07/26/2008-small 8mm periumblical hernia   PAC (premature atrial contraction)    Palpitations    Tinnitus, bilateral 09/21/2018    Past Surgical History:  Procedure Laterality Date   CESAREAN SECTION  01/2001   COLONOSCOPY     DE QUERVAIN'S RELEASE  2000   Right wrist area   ESOPHAGOGASTRODUODENOSCOPY     EYE SURGERY  ?2018   Right eye to relieve pressure    Family History  Problem Relation Age of Onset   Hypertension Mother    Hyperlipidemia Mother    Fibrocystic breast disease Mother    Hearing loss Mother    Osteopenia Mother    Dementia Mother    Stroke Mother    Prostate cancer Father 58       deceased   Hypertension Father    Diabetes Father    Kidney disease Father    Heart disease Father        MI   Hyperlipidemia Father    Cancer Father    Thyroid  disease Sister    Diabetes Sister  pre-diabetes   Hypertension Sister    Arthritis Sister    Hyperlipidemia Sister    Alcohol abuse Sister    Diabetes Sister    Hypertension Sister    Hyperlipidemia Sister    Fibrocystic breast disease Sister    Neuropathy Sister    Hyperlipidemia Sister    Hypertension Sister    Other Brother        unknown   Colon cancer Maternal Uncle        died in late 31's   Diabetes Maternal Grandmother    Prostate cancer Maternal Grandfather    COPD Maternal Grandfather    Diabetes Paternal Grandmother    Rectal cancer Neg Hx    Stomach cancer Neg Hx    Esophageal cancer Neg Hx    Pancreatic cancer Neg Hx     Social History   Socioeconomic History   Marital status: Married    Spouse name: Not on file   Number of children: 2   Years of education: Not on file   Highest education level: Associate degree: occupational, Scientist, product/process development, or vocational program  Occupational History   Occupation: Xray/CT Theme park manager: [employer]  Tobacco Use   Smoking status:  Never    Passive exposure: Past   Smokeless tobacco: Never  Vaping Use   Vaping status: Never Used  Substance and Sexual Activity   Alcohol use: Yes    Comment: Occasional   Drug use: No   Sexual activity: Yes    Birth control/protection: Post-menopausal  Other Topics Concern   Not on file  Social History Narrative   Occupation: Engineer, 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    Alcohol Use - yes-wine         Full Time    Married           Social Drivers of Health   Financial Resource Strain: Patient Declined (03/13/2024)   Overall Financial Resource Strain (CARDIA)    Difficulty of Paying Living Expenses: Patient declined  Food Insecurity: Patient Declined (03/13/2024)   Hunger Vital Sign    Worried About Running Out of Food in the Last Year: Patient declined    Ran Out of Food in the Last Year: Patient declined  Transportation Needs: Patient Declined (03/13/2024)   PRAPARE - Administrator, Civil Service (Medical): Patient declined    Lack of Transportation (Non-Medical): Patient declined  Physical Activity: Unknown (03/13/2024)   Exercise Vital Sign    Days of Exercise per Week: Patient declined    Minutes of Exercise per Session: 150+ min  Stress: Patient Declined (03/13/2024)   [employer] of Occupational Health - Occupational Stress Questionnaire    Feeling of Stress : Patient declined  Social Connections: Unknown (03/13/2024)   Social Connection and Isolation Panel [NHANES]    Frequency of Communication with Friends and Family: Patient declined    Frequency of Social Gatherings with Friends and Family: Patient declined    Attends Religious Services: Patient declined    Database administrator or Organizations: Patient declined    Attends Engineer, structural: Not on file    Marital Status: Patient declined  Intimate Partner Violence: Not on file    Outpatient Medications Prior to Visit  Medication Sig Dispense Refill   Calcium   Carbonate-Vit D-Min (CALCIUM  1200 PO) Take 1,200 Units by mouth daily.     cephALEXin  (KEFLEX ) 500 MG capsule Take 1 capsule (500 mg total) by mouth  daily as needed post coital. 60 capsule 1   cholecalciferol (VITAMIN D ) 1000 UNITS tablet Take 2,000 Units by mouth daily.     clindamycin  (CLINDAGEL ) 1 % gel Apply 1 Application topically as needed.     fluticasone  (FLONASE ) 50 MCG/ACT nasal spray Administer 2 sprays in each nostril daily. 16 g 5   irbesartan  (AVAPRO ) 150 MG tablet Take 1 tablet (150 mg total) by mouth daily. 90 tablet 3   meclizine (ANTIVERT) 25 MG tablet Take 25 mg by mouth 2 (two) times daily as needed for dizziness.     methocarbamol (ROBAXIN) 500 MG tablet Take 500 mg by mouth every 6 (six) hours as needed.     metoprolol  succinate (TOPROL  XL) 25 MG 24 hr tablet Take 1/2 tablet (12.5 mg total) by mouth at bedtime. 45 tablet 3   Multiple Vitamin (MULTIVITAMIN ADULT PO) Take by mouth.     OSCIMIN  0.125 MG SUBL PLACE 1 TABLET (0.125 MG TOTAL) UNDER THE TONGUE EVERY 4 (FOUR) HOURS AS NEEDED. 60 tablet 0   RETIN-A 0.025 % cream      triamcinolone  cream (KENALOG ) 0.1 % Apply 1 Application topically 2 (two) times daily. 30 g 1   vitamin B-12 (CYANOCOBALAMIN ) 250 MCG tablet 500 mcg.     estradiol  (ESTRACE  VAGINAL) 0.1 MG/GM vaginal cream Place 1 g vaginally at bedtime. for 2 weeks, then place 1 g vaginally twice weekly. 42.5 g 2   famotidine  (PEPCID ) 20 MG tablet TAKE 1 TABLET BY MOUTH TWICE DAILY 60 tablet 5   No facility-administered medications prior to visit.    Allergies  Allergen Reactions   Buspirone  Other (See Comments)     nightmare   Esomeprazole  Other (See Comments)    headache   Ezetimibe      Abdominal pain, diarrhea   Lansoprazole      Abdominal pain   Omeprazole      Headache   Proton Pump Inhibitors    Sulfonamide Derivatives Rash     Rash    Review of Systems  Constitutional:  Negative for chills and fever.  HENT:  Negative for congestion, ear pain and  tinnitus.   Eyes:  Negative for double vision.  Respiratory:  Negative for cough and shortness of breath.   Cardiovascular:  Positive for chest pain and palpitations. Negative for leg swelling.  Gastrointestinal:  Negative for abdominal pain, constipation, diarrhea, nausea and vomiting.  Genitourinary:  Negative for dysuria, frequency and urgency.  Musculoskeletal:  Positive for back pain. Negative for falls.  Neurological:  Positive for dizziness and headaches. Negative for speech change and focal weakness.       Objective:     Physical Exam Constitutional:      General: She is not in acute distress.    Appearance: She is not ill-appearing.  HENT:     Head: Atraumatic.     Comments: No TTP of temporal region. Temporal pulse normal     Right Ear: Tympanic membrane and ear canal normal. No mastoid tenderness.     Left Ear: Tympanic membrane and ear canal normal. No mastoid tenderness.     Mouth/Throat:     Mouth: Mucous membranes are moist.  Eyes:     General: No visual field deficit.    Extraocular Movements: Extraocular movements intact.     Conjunctiva/sclera: Conjunctivae normal.     Pupils: Pupils are equal, round, and reactive to light.  Cardiovascular:     Rate and Rhythm: Normal rate and regular rhythm.  Heart sounds: Normal heart sounds.  Pulmonary:     Effort: Pulmonary effort is normal.     Breath sounds: Normal breath sounds.  Musculoskeletal:        General: No swelling. Normal range of motion.     Cervical back: Normal range of motion and neck supple.     Right lower leg: No edema.     Left lower leg: No edema.     Comments: Negative Homans. Left calf without erythema, increased warmth or swelling   Skin:    General: Skin is warm and dry.  Neurological:     General: No focal deficit present.     Mental Status: She is alert and oriented to person, place, and time.     Cranial Nerves: No cranial nerve deficit or facial asymmetry.     Motor: No weakness.      Coordination: Romberg sign negative. Coordination normal.     Gait: Gait normal.  Psychiatric:        Mood and Affect: Mood normal.        Speech: Speech normal.        Behavior: Behavior normal.        Thought Content: Thought content normal.      BP 120/78 (BP Location: Left Arm, Patient Position: Sitting)   Pulse 75   Temp 97.6 F (36.4 C) (Temporal)   Ht 5\' 1"  (1.549 m)   Wt 132 lb (59.9 kg)   LMP 11/15/2018   SpO2 99%   BMI 24.94 kg/m  Wt Readings from Last 3 Encounters:  03/17/24 132 lb (59.9 kg)  09/09/23 135 lb 9 oz (61.5 kg)  08/26/23 137 lb (62.1 kg)       Assessment & Plan:   Problem List Items Addressed This Visit     Essential hypertension, benign   Relevant Orders   CBC with Differential/Platelet   Comprehensive metabolic panel with GFR   Near syncope   Relevant Orders   CBC with Differential/Platelet   Comprehensive metabolic panel with GFR   TSH   T4, free   LONG TERM MONITOR (3-14 DAYS)   Palpitations - Primary   Relevant Orders   EKG 12-Lead (Completed)   CBC with Differential/Platelet   Comprehensive metabolic panel with GFR   TSH   T4, free   Magnesium    LONG TERM MONITOR (3-14 DAYS)   Prediabetes   Relevant Orders   Hemoglobin A1c   Other Visit Diagnoses       Intermittent headache       Relevant Orders   Magnesium       Intermittent palpitations with near syncope. EKG shows NSR, rate 72, no Q waves or acute ST- T wave changes  Event monitor ordered.  Check labs.  She will follow up with her cardiologist. If any new or worsening symptoms, she will get evaluated in the ED.   Headache is intermittent. Advised to stay hydrated, avoid skipping meals, fluctuations in caffeine and sleep. Avoid taking NSAIDs more than 2 days per week. Start magnesium  supplement.  Keep a headache journal. See eye doctor.  Normal brain MRI in 05/2023  Calf pain with hx of same not concerning for DVT. Most likely MSK  Follow up pending lab  results.    I have discontinued Saily B. Profit's estradiol . I am also having her maintain her cholecalciferol, meclizine, Calcium  Carbonate-Vit D-Min (CALCIUM  1200 PO), Oscimin , methocarbamol, fluticasone , clindamycin , metoprolol  succinate, Multiple Vitamin (MULTIVITAMIN ADULT PO), famotidine , triamcinolone  cream, vitamin B-12, Retin-A,  cephALEXin , and irbesartan .  No orders of the defined types were placed in this encounter.

## 2024-03-17 NOTE — Progress Notes (Unsigned)
 EP to read.

## 2024-03-20 ENCOUNTER — Encounter: Payer: Self-pay | Admitting: Family Medicine

## 2024-03-21 LAB — T4, FREE: Free T4: 1.2 ng/dL (ref 0.60–1.60)

## 2024-03-21 LAB — TSH: TSH: 0.58 u[IU]/mL (ref 0.35–5.50)

## 2024-03-30 DIAGNOSIS — R002 Palpitations: Secondary | ICD-10-CM | POA: Diagnosis not present

## 2024-03-30 DIAGNOSIS — R55 Syncope and collapse: Secondary | ICD-10-CM | POA: Diagnosis not present

## 2024-04-02 DIAGNOSIS — R55 Syncope and collapse: Secondary | ICD-10-CM | POA: Diagnosis not present

## 2024-04-02 DIAGNOSIS — R002 Palpitations: Secondary | ICD-10-CM | POA: Diagnosis not present

## 2024-04-16 ENCOUNTER — Ambulatory Visit: Payer: Self-pay | Admitting: Family Medicine

## 2024-04-18 ENCOUNTER — Encounter: Payer: Self-pay | Admitting: *Deleted

## 2024-04-25 NOTE — Progress Notes (Signed)
 HPI: FU coronary calcification and palpitations. Echocardiogram in August of 2010 revealed normal LV function and no valvular abnormalities. Stress echocardiogram December 2010 normal. CardioNet 2010 showed sinus to sinus tachycardia with rare PAC. Stress echo 6/15 normal. ETT 11/19 negative adequate.  Cardiac CTA February 2022 showed minimal nonobstructive coronary disease and calcium  score of 0.7.  Monitor December 2022 showed sinus rhythm with occasional PAC, brief PAT and rare PVC.  Monitor May 2025 showed sinus rhythm with 2 short runs of SVT (longest 7 beats) and rare PACs and PVCs.  Since last seen she has dyspnea with more vigorous activities but not routine activities.  No orthopnea, PND, pedal edema, chest pain or syncope.  She continues to have palpitations described as brief flutters.  Noted metoprolol  previously caused fatigue.  Current Outpatient Medications  Medication Sig Dispense Refill   cephALEXin  (KEFLEX ) 500 MG capsule Take 1 capsule (500 mg total) by mouth daily as needed post coital. 60 capsule 1   cholecalciferol (VITAMIN D ) 1000 UNITS tablet Take 2,000 Units by mouth daily.     clindamycin  (CLINDAGEL ) 1 % gel Apply 1 Application topically as needed.     famotidine  (PEPCID ) 20 MG tablet TAKE 1 TABLET BY MOUTH TWICE DAILY 60 tablet 5   fluticasone  (FLONASE ) 50 MCG/ACT nasal spray Administer 2 sprays in each nostril daily. 16 g 5   irbesartan  (AVAPRO ) 150 MG tablet Take 1 tablet (150 mg total) by mouth daily. 90 tablet 3   meclizine (ANTIVERT) 25 MG tablet Take 25 mg by mouth 2 (two) times daily as needed for dizziness.     methocarbamol (ROBAXIN) 500 MG tablet Take 500 mg by mouth every 6 (six) hours as needed.     Multiple Vitamin (MULTIVITAMIN ADULT PO) Take by mouth.     OSCIMIN  0.125 MG SUBL PLACE 1 TABLET (0.125 MG TOTAL) UNDER THE TONGUE EVERY 4 (FOUR) HOURS AS NEEDED. 60 tablet 0   RETIN-A 0.025 % cream      triamcinolone  cream (KENALOG ) 0.1 % Apply 1 Application  topically 2 (two) times daily. 30 g 1   vitamin B-12 (CYANOCOBALAMIN ) 250 MCG tablet 500 mcg.     No current facility-administered medications for this visit.     Past Medical History:  Diagnosis Date   Allergy    Buspirone , Sulfa, Pantoprazole    Benign fundic gland polyps of stomach 02/12/2016   Chicken pox    Dizziness    Eczema    Functional dyspepsia    GERD (gastroesophageal reflux disease)    H/O adenomatous polyp of colon 10/04/2014   09/2014 - 3 mm adenoma - repeat colonoscopy 2022   Hyperlipidemia    no meds   Hypertension    pt denies   IBS (irritable bowel syndrome)    Insomnia    MEDIAL EPICONDYLITIS, RIGHT 07/17/2010   Nasal turbinate hypertrophy 09/08/2022   Nonulcer dyspepsia 02/12/2016   Other abnormal glucose    Tiny hypodensity left hepatic lobe-CT of abdomen and pelvis 07/26/2008-small 8mm periumblical hernia   PAC (premature atrial contraction)    Palpitations    Tinnitus, bilateral 09/21/2018    Past Surgical History:  Procedure Laterality Date   CESAREAN SECTION  01/2001   COLONOSCOPY     DE QUERVAIN'S RELEASE  2000   Right wrist area   ESOPHAGOGASTRODUODENOSCOPY     EYE SURGERY  ?2018   Right eye to relieve pressure    Social History   Socioeconomic History   Marital status: Married  Spouse name: Not on file   Number of children: 2   Years of education: Not on file   Highest education level: Associate degree: occupational, Scientist, product/process development, or vocational program  Occupational History   Occupation: Xray/CT Theme park manager: [employer]  Tobacco Use   Smoking status: Never    Passive exposure: Past   Smokeless tobacco: Never  Vaping Use   Vaping status: Never Used  Substance and Sexual Activity   Alcohol use: Yes    Comment: Occasional   Drug use: No   Sexual activity: Yes    Birth control/protection: Post-menopausal  Other Topics Concern   Not on file  Social History Narrative   Occupation: Engineer, 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    Alcohol Use - yes-wine         Full Time    Married           Social Drivers of Health   Financial Resource Strain: Patient Declined (03/13/2024)   Overall Financial Resource Strain (CARDIA)    Difficulty of Paying Living Expenses: Patient declined  Food Insecurity: Patient Declined (03/13/2024)   Hunger Vital Sign    Worried About Running Out of Food in the Last Year: Patient declined    Ran Out of Food in the Last Year: Patient declined  Transportation Needs: Patient Declined (03/13/2024)   PRAPARE - Administrator, Civil Service (Medical): Patient declined    Lack of Transportation (Non-Medical): Patient declined  Physical Activity: Unknown (03/13/2024)   Exercise Vital Sign    Days of Exercise per Week: Patient declined    Minutes of Exercise per Session: 150+ min  Stress: Patient Declined (03/13/2024)   [employer] of Occupational Health - Occupational Stress Questionnaire    Feeling of Stress : Patient declined  Social Connections: Unknown (03/13/2024)   Social Connection and Isolation Panel    Frequency of Communication with Friends and Family: Patient declined    Frequency of Social Gatherings with Friends and Family: Patient declined    Attends Religious Services: Patient declined    Database administrator or Organizations: Patient declined    Attends Engineer, structural: Not on file    Marital Status: Patient declined  Catering manager Violence: Not on file    Family History  Problem Relation Age of Onset   Hypertension Mother    Hyperlipidemia Mother    Fibrocystic breast disease Mother    Hearing loss Mother    Osteopenia Mother    Dementia Mother    Stroke Mother    Prostate cancer Father 36       deceased   Hypertension Father    Diabetes Father    Kidney disease Father    Heart disease Father        MI   Hyperlipidemia Father    Cancer Father    Thyroid  disease Sister    Diabetes Sister        pre-diabetes    Hypertension Sister    Arthritis Sister    Hyperlipidemia Sister    Alcohol abuse Sister    Diabetes Sister    Hypertension Sister    Hyperlipidemia Sister    Fibrocystic breast disease Sister    Neuropathy Sister    Hyperlipidemia Sister    Hypertension Sister    Other Brother        unknown   Colon cancer Maternal Uncle        died  in late 70's   Diabetes Maternal Grandmother    Prostate cancer Maternal Grandfather    COPD Maternal Grandfather    Diabetes Paternal Grandmother    Rectal cancer Neg Hx    Stomach cancer Neg Hx    Esophageal cancer Neg Hx    Pancreatic cancer Neg Hx     ROS: no fevers or chills, productive cough, hemoptysis, dysphasia, odynophagia, melena, hematochezia, dysuria, hematuria, rash, seizure activity, orthopnea, PND, pedal edema, claudication. Remaining systems are negative.  Physical Exam: Well-developed well-nourished in no acute distress.  Skin is warm and dry.  HEENT is normal.  Neck is supple.  Chest is clear to auscultation with normal expansion.  Cardiovascular exam is regular rate and rhythm.  Abdominal exam nontender or distended. No masses palpated. Extremities show no edema. neuro grossly intact   A/P  1 palpitations-recent monitor showed PACs, PVCs and short runs of SVT (longest 7 beats).  Toprol  caused fatigue and she therefore discontinued.  Will try bisoprolol 2.5 mg nightly to see if she tolerates.  2 coronary calcification-previously did not tolerate Crestor  or Zetia .  3 hypertension-blood pressure borderline.  We are adding bisoprolol as outlined above.  Follow and adjust as needed.  4 hyperlipidemia-as outlined above she did not tolerate Crestor  or Zetia .  Redell Shallow, MD

## 2024-05-01 ENCOUNTER — Ambulatory Visit (INDEPENDENT_AMBULATORY_CARE_PROVIDER_SITE_OTHER): Admitting: Cardiology

## 2024-05-01 ENCOUNTER — Encounter: Payer: Self-pay | Admitting: Cardiology

## 2024-05-01 ENCOUNTER — Other Ambulatory Visit: Payer: Self-pay

## 2024-05-01 ENCOUNTER — Other Ambulatory Visit (HOSPITAL_BASED_OUTPATIENT_CLINIC_OR_DEPARTMENT_OTHER): Payer: Self-pay

## 2024-05-01 VITALS — BP 131/77 | HR 104 | Ht 61.0 in | Wt 132.0 lb

## 2024-05-01 DIAGNOSIS — I10 Essential (primary) hypertension: Secondary | ICD-10-CM | POA: Diagnosis not present

## 2024-05-01 DIAGNOSIS — R002 Palpitations: Secondary | ICD-10-CM

## 2024-05-01 DIAGNOSIS — R931 Abnormal findings on diagnostic imaging of heart and coronary circulation: Secondary | ICD-10-CM

## 2024-05-01 DIAGNOSIS — E785 Hyperlipidemia, unspecified: Secondary | ICD-10-CM

## 2024-05-01 MED ORDER — BISOPROLOL FUMARATE 5 MG PO TABS
2.5000 mg | ORAL_TABLET | Freq: Every day | ORAL | 3 refills | Status: DC
  Filled 2024-05-01: qty 45, 90d supply, fill #0

## 2024-05-01 NOTE — Patient Instructions (Signed)
 Medication Instructions:   START BISOPROLOL 2.5 MG ONCE DAILY AT BEDTIME= 1/2 OF THE 5 MG TABLET ONCE DAILY  *If you need a refill on your cardiac medications before your next appointment, please call your pharmacy*   Follow-Up: At [facility], you and your health needs are our priority.  As part of our continuing mission to provide you with exceptional heart care, our providers are all part of one team.  This team includes your primary Cardiologist (physician) and Advanced Practice Providers or APPs (Physician Assistants and Nurse Practitioners) who all work together to provide you with the care you need, when you need it.  Your next appointment:   6 month(s)  Provider:   Redell Shallow, MD

## 2024-05-26 DIAGNOSIS — H5203 Hypermetropia, bilateral: Secondary | ICD-10-CM | POA: Diagnosis not present

## 2024-05-26 DIAGNOSIS — H524 Presbyopia: Secondary | ICD-10-CM | POA: Diagnosis not present

## 2024-06-16 ENCOUNTER — Encounter: Payer: Self-pay | Admitting: Family Medicine

## 2024-06-16 ENCOUNTER — Ambulatory Visit: Payer: Self-pay | Admitting: Family Medicine

## 2024-06-16 ENCOUNTER — Ambulatory Visit: Admitting: Family Medicine

## 2024-06-16 VITALS — BP 124/76 | HR 68 | Temp 97.6°F | Ht 61.0 in | Wt 128.0 lb

## 2024-06-16 DIAGNOSIS — R42 Dizziness and giddiness: Secondary | ICD-10-CM | POA: Diagnosis not present

## 2024-06-16 DIAGNOSIS — K219 Gastro-esophageal reflux disease without esophagitis: Secondary | ICD-10-CM | POA: Diagnosis not present

## 2024-06-16 DIAGNOSIS — K5900 Constipation, unspecified: Secondary | ICD-10-CM

## 2024-06-16 DIAGNOSIS — R110 Nausea: Secondary | ICD-10-CM

## 2024-06-16 DIAGNOSIS — R413 Other amnesia: Secondary | ICD-10-CM | POA: Diagnosis not present

## 2024-06-16 DIAGNOSIS — G44219 Episodic tension-type headache, not intractable: Secondary | ICD-10-CM

## 2024-06-16 LAB — CBC
HCT: 43 % (ref 36.0–46.0)
Hemoglobin: 14.1 g/dL (ref 12.0–15.0)
MCHC: 32.7 g/dL (ref 30.0–36.0)
MCV: 91.8 fl (ref 78.0–100.0)
Platelets: 277 K/uL (ref 150.0–400.0)
RBC: 4.68 Mil/uL (ref 3.87–5.11)
RDW: 12.6 % (ref 11.5–15.5)
WBC: 5.6 K/uL (ref 4.0–10.5)

## 2024-06-16 LAB — COMPREHENSIVE METABOLIC PANEL WITH GFR
ALT: 15 U/L (ref 0–35)
AST: 20 U/L (ref 0–37)
Albumin: 4.4 g/dL (ref 3.5–5.2)
Alkaline Phosphatase: 74 U/L (ref 39–117)
BUN: 12 mg/dL (ref 6–23)
CO2: 29 meq/L (ref 19–32)
Calcium: 8.9 mg/dL (ref 8.4–10.5)
Chloride: 101 meq/L (ref 96–112)
Creatinine, Ser: 0.55 mg/dL (ref 0.40–1.20)
GFR: 99.59 mL/min (ref >60.00)
Glucose, Bld: 89 mg/dL (ref 70–99)
Potassium: 4.1 meq/L (ref 3.5–5.1)
Sodium: 136 meq/L (ref 135–145)
Total Bilirubin: 0.5 mg/dL (ref 0.2–1.2)
Total Protein: 7.6 g/dL (ref 6.0–8.3)

## 2024-06-16 LAB — C-REACTIVE PROTEIN: CRP: <1 mg/dL (ref 0.5–20.0)

## 2024-06-16 LAB — SEDIMENTATION RATE: Sed Rate: 17 mm/h (ref 0–30)

## 2024-06-16 LAB — VITAMIN B12: Vitamin B-12: >1500 pg/mL — ABNORMAL HIGH (ref 211–911)

## 2024-06-16 LAB — TSH: TSH: 0.67 u[IU]/mL (ref 0.35–5.50)

## 2024-06-16 NOTE — Progress Notes (Signed)
 Subjective:     Patient ID: Nicole Crosby, female    DOB: 06-10-1966, 58 y.o.   MRN: [id number]  Chief Complaint  Patient presents with   Headache    Still having pain over right eye, told to see eye dr and he told her its not an eye dr problem. Pain in back of her head. Dizziness and forehead swelling. Face isnt symetrical nausea and lost 6 lbs this year    Headache     Discussed the use of AI scribe software for clinical note transcription with the patient, who gave verbal consent to proceed.  History of Present Illness Nicole Crosby is a 58 year old female who presents with concerns regarding headaches.  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 - Swelling present on the forehead - Right eyelid ptosis, more pronounced than usual - Difficulty with reading, word processing, and distinguishing left from right - Dizziness and lightheadedness, including when lying down  Gastrointestinal symptoms and weight loss - Constipation and abdominal pain since December - CT scan of the abdomen was normal - Colonoscopy is up to date - Nausea has improved but persists, limiting ability to eat full meals - Six-pound weight loss over the past two to three months - Takes medication for acid reflux; throat burning has resolved despite ongoing nausea  Cardiopulmonary and neuromuscular symptoms - Chest pressure and tingling in the left hand - History of supraventricular tachycardia (SVT) - Previous heart monitor showed only SVT, no other significant findings - No vomiting, chest pain, or syncope - Tingling in the left hand without weakness or stroke-like symptoms      CLINICAL DATA:  Headache, increasing frequency or  severity headaches; Neck pain, chronic neck pain   EXAM: MRI HEAD WITHOUT AND WITH CONTRAST   MRI CERVICAL SPINE WITHOUT CONTRAST   CONTRAST:  6mL GADAVIST  GADOBUTROL  1 MMOL/ML IV SOLN   TECHNIQUE: Multiplanar, multiecho pulse sequences of the brain and surrounding structures without and with contrast. Multiplanar, multiecho pulse sequences of the cervical and thoracic spine were obtained without intravenous contrast.   COMPARISON:  6 mL of Gadobutrol    FINDINGS: MRI HEAD FINDINGS   Brain: No acute infarction, hemorrhage, hydrocephalus, extra-axial collection or mass lesion. No pathologic enhancement. Mild scattered T2/FLAIR hyperintense white matter, nonspecific but considered within normal limits for patient age.   Vascular: Major arterial flow voids are maintained.   Skull and upper cervical spine: Normal marrow signal.   Sinuses/Orbits: Clear sinuses.  No acute orbital findings.   Other: No mastoid effusions.   MRI CERVICAL SPINE FINDINGS   Alignment: No substantial sagittal subluxation.   Vertebrae: No fracture, evidence of discitis, or bone lesion.   Cord: Normal cord signal.   Posterior Fossa, vertebral arteries, paraspinal tissues: Visualized vertebral artery flow voids are maintained.   Disc levels:   C2-C3: No significant disc protrusion, foraminal stenosis, or canal stenosis.   C3-C4: No significant disc protrusion, foraminal stenosis, or canal stenosis.   C4-C5: Mild right uncovertebral hypertrophy. No significant stenosis.   C5-C6: Small posterior disc osteophyte complex mild uncovertebral hypertrophy. No significant stenosis.   C6-C7: Small posterior disc osteophyte complex and right greater than left uncovertebral hypertrophy. Mild right foraminal stenosis. Mild canal stenosis.  C7-T1: No significant disc protrusion, foraminal stenosis, or canal stenosis.   IMPRESSION: MRI head:   Normal brain MRI.  No acute abnormality.   MRI  cervical spine:   Mild multilevel degenerative change, greatest at C6-C7 where there is mild right foraminal stenosis and mild canal stenosis.     Electronically Signed   By: Gilmore GORMAN Molt M.D.   On: 05/10/2023 16:51     Health Maintenance Due  Topic Date Due   HIV Screening  Never done   Hepatitis C Screening  Never done   Pneumococcal Vaccine: 50+ Years (1 of 1 - PCV) Never done   INFLUENZA VACCINE  06/02/2024    Past Medical History:  Diagnosis Date   Allergy    Buspirone , Sulfa, Pantoprazole    Benign fundic gland polyps of stomach 02/12/2016   Chicken pox    Dizziness    Eczema    Functional dyspepsia    GERD (gastroesophageal reflux disease)    H/O adenomatous polyp of colon 10/04/2014   09/2014 - 3 mm adenoma - repeat colonoscopy 2022   Hyperlipidemia    no meds   Hypertension    pt denies   IBS (irritable bowel syndrome)    Insomnia    MEDIAL EPICONDYLITIS, RIGHT 07/17/2010   Nasal turbinate hypertrophy 09/08/2022   Nonulcer dyspepsia 02/12/2016   Other abnormal glucose    Tiny hypodensity left hepatic lobe-CT of abdomen and pelvis 07/26/2008-small 8mm periumblical hernia   PAC (premature atrial contraction)    Pain in right elbow 08/31/2018   Palpitations    Tinnitus, bilateral 09/21/2018    Past Surgical History:  Procedure Laterality Date   CESAREAN SECTION  01/2001   COLONOSCOPY     DE QUERVAIN'S RELEASE  2000   Right wrist area   ESOPHAGOGASTRODUODENOSCOPY     EYE SURGERY  ?2018   Right eye to relieve pressure    Family History  Problem Relation Age of Onset   Hypertension Mother    Hyperlipidemia Mother    Fibrocystic breast disease Mother    Hearing loss Mother    Osteopenia Mother    Dementia Mother    Stroke Mother    Prostate cancer Father 80       deceased   Hypertension Father    Diabetes Father    Kidney disease Father    Heart disease Father        MI   Hyperlipidemia Father    Cancer Father    Thyroid  disease  Sister    Diabetes Sister        pre-diabetes   Hypertension Sister    Arthritis Sister    Hyperlipidemia Sister    Alcohol abuse Sister    Diabetes Sister    Hypertension Sister    Hyperlipidemia Sister    Fibrocystic breast disease Sister    Neuropathy Sister    Hyperlipidemia Sister    Hypertension Sister    Other Brother        unknown   Colon cancer Maternal Uncle        died in late 86's   Diabetes Maternal Grandmother    Prostate cancer Maternal Grandfather    COPD Maternal Grandfather    Diabetes Paternal Grandmother    Rectal cancer Neg Hx    Stomach cancer Neg Hx    Esophageal cancer Neg Hx    Pancreatic cancer Neg Hx     Social History   Socioeconomic History   Marital status: Married  Spouse name: Not on file   Number of children: 2   Years of education: Not on file   Highest education level: Associate degree: occupational, Scientist, product/process development, or vocational program  Occupational History   Occupation: Xray/CT Theme park manager: [employer]  Tobacco Use   Smoking status: Never    Passive exposure: Past   Smokeless tobacco: Never  Vaping Use   Vaping status: Never Used  Substance and Sexual Activity   Alcohol use: Yes    Comment: Occasional   Drug use: No   Sexual activity: Yes    Birth control/protection: Post-menopausal  Other Topics Concern   Not on file  Social History Narrative   Occupation: Engineer, 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    Alcohol Use - yes-wine         Full Time    Married           Social Drivers of Health   Financial Resource Strain: Patient Declined (06/12/2024)   Overall Financial Resource Strain (CARDIA)    Difficulty of Paying Living Expenses: Patient declined  Food Insecurity: Patient Declined (06/12/2024)   Hunger Vital Sign    Worried About Running Out of Food in the Last Year: Patient declined    Ran Out of Food in the Last Year: Patient declined  Transportation Needs: Patient Declined (06/12/2024)   PRAPARE -  Administrator, Civil Service (Medical): Patient declined    Lack of Transportation (Non-Medical): Patient declined  Physical Activity: Unknown (06/12/2024)   Exercise Vital Sign    Days of Exercise per Week: Patient declined    Minutes of Exercise per Session: Not on file  Stress: Patient Declined (06/12/2024)   [employer] of Occupational Health - Occupational Stress Questionnaire    Feeling of Stress: Patient declined  Social Connections: Unknown (06/12/2024)   Social Connection and Isolation Panel    Frequency of Communication with Friends and Family: Patient declined    Frequency of Social Gatherings with Friends and Family: Patient declined    Attends Religious Services: Patient declined    Database administrator or Organizations: Patient declined    Attends Engineer, structural: Not on file    Marital Status: Patient declined  Intimate Partner Violence: Not on file    Outpatient Medications Prior to Visit  Medication Sig Dispense Refill   bisoprolol  (ZEBETA ) 5 MG tablet Take 0.5 tablets (2.5 mg total) by mouth at bedtime. 45 tablet 3   cephALEXin  (KEFLEX ) 500 MG capsule Take 1 capsule (500 mg total) by mouth daily as needed post coital. 60 capsule 1   cholecalciferol (VITAMIN D ) 1000 UNITS tablet Take 2,000 Units by mouth daily.     clindamycin  (CLINDAGEL ) 1 % gel Apply 1 Application topically as needed.     famotidine  (PEPCID ) 20 MG tablet TAKE 1 TABLET BY MOUTH TWICE DAILY 60 tablet 5   fluticasone  (FLONASE ) 50 MCG/ACT nasal spray Administer 2 sprays in each nostril daily. 16 g 5   irbesartan  (AVAPRO ) 150 MG tablet Take 1 tablet (150 mg total) by mouth daily. 90 tablet 3   meclizine (ANTIVERT) 25 MG tablet Take 25 mg by mouth 2 (two) times daily as needed for dizziness.     methocarbamol (ROBAXIN) 500 MG tablet Take 500 mg by mouth every 6 (six) hours as needed.     metoprolol  succinate (TOPROL -XL) 25 MG 24 hr tablet Take 12.5 mg by mouth daily.  Multiple Vitamin (MULTIVITAMIN ADULT PO) Take by mouth.     OSCIMIN  0.125 MG SUBL PLACE 1 TABLET (0.125 MG TOTAL) UNDER THE TONGUE EVERY 4 (FOUR) HOURS AS NEEDED. 60 tablet 0   RETIN-A 0.025 % cream      triamcinolone  cream (KENALOG ) 0.1 % Apply 1 Application topically 2 (two) times daily. 30 g 1   vitamin B-12 (CYANOCOBALAMIN ) 250 MCG tablet 500 mcg.     No facility-administered medications prior to visit.    Allergies  Allergen Reactions   Buspirone  Other (See Comments)     nightmare   Esomeprazole  Other (See Comments)    headache   Ezetimibe      Abdominal pain, diarrhea   Lansoprazole      Abdominal pain   Omeprazole      Headache   Proton Pump Inhibitors    Sulfonamide Derivatives Rash     Rash     ROS See HPI    Objective:    Physical Exam Constitutional:      General: She is not in acute distress.    Appearance: She is not ill-appearing.  HENT:     Head: Atraumatic.     Jaw: No tenderness or pain on movement.     Right Ear: Tympanic membrane and ear canal normal. No tenderness. No mastoid tenderness.     Left Ear: Tympanic membrane and ear canal normal. No tenderness. No mastoid tenderness.     Nose: Nose normal.     Comments: No TTP over temporal artery, pulse normal     Mouth/Throat:     Mouth: Mucous membranes are moist.     Pharynx: Oropharynx is clear.  Eyes:     General: No visual field deficit.    Extraocular Movements: Extraocular movements intact.     Conjunctiva/sclera: Conjunctivae normal.     Pupils: Pupils are equal, round, and reactive to light.  Cardiovascular:     Rate and Rhythm: Normal rate and regular rhythm.  Pulmonary:     Effort: Pulmonary effort is normal.     Breath sounds: Normal breath sounds.  Abdominal:     General: Bowel sounds are normal. There is no distension.     Palpations: Abdomen is soft.     Tenderness: There is no abdominal tenderness. There is no guarding.  Musculoskeletal:        General: Normal range of motion.      Cervical back: Normal range of motion and neck supple.  Lymphadenopathy:     Cervical: No cervical adenopathy.  Skin:    General: Skin is warm and dry.     Findings: No rash.  Neurological:     General: No focal deficit present.     Mental Status: She is alert and oriented to person, place, and time.     Cranial Nerves: No cranial nerve deficit or facial asymmetry.     Sensory: No sensory deficit.     Motor: No weakness.     Coordination: Coordination normal.     Gait: Gait normal.  Psychiatric:        Mood and Affect: Mood normal.        Speech: Speech normal.        Behavior: Behavior normal.        Thought Content: Thought content normal.      BP 124/76   Pulse 68   Temp 97.6 F (36.4 C) (Temporal)   Ht 5' 1 (1.549 m)   Wt 128 lb (58.1 kg)   LMP  11/15/2018   SpO2 96%   BMI 24.19 kg/m  Wt Readings from Last 3 Encounters:  06/16/24 128 lb (58.1 kg)  05/01/24 132 lb (59.9 kg)  03/17/24 132 lb (59.9 kg)       Assessment & Plan:   Problem List Items Addressed This Visit     Constipation   Relevant Orders   CBC (Completed)   Comprehensive metabolic panel with GFR (Completed)   TSH (Completed)   GERD (gastroesophageal reflux disease)   Memory changes   Relevant Orders   Vitamin B12 (Completed)   TSH (Completed)   Other Visit Diagnoses       Nausea    -  Primary   Relevant Orders   CBC (Completed)   Comprehensive metabolic panel with GFR (Completed)   TSH (Completed)     Frequent episodic tension-type headache       Relevant Orders   CBC (Completed)   Sedimentation rate (Completed)   C-reactive protein (Completed)   TSH (Completed)     Dizziness       Relevant Orders   CBC (Completed)   Comprehensive metabolic panel with GFR (Completed)   TSH (Completed)      Assessment and Plan Assessment & Plan Headache with associated nausea and unintentional weight loss Nausea persists but is not associated with headaches. Weight loss is due to  reduced food intake from nausea. -reviewed negative brain MRI results from 05/2023 -recommend scheduling with neurologist for recurrent headaches since no obvious etiology and no red flag symptoms -Try consistent routine in the mornings with caffeine, water, food, and again at lunch to see if this affects headache pattern. Continue to look for triggers.  -check CBC, CMP, TSH, sed rate, CRP  Constipation Chronic constipation with no daily bowel movements since December 2023. Previous CT scan was normal. No recent changes in bowel habits. - Recommend probiotic to improve gut health.  Gastroesophageal reflux disease Acid reflux with previous throat burning, now resolved. Nausea persists. No EGD in the past five years.  Dizziness and lightheadedness Intermittent dizziness and lightheadedness, including when lying down. Previous cardiac evaluation showed SVTs.  No chest pain or syncope.  - Order blood work to rule out underlying etiology    I am having Nicole Crosby maintain her cholecalciferol, meclizine, Oscimin , methocarbamol, fluticasone , clindamycin , Multiple Vitamin (MULTIVITAMIN ADULT PO), famotidine , triamcinolone  cream, vitamin B-12, Retin-A, cephALEXin , irbesartan , metoprolol  succinate, and bisoprolol .  No orders of the defined types were placed in this encounter.

## 2024-06-16 NOTE — Patient Instructions (Signed)
 Please go downstairs for labs before you leave.  I recommend that you call and schedule with your neurologist  Be sure to drink water, consistent caffeine and avoid skipping meals

## 2024-07-12 ENCOUNTER — Encounter: Payer: Self-pay | Admitting: Family Medicine

## 2024-07-12 ENCOUNTER — Ambulatory Visit: Payer: Self-pay | Admitting: Family Medicine

## 2024-07-12 ENCOUNTER — Ambulatory Visit: Payer: 59 | Admitting: Family Medicine

## 2024-07-12 VITALS — BP 118/76 | HR 63 | Temp 97.6°F | Ht 61.0 in | Wt 129.0 lb

## 2024-07-12 DIAGNOSIS — Z0001 Encounter for general adult medical examination with abnormal findings: Secondary | ICD-10-CM

## 2024-07-12 DIAGNOSIS — Z8262 Family history of osteoporosis: Secondary | ICD-10-CM | POA: Insufficient documentation

## 2024-07-12 DIAGNOSIS — M810 Age-related osteoporosis without current pathological fracture: Secondary | ICD-10-CM | POA: Diagnosis not present

## 2024-07-12 DIAGNOSIS — E782 Mixed hyperlipidemia: Secondary | ICD-10-CM

## 2024-07-12 DIAGNOSIS — I491 Atrial premature depolarization: Secondary | ICD-10-CM | POA: Insufficient documentation

## 2024-07-12 DIAGNOSIS — M47812 Spondylosis without myelopathy or radiculopathy, cervical region: Secondary | ICD-10-CM | POA: Insufficient documentation

## 2024-07-12 DIAGNOSIS — Z85828 Personal history of other malignant neoplasm of skin: Secondary | ICD-10-CM | POA: Insufficient documentation

## 2024-07-12 DIAGNOSIS — R7303 Prediabetes: Secondary | ICD-10-CM | POA: Diagnosis not present

## 2024-07-12 DIAGNOSIS — I10 Essential (primary) hypertension: Secondary | ICD-10-CM

## 2024-07-12 DIAGNOSIS — I700 Atherosclerosis of aorta: Secondary | ICD-10-CM | POA: Insufficient documentation

## 2024-07-12 DIAGNOSIS — E559 Vitamin D deficiency, unspecified: Secondary | ICD-10-CM

## 2024-07-12 DIAGNOSIS — R931 Abnormal findings on diagnostic imaging of heart and coronary circulation: Secondary | ICD-10-CM | POA: Insufficient documentation

## 2024-07-12 LAB — HEMOGLOBIN A1C: Hgb A1c MFr Bld: 6.1 % (ref 4.6–6.5)

## 2024-07-12 LAB — CBC WITH DIFFERENTIAL/PLATELET
Basophils Absolute: 0 K/uL (ref 0.0–0.1)
Basophils Relative: 0.9 % (ref 0.0–3.0)
Eosinophils Absolute: 0.1 K/uL (ref 0.0–0.7)
Eosinophils Relative: 1.7 % (ref 0.0–5.0)
HCT: 40.7 % (ref 36.0–46.0)
Hemoglobin: 13.6 g/dL (ref 12.0–15.0)
Lymphocytes Relative: 45.8 % (ref 12.0–46.0)
Lymphs Abs: 2.3 K/uL (ref 0.7–4.0)
MCHC: 33.4 g/dL (ref 30.0–36.0)
MCV: 91.5 fl (ref 78.0–100.0)
Monocytes Absolute: 0.4 K/uL (ref 0.1–1.0)
Monocytes Relative: 7.1 % (ref 3.0–12.0)
Neutro Abs: 2.3 K/uL (ref 1.4–7.7)
Neutrophils Relative %: 44.5 % (ref 43.0–77.0)
Platelets: 301 K/uL (ref 150.0–400.0)
RBC: 4.44 Mil/uL (ref 3.87–5.11)
RDW: 12.6 % (ref 11.5–15.5)
WBC: 5.1 K/uL (ref 4.0–10.5)

## 2024-07-12 LAB — COMPREHENSIVE METABOLIC PANEL WITH GFR
ALT: 12 U/L (ref 0–35)
AST: 17 U/L (ref 0–37)
Albumin: 4.2 g/dL (ref 3.5–5.2)
Alkaline Phosphatase: 72 U/L (ref 39–117)
BUN: 10 mg/dL (ref 6–23)
CO2: 30 meq/L (ref 19–32)
Calcium: 9.3 mg/dL (ref 8.4–10.5)
Chloride: 101 meq/L (ref 96–112)
Creatinine, Ser: 0.61 mg/dL (ref 0.40–1.20)
GFR: 97.08 mL/min (ref >60.00)
Glucose, Bld: 92 mg/dL (ref 70–99)
Potassium: 3.8 meq/L (ref 3.5–5.1)
Sodium: 138 meq/L (ref 135–145)
Total Bilirubin: 0.4 mg/dL (ref 0.2–1.2)
Total Protein: 7.3 g/dL (ref 6.0–8.3)

## 2024-07-12 LAB — LIPID PANEL
Cholesterol: 189 mg/dL (ref 0–200)
HDL: 57.1 mg/dL (ref >39.00)
LDL Cholesterol: 106 mg/dL — ABNORMAL HIGH (ref 0–99)
NonHDL: 131.52
Total CHOL/HDL Ratio: 3
Triglycerides: 127 mg/dL (ref 0.0–149.0)
VLDL: 25.4 mg/dL (ref 0.0–40.0)

## 2024-07-12 LAB — TSH: TSH: 1.39 u[IU]/mL (ref 0.35–5.50)

## 2024-07-12 LAB — VITAMIN D 25 HYDROXY (VIT D DEFICIENCY, FRACTURES): VITD: 30.29 ng/mL (ref 30.00–100.00)

## 2024-07-12 NOTE — Progress Notes (Signed)
 Complete physical exam  Patient: Nicole Crosby   DOB: 1966-10-16   58 y.o. Female  MRN: [id number]  Subjective:    Chief Complaint  Patient presents with   Annual Exam    fasting   She is here for a complete physical exam.   Discussed the use of AI scribe software for clinical note transcription with the patient, who gave verbal consent to proceed.  History of Present Illness Nicole Crosby is a 58 year old female who presents for her annual physical exam.  Osteoporosis - T-score of -2.5 - Takes daily vitamin D  supplement 1000 IU - Takes magnesium  and vitamin B12 - Works as an X-ray/CT Tech   Hypertension  - Takes medication and sees cardiology   Gynecologic and breast health - Last Pap smear was normal - HPV test negative - Mammogram completed in February  Colorectal cancer screening - Colonoscopy up to date, not due until 2032  Immunizations - Plans to receive flu shot at work  Hyperlipidemia - History of elevated cholesterol levels - Last cholesterol check in September of previous year   Glycemic control - A1c was 5.8 in May  Preventive care - Maintains regular dental and eye care visits    Health Maintenance  Topic Date Due   HIV Screening  Never done   Hepatitis C Screening  Never done   Pneumococcal Vaccine for age over 16 (1 of 1 - PCV) Never done   Flu Shot  01/30/2025*   Mammogram  12/15/2025   DTaP/Tdap/Td vaccine (4 - Td or Tdap) 04/01/2026   Pap with HPV screening  08/09/2028   Colon Cancer Screening  02/01/2031   Zoster (Shingles) Vaccine  Completed   Hepatitis B Vaccine  Aged Out   HPV Vaccine  Aged Out   Meningitis B Vaccine  Aged Out   COVID-19 Vaccine  Discontinued  *Topic was postponed. The date shown is not the original due date.    Wears seatbelt always, uses sunscreen, smoke detectors in home and functioning, does not text while driving, feels safe in home environment.  Depression screening:    07/12/2024    8:10 AM  08/10/2023    8:01 AM 07/13/2023    8:02 AM  Depression screen PHQ 2/9  Decreased Interest 0 0 0  Down, Depressed, Hopeless 0 0 0  PHQ - 2 Score 0 0 0   Anxiety Screening:     No data to display           Patient Care Team: [name], NP-C as PCP - General (Family Medicine) Pietro Redell RAMAN, MD as PCP - Cardiology (Cardiology) Pietro Redell RAMAN, MD as Consulting Physician (Cardiology) Georjean Darice HERO, MD as Consulting Physician (Neurology)   Outpatient Medications Prior to Visit  Medication Sig   bisoprolol  (ZEBETA ) 5 MG tablet Take 0.5 tablets (2.5 mg total) by mouth at bedtime.   cephALEXin  (KEFLEX ) 500 MG capsule Take 1 capsule (500 mg total) by mouth daily as needed post coital.   cholecalciferol (VITAMIN D ) 1000 UNITS tablet Take 2,000 Units by mouth daily.   clindamycin  (CLINDAGEL ) 1 % gel Apply 1 Application topically as needed.   famotidine  (PEPCID ) 20 MG tablet TAKE 1 TABLET BY MOUTH TWICE DAILY   fluticasone  (FLONASE ) 50 MCG/ACT nasal spray Administer 2 sprays in each nostril daily.   irbesartan  (AVAPRO ) 150 MG tablet Take 1 tablet (150 mg total) by mouth daily.   meclizine (ANTIVERT) 25 MG tablet Take 25 mg by  mouth 2 (two) times daily as needed for dizziness.   methocarbamol (ROBAXIN) 500 MG tablet Take 500 mg by mouth every 6 (six) hours as needed.   metoprolol  succinate (TOPROL -XL) 25 MG 24 hr tablet Take 12.5 mg by mouth daily.   Multiple Vitamin (MULTIVITAMIN ADULT PO) Take by mouth.   OSCIMIN  0.125 MG SUBL PLACE 1 TABLET (0.125 MG TOTAL) UNDER THE TONGUE EVERY 4 (FOUR) HOURS AS NEEDED.   RETIN-A 0.025 % cream    triamcinolone  cream (KENALOG ) 0.1 % Apply 1 Application topically 2 (two) times daily.   vitamin B-12 (CYANOCOBALAMIN ) 250 MCG tablet 500 mcg.   No facility-administered medications prior to visit.    Review of Systems  Constitutional:  Negative for chills and fever.  HENT:  Negative for congestion, ear pain, sinus pain and sore throat.    Eyes:  Negative for blurred vision, double vision and pain.  Respiratory:  Negative for cough, shortness of breath and wheezing.   Cardiovascular:  Negative for chest pain, palpitations and leg swelling.  Gastrointestinal:  Negative for abdominal pain, constipation, diarrhea, nausea and vomiting.  Genitourinary:  Negative for dysuria, frequency and urgency.  Musculoskeletal:  Negative for back pain, joint pain and myalgias.  Skin:  Negative for rash.  Neurological:  Negative for dizziness, tingling, focal weakness and headaches.  Psychiatric/Behavioral:  Negative for depression. The patient is not nervous/anxious.        Objective:    BP 118/76   Pulse 63   Temp 97.6 F (36.4 C) (Temporal)   Ht 5' 1 (1.549 m)   Wt 129 lb (58.5 kg)   LMP 11/15/2018   SpO2 98%   BMI 24.37 kg/m  BP Readings from Last 3 Encounters:  07/12/24 118/76  06/16/24 124/76  05/01/24 131/77   Wt Readings from Last 3 Encounters:  07/12/24 129 lb (58.5 kg)  06/16/24 128 lb (58.1 kg)  05/01/24 132 lb (59.9 kg)    Physical Exam Constitutional:      General: She is not in acute distress.    Appearance: She is not ill-appearing.  HENT:     Right Ear: Tympanic membrane, ear canal and external ear normal.     Left Ear: Tympanic membrane, ear canal and external ear normal.     Nose: Nose normal.     Mouth/Throat:     Mouth: Mucous membranes are moist.     Pharynx: Oropharynx is clear.  Eyes:     Extraocular Movements: Extraocular movements intact.     Conjunctiva/sclera: Conjunctivae normal.     Pupils: Pupils are equal, round, and reactive to light.  Neck:     Thyroid : No thyroid  mass, thyromegaly or thyroid  tenderness.  Cardiovascular:     Rate and Rhythm: Normal rate and regular rhythm.     Pulses: Normal pulses.     Heart sounds: Normal heart sounds.  Pulmonary:     Effort: Pulmonary effort is normal.     Breath sounds: Normal breath sounds.  Abdominal:     General: Bowel sounds are  normal.     Palpations: Abdomen is soft.     Tenderness: There is no abdominal tenderness. There is no right CVA tenderness, left CVA tenderness, guarding or rebound.  Musculoskeletal:        General: Normal range of motion.     Cervical back: Normal range of motion and neck supple. No tenderness.     Right lower leg: No edema.     Left lower leg: No edema.  Lymphadenopathy:     Cervical: No cervical adenopathy.  Skin:    General: Skin is warm and dry.     Findings: No lesion or rash.  Neurological:     General: No focal deficit present.     Mental Status: She is alert and oriented to person, place, and time.     Cranial Nerves: No cranial nerve deficit.     Sensory: No sensory deficit.     Motor: No weakness.     Gait: Gait normal.  Psychiatric:        Mood and Affect: Mood normal.        Behavior: Behavior normal.        Thought Content: Thought content normal.      No results found for any visits on 07/12/24.    Assessment & Plan:    Routine Health Maintenance and Physical Exam Problem List Items Addressed This Visit     Age-related osteoporosis without current pathological fracture   Essential hypertension, benign   Relevant Orders   TSH   CBC with Differential/Platelet   Comprehensive metabolic panel with GFR   Hyperlipidemia   Relevant Orders   Lipid panel   Prediabetes   Relevant Orders   TSH   Hemoglobin A1c   CBC with Differential/Platelet   Comprehensive metabolic panel with GFR   Vitamin D  deficiency   Relevant Orders   VITAMIN D  25 Hydroxy (Vit-D Deficiency, Fractures)   Other Visit Diagnoses       Encounter for general adult medical examination with abnormal findings    -  Primary       Assessment and Plan Assessment & Plan Adult Wellness Visit Annual physical exam conducted. Regular gynecological exams, including Pap smears and mammograms, were discussed. Last Pap smear was normal with HPV negative. Last mammogram was in February 2025.  Colonoscopy is up to date until 2032. Dental and eye exams are current. - Encourage annual gynecological exams for breast and pelvic health. - Ensure mammogram is repeated annually. - Continue regular dental and eye exams.  Age-related osteoporosis without current pathological fracture Osteoporosis diagnosed with a T-score of -2.5 from a bone density scan in November 2023. Current management includes vitamin D  supplementation.  - Order repeat bone density scan in late November or early December 2025. - Continue vitamin D  supplementation. - She did not start medication for osteoporosis in 2023. Discuss medication options if bone density worsens.  Essential hypertension Hypertension is being managed with current medication regimen.  Mixed hyperlipidemia Cholesterol levels were last checked in September 2024 and were good, but have been elevated in the past. Current fasting status allows for re-evaluation. - Order cholesterol panel to assess current levels.  Prediabetes A1c was 5.8 in May 2025. - Recheck A1c levels.  Vitamin D  deficiency Vitamin D  supplementation is ongoing. Last vitamin D  level was checked in May 2023. - Order vitamin D  level to assess current status.     Return in about 1 year (around 07/12/2025).     Boby Mackintosh, NP-C

## 2024-07-12 NOTE — Patient Instructions (Signed)
 Remember you are due for your next bone density test late November 2025

## 2024-07-21 ENCOUNTER — Encounter: Payer: Self-pay | Admitting: Family Medicine

## 2024-07-21 DIAGNOSIS — R413 Other amnesia: Secondary | ICD-10-CM

## 2024-07-26 ENCOUNTER — Other Ambulatory Visit (HOSPITAL_BASED_OUTPATIENT_CLINIC_OR_DEPARTMENT_OTHER): Payer: Self-pay

## 2024-08-09 DIAGNOSIS — H9313 Tinnitus, bilateral: Secondary | ICD-10-CM | POA: Diagnosis not present

## 2024-08-09 DIAGNOSIS — H9041 Sensorineural hearing loss, unilateral, right ear, with unrestricted hearing on the contralateral side: Secondary | ICD-10-CM | POA: Diagnosis not present

## 2024-08-17 DIAGNOSIS — Z1389 Encounter for screening for other disorder: Secondary | ICD-10-CM | POA: Diagnosis not present

## 2024-08-17 DIAGNOSIS — Z01419 Encounter for gynecological examination (general) (routine) without abnormal findings: Secondary | ICD-10-CM | POA: Diagnosis not present

## 2024-08-25 ENCOUNTER — Encounter: Payer: Self-pay | Admitting: Family Medicine

## 2024-08-25 ENCOUNTER — Ambulatory Visit: Admitting: Family Medicine

## 2024-08-25 VITALS — BP 118/80 | HR 69 | Temp 97.8°F | Ht 61.0 in | Wt 126.0 lb

## 2024-08-25 DIAGNOSIS — R194 Change in bowel habit: Secondary | ICD-10-CM | POA: Diagnosis not present

## 2024-08-25 DIAGNOSIS — R413 Other amnesia: Secondary | ICD-10-CM | POA: Diagnosis not present

## 2024-08-25 DIAGNOSIS — R634 Abnormal weight loss: Secondary | ICD-10-CM | POA: Diagnosis not present

## 2024-08-25 DIAGNOSIS — R6881 Early satiety: Secondary | ICD-10-CM | POA: Diagnosis not present

## 2024-08-25 DIAGNOSIS — R519 Headache, unspecified: Secondary | ICD-10-CM

## 2024-08-25 DIAGNOSIS — R1084 Generalized abdominal pain: Secondary | ICD-10-CM

## 2024-08-25 DIAGNOSIS — G8929 Other chronic pain: Secondary | ICD-10-CM

## 2024-08-25 DIAGNOSIS — R4789 Other speech disturbances: Secondary | ICD-10-CM

## 2024-08-25 NOTE — Progress Notes (Signed)
 Subjective:     Patient ID: Nicole Crosby, female    DOB: 1966/08/23, 58 y.o.   MRN: [id number]  Chief Complaint  Patient presents with   Follow-up    Wants imaging since she can't be seen by specialist until december    HPI  Discussed the use of AI scribe software for clinical note transcription with the patient, who gave verbal consent to proceed.  History of Present Illness Nicole Crosby is a 58 year old female who presents with ongoing gastrointestinal symptoms and requests imaging.  Gastrointestinal symptoms - Ongoing symptoms for two years, including abdominal pain after eating, nausea, and decreased appetite - Abdominal pain is central, sometimes radiating to the middle or lower abdomen - Eight-pound weight loss since July - Early satiety, with cessation of eating after a few bites - Persistent nausea after eating - Change in bowel habits, with sensation of partial obstruction noticed this year - Burning in throat after consuming acidic or spicy foods, partially managed with reflux medication but symptoms persist - Fiber use without improvement - Unable to adhere to FODMAP diet - Probiotic use  Imaging findings - CT scan in December 2023 revealed an 11 mm stable adrenal gland nodule  Cognitive and neurological symptoms - Intermittent memory problems, confusion, and headaches, particularly in the occipital region - Difficulty finding words - Incidents at work involving forgetting steps  Ophthalmologic evaluation - Eye doctor evaluation ruled out eye-related causes for neurological symptoms     Health Maintenance Due  Topic Date Due   HIV Screening  Never done   Hepatitis C Screening  Never done   Pneumococcal Vaccine: 50+ Years (1 of 1 - PCV) Never done    Past Medical History:  Diagnosis Date   Abnormal CT scan of heart 07/12/2024   Age-related osteoporosis without current pathological fracture 07/12/2024   Allergy    Buspirone , Sulfa,  Pantoprazole    Benign fundic gland polyps of stomach 02/12/2016   Cervical spondylosis without myelopathy 07/12/2024   Chicken pox    Dizziness    Eczema    Family history of osteoporosis 07/12/2024   Functional dyspepsia    GERD (gastroesophageal reflux disease)    H/O adenomatous polyp of colon 10/04/2014   09/2014 - 3 mm adenoma - repeat colonoscopy 2022   History of basal cell carcinoma (BCC) 07/12/2024   Hyperlipidemia    no meds   Hypertension    pt denies   IBS (irritable bowel syndrome)    Insomnia    MEDIAL EPICONDYLITIS, RIGHT 07/17/2010   Nasal turbinate hypertrophy 09/08/2022   Nonulcer dyspepsia 02/12/2016   Other abnormal glucose    Tiny hypodensity left hepatic lobe-CT of abdomen and pelvis 07/26/2008-small 8mm periumblical hernia   PAC (premature atrial contraction)    Pain in right elbow 08/31/2018   Palpitations    Supraventricular premature beats 07/12/2024   Tinnitus, bilateral 09/21/2018    Past Surgical History:  Procedure Laterality Date   CESAREAN SECTION  01/2001   COLONOSCOPY     DE QUERVAIN'S RELEASE  2000   Right wrist area   ESOPHAGOGASTRODUODENOSCOPY     EYE SURGERY  ?2018   Right eye to relieve pressure    Family History  Problem Relation Age of Onset   Hypertension Mother    Hyperlipidemia Mother    Fibrocystic breast disease Mother    Hearing loss Mother    Osteopenia Mother    Dementia Mother    Stroke Mother  Prostate cancer Father 62       deceased   Hypertension Father    Diabetes Father    Kidney disease Father    Heart disease Father        MI   Hyperlipidemia Father    Cancer Father    Thyroid  disease Sister    Diabetes Sister        pre-diabetes   Hypertension Sister    Arthritis Sister    Hyperlipidemia Sister    Alcohol abuse Sister    Diabetes Sister    Hypertension Sister    Hyperlipidemia Sister    Fibrocystic breast disease Sister    Neuropathy Sister    Hyperlipidemia Sister    Hypertension Sister     Other Brother        unknown   Colon cancer Maternal Uncle        died in late 4's   Diabetes Maternal Grandmother    Prostate cancer Maternal Grandfather    COPD Maternal Grandfather    Diabetes Paternal Grandmother    Rectal cancer Neg Hx    Stomach cancer Neg Hx    Esophageal cancer Neg Hx    Pancreatic cancer Neg Hx     Social History   Socioeconomic History   Marital status: Married    Spouse name: Not on file   Number of children: 2   Years of education: Not on file   Highest education level: Associate degree: occupational, Scientist, product/process development, or vocational program  Occupational History   Occupation: Xray/CT Theme park manager: [employer]  Tobacco Use   Smoking status: Never    Passive exposure: Past   Smokeless tobacco: Never  Vaping Use   Vaping status: Never Used  Substance and Sexual Activity   Alcohol use: Yes    Comment: Occasional   Drug use: No   Sexual activity: Yes    Birth control/protection: Post-menopausal  Other Topics Concern   Not on file  Social History Narrative   Occupation: Engineer, 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    Alcohol Use - yes-wine         Full Time    Married           Social Drivers of Health   Financial Resource Strain: Patient Declined (08/23/2024)   Overall Financial Resource Strain (CARDIA)    Difficulty of Paying Living Expenses: Patient declined  Food Insecurity: Patient Declined (08/23/2024)   Hunger Vital Sign    Worried About Running Out of Food in the Last Year: Patient declined    Ran Out of Food in the Last Year: Patient declined  Transportation Needs: Patient Declined (08/23/2024)   PRAPARE - Administrator, Civil Service (Medical): Patient declined    Lack of Transportation (Non-Medical): Patient declined  Physical Activity: Unknown (08/23/2024)   Exercise Vital Sign    Days of Exercise per Week: Patient declined    Minutes of Exercise per Session: Not on file  Stress: Patient Declined  (08/23/2024)   [employer] of Occupational Health - Occupational Stress Questionnaire    Feeling of Stress: Patient declined  Social Connections: Unknown (08/23/2024)   Social Connection and Isolation Panel    Frequency of Communication with Friends and Family: Patient declined    Frequency of Social Gatherings with Friends and Family: Patient declined    Attends Religious Services: Patient declined    Database administrator or Organizations: Patient declined  Attends Banker Meetings: Not on file    Marital Status: Patient declined  Intimate Partner Violence: Not on file    Outpatient Medications Prior to Visit  Medication Sig Dispense Refill   bisoprolol  (ZEBETA ) 5 MG tablet Take 0.5 tablets (2.5 mg total) by mouth at bedtime. 45 tablet 3   cephALEXin  (KEFLEX ) 500 MG capsule Take 1 capsule (500 mg total) by mouth daily as needed post coital. 60 capsule 1   cholecalciferol (VITAMIN D ) 1000 UNITS tablet Take 2,000 Units by mouth daily.     clindamycin  (CLINDAGEL ) 1 % gel Apply 1 Application topically as needed.     famotidine  (PEPCID ) 20 MG tablet TAKE 1 TABLET BY MOUTH TWICE DAILY 60 tablet 5   fluticasone  (FLONASE ) 50 MCG/ACT nasal spray Administer 2 sprays in each nostril daily. 16 g 5   irbesartan  (AVAPRO ) 150 MG tablet Take 1 tablet (150 mg total) by mouth daily. 90 tablet 3   meclizine (ANTIVERT) 25 MG tablet Take 25 mg by mouth 2 (two) times daily as needed for dizziness.     methocarbamol (ROBAXIN) 500 MG tablet Take 500 mg by mouth every 6 (six) hours as needed.     metoprolol  succinate (TOPROL -XL) 25 MG 24 hr tablet Take 12.5 mg by mouth daily.     Multiple Vitamin (MULTIVITAMIN ADULT PO) Take by mouth.     OSCIMIN  0.125 MG SUBL PLACE 1 TABLET (0.125 MG TOTAL) UNDER THE TONGUE EVERY 4 (FOUR) HOURS AS NEEDED. 60 tablet 0   RETIN-A 0.025 % cream      triamcinolone  cream (KENALOG ) 0.1 % Apply 1 Application topically 2 (two) times daily. 30 g 1   vitamin B-12  (CYANOCOBALAMIN ) 250 MCG tablet 500 mcg.     No facility-administered medications prior to visit.    Allergies  Allergen Reactions   Buspirone  Other (See Comments)     nightmare   Esomeprazole  Other (See Comments)    headache   Ezetimibe      Abdominal pain, diarrhea   Lansoprazole      Abdominal pain   Omeprazole      Headache   Proton Pump Inhibitors    Sulfonamide Derivatives Rash     Rash    ROS Per HPI    Objective:    Physical Exam Constitutional:      General: She is not in acute distress.    Appearance: She is not ill-appearing.  HENT:     Right Ear: Tympanic membrane and ear canal normal.     Left Ear: Tympanic membrane and ear canal normal.     Nose: Nose normal.     Mouth/Throat:     Mouth: Mucous membranes are moist.     Tongue: Tongue does not deviate from midline.     Pharynx: Oropharynx is clear.  Eyes:     General: Lids are normal. No visual field deficit.    Extraocular Movements: Extraocular movements intact.     Conjunctiva/sclera: Conjunctivae normal.     Pupils: Pupils are equal, round, and reactive to light.  Cardiovascular:     Rate and Rhythm: Normal rate and regular rhythm.  Pulmonary:     Effort: Pulmonary effort is normal.     Breath sounds: Normal breath sounds.  Abdominal:     General: Abdomen is flat. Bowel sounds are normal.     Palpations: Abdomen is soft.     Tenderness: There is abdominal tenderness in the right lower quadrant. There is no right CVA tenderness, left CVA  tenderness, guarding or rebound. Negative signs include Murphy's sign and McBurney's sign.  Musculoskeletal:        General: Normal range of motion.     Cervical back: Normal range of motion and neck supple. No tenderness.     Right lower leg: No edema.     Left lower leg: No edema.  Lymphadenopathy:     Cervical: No cervical adenopathy.  Skin:    General: Skin is warm and dry.  Neurological:     General: No focal deficit present.     Mental Status: She is  alert and oriented to person, place, and time.     Cranial Nerves: No cranial nerve deficit or facial asymmetry.     Sensory: Sensation is intact.     Motor: No weakness, abnormal muscle tone or pronator drift.     Coordination: Romberg sign negative. Coordination normal.     Gait: Gait normal.  Psychiatric:        Mood and Affect: Mood normal.        Behavior: Behavior normal.        Thought Content: Thought content normal.      BP 118/80   Pulse 69   Temp 97.8 F (36.6 C) (Temporal)   Ht 5' 1 (1.549 m)   Wt 126 lb (57.2 kg)   LMP 11/15/2018   SpO2 99%   BMI 23.81 kg/m  Wt Readings from Last 3 Encounters:  08/25/24 126 lb (57.2 kg)  07/12/24 129 lb (58.5 kg)  06/16/24 128 lb (58.1 kg)       Assessment & Plan:   Problem List Items Addressed This Visit     Abdominal pain   Relevant Orders   CT ABDOMEN PELVIS W CONTRAST   Memory changes   Relevant Orders   MR BRAIN W WO CONTRAST   Other Visit Diagnoses       Change in bowel habits    -  Primary   Relevant Orders   CT ABDOMEN PELVIS W CONTRAST     Early satiety       Relevant Orders   CT ABDOMEN PELVIS W CONTRAST     Weight loss       Relevant Orders   CT ABDOMEN PELVIS W CONTRAST     Word finding difficulty       Relevant Orders   MR BRAIN W WO CONTRAST     Chronic headache with new features       Relevant Orders   MR BRAIN W WO CONTRAST       Assessment and Plan Assessment & Plan Chronic gastrointestinal symptoms with weight loss, early satiety, and change in bowel habits Chronic gastrointestinal symptoms for two years with weight loss, early satiety, and change in bowel habits. Symptoms include abdominal pain, nausea, and decreased appetite. Previous CT abdomen and pelvis with contrast in December 2023 showed a benign adrenal adenoma. Differential includes partial bowel obstruction or IBS. Reports difficulty with bowel movements, possibly indicating partial obstruction. - Order CT abdomen and pelvis  with contrast   Chronic headache with memory problems and confusion Chronic headache with new features, memory problems, and confusion. Symptoms include pain in the back of the head and episodes of confusion affecting work performance. Previous eye examination ruled out ocular causes.  - Order MRI of the brain with and without contrast  - Appt with neurology not until December   New headache location New headache location in the back of the head associated with  memory problems and confusion. - Order MRI of the brain with and without contrast      I am having Nevelyn B. Mcnerney maintain her cholecalciferol, meclizine, Oscimin , methocarbamol, fluticasone , clindamycin , Multiple Vitamin (MULTIVITAMIN ADULT PO), famotidine , triamcinolone  cream, vitamin B-12, Retin-A, cephALEXin , irbesartan , metoprolol  succinate, and bisoprolol .  No orders of the defined types were placed in this encounter.

## 2024-08-28 ENCOUNTER — Ambulatory Visit (INDEPENDENT_AMBULATORY_CARE_PROVIDER_SITE_OTHER)

## 2024-08-28 ENCOUNTER — Ambulatory Visit

## 2024-08-28 DIAGNOSIS — R109 Unspecified abdominal pain: Secondary | ICD-10-CM | POA: Diagnosis not present

## 2024-08-28 DIAGNOSIS — R519 Headache, unspecified: Secondary | ICD-10-CM | POA: Diagnosis not present

## 2024-08-28 DIAGNOSIS — R6881 Early satiety: Secondary | ICD-10-CM | POA: Diagnosis not present

## 2024-08-28 DIAGNOSIS — H5712 Ocular pain, left eye: Secondary | ICD-10-CM | POA: Diagnosis not present

## 2024-08-28 DIAGNOSIS — R1084 Generalized abdominal pain: Secondary | ICD-10-CM

## 2024-08-28 DIAGNOSIS — G8929 Other chronic pain: Secondary | ICD-10-CM

## 2024-08-28 DIAGNOSIS — R413 Other amnesia: Secondary | ICD-10-CM | POA: Diagnosis not present

## 2024-08-28 DIAGNOSIS — R634 Abnormal weight loss: Secondary | ICD-10-CM

## 2024-08-28 DIAGNOSIS — R4789 Other speech disturbances: Secondary | ICD-10-CM

## 2024-08-28 DIAGNOSIS — R194 Change in bowel habit: Secondary | ICD-10-CM | POA: Diagnosis not present

## 2024-08-28 MED ORDER — IOHEXOL 9 MG/ML PO SOLN: 500.0000 mL | ORAL | Status: AC

## 2024-08-28 MED ORDER — IOHEXOL 300 MG/ML  SOLN
100.0000 mL | Freq: Once | INTRAMUSCULAR | Status: AC | PRN
  Administered 2024-08-28: 100 mL via INTRAVENOUS

## 2024-08-28 MED ORDER — GADOBUTROL 1 MMOL/ML IV SOLN
5.5000 mL | Freq: Once | INTRAVENOUS | Status: AC | PRN
Start: 2024-08-28 — End: 2024-08-28
  Administered 2024-08-28: 5.5 mL via INTRAVENOUS

## 2024-08-29 ENCOUNTER — Other Ambulatory Visit (HOSPITAL_BASED_OUTPATIENT_CLINIC_OR_DEPARTMENT_OTHER): Payer: Self-pay

## 2024-08-29 ENCOUNTER — Ambulatory Visit: Payer: Self-pay | Admitting: Family Medicine

## 2024-08-29 MED ORDER — FLUZONE 0.5 ML IM SUSY
0.5000 mL | PREFILLED_SYRINGE | Freq: Once | INTRAMUSCULAR | 0 refills | Status: AC
  Filled 2024-08-29: qty 0.5, 1d supply, fill #0

## 2024-10-02 ENCOUNTER — Other Ambulatory Visit (HOSPITAL_BASED_OUTPATIENT_CLINIC_OR_DEPARTMENT_OTHER): Payer: Self-pay

## 2024-10-02 ENCOUNTER — Ambulatory Visit
Admission: RE | Admit: 2024-10-02 | Discharge: 2024-10-02 | Disposition: A | Discharge status: Home / Self Care | Attending: Family Medicine | Admitting: Family Medicine

## 2024-10-02 ENCOUNTER — Ambulatory Visit

## 2024-10-02 VITALS — BP 151/88 | HR 84 | Temp 98.2°F | Resp 18 | Ht 61.0 in | Wt 128.0 lb

## 2024-10-02 DIAGNOSIS — R10A2 Flank pain, left side: Secondary | ICD-10-CM | POA: Diagnosis not present

## 2024-10-02 DIAGNOSIS — K5900 Constipation, unspecified: Secondary | ICD-10-CM

## 2024-10-02 DIAGNOSIS — R109 Unspecified abdominal pain: Secondary | ICD-10-CM

## 2024-10-02 DIAGNOSIS — R3129 Other microscopic hematuria: Secondary | ICD-10-CM | POA: Diagnosis not present

## 2024-10-02 DIAGNOSIS — K7689 Other specified diseases of liver: Secondary | ICD-10-CM | POA: Diagnosis not present

## 2024-10-02 LAB — POCT URINE DIPSTICK
Bilirubin, UA: NEGATIVE
Glucose, UA: NEGATIVE mg/dL
Ketones, POC UA: NEGATIVE mg/dL
Leukocytes, UA: NEGATIVE
Nitrite, UA: NEGATIVE
POC PROTEIN,UA: NEGATIVE
Spec Grav, UA: 1.01 (ref 1.010–1.025)
Urobilinogen, UA: 0.2 U/dL
pH, UA: 7 (ref 5.0–8.0)

## 2024-10-02 MED ORDER — METHOCARBAMOL 500 MG PO TABS
500.0000 mg | ORAL_TABLET | Freq: Three times a day (TID) | ORAL | 0 refills | Status: AC
  Filled 2024-10-02: qty 30, 10d supply, fill #0

## 2024-10-02 NOTE — ED Triage Notes (Signed)
 Patient c/o left side flank pain on and off for 2-3 months.  This weekend the pain became dull and started having nausea.  No history or kidney stones, increase of urinary frequency.  Patient has taken Ibuprofen.

## 2024-10-02 NOTE — Discharge Instructions (Addendum)
 Make sure you drink lots of water Consider MiraLAX for the constipation Take Tylenol, ibuprofen, Aleve as needed for the pain I will prescribe Robaxin as a mild muscle relaxer.  Take at bedtime.  If it does not cause drowsiness you may take during the day See your GI doctor as scheduled

## 2024-10-02 NOTE — ED Provider Notes (Signed)
 [facility]    CSN: [id number] Arrival date & time: 10/02/24  1553      History   Chief Complaint Chief Complaint  Patient presents with   Flank Pain    left, nausea - Entered by patient    HPI Nicole Crosby is a 58 y.o. female.   HPI  Patient states she has had flank pain off and on for couple of months.  It is worse in the last few days.  Its on the left side.  No nausea or vomiting.  Some radiation around to the abdomen.  She is concerned she has a kidney stone.  She works as a psychiatrist.  She is requesting a CT scan.  Patient has had multiple abdominal CT scans over the last few years.  I explained to her that by history and physical examination it was not clearly a stone, and that she had a negative CT abdomen on 08/29/2024.  Patient argued that she could have developed a new stone since then.  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  She states she has a urologist.  Past Medical History:  Diagnosis Date   Abnormal CT scan of heart 07/12/2024   Age-related osteoporosis without current pathological fracture 07/12/2024   Allergy    Buspirone , Sulfa, Pantoprazole    Benign fundic gland polyps of stomach 02/12/2016   Cervical spondylosis without myelopathy 07/12/2024   Chicken pox    Dizziness    Eczema    Family history of osteoporosis 07/12/2024   Functional dyspepsia    GERD (gastroesophageal reflux disease)    H/O adenomatous polyp of colon 10/04/2014   09/2014 - 3 mm adenoma - repeat colonoscopy 2022   History of basal cell carcinoma (BCC) 07/12/2024   Hyperlipidemia    no meds   Hypertension    pt denies   IBS (irritable bowel syndrome)    Insomnia    MEDIAL EPICONDYLITIS, RIGHT 07/17/2010   Nasal turbinate hypertrophy 09/08/2022   Nonulcer dyspepsia 02/12/2016   Other abnormal glucose    Tiny hypodensity left hepatic lobe-CT of abdomen and pelvis 07/26/2008-small 8mm periumblical hernia    PAC (premature atrial contraction)    Pain in right elbow 08/31/2018   Palpitations    Supraventricular premature beats 07/12/2024   Tinnitus, bilateral 09/21/2018    Patient Active Problem List   Diagnosis Date Noted   Age-related osteoporosis without current pathological fracture 07/12/2024   Atherosclerosis of aorta 07/12/2024   Near syncope 03/17/2024   Bloating 09/09/2023   Rectal pressure 09/09/2023   Left ankle pain 08/11/2023   Incomplete bladder emptying 04/01/2023   Prediabetes 04/01/2023   Hematuria 04/01/2023   Daytime somnolence 04/01/2023   Subacute lumbar radiculopathy 03/16/2023   Basal cell carcinoma 02/10/2021   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   Nausea and vomiting 12/25/2019   Abdominal pain 12/25/2019   Constipation 12/25/2019   Change in bowel habit 12/25/2019   Hypokalemia 09/20/2019   Flexural eczema 05/17/2018   Eyelid dermatitis, allergic/contact 05/17/2018   Medial epicondylitis, right 03/17/2018   Ulnar nerve entrapment at elbow, 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   Vitamin D  deficiency 11/03/2017   Shortened PR interval 09/27/2017   Hx of laser iridotomy 09/02/2017   Greater trochanteric bursitis of left hip 05/12/2017   B12 deficiency 01/22/2017   Memory changes 08/28/2016   Nonulcer dyspepsia 02/12/2016   Benign fundic gland polyps of stomach 02/12/2016   Paresthesia 12/17/2015   Bladder spasm 12/13/2014  H/O adenomatous polyp of colon 10/04/2014   Screening for malignant neoplasm of cervix 05/31/2013   Physical exam 05/31/2013   Left L3 lumbar radiculitis 09/23/2012   Neck pain 08/22/2012   Adrenal nodule 03/02/2012   Essential hypertension, benign 03/02/2012   IBS (irritable bowel syndrome)    PAC (premature atrial contraction)    Insomnia    GERD (gastroesophageal reflux disease)    Palpitations 06/18/2009   Headache 09/21/2008   HEPATIC CYST 08/14/2008   Hyperlipidemia 08/06/2008    Past Surgical History:  Procedure Laterality Date    CESAREAN SECTION  01/2001   COLONOSCOPY     DE QUERVAIN'S RELEASE  2000   Right wrist area   ESOPHAGOGASTRODUODENOSCOPY     EYE SURGERY  ?2018   Right eye to relieve pressure    OB History     Gravida  2   Para  2   Term      Preterm      AB      Living         SAB      IAB      Ectopic      Multiple      Live Births               Home Medications    Prior to Admission medications   Medication Sig Start Date End Date Taking? Authorizing Provider  cholecalciferol (VITAMIN D ) 1000 UNITS tablet Take 2,000 Units by mouth daily.   Yes [provider]  irbesartan  (AVAPRO ) 150 MG tablet Take 1 tablet (150 mg total) by mouth daily. 10/26/23  Yes Pietro Redell RAMAN, MD  meclizine (ANTIVERT) 25 MG tablet Take 25 mg by mouth 2 (two) times daily as needed for dizziness.   Yes [provider]  methocarbamol (ROBAXIN) 500 MG tablet Take 1 tablet (500 mg total) by mouth 3 (three) times daily. 10/02/24  Yes Maranda Jamee Jacob, MD  Multiple Vitamin (MULTIVITAMIN ADULT PO) Take by mouth.   Yes [provider]  RETIN-A 0.025 % cream  04/05/23  Yes [provider]  clindamycin  (CLINDAGEL ) 1 % gel Apply 1 Application topically as needed.    [provider]  famotidine  (PEPCID ) 20 MG tablet TAKE 1 TABLET BY MOUTH TWICE DAILY 04/01/23 08/25/24  Henson, Vickie L, NP-C  OSCIMIN  0.125 MG SUBL PLACE 1 TABLET (0.125 MG TOTAL) UNDER THE TONGUE EVERY 4 (FOUR) HOURS AS NEEDED. 12/12/19   Avram Lupita BRAVO, MD  triamcinolone  cream (KENALOG ) 0.1 % Apply 1 Application topically 2 (two) times daily. 05/25/23       Family History Family History  Problem Relation Age of Onset   Hypertension Mother    Hyperlipidemia Mother    Fibrocystic breast disease Mother    Hearing loss Mother    Osteopenia Mother    Dementia Mother    Stroke Mother    Prostate cancer Father 30       deceased   Hypertension Father    Diabetes Father    Kidney disease Father     Heart disease Father        MI   Hyperlipidemia Father    Cancer Father    Thyroid  disease Sister    Diabetes Sister        pre-diabetes   Hypertension Sister    Arthritis Sister    Hyperlipidemia Sister    Alcohol abuse Sister    Diabetes Sister    Hypertension Sister    Hyperlipidemia  Sister    Fibrocystic breast disease Sister    Neuropathy Sister    Hyperlipidemia Sister    Hypertension Sister    Other Brother        unknown   Colon cancer Maternal Uncle        died in late 36's   Diabetes Maternal Grandmother    Prostate cancer Maternal Grandfather    COPD Maternal Grandfather    Diabetes Paternal Grandmother    Rectal cancer Neg Hx    Stomach cancer Neg Hx    Esophageal cancer Neg Hx    Pancreatic cancer Neg Hx     Social History Social History   Tobacco Use   Smoking status: Never    Passive exposure: Past   Smokeless tobacco: Never  Vaping Use   Vaping status: Never Used  Substance Use Topics   Alcohol use: Yes    Comment: Occasional   Drug use: No     Allergies   Buspirone , Esomeprazole , Ezetimibe , Lansoprazole , Omeprazole , Proton pump inhibitors, and Sulfonamide derivatives   Review of Systems Review of Systems See HPI  Physical Exam Triage Vital Signs ED Triage Vitals  Encounter Vitals Group     BP 10/02/24 1609 (!) 151/88     Girls Systolic BP Percentile --      Girls Diastolic BP Percentile --      Boys Systolic BP Percentile --      Boys Diastolic BP Percentile --      Pulse Rate 10/02/24 1609 84     Resp 10/02/24 1609 18     Temp 10/02/24 1609 98.2 F (36.8 C)     Temp Source 10/02/24 1609 Oral     SpO2 10/02/24 1609 99 %     Weight 10/02/24 1607 128 lb (58.1 kg)     Height 10/02/24 1607 5' 1 (1.549 m)     Head Circumference --      Peak Flow --      Pain Score 10/02/24 1607 5     Pain Loc --      Pain Education --      Exclude from Growth Chart --    No data found.  Updated Vital Signs BP (!) 151/88 (BP Location:  Right Arm)   Pulse 84   Temp 98.2 F (36.8 C) (Oral)   Resp 18   Ht 5' 1 (1.549 m)   Wt 58.1 kg   LMP 11/15/2018   SpO2 99%   BMI 24.19 kg/m       Physical Exam Constitutional:      General: She is not in acute distress.    Appearance: She is well-developed.  HENT:     Head: Normocephalic and atraumatic.  Eyes:     Conjunctiva/sclera: Conjunctivae normal.     Pupils: Pupils are equal, round, and reactive to light.  Cardiovascular:     Rate and Rhythm: Normal rate.  Pulmonary:     Effort: Pulmonary effort is normal. No respiratory distress.  Abdominal:     General: Abdomen is flat. There is no distension.     Palpations: Abdomen is soft.     Tenderness: There is no abdominal tenderness. There is no right CVA tenderness or left CVA tenderness.     Comments: Patient does not have any tenderness to deep palpation of the left abdomen consistent with kidney pain.  She does not have any CVA tenderness.  There is mild tenderness in the left flank and I believe it is musculoskeletal  Musculoskeletal:        General: Normal range of motion.     Cervical back: Normal range of motion.  Skin:    General: Skin is warm and dry.  Neurological:     Mental Status: She is alert.      UC Treatments / Results  Labs (all labs ordered are listed, but only abnormal results are displayed) Labs Reviewed  POCT URINE DIPSTICK - Abnormal; Notable for the following components:      Result Value   Color, UA light yellow (*)    Blood, UA trace-lysed (*)    All other components within normal limits    EKG   Radiology CT Renal Stone Study Result Date: 10/02/2024 EXAM: CT Abdomen and Pelvis without Intravenous Contrast CLINICAL HISTORY: Abdominal/flank pain, stone suspected TECHNIQUE: Axial computed tomography images of the abdomen and pelvis without intravenous contrast. Dose reduction technique was used including one or more of the following: automated exposure control, adjustment of mA  and kV according to patient size, and/or iterative reconstruction. CONTRAST: NONE COMPARISON: CT dated 08/28/2024. FINDINGS: LUNG BASES: Normal heart size. Clear lung bases. Minimal lingular scarring. LIVER: . Subcentimeter segment 4b hepatic cyst. GALLBLADDER AND BILE DUCTS: Unremarkable. No calcified stone. No ductal dilation. PANCREAS: Normal, without duct dilatation or acute inflammation. SPLEEN: Normal in size and morphology. ADRENAL GLANDS: 11 mm right adrenal nodule measures 38.2 cm. This has been similar back to at least 10/30/2022. KIDNEYS, URETERS, AND BLADDER: No renal mass. No hydronephrosis. No renal calculi. No ureteric or bladder calculi. No perinephric or periureteral stranding. STOMACH AND BOWEL: Normal stomach, without wall thickening. Normal small bowel caliber. Colonic stool burden suggests constipation. No obstruction. APPENDIX: Appendix not visualized. PERITONEUM: No free fluid. No free air. LYMPH NODES: No lymphadenopathy. REPRODUCTIVE: Normal uterus, without adnexal mass. VASCULATURE: Abdominal aortic atherosclerosis. ABDOMINAL WALL AND SOFT TISSUES: Unremarkable. BONES: No  osseous abnormality. IMPRESSION: 1. No acute findings. 2. No urinary tract calculi or hydronephrosis. 3. Colonic stool burden suggests constipation. No other explanation for flank pain. 4. 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 5. Aortic atherosclerosis (ICD-10 I70.0). Electronically signed by: Rockey Kilts MD 10/02/2024 05:51 PM EST RP Workstation: [id number]    Procedures Procedures (including critical care time)  Medications Ordered in UC Medications - No data to display  Initial Impression / Assessment and Plan / UC Course  I have reviewed the triage vital signs and the nursing notes.  Pertinent labs & imaging results that were available during my care of the patient were reviewed by me and  considered in my medical decision making (see chart for details).     Discussed result with patient.  Follow-up with PCP Final Clinical Impressions(s) / UC Diagnoses   Final diagnoses:  Abdominal pain, unspecified abdominal location  Left flank pain  Persistent microscopic hematuria  Constipation, unspecified constipation type     Discharge Instructions      Make sure you drink lots of water Consider MiraLAX for the constipation Take Tylenol, ibuprofen, Aleve as needed for the pain I will prescribe Robaxin as a mild muscle relaxer.  Take at bedtime.  If it does not cause drowsiness you may take during the day See your GI doctor as scheduled   ED Prescriptions     Medication Sig Dispense Auth. Provider   methocarbamol (ROBAXIN) 500 MG tablet Take 1 tablet (500 mg total) by mouth  3 (three) times daily. 30 tablet Maranda Jamee Jacob, MD      PDMP not reviewed this encounter.   Maranda Jamee Jacob, MD 10/02/24 [phone]

## 2024-10-04 ENCOUNTER — Ambulatory Visit: Admitting: Neurology

## 2024-10-04 ENCOUNTER — Encounter: Payer: Self-pay | Admitting: Neurology

## 2024-10-04 VITALS — BP 140/77 | HR 73 | Ht 61.0 in | Wt 132.0 lb

## 2024-10-04 DIAGNOSIS — G44209 Tension-type headache, unspecified, not intractable: Secondary | ICD-10-CM

## 2024-10-04 DIAGNOSIS — I739 Peripheral vascular disease, unspecified: Secondary | ICD-10-CM

## 2024-10-04 DIAGNOSIS — R419 Unspecified symptoms and signs involving cognitive functions and awareness: Secondary | ICD-10-CM

## 2024-10-04 NOTE — Progress Notes (Signed)
 [facility]  PATIENT: Nicole Crosby DOB: Feb 16, 1966  REQUESTING CLINICIAN: [name], NP-C HISTORY FROM: Patient  REASON FOR VISIT: Memory loss     HISTORICAL  CHIEF COMPLAINT:  Chief Complaint  Patient presents with   RM12/MEMORY    Pt is here Alone. Pt states that her memory issues started when she had Covid.     HISTORY OF PRESENT ILLNESS:  Discussed the use of AI scribe software for clinical note transcription with the patient, who gave verbal consent to proceed.  Nicole Crosby is a 58 year old female with past medical history of GERD, hyperlipidemia, hypertension, anxiety who presents with memory concerns and headaches.  She has experienced worsening memory issues over the past year, particularly after having COVID. 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 She also repeats herself to patients, and family members have noticed her repeating things. Cognitive testing in December 2017 was normal. She attributes some of her memory issues to stress. No recent accidents, getting lost while driving, or being late on bills due to forgetfulness. She tries to get seven to eight hours of sleep and denies any history of sleep apnea, stroke, seizures, or traumatic brain injury. She recalls being prescribed Wellbutrin  in the past for relationship-related stress but did not tolerate it well. Her family history is notable for her mother being thought to have dementia before passing away; her mother stopped taking blood pressure medication and refused imaging tests.  She experiences headaches three to four days a week, typically starting around 10 AM at work. The pain is located over her right eye and sometimes at the back of her head, affecting her mouth and causing a sensation of wanting to grind her teeth. Tylenol and ibuprofen do not alleviate the pain, but heat application  and rest sometimes help. She started wearing Invisalign earlier this year, which coincided with the onset of her headaches. Caffeine consumption seems to help with the headaches. She works as a CT technician and reports a stressful work environment.      TBI:  No past history of TBI Stroke: no past history of stroke Seizures:  no past history of seizures Sleep:  no history of sleep apnea.   Mood:  patient denies anxiety and depression Family history of Dementia: Mother   Functional status: independent in all ADLs and IADLs Patient lives with family. Cooking: No issues Cleaning: No issues Shopping: No issues Bathing: No issues Toileting: No issues Driving: Denies any loss in familiar places, no recent accident Bills: No issues Medications: No issues Ever left the stove on by accident?:  Denies Forget how to use items around the house?:  Denies Getting lost going to familiar places?:  Denies Forgetting loved ones names?:  Denies Word finding difficulty?  Yes Sleep: Good   OTHER MEDICAL CONDITIONS: Hypertension, GERD, anxiety   REVIEW OF SYSTEMS: Full 14 system review of systems performed and negative with exception of: As noted in HPI  ALLERGIES: Allergies  Allergen Reactions   Buspirone  Other (See Comments)     nightmare   Esomeprazole  Other (See Comments)    headache   Ezetimibe      Abdominal pain, diarrhea   Lansoprazole      Abdominal pain   Omeprazole      Headache   Proton Pump Inhibitors    Sulfonamide Derivatives Rash     Rash    HOME MEDICATIONS:  Outpatient Medications Prior to Visit  Medication Sig Dispense Refill   Cephalexin  500 MG tablet      clindamycin  (CLINDAGEL ) 1 % gel Apply 1 Application topically as needed.     famotidine  (PEPCID ) 20 MG tablet TAKE 1 TABLET BY MOUTH TWICE DAILY 60 tablet 5   irbesartan  (AVAPRO ) 150 MG tablet Take 1 tablet (150 mg total) by mouth daily. 90 tablet 3   meclizine (ANTIVERT) 25 MG tablet Take 25 mg by mouth 2  (two) times daily as needed for dizziness.     methocarbamol (ROBAXIN) 500 MG tablet Take 1 tablet (500 mg total) by mouth 3 (three) times daily. 30 tablet 0   Multiple Vitamin (MULTIVITAMIN ADULT PO) Take by mouth.     OSCIMIN  0.125 MG SUBL PLACE 1 TABLET (0.125 MG TOTAL) UNDER THE TONGUE EVERY 4 (FOUR) HOURS AS NEEDED. 60 tablet 0   RETIN-A 0.025 % cream      triamcinolone  cream (KENALOG ) 0.1 % Apply 1 Application topically 2 (two) times daily. 30 g 1   cholecalciferol (VITAMIN D ) 1000 UNITS tablet Take 2,000 Units by mouth daily. (Patient not taking: Reported on 10/04/2024)     No facility-administered medications prior to visit.    PAST MEDICAL HISTORY: Past Medical History:  Diagnosis Date   Abnormal CT scan of heart 07/12/2024   Age-related osteoporosis without current pathological fracture 07/12/2024   Allergy    Buspirone , Sulfa, Pantoprazole    Benign fundic gland polyps of stomach 02/12/2016   Cervical spondylosis without myelopathy 07/12/2024   Chicken pox    Dizziness    Eczema    Family history of osteoporosis 07/12/2024   Functional dyspepsia    GERD (gastroesophageal reflux disease)    H/O adenomatous polyp of colon 10/04/2014   09/2014 - 3 mm adenoma - repeat colonoscopy 2022   History of basal cell carcinoma (BCC) 07/12/2024   Hyperlipidemia    no meds   Hypertension    pt denies   IBS (irritable bowel syndrome)    Insomnia    MEDIAL EPICONDYLITIS, RIGHT 07/17/2010   Nasal turbinate hypertrophy 09/08/2022   Nonulcer dyspepsia 02/12/2016   Other abnormal glucose    Tiny hypodensity left hepatic lobe-CT of abdomen and pelvis 07/26/2008-small 8mm periumblical hernia   PAC (premature atrial contraction)    Pain in right elbow 08/31/2018   Palpitations    Supraventricular premature beats 07/12/2024   Tinnitus, bilateral 09/21/2018    PAST SURGICAL HISTORY: Past Surgical History:  Procedure Laterality Date   CESAREAN SECTION  01/2001   COLONOSCOPY     DE  QUERVAIN'S RELEASE  2000   Right wrist area   ESOPHAGOGASTRODUODENOSCOPY     EYE SURGERY  ?2018   Right eye to relieve pressure    FAMILY HISTORY: Family History  Problem Relation Age of Onset   Hypertension Mother    Hyperlipidemia Mother    Fibrocystic breast disease Mother    Hearing loss Mother    Osteopenia Mother    Dementia Mother    Stroke Mother    Prostate cancer Father 72       deceased   Hypertension Father    Diabetes Father    Kidney disease Father    Heart disease Father        MI   Hyperlipidemia Father    Cancer Father    Thyroid  disease Sister    Diabetes Sister        pre-diabetes   Hypertension Sister    Arthritis  Sister    Hyperlipidemia Sister    Alcohol abuse Sister    Diabetes Sister    Hypertension Sister    Hyperlipidemia Sister    Fibrocystic breast disease Sister    Neuropathy Sister    Hyperlipidemia Sister    Hypertension Sister    Other Brother        unknown   Colon cancer Maternal Uncle        died in late 52's   Diabetes Maternal Grandmother    Prostate cancer Maternal Grandfather    COPD Maternal Grandfather    Diabetes Paternal Grandmother    Rectal cancer Neg Hx    Stomach cancer Neg Hx    Esophageal cancer Neg Hx    Pancreatic cancer Neg Hx     SOCIAL HISTORY: Social History   Socioeconomic History   Marital status: Married    Spouse name: Not on file   Number of children: 2   Years of education: Not on file   Highest education level: Associate degree: occupational, scientist, product/process development, or vocational program  Occupational History   Occupation: Xray/CT Theme Park Manager: [employer]  Tobacco Use   Smoking status: Never    Passive exposure: Past   Smokeless tobacco: Never  Vaping Use   Vaping status: Never Used  Substance and Sexual Activity   Alcohol use: Yes    Comment: Occasional   Drug use: No   Sexual activity: Yes    Birth control/protection: Post-menopausal  Other Topics Concern   Not on file  Social  History Narrative   Occupation: Engineer, 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    Alcohol Use - yes-wine         Full Time    Married           Social Drivers of Health   Financial Resource Strain: Patient Declined (08/23/2024)   Overall Financial Resource Strain (CARDIA)    Difficulty of Paying Living Expenses: Patient declined  Food Insecurity: Patient Declined (08/23/2024)   Hunger Vital Sign    Worried About Running Out of Food in the Last Year: Patient declined    Ran Out of Food in the Last Year: Patient declined  Transportation Needs: Patient Declined (08/23/2024)   PRAPARE - Administrator, Civil Service (Medical): Patient declined    Lack of Transportation (Non-Medical): Patient declined  Physical Activity: Unknown (08/23/2024)   Exercise Vital Sign    Days of Exercise per Week: Patient declined    Minutes of Exercise per Session: Not on file  Stress: Patient Declined (08/23/2024)   [employer] of Occupational Health - Occupational Stress Questionnaire    Feeling of Stress: Patient declined  Social Connections: Unknown (08/23/2024)   Social Connection and Isolation Panel    Frequency of Communication with Friends and Family: Patient declined    Frequency of Social Gatherings with Friends and Family: Patient declined    Attends Religious Services: Patient declined    Database Administrator or Organizations: Patient declined    Attends Engineer, Structural: Not on file    Marital Status: Patient declined  Intimate Partner Violence: Not on file    PHYSICAL EXAM  GENERAL EXAM/CONSTITUTIONAL: Vitals:  Vitals:   10/04/24 0940  BP: (!) 140/77  Pulse: 73  SpO2: 97%  Weight: 132 lb (59.9 kg)  Height: 5' 1 (1.549 m)   Body mass index is 24.94 kg/m. Wt Readings from Last 3 Encounters:  10/04/24  132 lb (59.9 kg)  10/02/24 128 lb (58.1 kg)  08/25/24 126 lb (57.2 kg)   Patient is in no distress; well developed, nourished and groomed;  neck is supple  MUSCULOSKELETAL: Gait, strength, tone, movements noted in Neurologic exam below  NEUROLOGIC: MENTAL STATUS:      No data to display            08/28/2016    9:00 AM  Montreal Cognitive Assessment   Visuospatial/ Executive (0/5) 5  Naming (0/3) 3  Attention: Read list of digits (0/2) 2  Attention: Read list of letters (0/1) 1  Attention: Serial 7 subtraction starting at 100 (0/3) 3  Language: Repeat phrase (0/2) 2  Language : Fluency (0/1) 1  Abstraction (0/2) 2  Delayed Recall (0/5) 5  Orientation (0/6) 6  Total 30  Adjusted Score (based on education) 30    awake, alert, oriented to person, place and time recent and remote memory intact normal attention and concentration language fluent, comprehension intact, naming intact fund of knowledge appropriate  CRANIAL NERVE:  2nd, 3rd, 4th, 6th- visual fields full to confrontation, extraocular muscles intact, no nystagmus 5th - facial sensation symmetric 7th - facial strength symmetric 8th - hearing intact 9th - palate elevates symmetrically, uvula midline 11th - shoulder shrug symmetric 12th - tongue protrusion midline  MOTOR:  normal bulk and tone, full strength in the BUE, BLE  SENSORY:  normal and symmetric to light touch  COORDINATION:  finger-nose-finger, fine finger movements normal  GAIT/STATION:  normal   DIAGNOSTIC DATA (LABS, IMAGING, TESTING) - I reviewed patient records, labs, notes, testing and imaging myself where available.  Lab Results  Component Value Date   WBC 5.1 07/12/2024   HGB 13.6 07/12/2024   HCT 40.7 07/12/2024   MCV 91.5 07/12/2024   PLT 301.0 07/12/2024      Component Value Date/Time   NA 138 07/12/2024 0900   NA 142 11/02/2023 1348   K 3.8 07/12/2024 0900   CL 101 07/12/2024 0900   CO2 30 07/12/2024 0900   GLUCOSE 92 07/12/2024 0900   BUN 10 07/12/2024 0900   BUN 10 11/02/2023 1348   CREATININE 0.61 07/12/2024 0900   CREATININE 0.64 05/04/2018 1644    CALCIUM  9.3 07/12/2024 0900   PROT 7.3 07/12/2024 0900   PROT 7.2 10/08/2022 0839   ALBUMIN 4.2 07/12/2024 0900   ALBUMIN 4.5 10/08/2022 0839   AST 17 07/12/2024 0900   ALT 12 07/12/2024 0900   ALKPHOS 72 07/12/2024 0900   BILITOT 0.4 07/12/2024 0900   BILITOT 0.4 10/08/2022 0839   GFRNONAA >60 11/09/2020 0424   GFRAA >60 12/07/2019 0920   Lab Results  Component Value Date   CHOL 189 07/12/2024   HDL 57.10 07/12/2024   LDLCALC 106 (H) 07/12/2024   TRIG 127.0 07/12/2024   CHOLHDL 3 07/12/2024   Lab Results  Component Value Date   HGBA1C 6.1 07/12/2024   Lab Results  Component Value Date   VITAMINB12 >1500 (H) 06/16/2024   Lab Results  Component Value Date   TSH 1.39 07/12/2024    MRI Brain 08/28/2024 1. No acute intracranial abnormality. 2. Minimal nonspecific white matter T2-weighted hyperintensities, possibly related to early chronic small vessel disease or migraine headaches    ASSESSMENT AND PLAN  58 y.o. year old female with    Cognitive symptoms (memory loss) Cognitive symptoms have worsened since 2017, post-COVID, with difficulty remembering names and steps during work. No impact on daily activities. Previous cognitive testing  in 2017 was normal. Family history of dementia, possibly vascular. No current evidence of dementia or mild cognitive impairment. Stress and life circumstances may contribute to symptoms. Alzheimer's disease is a clinical diagnosis, and current symptoms do not meet criteria for dementia. Blood test for Alzheimer's biomarkers is not recommended due to potential stress from positive results without clinical symptoms. - Referral to neuropsychiatrist for further evaluation and comparison with previous cognitive testing done in 2017 - Advised to monitor cognitive symptoms and report any changes affecting daily activities.  Tension-type and caffeine withdrawal headache Headaches occur 3-4 days a week, often at work, with pain over the right  eye and back of the head. Pain is not relieved by Tylenol or ibuprofen but improves with heat. Possible tension-type headache related to work stress and caffeine withdrawal. Previous MRI showed minimal chronic small vessel disease, not indicative of severe pathology. Caffeine withdrawal headache is a possibility, and conservative management is advised. - Advised to consume caffeine in the morning on workdays to prevent caffeine withdrawal headache. - Recommended trying Excedrin Migraine if caffeine does not help. - Suggested magnesium  glycinate 250 mg as a preventive measure for tension-type headaches. - Instructed to contact via MyChart if headaches persist or worsen.  Chronic small vessel disease of brain MRI shows minimal chronic small vessel disease, likely related to age and possibly migraines. No significant findings indicative of vascular dementia or other severe conditions. Small vessel disease can be associated with hypertension, hyperlipidemia, diabetes, smoking, and age-related atherosclerosis. - Continue to monitor for any new symptoms or changes in condition.     1. Cognitive complaints   2. Tension headache   3. Small vessel disease      Patient Instructions  Continue current medications Consider use of caffeine on workdays Start magnesium  gluconate 250 mg nightly Use of Excedrin as needed for the headaches if caffeine does not help Continue to follow with PCP Referral to neuropsychiatry for formal neuropsychological evaluation Return as needed  There are well-accepted and sensible ways to reduce risk for Alzheimers disease and other degenerative brain disorders .  Exercise Daily Walk A daily 20 minute walk should be part of your routine. Disease related apathy can be a significant roadblock to exercise and the only way to overcome this is to make it a daily routine and perhaps have a reward at the end (something your loved one loves to eat or drink perhaps) or a personal  trainer coming to the home can also be very useful. Most importantly, the patient is much more likely to exercise if the caregiver / spouse does it with him/her. In general a structured, repetitive schedule is best.  General Health: Any diseases which effect your body will effect your brain such as a pneumonia, urinary infection, blood clot, heart attack or stroke. Keep contact with your primary care doctor for regular follow ups.  Sleep. A good nights sleep is healthy for the brain. Seven hours is recommended. If you have insomnia or poor sleep habits we can give you some instructions. If you have sleep apnea wear your mask.  Diet: Eating a heart healthy diet is also a good idea; fish and poultry instead of red meat, nuts (mostly non-peanuts), vegetables, fruits, olive oil or canola oil (instead of butter), minimal salt (use other spices to flavor foods), whole grain rice, bread, cereal and pasta and wine in moderation.Research is now showing that the MIND diet, which is a combination of The Mediterranean diet and the DASH diet, is beneficial  for cognitive processing and longevity. Information about this diet can be found in The MIND Diet, a book by Annitta Feeling, MS, RDN, and online at wildwildscience.es  Finances, Power of [address] and Advance Directives: You should consider putting legal safeguards in place with regard to financial and medical decision making. While the spouse always has power of attorney for medical and financial issues in the absence of any form, you should consider what you want in case the spouse / caregiver is no longer around or capable of making decisions.   No orders of the defined types were placed in this encounter.   No orders of the defined types were placed in this encounter.   Return if symptoms worsen or fail to improve.  I personally spent a total of 65 minutes in the care of the patient today including preparing to see the patient,  getting/reviewing separately obtained history, performing a medically appropriate exam/evaluation, counseling and educating, placing orders, referring and communicating with other health care professionals, documenting clinical information in the EHR, independently interpreting results, and communicating results.   Pastor Falling, MD 10/04/2024, 1:01 PM  [facility] [address], [address] [place], [place] [address] [phone]

## 2024-10-04 NOTE — Patient Instructions (Signed)
 Continue current medications Consider use of caffeine on workdays Start magnesium  gluconate 250 mg nightly Use of Excedrin as needed for the headaches if caffeine does not help Continue to follow with PCP Referral to neuropsychiatry for formal neuropsychological evaluation Return as needed  There are well-accepted and sensible ways to reduce risk for Alzheimers disease and other degenerative brain disorders .  Exercise Daily Walk A daily 20 minute walk should be part of your routine. Disease related apathy can be a significant roadblock to exercise and the only way to overcome this is to make it a daily routine and perhaps have a reward at the end (something your loved one loves to eat or drink perhaps) or a personal trainer coming to the home can also be very useful. Most importantly, the patient is much more likely to exercise if the caregiver / spouse does it with him/her. In general a structured, repetitive schedule is best.  General Health: Any diseases which effect your body will effect your brain such as a pneumonia, urinary infection, blood clot, heart attack or stroke. Keep contact with your primary care doctor for regular follow ups.  Sleep. A good nights sleep is healthy for the brain. Seven hours is recommended. If you have insomnia or poor sleep habits we can give you some instructions. If you have sleep apnea wear your mask.  Diet: Eating a heart healthy diet is also a good idea; fish and poultry instead of red meat, nuts (mostly non-peanuts), vegetables, fruits, olive oil or canola oil (instead of butter), minimal salt (use other spices to flavor foods), whole grain rice, bread, cereal and pasta and wine in moderation.Research is now showing that the MIND diet, which is a combination of The Mediterranean diet and the DASH diet, is beneficial for cognitive processing and longevity. Information about this diet can be found in The MIND Diet, a book by Annitta Feeling, MS, RDN, and online at  wildwildscience.es  Finances, Power of [address] and Advance Directives: You should consider putting legal safeguards in place with regard to financial and medical decision making. While the spouse always has power of attorney for medical and financial issues in the absence of any form, you should consider what you want in case the spouse / caregiver is no longer around or capable of making decisions.

## 2024-10-05 ENCOUNTER — Encounter: Payer: Self-pay | Admitting: Internal Medicine

## 2024-10-05 ENCOUNTER — Ambulatory Visit: Admitting: Internal Medicine

## 2024-10-05 VITALS — BP 130/70 | HR 83 | Ht 61.0 in | Wt 127.0 lb

## 2024-10-05 DIAGNOSIS — K581 Irritable bowel syndrome with constipation: Secondary | ICD-10-CM

## 2024-10-05 DIAGNOSIS — R6881 Early satiety: Secondary | ICD-10-CM | POA: Diagnosis not present

## 2024-10-05 DIAGNOSIS — R1013 Epigastric pain: Secondary | ICD-10-CM

## 2024-10-05 DIAGNOSIS — R6889 Other general symptoms and signs: Secondary | ICD-10-CM | POA: Diagnosis not present

## 2024-10-05 NOTE — Addendum Note (Signed)
 Addended by: Koty Anctil E on: 10/05/2024 01:31 PM   Modules accepted: Orders

## 2024-10-05 NOTE — Progress Notes (Signed)
 Nicole Crosby 58 y.o. [birth year] [id number]  Assessment & Plan:   Encounter Diagnoses  Name Primary?   Irritable bowel syndrome with constipation Yes   Dyspepsia    Early satiety    Change in weight     Functional dyspepsia with decreased appetite, nausea, and weight loss since. Has had similar problems in past, also. 2022 and 2017 EGD's negative. Differential includes gastritis, ulcers, stress, emotional factors, and possible H. pylori infection. No significant findings on CT. No strong indication for upper endoscopy. - Ordered stool test for H. pylori. - Ordered breath test for SIBO. - Communicate results and follow-up plan through MyChart.  Infrequent bowel movements every 2-3 days with difficulty. Previous CT showed significant stool burden. Consideration of pelvic floor dysfunction. - functional rectal exam normal - await SIBO test to determine treatment   Depending upon clinical course could consider repeating EGD but in the context of her over oral history this seems similar to other problems with negative evaluations as above plus recent CT scanning is reassuring.   CC: Nicole Boby CROME, NP-C  Subjective:   Chief Complaint: Decreased appetite, constipation  HPI Discussed the use of AI scribe software for clinical note transcription with the patient, who gave verbal consent to proceed.   Nicole Crosby is a 58 year old female who presents with weight fluctuations and gastrointestinal symptoms.  She has a history of functional dyspepsia and IBS.  EGD and colonoscopy April 2022, unremarkable, she does have fundic gland polyps.  Saw Nicole Zehr PA-C 09/09/2023 with complaints of bloating and rectal pressure.  CT abdomen and pelvis 1223 small periumbilical abdominal wall hernia with fat and a single small bowel loop.  Stable adrenal adenoma.  Also had a CT scan October 2025 hernia not reported otherwise unchanged.  FODMAP diet was recommended but was too difficult to  adhere to.  She was complaining of epigastric pain in October, to PCP.  Weight fluctuations and unintentional weight loss - Weight has fluctuated between 127 and 132 pounds over the past year. - Discrepancy of five pounds between home and office weights. - Unintentional weight loss of nine pounds between July and September. - Long-standing difficulty losing weight, but recent loss is concerning.  Decreased appetite and early satiety - Decreased appetite since December 2023. - Early satiety, unable to finish a whole sandwich. - Typically consumes one small meal and a couple of snacks on weekends due to lack of appetite.  Nausea - Significant nausea since December 2023. - Nausea occurs after eating.  Altered bowel habits and constipation - Bowel movements every two to three days, previously daily. - Stools are sometimes small and difficult to pass. - Requires shifting pelvis to have a bowel movement. - Recent CT scan showed a large amount of stool in the colon. - Benefiber trial last year without significant improvement; restarted this week but only taking once daily instead of recommended three times. - Recent GYN exam NL (Nicole Crosby)  Gastroesophageal reflux symptoms - History of acid reflux with burning in the throat after acidic foods. - Reduction in acid reflux symptoms this year.  Abdominal and flank pain - Left flank pain for the past two to three weeks. - Recent CT scan for flank pain showed no kidney stones, increased stool burden was described by radioogist  Urinary symptoms and hematuria - Blood in urine found at urgent care visit; micro hematuria present since 2012 per patient - No urinary urgency or significant leakage. - No difficulty emptying  bladder or controlling urination.      Saw neurology yesterday due to memory disturbance.  MR brain nonspecific minimal white matter T2 weighted hyperintensities.  Patient relates memory disturbance to after COVID.  Cognitive  testing 2017 normal.  Frequent headaches at work.  Neurology diagnosed tension type and caffeine withdrawal headache.  She was referred to neuropsychiatry for formal neuropsychological evaluation.  Wt Readings from Last 3 Encounters:  10/05/24 127 lb (57.6 kg)  10/04/24 132 lb (59.9 kg)  10/02/24 128 lb (58.1 kg)  5/25 132# Weights going back to 2009 with a high of 140 pounds and low of 127 pounds overall they have fluctuated she was at 129 pounds in 2022   Colonoscopy 2022 - Anal papilla(e) were hypertrophied. - The examined portion of the ileum was normal. - The examination was otherwise normal on direct and retroflexion views. - No specimens collected. - Personal history of colonic polyp - diminutive adenoma 2015.  Lab Results  Component Value Date   WBC 5.1 07/12/2024   HGB 13.6 07/12/2024   HCT 40.7 07/12/2024   MCV 91.5 07/12/2024   PLT 301.0 07/12/2024   Lab Results  Component Value Date   NA 138 07/12/2024   CL 101 07/12/2024   K 3.8 07/12/2024   CO2 30 07/12/2024   BUN 10 07/12/2024   CREATININE 0.61 07/12/2024   GFR 97.08 07/12/2024   CALCIUM  9.3 07/12/2024   ALBUMIN 4.2 07/12/2024   GLUCOSE 92 07/12/2024   Lab Results  Component Value Date   ALT 12 07/12/2024   AST 17 07/12/2024   ALKPHOS 72 07/12/2024   BILITOT 0.4 07/12/2024    Allergies  Allergen Reactions   Buspirone  Other (See Comments)     nightmare   Esomeprazole  Other (See Comments)    headache   Ezetimibe      Abdominal pain, diarrhea   Lansoprazole      Abdominal pain   Omeprazole      Headache   Proton Pump Inhibitors    Sulfonamide Derivatives Rash     Rash   Current Meds  Medication Sig   Cephalexin  500 MG tablet    clindamycin  (CLINDAGEL ) 1 % gel Apply 1 Application topically as needed.   famotidine  (PEPCID ) 20 MG tablet TAKE 1 TABLET BY MOUTH TWICE DAILY   irbesartan  (AVAPRO ) 150 MG tablet Take 1 tablet (150 mg total) by mouth daily.   meclizine (ANTIVERT) 25 MG tablet Take 25 mg  by mouth 2 (two) times daily as needed for dizziness.   methocarbamol (ROBAXIN) 500 MG tablet Take 1 tablet (500 mg total) by mouth 3 (three) times daily.   Multiple Vitamin (MULTIVITAMIN ADULT PO) Take by mouth.   OSCIMIN  0.125 MG SUBL PLACE 1 TABLET (0.125 MG TOTAL) UNDER THE TONGUE EVERY 4 (FOUR) HOURS AS NEEDED.   RETIN-A 0.025 % cream    triamcinolone  cream (KENALOG ) 0.1 % Apply 1 Application topically 2 (two) times daily.   Past Medical History:  Diagnosis Date   Abnormal CT scan of heart 07/12/2024   Age-related osteoporosis without current pathological fracture 07/12/2024   Allergy    Buspirone , Sulfa, Pantoprazole    Benign fundic gland polyps of stomach 02/12/2016   Cervical spondylosis without myelopathy 07/12/2024   Chicken pox    Dizziness    Eczema    Family history of osteoporosis 07/12/2024   Functional dyspepsia    GERD (gastroesophageal reflux disease)    H/O adenomatous polyp of colon 10/04/2014   09/2014 - 3 mm adenoma - repeat  colonoscopy 2022   History of basal cell carcinoma (BCC) 07/12/2024   Hyperlipidemia    no meds   Hypertension    pt denies   IBS (irritable bowel syndrome)    Insomnia    MEDIAL EPICONDYLITIS, RIGHT 07/17/2010   Nasal turbinate hypertrophy 09/08/2022   Nonulcer dyspepsia 02/12/2016   Other abnormal glucose    Tiny hypodensity left hepatic lobe-CT of abdomen and pelvis 07/26/2008-small 8mm periumblical hernia   PAC (premature atrial contraction)    Pain in right elbow 08/31/2018   Palpitations    Supraventricular premature beats 07/12/2024   Tinnitus, bilateral 09/21/2018   Past Surgical History:  Procedure Laterality Date   CESAREAN SECTION  01/2001   COLONOSCOPY     DE QUERVAIN'S RELEASE  2000   Right wrist area   ESOPHAGOGASTRODUODENOSCOPY     EYE SURGERY  ?2018   Right eye to relieve pressure   Social History   Social History Narrative   Occupation: Engineer, 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    Alcohol Use -  yes-wine         Full Time    Married           family history includes Alcohol abuse in her sister; Arthritis in her sister; COPD in her maternal grandfather; Cancer in her father; Colon cancer in her maternal uncle; Dementia in her mother; Diabetes in her father, maternal grandmother, paternal grandmother, sister, and sister; Fibrocystic breast disease in her mother and sister; Hearing loss in her mother; Heart disease in her father; Hyperlipidemia in her father, mother, sister, sister, and sister; Hypertension in her father, mother, sister, sister, and sister; Kidney disease in her father; Neuropathy in her sister; Osteopenia in her mother; Other in her brother; Prostate cancer in her maternal grandfather; Prostate cancer (age of onset: 22) in her father; Stroke in her mother; Thyroid  disease in her sister.   Review of Systems As per HPI  Objective:   Physical Exam BP 130/70   Pulse 83   Ht 5' 1 (1.549 m)   Wt 127 lb (57.6 kg)   LMP 11/15/2018   BMI 24.00 kg/m  Abd soft NT  Patti Jordan, CMA present.  Anoderm inspection revealed no abnormalities Digital exam revealed normal resting tone and voluntary squeeze. No mass or rectocele present. Simulated defecation with valsalva revealed appropriate abdominal contraction and descent.   Data reviewed: As per HPI, I have reviewed imaging, labs, primary care notes urgent care notes.  I spent 43 minutes of time, including in depth chart review, independent review of results as outlined above, communicating results with the patient directly, face-to-face time with the patient, coordinating care, ordering studies and medications as appropriate, and documentation.

## 2024-10-05 NOTE — Patient Instructions (Signed)
 Your provider has requested that you go to the basement level for lab work before leaving today. Press B on the elevator. The lab is located at the first door on the left as you exit the elevator.  Due to recent changes in healthcare laws, you may see the results of your imaging and laboratory studies on MyChart before your provider has had a chance to review them.  We understand that in some cases there may be results that are confusing or concerning to you. Not all laboratory results come back in the same time frame and the provider may be waiting for multiple results in order to interpret others.  Please give us  48 hours in order for your provider to thoroughly review all the results before contacting the office for clarification of your results.   You have been given a testing kit to check for small intestine bacterial overgrowth (SIBO) which is completed by a company named [facility]. Make sure to return your test in the mail using the return mailing label given to you along with the kit. The test order, your demographic and insurance information have all already been sent to the company. [facility] will collect an upfront charge of $109.00 for commercial insurance plans and $229.00 if you are paying cash. The potential remaining total after claim submission and review is $120.00. Make sure to discuss with [facility] PRIOR to having the test to see if they have gotten information from your insurance company as to how much your testing will cost out of pocket, if any. Please contact [facility] at phone number [phone] to get instructions regarding how to perform the test as our office is unable to give specific testing instructions.   I appreciate the opportunity to care for you. Lupita Commander, MD, [facility]

## 2024-10-06 ENCOUNTER — Ambulatory Visit

## 2024-10-11 ENCOUNTER — Ambulatory Visit: Payer: Self-pay

## 2024-10-11 ENCOUNTER — Ambulatory Visit

## 2024-10-11 ENCOUNTER — Other Ambulatory Visit

## 2024-10-11 ENCOUNTER — Ambulatory Visit: Admitting: Family Medicine

## 2024-10-11 ENCOUNTER — Encounter: Payer: Self-pay | Admitting: Family Medicine

## 2024-10-11 VITALS — BP 124/72 | HR 72 | Temp 98.5°F | Ht 61.0 in | Wt 127.4 lb

## 2024-10-11 VITALS — BP 130/80 | Ht 61.0 in | Wt 127.0 lb

## 2024-10-11 DIAGNOSIS — M25879 Other specified joint disorders, unspecified ankle and foot: Secondary | ICD-10-CM

## 2024-10-11 DIAGNOSIS — R6881 Early satiety: Secondary | ICD-10-CM | POA: Diagnosis not present

## 2024-10-11 DIAGNOSIS — K581 Irritable bowel syndrome with constipation: Secondary | ICD-10-CM | POA: Diagnosis not present

## 2024-10-11 DIAGNOSIS — M25572 Pain in left ankle and joints of left foot: Secondary | ICD-10-CM

## 2024-10-11 DIAGNOSIS — R1013 Epigastric pain: Secondary | ICD-10-CM

## 2024-10-11 DIAGNOSIS — R8290 Unspecified abnormal findings in urine: Secondary | ICD-10-CM

## 2024-10-11 DIAGNOSIS — M25552 Pain in left hip: Secondary | ICD-10-CM | POA: Diagnosis not present

## 2024-10-11 DIAGNOSIS — R10A2 Flank pain, left side: Secondary | ICD-10-CM

## 2024-10-11 LAB — POC URINALSYSI DIPSTICK (AUTOMATED)
Bilirubin, UA: NEGATIVE
Glucose, UA: NEGATIVE
Ketones, UA: NEGATIVE
Leukocytes, UA: NEGATIVE
Nitrite, UA: NEGATIVE
Protein, UA: NEGATIVE
Spec Grav, UA: 1.01 (ref 1.010–1.025)
Urobilinogen, UA: 0.2 U/dL
pH, UA: 6 (ref 5.0–8.0)

## 2024-10-11 NOTE — Progress Notes (Signed)
 Subjective:     Patient ID: Nicole Crosby, female    DOB: February 15, 1966, 58 y.o.   MRN: [id number]  Chief Complaint  Patient presents with   Flank Pain    Lt side flank pain little relief with ibuprofen, reports strong odor started mid November, became cloudy a few weeks ago, as well as nausea no nausea today       Flank Pain Pertinent negatives include no abdominal pain, chest pain, dysuria or fever.    Discussed the use of AI scribe software for clinical note transcription with the patient, who gave verbal consent to proceed.  History of Present Illness [name] is a 58 year old female who presents with malodorous and cloudy urine.  Urinary symptoms - Cloudy, strong-smelling urine since mid-November - Urinary frequency is variable, ranging from not voiding until noon to urinating about every hour - No dysuria - Urinary urgency present  Hematuria history - History of hematuria, typically associated with urinary tract infections - Episodes of hematuria in February and May of last year - No recent urinary tract infections - Previous episodes of trace blood in urine, not considered concerning per urologist   Abdominal symptoms - Occasional abdominal pressure similar to premenstrual discomfort - No true abdominal pain - Undergoing gastrointestinal evaluation, including stool and H. pylori breath tests  Flank pain - Currently taking muscle relaxers and ibuprofen for pain management     Health Maintenance Due  Topic Date Due   HIV Screening  Never done   Hepatitis C Screening  Never done   Pneumococcal Vaccine: 50+ Years (1 of 1 - PCV) Never done    Past Medical History:  Diagnosis Date   Abnormal CT scan of heart 07/12/2024   Age-related osteoporosis without current pathological fracture 07/12/2024   Allergy    Buspirone , Sulfa, Pantoprazole    Benign fundic gland polyps of stomach 02/12/2016   Cervical spondylosis without myelopathy 07/12/2024    Chicken pox    Dizziness    Eczema    Family history of osteoporosis 07/12/2024   Functional dyspepsia    GERD (gastroesophageal reflux disease)    H/O adenomatous polyp of colon 10/04/2014   09/2014 - 3 mm adenoma - repeat colonoscopy 2022   History of basal cell carcinoma (BCC) 07/12/2024   Hyperlipidemia    no meds   Hypertension    pt denies   IBS (irritable bowel syndrome)    Insomnia    MEDIAL EPICONDYLITIS, RIGHT 07/17/2010   Nasal turbinate hypertrophy 09/08/2022   Nonulcer dyspepsia 02/12/2016   Other abnormal glucose    Tiny hypodensity left hepatic lobe-CT of abdomen and pelvis 07/26/2008-small 8mm periumblical hernia   PAC (premature atrial contraction)    Pain in right elbow 08/31/2018   Palpitations    Supraventricular premature beats 07/12/2024   Tinnitus, bilateral 09/21/2018    Past Surgical History:  Procedure Laterality Date   CESAREAN SECTION  01/2001   COLONOSCOPY     DE QUERVAIN'S RELEASE  2000   Right wrist area   ESOPHAGOGASTRODUODENOSCOPY     EYE SURGERY  ?2018   Right eye to relieve pressure    Family History  Problem Relation Age of Onset   Hypertension Mother    Hyperlipidemia Mother    Fibrocystic breast disease Mother    Hearing loss Mother    Osteopenia Mother    Dementia Mother    Stroke Mother    Prostate cancer Father 34  deceased   Hypertension Father    Diabetes Father    Kidney disease Father    Heart disease Father        MI   Hyperlipidemia Father    Cancer Father    Thyroid  disease Sister    Diabetes Sister        pre-diabetes   Hypertension Sister    Arthritis Sister    Hyperlipidemia Sister    Alcohol abuse Sister    Diabetes Sister    Hypertension Sister    Hyperlipidemia Sister    Fibrocystic breast disease Sister    Neuropathy Sister    Hyperlipidemia Sister    Hypertension Sister    Other Brother        unknown   Colon cancer Maternal Uncle        died in late 22's   Diabetes Maternal  Grandmother    Prostate cancer Maternal Grandfather    COPD Maternal Grandfather    Diabetes Paternal Grandmother    Rectal cancer Neg Hx    Stomach cancer Neg Hx    Esophageal cancer Neg Hx    Pancreatic cancer Neg Hx     Social History   Socioeconomic History   Marital status: Married    Spouse name: Not on file   Number of children: 2   Years of education: Not on file   Highest education level: Associate degree: occupational, scientist, product/process development, or vocational program  Occupational History   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    Alcohol Use - yes-wine         Full Time    Married           Social Drivers of Health   Financial Resource Strain: Patient Declined (08/23/2024)   Overall Financial Resource Strain (CARDIA)    Difficulty of Paying Living Expenses: Patient declined  Food Insecurity: Patient Declined (08/23/2024)   Hunger Vital Sign    Worried About Running Out of Food in the Last Year: Patient declined    Ran Out of Food in the Last Year: Patient declined  Transportation Needs: Patient Declined (08/23/2024)   PRAPARE - Administrator, Civil Service (Medical): Patient declined    Lack of Transportation (Non-Medical): Patient declined  Physical Activity: Unknown (08/23/2024)   Exercise Vital Sign    Days of Exercise per Week: Patient declined    Minutes of Exercise per Session: Not on file  Stress: Patient Declined (08/23/2024)   [employer] of Occupational Health - Occupational Stress Questionnaire    Feeling of  Stress: Patient declined  Social Connections: Unknown (08/23/2024)   Social Connection and Isolation Panel    Frequency of Communication with Friends and Family: Patient declined    Frequency of Social Gatherings with Friends and Family: Patient declined    Attends Religious Services: Patient declined    Database Administrator or Organizations: Patient declined    Attends Banker Meetings: Not on file  Marital Status: Patient declined  Intimate Partner Violence: Not on file    Outpatient Medications Prior to Visit  Medication Sig Dispense Refill   bisoprolol  (ZEBETA ) 5 MG tablet Take 5 mg by mouth daily. 1/2 tablet     clindamycin  (CLINDAGEL ) 1 % gel Apply 1 Application topically as needed.     famotidine  (PEPCID ) 20 MG tablet TAKE 1 TABLET BY MOUTH TWICE DAILY (Patient taking differently: Take by mouth 2 (two) times daily. PRN) 60 tablet 5   irbesartan  (AVAPRO ) 150 MG tablet Take 1 tablet (150 mg total) by mouth daily. 90 tablet 3   MAGnesium -Oxide 400 (240 Mg) MG tablet Take 400 mg by mouth daily.     meclizine (ANTIVERT) 25 MG tablet Take 25 mg by mouth 2 (two) times daily as needed for dizziness.     methocarbamol  (ROBAXIN ) 500 MG tablet Take 1 tablet (500 mg total) by mouth 3 (three) times daily. (Patient taking differently: Take 500 mg by mouth 3 (three) times daily. Taking once a day) 30 tablet 0   Multiple Vitamin (MULTIVITAMIN ADULT PO) Take by mouth.     OSCIMIN  0.125 MG SUBL PLACE 1 TABLET (0.125 MG TOTAL) UNDER THE TONGUE EVERY 4 (FOUR) HOURS AS NEEDED. (Patient taking differently: PRN) 60 tablet 0   RETIN-A 0.025 % cream      Cephalexin  500 MG tablet  (Patient not taking: Reported on 10/11/2024)     triamcinolone  cream (KENALOG ) 0.1 % Apply 1 Application topically 2 (two) times daily. (Patient not taking: Reported on 10/11/2024) 30 g 1   No facility-administered medications prior to visit.    Allergies  Allergen Reactions   Buspirone  Other (See Comments)      nightmare   Esomeprazole  Other (See Comments)    headache   Ezetimibe      Abdominal pain, diarrhea   Lansoprazole      Abdominal pain   Omeprazole      Headache   Proton Pump Inhibitors    Sulfonamide Derivatives Rash     Rash    Review of Systems  Constitutional:  Negative for chills and fever.  Cardiovascular:  Negative for chest pain and palpitations.  Gastrointestinal:  Positive for nausea. Negative for abdominal pain, constipation, diarrhea and vomiting.  Genitourinary:  Positive for flank pain and urgency. Negative for dysuria and frequency.  Neurological:  Negative for dizziness and focal weakness.       Objective:    Physical Exam Constitutional:      General: She is not in acute distress.    Appearance: She is not ill-appearing.  Eyes:     Extraocular Movements: Extraocular movements intact.     Conjunctiva/sclera: Conjunctivae normal.  Cardiovascular:     Rate and Rhythm: Normal rate.  Pulmonary:     Effort: Pulmonary effort is normal.  Abdominal:     Tenderness: There is no right CVA tenderness or left CVA tenderness.  Musculoskeletal:     Cervical back: Normal range of motion and neck supple.  Skin:    General: Skin is warm and dry.  Neurological:     General: No focal deficit present.     Mental Status: She is alert and oriented to person, place, and time.  Psychiatric:        Mood and Affect: Mood normal.        Behavior: Behavior normal.        Thought Content: Thought content normal.      BP 124/72 (BP Location: Left Arm, Patient Position: Sitting)  Pulse 72   Temp 98.5 F (36.9 C) (Temporal)   Ht 5' 1 (1.549 m)   Wt 127 lb 6.4 oz (57.8 kg) Comment: verbal  LMP 11/15/2018   SpO2 95%   BMI 24.07 kg/m  Wt Readings from Last 3 Encounters:  10/11/24 127 lb 6.4 oz (57.8 kg)  10/11/24 127 lb (57.6 kg)  10/05/24 127 lb (57.6 kg)       Assessment & Plan:   Problem List Items Addressed This Visit   None Visit Diagnoses       Left  flank pain    -  Primary     Cloudy urine       Relevant Orders   POCT Urinalysis Dipstick (Automated) (Completed)   Urine Culture     Malodorous urine       Relevant Orders   POCT Urinalysis Dipstick (Automated) (Completed)   Urine Culture       Assessment and Plan Assessment & Plan Abnormal urine findings Malodorous and cloudy urine since mid-November with trace hematuria. Previous UTIs with hematuria.  Awaiting urine culture results to guide treatment. - Sent urine for culture - continue good hydration  - If culture is negative and symptoms persist, will refer to urologist Dr. Alvaro  Left flank pain Intermittent left flank pain with no evidence of nephrolithiasis on previous scan. Pain may be musculoskeletal. No abdominal pain, but some abdominal pressure noted. NSAIDs recommended for pain management. - Recommended NSAIDs for pain management - If pain persists or worsens, will consider further evaluation     I am having Karmina B. Funk maintain her meclizine, Oscimin , clindamycin , Multiple Vitamin (MULTIVITAMIN ADULT PO), famotidine , triamcinolone  cream, Retin-A, irbesartan , methocarbamol , Cephalexin , magnesium  oxide, and bisoprolol .  No orders of the defined types were placed in this encounter.

## 2024-10-11 NOTE — Progress Notes (Signed)
 Subjective:    Patient ID: [name], female    DOB: 58 y.o., 10-19-1966   MRN: [id number]  Chief Complaint: Left ankle and left hip pain.  Discussed the use of AI scribe software for clinical note transcription with the patient, who gave verbal consent to proceed.  History of Present Illness Nicole Crosby is a 58 year old female who presents for evaluation of chronic ankle, hip, and back pain.  Chronic left ankle pain - Persistent since 2017. - Intermittent pain with morning stiffness. - Posterior and lateral ankle tenderness. - Pain affects walking, causing her to change stride and speed to avoid falling. - No formal physical therapy completed. - Uses acetaminophen and naproxen sparingly due to concern for side effects.  Chronic left hip pain - Onset after childbirth 26 years ago, previously diagnosed as bursitis. - Steroid injections (approximately three times) provided more than 6 months of relief each time. - Pain worsens with lying on the left side or prolonged sitting, such as during long drives. - MRI in 2012 reportedly showed gluteal tendon inflammation. - Hip x-rays obtained within the past year. - No history of hip surgery or major hip injuries.  Upper back pain - Aching pain, worse with walking. - Alters gait to avoid falls. - Occupational activities include pushing stretchers and wheelchairs, which she manages by using them for support while walking.  Rheumatologic evaluation - High rheumatoid factor identified. - Rheumatology evaluation did not find evidence of rheumatoid arthritis.     Objective:   Vitals:   10/11/24 1011  BP: 130/80    Left hip (compared to normal) -Inspection: No swelling or skin changes. No leg length discrepancy. No gait abnormalities with walking. -Palpation: TTP - level ASIS, - level AIIS, - adductor, + greater trochanter, + SI joint, - over piriformis, + over gluteal tendons -AROM/PROM: Flexion  120 deg, abduction  50  deg, adduction 30   deg, extension 15 deg, ER 65 deg, IR 35 deg, normal hamstring flexibility -Strength: 5/5 flexion, 5/5 abduction, 5/5 adduction, 5/5 extension, wobbling and prominent medial motion of the knee with single-leg squat -Special tests: +  FADIR (localizes to groin), -  log roll, - Thomas, - Ober, - Noble, - FABER, - piriformis testing, - SLR, - hop test  Left ankle ( compared to normal ) Inspection: no swelling, normal longitudinal arches without significant collapse. Normal transverse arches without significant collapse. Palpation: TTP - ATFL, - CFL, - PTFL, equivocal TTP at anterior joint line, - navicular, - base of 5th metatarsal, - deltoid, -  tarsal, - metatarsal, - achilles, - plantar fascia, + peroneals AROM/PROM: Full plantarflexion, dorsiflexion, eversion, inversion. FROM of the great toe Strength: 5/5 plantarflexion, 5/5 dorsiflexion, 5/5 eversion, 5/5 inversion Special tests: - talar tilt test, - anterior drawer (though this produces a click near posterior ankle which is painful), - proximal squeeze, - calcaneal squeeze, - [place] ankle rules  Limited ultrasound evaluation of the left lower extremity: Gluteus medius tendon is well-visualized and appears to have a hyper echogenic and heterogeneous echotexture. Gluteus minimus tendon is well-visualized and appears to have hypoechogenic foci near its tendinous insertion with mildly increased Doppler uptake. The posterior tibialis tendon is well-visualized at the left ankle and appears normal. The peroneus longus tendon is well-visualized and appears to have peritendinous fluid focus at a level just below the lateral malleolus.  The peroneus brevis is well-visualized and appears normal. There appears to be a hypoechogenic pocket of fluid adjacent a bony  prominence in the posterior ankle corresponding to an osteophytic change noted in the posterior talus on x-ray Talar dome well-visualized and appears  normal. Interpretation: Left posterior ankle impingement with prominent posterior talar osteophyte Left peroneus longus tenosynovitis Left gluteus medius tendinosis Left gluteus minimus tendinosis with small partial tear    Assessment & Plan:   Assessment & Plan Left posterior ankle impingement with peroneal tendon inflammation   Chronic left posterior ankle impingement with peroneal tendon inflammation causes pain during walking and plantar flexion, with tenderness in the posterior lateral joint space. Ultrasound shows fluid collection and disorganization in the peroneal tendons, and a bony prominence suggests impingement. X-ray confirms a talus tail causing impingement. Differential diagnosis includes osteophytic changes and possible os trigonum. Referred to physical therapy for targeted exercises to address peroneal tendons and improve ankle stability. Consider steroid injection into the subtalar joint if pain persists despite physical therapy. Continue home exercises for ankle stabilization.  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 Ultrasound shows tendinopathy and chronic inflammation of the gluteus minimus and medius tendons, with an enthesophyte indicating chronic tension. X-ray reveals an enthesophyte in the region of the gluteal tendon insertion. Will initiate shockwave therapy for the left hip, with treatments once a week for 3-5 sessions. Referred to physical therapy for exercises targeting gluteal tendons and strengthening. Consider steroid injection if pain persists despite shockwave therapy and physical therapy.   Greater than 40 minutes were spent in time reviewing prior imaging, discussing with patient the history and onset of her symptoms, discussing the nature of her diagnoses, and discussing treatment options.

## 2024-10-12 ENCOUNTER — Ambulatory Visit: Payer: Self-pay

## 2024-10-12 ENCOUNTER — Other Ambulatory Visit: Payer: Self-pay | Admitting: Cardiology

## 2024-10-12 DIAGNOSIS — I10 Essential (primary) hypertension: Secondary | ICD-10-CM

## 2024-10-12 DIAGNOSIS — M25552 Pain in left hip: Secondary | ICD-10-CM

## 2024-10-12 LAB — HELICOBACTER PYLORI  SPECIAL ANTIGEN
MICRO NUMBER:: 17338049
SPECIMEN QUALITY: ADEQUATE

## 2024-10-12 LAB — URINE CULTURE: Result:: NO GROWTH

## 2024-10-12 NOTE — Progress Notes (Signed)
° °  Subjective:    Patient ID: CINA KLUMPP, female    DOB: 58 y.o., 1966-01-19   MRN: [id number]  Chief Complaint: Left greater trochanteric pain syndrome - ESWT treatment #1  History of Present Illness Symptoms are unchanged from her visit with myself yesterday Presents today for ESWT treatment #1 for her left GTPS     Objective:   There were no vitals filed for this visit.  Extracorporeal Shockwave Therapy Procedure Following the description of risks including pain, bruising, local skin irritation, damage to surrounding structures, patient provided verbal/written consent for ESWT procedure. Palpation was used to identify the left greater trochanter. Patient and probe was sterilely prepped in the usual fashion with alcohol.  Total strikes: 2000 Intensity: 40 at greater troch, 60 in muscle. Frequency: 12  Patient tolerated well without complication. Precautions provided.      Assessment & Plan:   Assessment & Plan Patient tolerated procedure well. Provided with home exercises for hip. Plan to still start attending PT visits. Follow-up in 1 week for ESWT treatment #2

## 2024-10-13 ENCOUNTER — Other Ambulatory Visit (HOSPITAL_BASED_OUTPATIENT_CLINIC_OR_DEPARTMENT_OTHER): Payer: Self-pay

## 2024-10-13 ENCOUNTER — Ambulatory Visit: Payer: Self-pay | Admitting: Family Medicine

## 2024-10-13 MED ORDER — IRBESARTAN 150 MG PO TABS
150.0000 mg | ORAL_TABLET | Freq: Every day | ORAL | 3 refills | Status: DC
  Filled 2024-10-13: qty 90, 90d supply, fill #0

## 2024-10-16 ENCOUNTER — Ambulatory Visit: Payer: Self-pay | Admitting: Internal Medicine

## 2024-10-17 ENCOUNTER — Other Ambulatory Visit (HOSPITAL_BASED_OUTPATIENT_CLINIC_OR_DEPARTMENT_OTHER): Payer: Self-pay

## 2024-10-17 DIAGNOSIS — D225 Melanocytic nevi of trunk: Secondary | ICD-10-CM | POA: Diagnosis not present

## 2024-10-17 DIAGNOSIS — Z08 Encounter for follow-up examination after completed treatment for malignant neoplasm: Secondary | ICD-10-CM | POA: Diagnosis not present

## 2024-10-17 DIAGNOSIS — Z85828 Personal history of other malignant neoplasm of skin: Secondary | ICD-10-CM | POA: Diagnosis not present

## 2024-10-17 DIAGNOSIS — W908XXS Exposure to other nonionizing radiation, sequela: Secondary | ICD-10-CM | POA: Diagnosis not present

## 2024-10-17 DIAGNOSIS — L578 Other skin changes due to chronic exposure to nonionizing radiation: Secondary | ICD-10-CM | POA: Diagnosis not present

## 2024-10-17 DIAGNOSIS — L821 Other seborrheic keratosis: Secondary | ICD-10-CM | POA: Diagnosis not present

## 2024-10-17 DIAGNOSIS — L814 Other melanin hyperpigmentation: Secondary | ICD-10-CM | POA: Diagnosis not present

## 2024-10-17 DIAGNOSIS — D2271 Melanocytic nevi of right lower limb, including hip: Secondary | ICD-10-CM | POA: Diagnosis not present

## 2024-10-17 DIAGNOSIS — D2262 Melanocytic nevi of left upper limb, including shoulder: Secondary | ICD-10-CM | POA: Diagnosis not present

## 2024-10-17 DIAGNOSIS — K130 Diseases of lips: Secondary | ICD-10-CM | POA: Diagnosis not present

## 2024-10-17 MED ORDER — DESONIDE 0.05 % EX OINT
TOPICAL_OINTMENT | Freq: Two times a day (BID) | CUTANEOUS | 0 refills | Status: AC | PRN
  Filled 2024-10-17: qty 30, 15d supply, fill #0

## 2024-10-18 NOTE — Therapy (Unsigned)
 OUTPATIENT PHYSICAL THERAPY LOWER EXTREMITY EVALUATION   Patient Name: Nicole Crosby MRN: [id number] DOB:Aug 26, 1966, 58 y.o., female Today's Date: 10/18/2024  END OF SESSION:   Past Medical History:  Diagnosis Date   Abnormal CT scan of heart 07/12/2024   Age-related osteoporosis without current pathological fracture 07/12/2024   Allergy    Buspirone , Sulfa, Pantoprazole    Benign fundic gland polyps of stomach 02/12/2016   Cervical spondylosis without myelopathy 07/12/2024   Chicken pox    Dizziness    Eczema    Family history of osteoporosis 07/12/2024   Functional dyspepsia    GERD (gastroesophageal reflux disease)    H/O adenomatous polyp of colon 10/04/2014   09/2014 - 3 mm adenoma - repeat colonoscopy 2022   History of basal cell carcinoma (BCC) 07/12/2024   Hyperlipidemia    no meds   Hypertension    pt denies   IBS (irritable bowel syndrome)    Insomnia    MEDIAL EPICONDYLITIS, RIGHT 07/17/2010   Nasal turbinate hypertrophy 09/08/2022   Nonulcer dyspepsia 02/12/2016   Other abnormal glucose    Tiny hypodensity left hepatic lobe-CT of abdomen and pelvis 07/26/2008-small 8mm periumblical hernia   PAC (premature atrial contraction)    Pain in right elbow 08/31/2018   Palpitations    Supraventricular premature beats 07/12/2024   Tinnitus, bilateral 09/21/2018   Past Surgical History:  Procedure Laterality Date   CESAREAN SECTION  01/2001   COLONOSCOPY     DE QUERVAIN'S RELEASE  2000   Right wrist area   ESOPHAGOGASTRODUODENOSCOPY     EYE SURGERY  ?2018   Right eye to relieve pressure   Patient Active Problem List   Diagnosis Date Noted   Sensorineural hearing loss (SNHL) of right ear with unrestricted hearing of left ear 08/09/2024   Age-related osteoporosis without current pathological fracture 07/12/2024   Atherosclerosis of aorta 07/12/2024   Near syncope 03/17/2024   Bloating 09/09/2023   Rectal pressure 09/09/2023   Left ankle pain 08/11/2023    Incomplete bladder emptying 04/01/2023   Prediabetes 04/01/2023   Hematuria 04/01/2023   Daytime somnolence 04/01/2023   Subacute lumbar radiculopathy 03/16/2023   Basal cell carcinoma 02/10/2021   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   Nausea and vomiting 12/25/2019   Abdominal pain 12/25/2019   Constipation 12/25/2019   Change in bowel habit 12/25/2019   Hypokalemia 09/20/2019   Flexural eczema 05/17/2018   Eyelid dermatitis, allergic/contact 05/17/2018   Medial epicondylitis, right 03/17/2018   Ulnar nerve entrapment at elbow, 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   Vitamin D  deficiency 11/03/2017   Shortened PR interval 09/27/2017   Hx of laser iridotomy 09/02/2017   Greater trochanteric bursitis of left hip 05/12/2017   B12 deficiency 01/22/2017   Memory changes 08/28/2016   Nonulcer dyspepsia 02/12/2016   Benign fundic gland polyps of stomach 02/12/2016   Paresthesia 12/17/2015   Bladder spasm 12/13/2014   H/O adenomatous polyp of colon 10/04/2014   Screening for malignant neoplasm of cervix 05/31/2013   Physical exam 05/31/2013   Left L3 lumbar radiculitis 09/23/2012   Neck pain 08/22/2012   Adrenal nodule 03/02/2012   Essential hypertension, benign 03/02/2012   IBS (irritable bowel syndrome)    PAC (premature atrial contraction)    Insomnia    GERD (gastroesophageal reflux disease)    Palpitations 06/18/2009   Headache 09/21/2008   HEPATIC CYST 08/14/2008   Hyperlipidemia 08/06/2008    PCP: Boby LITTIE Mackintosh; NP-C  REFERRING PROVIDER: Boby LITTIE Mackintosh; NP-C  REFERRING DIAG: L greater  trochanteric pain syndrome; L impingement posterior ankle t  THERAPY DIAG:  No diagnosis found.  Rationale for Evaluation and Treatment: Rehabilitation  ONSET DATE: ***  SUBJECTIVE:   SUBJECTIVE STATEMENT: Patient has had L hip and flank pain for      She received extracorporeal shockwave therapy x 3 with visits 10/12/24 and 10/19/24  PERTINENT HISTORY: Chronic L hip pain onset after  childbirth 26 years ago; diagnosed as bursitis; treated with steroid injections ~ 3 times with > 6 months relief each time; MRI 2012 showed gluteal tendon inflammation PAIN:  Are you having pain? Yes: NPRS scale: *** Pain location: *** Pain description: *** Aggravating factors: *** Relieving factors: ***  PRECAUTIONS: {Therapy precautions:24002}  RED FLAGS: {PT Red Flags:29287}   WEIGHT BEARING RESTRICTIONS: No  FALLS:  Has patient fallen in last 6 months? {fallsyesno:27318}  LIVING ENVIRONMENT: Lives with: lives with their spouse Lives in: House/apartment Stairs: {opstairs:27293} Has following equipment at home: {Assistive devices:23999}  OCCUPATION: ***  PLOF: Independent  PATIENT GOALS: ***  NEXT MD VISIT: 07/19/25  OBJECTIVE:  Note: Objective measures were completed at Evaluation unless otherwise noted.  DIAGNOSTIC FINDINGS: xray L ankle 08/26/23: No tibiotalar or subtalar joint space narrowing was noted.  Inferior and posterior calcaneal spurs were noted.  No erosive changes were noted.   Impression: Unremarkable x-rays of the ankle.   PATIENT SURVEYS:  {rehab surveys:24030}  COGNITION: Overall cognitive status: Within functional limits for tasks assessed     SENSATION: {sensation:27233}  EDEMA:  {edema:24020}  MUSCLE LENGTH: Hamstrings: Right *** deg; Left *** deg Debby test: Right *** deg; Left *** deg  POSTURE: {posture:25561}  PALPATION: ***  LOWER EXTREMITY ROM:  Active ROM Right eval Left eval  Hip flexion    Hip extension    Hip abduction    Hip adduction    Hip internal rotation    Hip external rotation    Knee flexion    Knee extension    Ankle dorsiflexion    Ankle plantarflexion    Ankle inversion    Ankle eversion     (Blank rows = not tested)  LOWER EXTREMITY MMT:  MMT Right eval Left eval  Hip flexion    Hip extension    Hip abduction    Hip adduction    Hip internal rotation    Hip external rotation    Knee  flexion    Knee extension    Ankle dorsiflexion    Ankle plantarflexion    Ankle inversion    Ankle eversion     (Blank rows = not tested)  LOWER EXTREMITY SPECIAL TESTS:  {LEspecialtests:26242}  FUNCTIONAL TESTS:  5 times sit to stand: *** SLS:   GAIT: Distance walked: 40 feet  Assistive device utilized: {Assistive devices:23999} Level of assistance: {Levels of assistance:24026} Comments: ***  TREATMENT DATE: ***    PATIENT EDUCATION:  Education details: POC; HEP  Person educated: Patient Education method: Programmer, Multimedia, Demonstration, Actor cues, Verbal cues, and Handouts Education comprehension: verbalized understanding, returned demonstration, verbal cues required, tactile cues required, and needs further education  HOME EXERCISE PROGRAM: ***  ASSESSMENT:  CLINICAL IMPRESSION: Patient is a 58 y.o. female who was seen today for physical therapy evaluation and treatment for chronic L greater trochanteric pain syndrome.   OBJECTIVE IMPAIRMENTS: {opptimpairments:25111}.   ACTIVITY LIMITATIONS: {activitylimitations:27494}  PARTICIPATION LIMITATIONS: {participationrestrictions:25113}  PERSONAL FACTORS: {Personal factors:25162} are also affecting patient's functional outcome.   REHAB POTENTIAL: Good  CLINICAL DECISION MAKING: Evolving/moderate complexity  EVALUATION COMPLEXITY: Moderate   GOALS: Goals reviewed with patient? Yes  SHORT TERM GOALS: Target date: *** Independent in initial HEP  Baseline: Goal status: INITIAL  2.  *** Baseline:  Goal status: INITIAL  3.  *** Baseline:  Goal status: INITIAL  4.  *** Baseline:  Goal status: INITIAL   LONG TERM GOALS: Target date: ***  *** Baseline:  Goal status: INITIAL  2.  *** Baseline:  Goal status: INITIAL  3.  *** Baseline:  Goal status: INITIAL  4.   *** Baseline:  Goal status: INITIAL  5.  *** Baseline:  Goal status: INITIAL  6.  *** Baseline:  Goal status: INITIAL   PLAN:  PT FREQUENCY: 2x/week  PT DURATION: 8 weeks  PLANNED INTERVENTIONS: 97164- PT Re-evaluation, 97110-Therapeutic exercises, 97530- Therapeutic activity, 97112- Neuromuscular re-education, 97535- Self Care, 02859- Manual therapy, [id number]- Gait training, [id number]- Aquatic Therapy, [id number]- Ionotophoresis 4mg /ml Dexamethasone , 79439 (1-2 muscles), 20561 (3+ muscles)- Dry Needling, Patient/Family education, Balance training, Stair training, Taping, and Joint mobilization  PLAN FOR NEXT SESSION: review and progress with exercises; ergonomic and postural/positional education and recommendations; manual work and modalities as indicated    Trinita Devlin P Tynan Boesel, PT 10/18/2024, 4:45 PM

## 2024-10-18 NOTE — Telephone Encounter (Signed)
 SIBO results placed on Dr Darilyn desk.

## 2024-10-19 ENCOUNTER — Ambulatory Visit: Payer: Self-pay

## 2024-10-19 ENCOUNTER — Ambulatory Visit: Admitting: Family Medicine

## 2024-10-19 DIAGNOSIS — M25552 Pain in left hip: Secondary | ICD-10-CM

## 2024-10-19 NOTE — Progress Notes (Signed)
° °  Subjective:    Patient ID: Nicole Crosby, female    DOB: 58 y.o., [birth year]   MRN: [id number]  Chief Complaint: Left greater trochanteric pain syndrome - ESWT treatment #2  History of Present Illness Symptoms are unchanged thus far Presents today for ESWT treatment #2 for her left GTPS Still having some mild pain in right hip anteriorly     Objective:   There were no vitals filed for this visit.  Extracorporeal Shockwave Therapy Procedure Following the description of risks including pain, bruising, local skin irritation, damage to surrounding structures, patient provided verbal/written consent for ESWT procedure. Palpation was used to identify the left greater trochanter. Patient and probe was sterilely prepped in the usual fashion with alcohol.  Total strikes: 2000 Intensity: 50 at greater troch, 70 in muscle. Frequency: 12  Patient tolerated well without complication. Precautions provided.      Assessment & Plan:   Assessment & Plan Patient tolerated procedure well.  Continue home exercises.  Plan to still start attending PT visits. Follow-up in 1 week for ESWT treatment #2

## 2024-10-23 ENCOUNTER — Encounter: Payer: Self-pay | Admitting: Internal Medicine

## 2024-10-23 ENCOUNTER — Ambulatory Visit: Admitting: Rehabilitative and Restorative Service Providers"

## 2024-10-23 ENCOUNTER — Other Ambulatory Visit: Payer: Self-pay

## 2024-10-23 ENCOUNTER — Other Ambulatory Visit (HOSPITAL_BASED_OUTPATIENT_CLINIC_OR_DEPARTMENT_OTHER): Payer: Self-pay

## 2024-10-23 ENCOUNTER — Other Ambulatory Visit: Payer: Self-pay | Admitting: Internal Medicine

## 2024-10-23 DIAGNOSIS — M6281 Muscle weakness (generalized): Secondary | ICD-10-CM | POA: Diagnosis present

## 2024-10-23 DIAGNOSIS — M25552 Pain in left hip: Secondary | ICD-10-CM | POA: Diagnosis present

## 2024-10-23 DIAGNOSIS — K638211 Small intestinal bacterial overgrowth, hydrogen-subtype: Secondary | ICD-10-CM

## 2024-10-23 DIAGNOSIS — M25879 Other specified joint disorders, unspecified ankle and foot: Secondary | ICD-10-CM | POA: Diagnosis not present

## 2024-10-23 DIAGNOSIS — R29898 Other symptoms and signs involving the musculoskeletal system: Secondary | ICD-10-CM | POA: Diagnosis present

## 2024-10-23 DIAGNOSIS — M25572 Pain in left ankle and joints of left foot: Secondary | ICD-10-CM | POA: Insufficient documentation

## 2024-10-23 HISTORY — DX: Small intestinal bacterial overgrowth, hydrogen-subtype: K63.8211

## 2024-10-23 MED ORDER — HYOSCYAMINE SULFATE SL 0.125 MG SL SUBL
0.1250 mg | SUBLINGUAL_TABLET | SUBLINGUAL | 1 refills | Status: AC | PRN
  Filled 2024-10-23: qty 60, 10d supply, fill #0

## 2024-10-23 MED ORDER — RIFAXIMIN 550 MG PO TABS
550.0000 mg | ORAL_TABLET | Freq: Three times a day (TID) | ORAL | 0 refills | Status: AC
  Filled 2024-10-23 – 2024-10-31 (×2): qty 42, 14d supply, fill #0

## 2024-10-24 ENCOUNTER — Other Ambulatory Visit: Payer: Self-pay | Admitting: Family Medicine

## 2024-10-24 ENCOUNTER — Other Ambulatory Visit: Payer: Self-pay

## 2024-10-24 ENCOUNTER — Other Ambulatory Visit (HOSPITAL_BASED_OUTPATIENT_CLINIC_OR_DEPARTMENT_OTHER): Payer: Self-pay

## 2024-10-24 DIAGNOSIS — K219 Gastro-esophageal reflux disease without esophagitis: Secondary | ICD-10-CM

## 2024-10-24 MED ORDER — FAMOTIDINE 20 MG PO TABS
ORAL_TABLET | Freq: Two times a day (BID) | ORAL | 1 refills | Status: AC
  Filled 2024-10-24: qty 180, 90d supply, fill #0

## 2024-10-25 ENCOUNTER — Ambulatory Visit

## 2024-10-25 ENCOUNTER — Other Ambulatory Visit (HOSPITAL_BASED_OUTPATIENT_CLINIC_OR_DEPARTMENT_OTHER): Payer: Self-pay

## 2024-10-27 ENCOUNTER — Other Ambulatory Visit (HOSPITAL_COMMUNITY): Payer: Self-pay

## 2024-10-27 ENCOUNTER — Telehealth: Payer: Self-pay

## 2024-10-27 NOTE — Telephone Encounter (Signed)
 Pharmacy Patient Advocate Encounter   Received notification from Patient Advice Request messages that prior authorization for Xifaxan  550MG  tablets is required/requested.   Insurance verification completed.   The patient is insured through [facility] [employer].   Per test claim: PA required; PA submitted to above mentioned insurance via Latent Key/confirmation #/EOC [id number] Status is pending

## 2024-10-27 NOTE — Telephone Encounter (Signed)
 PA request has been Submitted. New Encounter has been or will be created for follow up. For additional info see Pharmacy Prior Auth telephone encounter from 10-27-2024.

## 2024-10-28 NOTE — Progress Notes (Unsigned)
 "  Cardiology Office Note   Date:  10/30/2024  ID:  Nicole Crosby, DOB 1966/02/25, MRN [id number] PCP:  Nicole Boby CROME, NP-C  Cardiologist:  Redell Shallow, MD  Electrophysiologist:  None  Chief Complaint: Follow up for HTN  History of Present Illness: .   Nicole Crosby is a 58 y.o. female with visit-pertinent history of palpitations, hypertension and coronary calcification.  Echocardiogram in 2010 revealed normal LV function and no valvular abnormalities.  Stress echo in December 2010 was normal.  CardioNet in 2010 showed sinus to sinus tachycardia with rare PACs.  Stress echo in 04/2014 was normal.  ETT in 09/2018 was negative with adequate exercise.  Cardiac CTA in February 2022 showed minimal nonobstructive CAD and calcium  score of 0.7.  Monitor in December 2022 showed sinus rhythm with occasional PACs, brief PAT and rare PVCs.  Cardiac monitor in May 2025 showed sinus rhythm with 2 short runs of SVT, longest 7 beats and rare PACs and PVCs.  Patient was last seen by Dr. Shallow on 05/01/2024.  Noted that Toprol  caused fatigue and patient discontinued, was started on bisoprolol  2.5 mg nightly.  Today she presents for follow-up.  She reports that she has been doing well overall from a cardiac standpoint however notes concerns regarding her blood pressure.  She reports that she did take bisoprolol  for a time after seeing Dr. Shallow however noted significant fatigue and discontinued medication with improvement however this resulted in increasing blood pressure.  She reports she restarted bisoprolol  in recent weeks with improvement in blood pressure however now has persistent fatigue.  She notes occasional chest tightness described as feeling as though her bra is too tight, notes similar intermittent chest discomfort with reassuring workups.  She reports that her palpitations have been infrequent and nonbothersome even when she was off of bisoprolol .  She denies any increased lower extremity  edema, orthopnea or PND.  She denies any presyncope or syncope.  She walks between 7500 steps and 10,000 steps at work, tolerates well. ROS: .   Today she denies shortness of breath, lower extremity edema, melena, hematuria, hemoptysis, diaphoresis, weakness, presyncope, syncope, orthopnea, and PND.  All other systems are reviewed and otherwise negative. Studies Reviewed: Nicole Crosby   EKG:  EKG is ordered today, personally reviewed, demonstrating  EKG Interpretation Date/Time:  Monday October 30 2024 15:46:46 EST Ventricular Rate:  86 PR Interval:  136 QRS Duration:  68 QT Interval:  374 QTC Calculation: 447 R Axis:   49  Text Interpretation: Normal sinus rhythm Normal ECG When compared with ECG of 18-Aug-2023 11:31, No significant change since last tracing Confirmed by Aqil Goetting [phone]) on 10/30/2024 4:46:37 PM   CV Studies: Cardiac studies reviewed are outlined and summarized above. Otherwise please see EMR for full report. Cardiac Studies & Procedures   ______________________________________________________________________________________________   STRESS TESTS  EXERCISE TOLERANCE TEST (ETT) 09/06/2018  Interpretation Summary  Blood pressure demonstrated a normal response to exercise.  Upsloping ST segment depression ST segment depression was noted during stress in the II, III, aVF, V4, V5 and V6 leads, and returning to baseline after less than 1 minute of recovery.  No T wave inversion was noted during stress.  Excellent exercise capacity.  Negative, adequate stress test.      MONITORS  LONG TERM MONITOR (3-14 DAYS) 03/31/2024  Narrative HR 53 to 169, average 85. 2 nonsustained SVT, longest 7 beats Rare supraventricular and ventricular ectopy. No sustained arrhythmias. No atrial fibrillation.  Nicole T. Cindie, MD,  FACC, FHRS Cardiac Electrophysiology   CT SCANS  CT CORONARY MORPH W/CTA COR W/SCORE 12/26/2020  Addendum 12/26/2020  5:55 PM ADDENDUM REPORT:  12/26/2020 17:52  CLINICAL DATA:  Chest pain  EXAM: Cardiac/Coronary CTA  TECHNIQUE: The patient was scanned on a [employer]. A 110 kV prospective scan was triggered in the descending thoracic aorta at 111 HU's. Axial non-contrast 3 mm slices were carried out through the heart. The data set was analyzed on a dedicated work station and scored using the Agatson method. Gantry rotation speed was 250 msecs and collimation was .6 mm. No beta blockade and 0.8 mg of sl NTG was given. The 3D data set was reconstructed in 5% intervals of the 35-75 % of the R-R cycle. Diastolic phases were analyzed on a dedicated work station using MPR, MIP and VRT modes. The patient received 80 cc of contrast.  FINDINGS: Image quality: excellent.  Noise artifact is: Limited.  Coronary Arteries:  Normal coronary origin.  Right dominance.  Left main: The left main is a large caliber vessel with a normal take off from the left coronary cusp that trifurcates to form a left anterior descending artery, ramus intermedius, and a left circumflex artery. There is no plaque or stenosis.  Left anterior descending artery: The proximal LAD contains minimal calcified plaque (<25%). The LAD gives off 1 patent diagonal branches.  Ramus intermedius: Patent with no evidence of plaque or stenosis.  Left circumflex artery: The LCX is non-dominant and patent with no evidence of plaque or stenosis. The LCX gives off 1 patent obtuse marginal branches.  Right coronary artery: The RCA is dominant with normal take off from the right coronary cusp. There is no evidence of plaque or stenosis. The RCA terminates as a PDA and right posterolateral branch without evidence of plaque or stenosis.  Right Atrium: Right atrial size is within normal limits.  Right Ventricle: The right ventricular cavity is within normal limits.  Left Atrium: Left atrial size is normal in size with no left atrial appendage filling  defect.  Left Ventricle: The ventricular cavity size is within normal limits. There are no stigmata of prior infarction. There is no abnormal filling defect.  Pulmonary arteries: Normal in size without proximal filling defect.  Pulmonary veins: Normal pulmonary venous drainage.  Pericardium: Normal thickness with no significant effusion or calcium  present.  Cardiac valves: The aortic valve is trileaflet without significant calcification. The mitral valve is normal structure without significant calcification.  Aorta: Normal caliber with no significant disease.  Extra-cardiac findings: See attached radiology report for non-cardiac structures.  IMPRESSION: 1. Coronary calcium  score of 0.7. This was 70th percentile for age and sex matched controls.  2. Normal coronary origin with right dominance.  3. Minimal calcified plaque in the proximal LAD (<25%).  RECOMMENDATIONS: 1. Minimal non-obstructive CAD (0-24%). Consider non-atherosclerotic causes of chest pain. Consider preventive therapy and risk factor modification.  Nicole Decent, MD   Electronically Signed By: Nicole Crosby On: 12/26/2020 17:52  Narrative EXAM: OVER-READ INTERPRETATION  CT CHEST  The following report is an over-read performed by radiologist Dr. Toribio Crosby of [facility] on 12/26/2020. This over-read does not include interpretation of cardiac or coronary anatomy or pathology. The coronary calcium  score/coronary CTA interpretation by the cardiologist is attached.  COMPARISON:  None.  FINDINGS: Aortic atherosclerosis. Within the visualized portions of the thorax there are no suspicious appearing pulmonary nodules or masses, there is no acute consolidative airspace disease, no pleural effusions, no pneumothorax  and no lymphadenopathy. Visualized portions of the upper abdomen are unremarkable. There are no aggressive appearing lytic or blastic lesions noted in the visualized  portions of the skeleton.  IMPRESSION: 1.  Aortic Atherosclerosis (ICD10-I70.0).  Electronically Signed: By: Nicole Crosby M.D. On: 12/26/2020 09:29     ______________________________________________________________________________________________       Current Reported Medications:.    Active Medications[1]  Physical Exam:    VS:  BP 137/79 (BP Location: Right Arm)   Pulse 86   Ht 5' 1 (1.549 m)   Wt 134 lb (60.8 kg)   LMP 11/15/2018   SpO2 100%   BMI 25.32 kg/m    Wt Readings from Last 3 Encounters:  10/30/24 134 lb (60.8 kg)  10/11/24 127 lb 6.4 oz (57.8 kg)  10/11/24 127 lb (57.6 kg)    GEN: Well nourished, well developed in no acute distress NECK: No JVD; No carotid bruits CARDIAC: RRR, no murmurs, rubs, gallops RESPIRATORY:  Clear to auscultation without rales, wheezing or rhonchi  ABDOMEN: Soft, non-tender, non-distended EXTREMITIES:  No edema; No acute deformity     Asessement and Plan:.    Palpitations: Patient has noted previous palpitations, cardiac monitor in 10/2021 indicated occasional PACs, brief PAT and rare PVCs.  Repeat monitor worn in May 2025 showed 2 short runs of SVT, longest 7 beats and rare PACs and PVCs.  Patient reports that she has previously not been able to tolerate beta-blockers resulting in significant fatigue, she reports that she did take bisoprolol  for a few months however given increased fatigue discontinue the medication, recently restarted as she notes that her blood pressure started climbing into the 140s and 150s, improved her blood pressure however now has persistent fatigue.  Patient reports that her palpitations have overall been very well-controlled even when she was not taking the bisoprolol .  Through shared decision making elected to discontinue beta-blocker.  She will continue to monitor her symptoms.  Coronary calcification: Coronary CTA in 12/2020 showed minimal nonobstructive CAD and calcium  score of 0.7.  Patient notes  an intermittent chest tightness described as her bra being too tight, she is unsure if this is specifically associated with exertion.  Notes that she regularly walks 7500 steps to 10,000 steps at work and overall tolerates well.  Discussed repeating coronary CTA, patient deferred at this time.  She will continue to monitor her symptoms and notify the office if becomes more persistent or bothersome.  Heart healthy diet and regular cardiovascular exercise encouraged.  Reviewed ED precautions.  Continue irbesartan . Patient previously unable to tolerate Crestor  or Zetia .  Hypertension: Blood pressure today 137/79.  Patient reports that she recently restarted bisoprolol  as her blood pressure was steadily climbing with systolics in the 140s and 150s.  She notes that she did have some improvements with restarting of bisoprolol  however her systolic blood pressure can increase above 130.  Patient notes significant fatigue associated with increased bisoprolol , through shared decision making elected discontinue bisoprolol  and increase irbesartan  to 300 mg daily.  Check basic metabolic profile in 2 weeks.  Encourage patient to continue monitoring her blood pressure at home and bring log to follow-up.   Disposition: F/u with Nicole Pavlich, NP in 8 weeks.   Signed, Nicole Bordner D Kassey Laforest, NP       [1]  Current Meds  Medication Sig   Cephalexin  500 MG tablet  (Patient taking differently: as needed. UTI)   clindamycin  (CLINDAGEL ) 1 % gel Apply 1 Application topically as needed.   desonide  (DESOWEN ) 0.05 %  ointment Apply topically 2 (two) times daily as needed.   famotidine  (PEPCID ) 20 MG tablet Take 1 tablet (20 mg) by mouth twice daily.   Hyoscyamine  Sulfate SL (OSCIMIN ) 0.125 MG SUBL Place 1 tablet (0.125 mg total) under the tongue every 4 (four) hours as needed (Abdominal pain).   MAGnesium -Oxide 400 (240 Mg) MG tablet Take 400 mg by mouth daily.   meclizine (ANTIVERT) 25 MG tablet Take 25 mg by mouth 2 (two) times  daily as needed for dizziness.   methocarbamol  (ROBAXIN ) 500 MG tablet Take 1 tablet (500 mg total) by mouth 3 (three) times daily. (Patient taking differently: Take 500 mg by mouth 3 (three) times daily. Taking once a day)   Multiple Vitamin (MULTIVITAMIN ADULT PO) Take by mouth.   RETIN-A 0.025 % cream    rifaximin  (XIFAXAN ) 550 MG TABS tablet Take 1 tablet (550 mg total) by mouth 3 (three) times daily.   triamcinolone  cream (KENALOG ) 0.1 % Apply 1 Application topically 2 (two) times daily.   [DISCONTINUED] bisoprolol  (ZEBETA ) 5 MG tablet Take 5 mg by mouth daily. 1/2 tablet   [DISCONTINUED] irbesartan  (AVAPRO ) 150 MG tablet Take 1 tablet (150 mg total) by mouth daily.   "

## 2024-10-30 ENCOUNTER — Ambulatory Visit: Attending: Cardiology | Admitting: Cardiology

## 2024-10-30 ENCOUNTER — Other Ambulatory Visit (HOSPITAL_BASED_OUTPATIENT_CLINIC_OR_DEPARTMENT_OTHER): Payer: Self-pay

## 2024-10-30 ENCOUNTER — Encounter: Payer: Self-pay | Admitting: Cardiology

## 2024-10-30 VITALS — BP 137/79 | HR 86 | Ht 61.0 in | Wt 134.0 lb

## 2024-10-30 DIAGNOSIS — Z79899 Other long term (current) drug therapy: Secondary | ICD-10-CM

## 2024-10-30 DIAGNOSIS — R002 Palpitations: Secondary | ICD-10-CM | POA: Diagnosis not present

## 2024-10-30 DIAGNOSIS — I10 Essential (primary) hypertension: Secondary | ICD-10-CM | POA: Diagnosis not present

## 2024-10-30 DIAGNOSIS — R931 Abnormal findings on diagnostic imaging of heart and coronary circulation: Secondary | ICD-10-CM | POA: Diagnosis not present

## 2024-10-30 MED ORDER — IRBESARTAN 300 MG PO TABS
300.0000 mg | ORAL_TABLET | Freq: Every day | ORAL | 11 refills | Status: AC
  Filled 2024-10-30: qty 30, 30d supply, fill #0

## 2024-10-30 NOTE — Patient Instructions (Signed)
 Medication Instructions:  STOP: Bisoprolol   INCREASE: Irbasartan 300 mg (1 tablet) daily *If you need a refill on your cardiac medications before your next appointment, please call your pharmacy*  Lab Work: In 2 weeks: BMET  If you have labs (blood work) drawn today and your tests are completely normal, you will receive your results only by: MyChart Message (if you have MyChart) OR A paper copy in the mail If you have any lab test that is abnormal or we need to change your treatment, we will call you to review the results.  Testing/Procedures: NONE  Follow-Up: At [facility], you and your health needs are our priority.  As part of our continuing mission to provide you with exceptional heart care, our providers are all part of one team.  This team includes your primary Cardiologist (physician) and Advanced Practice Providers or APPs (Physician Assistants and Nurse Practitioners) who all work together to provide you with the care you need, when you need it.  Your next appointment:   8 week(s)  Provider:   Katlyn West, NP

## 2024-10-31 ENCOUNTER — Other Ambulatory Visit (HOSPITAL_BASED_OUTPATIENT_CLINIC_OR_DEPARTMENT_OTHER): Payer: Self-pay

## 2024-10-31 NOTE — Telephone Encounter (Signed)
 Pharmacy Patient Advocate Encounter  Received notification from [facility] that Prior Authorization for Xifaxan  550MG  tablets has been APPROVED from 10-30-2024 to 11-29-2024   PA #/Case ID/Reference #: [id number]

## 2024-10-31 NOTE — Progress Notes (Unsigned)
" ° °  Subjective:    Patient ID: Nicole Crosby, female    DOB: 58 y.o., Mar 26, 1966   MRN: [id number]  Chief Complaint: Left greater trochanteric pain syndrome - ESWT treatment #3  History of Present Illness Symptoms significantly improved after treatment #2, then did significantly worsen after PT visit #1. Presents today for ESWT treatment #3 for her left GTPS     Objective:   There were no vitals filed for this visit.  Extracorporeal Shockwave Therapy Procedure - #3 Following the description of risks including pain, bruising, local skin irritation, damage to surrounding structures, patient provided verbal/written consent for ESWT procedure. Palpation was used to identify the left greater trochanter. Patient and probe was sterilely prepped in the usual fashion with alcohol.  Total strikes: 2000 Intensity: 60 all over. Frequency: 12  Patient tolerated well without complication. Precautions provided.      Assessment & Plan:   Assessment & Plan Patient tolerated procedure well.  Continue home exercises and PT visits. Follow-up in 1 week for ESWT treatment #4    "

## 2024-11-01 ENCOUNTER — Ambulatory Visit: Payer: Self-pay

## 2024-11-01 DIAGNOSIS — M25552 Pain in left hip: Secondary | ICD-10-CM

## 2024-11-03 ENCOUNTER — Ambulatory Visit: Attending: Family Medicine

## 2024-11-03 DIAGNOSIS — M25572 Pain in left ankle and joints of left foot: Secondary | ICD-10-CM | POA: Insufficient documentation

## 2024-11-03 DIAGNOSIS — M6281 Muscle weakness (generalized): Secondary | ICD-10-CM | POA: Diagnosis present

## 2024-11-03 DIAGNOSIS — R29898 Other symptoms and signs involving the musculoskeletal system: Secondary | ICD-10-CM | POA: Insufficient documentation

## 2024-11-03 DIAGNOSIS — M25552 Pain in left hip: Secondary | ICD-10-CM | POA: Diagnosis present

## 2024-11-03 NOTE — Therapy (Signed)
 " OUTPATIENT PHYSICAL THERAPY LOWER EXTREMITY TREATMENT   Patient Name: Nicole Crosby MRN: [id number] DOB:February 09, 1966, 59 y.o., female Today's Date: 11/03/2024  END OF SESSION:  PT End of Session - 11/03/24 0932     Visit Number 2    Number of Visits 16    Date for Recertification  12/18/24    Authorization Type [place] [employer] save plan high deduct    PT Start Time 0932    PT Stop Time 1006   PT had to end session early for family emergency   PT Time Calculation (min) 34 min    Activity Tolerance Patient tolerated treatment well           Past Medical History:  Diagnosis Date   Abnormal CT scan of heart 07/12/2024   Age-related osteoporosis without current pathological fracture 07/12/2024   Allergy    Buspirone , Sulfa, Pantoprazole    Benign fundic gland polyps of stomach 02/12/2016   Cervical spondylosis without myelopathy 07/12/2024   Chicken pox    Dizziness    Eczema    Family history of osteoporosis 07/12/2024   Functional dyspepsia    GERD (gastroesophageal reflux disease)    H/O adenomatous polyp of colon 10/04/2014   09/2014 - 3 mm adenoma - repeat colonoscopy 2022   History of basal cell carcinoma (BCC) 07/12/2024   Hyperlipidemia    no meds   Hypertension    pt denies   IBS (irritable bowel syndrome)    Insomnia    MEDIAL EPICONDYLITIS, RIGHT 07/17/2010   Nasal turbinate hypertrophy 09/08/2022   Nonulcer dyspepsia 02/12/2016   Other abnormal glucose    Tiny hypodensity left hepatic lobe-CT of abdomen and pelvis 07/26/2008-small 8mm periumblical hernia   PAC (premature atrial contraction)    Pain in right elbow 08/31/2018   Palpitations    Small intestinal bacterial overgrowth (SIBO), hydrogen subtype 10/23/2024   Supraventricular premature beats 07/12/2024   Tinnitus, bilateral 09/21/2018   Past Surgical History:  Procedure Laterality Date   CESAREAN SECTION  01/2001   COLONOSCOPY     DE QUERVAIN'S RELEASE  2000   Right wrist area    ESOPHAGOGASTRODUODENOSCOPY     EYE SURGERY  ?2018   Right eye to relieve pressure   Patient Active Problem List   Diagnosis Date Noted   Small intestinal bacterial overgrowth (SIBO), hydrogen subtype 10/23/2024   Sensorineural hearing loss (SNHL) of right ear with unrestricted hearing of left ear 08/09/2024   Age-related osteoporosis without current pathological fracture 07/12/2024   Atherosclerosis of aorta 07/12/2024   Near syncope 03/17/2024   Bloating 09/09/2023   Rectal pressure 09/09/2023   Left ankle pain 08/11/2023   Incomplete bladder emptying 04/01/2023   Prediabetes 04/01/2023   Hematuria 04/01/2023   Daytime somnolence 04/01/2023   Subacute lumbar radiculopathy 03/16/2023   Basal cell carcinoma 02/10/2021   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   Nausea and vomiting 12/25/2019   Abdominal pain 12/25/2019   Constipation 12/25/2019   Change in bowel habit 12/25/2019   Hypokalemia 09/20/2019   Flexural eczema 05/17/2018   Eyelid dermatitis, allergic/contact 05/17/2018   Medial epicondylitis, right 03/17/2018   Ulnar nerve entrapment at elbow, 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   Vitamin D  deficiency 11/03/2017   Shortened PR interval 09/27/2017   Hx of laser iridotomy 09/02/2017   Greater trochanteric bursitis of left hip 05/12/2017   B12 deficiency 01/22/2017   Memory changes 08/28/2016   Nonulcer dyspepsia 02/12/2016   Benign fundic gland polyps of stomach 02/12/2016   Paresthesia  12/17/2015   Bladder spasm 12/13/2014   H/O adenomatous polyp of colon 10/04/2014   Screening for malignant neoplasm of cervix 05/31/2013   Physical exam 05/31/2013   Left L3 lumbar radiculitis 09/23/2012   Neck pain 08/22/2012   Adrenal nodule 03/02/2012   Essential hypertension, benign 03/02/2012   IBS (irritable bowel syndrome)    PAC (premature atrial contraction)    Insomnia    GERD (gastroesophageal reflux disease)    Palpitations 06/18/2009   Headache 09/21/2008   HEPATIC CYST 08/14/2008    Hyperlipidemia 08/06/2008    PCP: Boby LITTIE Mackintosh; NP-C  REFERRING PROVIDER: Boby LITTIE Mackintosh; NP-C  REFERRING DIAG: L greater trochanteric pain syndrome; L impingement posterior ankle t  THERAPY DIAG:  Greater trochanteric pain syndrome of left lower extremity  Pain in left ankle and joints of left foot  Other symptoms and signs involving the musculoskeletal system  Muscle weakness (generalized)  Rationale for Evaluation and Treatment: Rehabilitation  ONSET DATE: worsening symptoms since 04/16/24   SUBJECTIVE:   SUBJECTIVE STATEMENT: Patient reports she is here for one visit just to get exercises for her ankle. Patient reports the pain can fluctuate about the medial and lateral ankle. Isn't sure what she wants to do for treatment of the hip as she feels the initially prescribed exercises made her hurt more.   EVAL: Patient has had L hip and flank pain for for the past 26 years on an intermittent basis. She has had PT and injections; and shockwave therapy. The shock wave therapy has helped to resolve numbness in the L leg and the pain in the L hip has improved.      She received extracorporeal shockwave therapy x 2 with visits 10/12/24 and 10/19/24; scheduled for third treatment next week  Some change in BM in the past 2 years with constipation  Urologist is evaluating for inability to fully empty bladder    L ankle - pain in anterior ankle 2016 with no known injury; seen by MD 2017; seen by sports med MD 2024 with no diagnosis; rheumatology 2024 no findings; Dr Charles us 'ed and saw some fluid in the posterior ankle and a bone spur on the talus    PERTINENT HISTORY: Chronic L hip pain onset after childbirth 26 years ago; diagnosed as bursitis; treated with steroid injections ~ 3 times with > 6 months relief each time; MRI 2012 showed gluteal tendon inflammation PAIN:  Are you having pain? Yes: NPRS scale: 2 Pain location: left lateral ankle Pain description:  stiff Aggravating factors: unknown Relieving factors: rest   PRECAUTIONS: None  RED FLAGS: None   WEIGHT BEARING RESTRICTIONS: No  FALLS:  Has patient fallen in last 6 months? 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  DIAGNOSTIC FINDINGS:  xray L ankle 08/26/23: No tibiotalar or subtalar joint space narrowing was noted.  Inferior and posterior calcaneal spurs were noted.  No erosive changes were noted.   Impression: Unremarkable x-rays of the ankle.   PATIENT SURVEYS:  LEFS - 63/80; 78.8%   COGNITION: Overall cognitive status: Within functional limits for tasks assessed     SENSATION: Occasional tingling and numbness in the lateral thigh to knee   EDEMA:  None   MUSCLE LENGTH: Hamstrings: Right WFL deg; Left WFL's tighter than R  deg Thomas test: Right mild tightness  deg; Left tight  deg  POSTURE: rounded shoulders, forward head, decreased lumbar lordosis, increased thoracic kyphosis, and flexed trunk   PALPATION: Tightness L > R iliacus; psoas; hip adductors; L obturators; piriformis; glut med/min  LUMBAR ROM:   Active  A/PROM  eval  Flexion 90%  Extension 55% discomfort bilat SI   Right lateral flexion 80% pain in R SI  Left lateral flexion 70%  tight   Right rotation 40% discomfort LB  Left rotation 35% discomfort L    (Blank rows = not tested)    LOWER EXTREMITY ROM: WFL's bilat - tight hip piriformis L > R   Active ROM Right eval Left eval  Hip flexion    Hip extension    Hip abduction    Hip adduction    Hip internal rotation    Hip external rotation    Knee flexion    Knee extension    Ankle dorsiflexion    Ankle plantarflexion    Ankle inversion    Ankle eversion     (Blank rows = not tested)  LOWER EXTREMITY MMT:  MMT Right eval Left eval  Hip flexion    Hip extension 4+ 4  Hip abduction 4+ 4  Hip adduction    Hip internal rotation    Hip external rotation    Knee flexion    Knee extension    Ankle dorsiflexion    Ankle plantarflexion    Ankle inversion    Ankle eversion     (Blank rows = not tested)  LOWER EXTREMITY SPECIAL TESTS:  Hip special tests: Belvie (FABER) test: negative, Trendelenburg test: negative, and Thomas test: positive - tightness L > R    FUNCTIONAL TESTS:  5 times sit to stand: 8.46 sec mild pain in LB/SI area  SLS: R 10 sec; L 10 sec   GAIT: Distance walked: 40 feet  Assistive device utilized: None Level of assistance: Complete Independence Comments: abnormal gait with decreased weight bearing and decreased stance phase on L LE    [facility] Adult PT Treatment:                                                DATE: 11/03/24 Therapeutic Exercise: Calf stretch with strap 2 x 30 sec  Seated calf raise with ball squeeze 2 x 10  Reviewed and updated HEP   Neuromuscular re-ed: Resisted ankle inversion red band 2 x 10  Resisted ankle eversion red band 2 x 10  Resisted ankle plantarflexion 2 x 10; blue band  Resisted ankle dorsiflexion blue band 2 x 10  Great toe extension x 10  Toe yoga 2 x 10  Tandem 2 x 30 sec each  SLS 30 sec each      PATIENT EDUCATION:  Education details: HEP  Person  educated: Patient Education method: Explanation, Demonstration, Tactile cues,  Verbal cues, and Handouts Education comprehension: verbalized understanding, returned demonstration, verbal cues required, tactile cues required, and needs further education  HOME EXERCISE PROGRAM: Access Code: [email or web address] URL: [email or web address] Date: 11/03/2024 Prepared by: Lucie Meeter  Exercises - Hip Flexor Stretch at [facility] of Bed  - 2 x daily - 7 x weekly - 1 sets - 3 reps - 30 sec  hold - Standing Piriformis Release with Ball at [employer]  - 2 x daily - 7 x weekly - 30-60 sec  hold - Psoas Mobilization with Small Ball  - 1 x daily - 7 x weekly - 1 sets - 3 reps - 30 sec  hold - Long Sitting Calf Stretch with Strap  - 1 x daily - 7 x weekly - 3 sets - 30 sec  hold - Long Sitting Ankle Inversion with Resistance  - 1 x daily - 7 x weekly - 2 sets - 10 reps - Long Sitting Ankle Eversion with Resistance  - 1 x daily - 7 x weekly - 2 sets - 10 reps - Long Sitting Ankle Plantar Flexion with Resistance  - 1 x daily - 7 x weekly - 2 sets - 10 reps - Long Sitting Ankle Dorsiflexion with Anchored Resistance  - 1 x daily - 7 x weekly - 2 sets - 10 reps - Toe Yoga - Alternating Great Toe and Lesser Toe Extension  - 1 x daily - 7 x weekly - 2 sets - 10 reps - Seated Calf Raise With Small Ball at Heels  - 1 x daily - 7 x weekly - 2 sets - 10 reps - Tandem Stance  - 1 x daily - 7 x weekly - 3 sets - 30 sec  hold - Single Leg Stance  - 1 x daily - 7 x weekly - 3 sets - max hold - Heel Raises with Counter Support  - 1 x daily - 7 x weekly - 2 sets - 10 reps  ASSESSMENT:  CLINICAL IMPRESSION: Patient states she is only here for 1 visit to obtain HEP for her ankle. We focused on creating home program to include ankle stretching, strengthening, and balance activity with good tolerance. Unsure if patient plans to continue with PT at this time.   EVAL: Patient is a 59 y.o. female who was seen today for physical therapy evaluation and treatment for chronic L greater trochanteric pain syndrome.  Symptoms have been present for the past 26 years - since pregnancy and birth of her son. She reports significant improvement in lateral L hip and LE symptoms with shock wave therapy and has one additional appointment scheduled. Patient presents with decreased L hip mobility and strength; muscular tightness as noted above; core weakness; pelvic floor dysfunction evidenced by pattern of muscular tightness/dysfunction and pain as well as bowel and bladder changes. She has intermittent radicular tingling and numbness in the lateral L hip and thigh to knee. She has sedentary lifestyle and poor posture and alignment with leisure activity primarily watching TV. Patient will benefit from PT to address problems identified.   OBJECTIVE IMPAIRMENTS: Abnormal gait, decreased activity tolerance, decreased ROM, decreased strength, increased fascial restrictions, postural dysfunction, and pain.   ACTIVITY LIMITATIONS: standing, sleeping, and locomotion level  PARTICIPATION LIMITATIONS: cleaning, community activity, and occupation  PERSONAL FACTORS: Fitness, Past/current experiences, Profession, Time since onset of injury/illness/exacerbation, and comorbidities as noted above are also affecting patient's functional outcome.   REHAB POTENTIAL: Good  CLINICAL  DECISION MAKING: Evolving/moderate complexity  EVALUATION COMPLEXITY: Moderate   GOALS: Goals reviewed with patient? Yes  SHORT TERM GOALS: Target date: 11/20/2024   Independent in initial HEP  Baseline: Goal status: INITIAL  2.  Improve core strength with patient to demonstrate good transverse abdominal activation Baseline:  Goal status: INITIAL  3.  Patient verbalizes and demonstrates better posture and alignment for sitting and standing  Baseline:  Goal status: INITIAL  4.  Assessment of L ankle pain as time allows  Baseline:  Goal status: INITIAL   LONG TERM GOALS: Target date: 12/18/2024   Increase L LE strength to 5/5  Baseline:   Goal status: INITIAL  2.  Decrease L hip pain to 0/10 for functional and work tasks at least 75% of the day  Baseline:  Goal status: INITIAL  3.  Patient demonstrates appropriate myofacial release techniques to assist in management of chronic muscular tightness and pain  Baseline:  Goal status: INITIAL  4.  Improve LEFS score by 10% indicating improved functional activity tolerance  Baseline: 63/80; 78.8%  Goal status: INITIAL  5.  Independent in advance HEP  Baseline:  Goal status: INITIAL  6.  Encourage patient to participate in a regular exercise program at least 3 days/week  Baseline:  Goal status: INITIAL     PLAN:  PT FREQUENCY: 2x/week  PT DURATION: 8 weeks  PLANNED INTERVENTIONS: 97164- PT Re-evaluation, 97110-Therapeutic exercises, 97530- Therapeutic activity, 97112- Neuromuscular re-education, 97535- Self Care, [id number]- Manual therapy, [phone]- Gait training, [phone]- Aquatic Therapy, [phone]- Ionotophoresis 4mg /ml Dexamethasone , 79439 (1-2 muscles), [id number] (3+ muscles)- Dry Needling, Patient/Family education, Balance training, Stair training, Taping, and Joint mobilization  PLAN FOR NEXT SESSION: review and progress with exercises; ergonomic and postural/positional education and recommendations; manual work and modalities as indicated   Lucie Meeter, PT, DPT, ATC 11/03/2024 11:58 AM  "

## 2024-11-07 ENCOUNTER — Encounter: Payer: Self-pay | Admitting: Internal Medicine

## 2024-11-17 LAB — BASIC METABOLIC PANEL WITH GFR
BUN/Creatinine Ratio: 16 (ref 12–28)
BUN: 9 mg/dL (ref 8–27)
CO2: 25 mmol/L (ref 20–29)
Calcium: 9.1 mg/dL (ref 8.7–10.3)
Chloride: 99 mmol/L (ref 96–106)
Creatinine, Ser: 0.58 mg/dL (ref 0.57–1.00)
Glucose: 93 mg/dL (ref 70–99)
Potassium: 4.1 mmol/L (ref 3.5–5.2)
Sodium: 137 mmol/L (ref 134–144)
eGFR: 104 mL/min/1.73

## 2024-11-20 ENCOUNTER — Ambulatory Visit: Payer: Self-pay | Admitting: Cardiology

## 2024-12-29 ENCOUNTER — Ambulatory Visit: Admitting: Cardiology

## 2025-07-19 ENCOUNTER — Encounter: Admitting: Family Medicine
